# Patient Record
Sex: Female | Born: 1995 | Race: White | Hispanic: No | Marital: Married | Smoking: Never smoker
Health system: Southern US, Community
[De-identification: ages and names within clinical notes are randomized; demographics above are authoritative.]

## PROBLEM LIST (undated history)

## (undated) DIAGNOSIS — G909 Disorder of the autonomic nervous system, unspecified: Secondary | ICD-10-CM

## (undated) DIAGNOSIS — R161 Splenomegaly, not elsewhere classified: Secondary | ICD-10-CM

## (undated) DIAGNOSIS — F419 Anxiety disorder, unspecified: Secondary | ICD-10-CM

## (undated) DIAGNOSIS — O24419 Gestational diabetes mellitus in pregnancy, unspecified control: Secondary | ICD-10-CM

## (undated) DIAGNOSIS — I498 Other specified cardiac arrhythmias: Secondary | ICD-10-CM

## (undated) DIAGNOSIS — O139 Gestational [pregnancy-induced] hypertension without significant proteinuria, unspecified trimester: Secondary | ICD-10-CM

## (undated) DIAGNOSIS — R55 Syncope and collapse: Secondary | ICD-10-CM

## (undated) DIAGNOSIS — F5000 Anorexia nervosa, unspecified: Secondary | ICD-10-CM

## (undated) DIAGNOSIS — F32A Depression, unspecified: Secondary | ICD-10-CM

## (undated) DIAGNOSIS — R000 Tachycardia, unspecified: Secondary | ICD-10-CM

## (undated) DIAGNOSIS — E282 Polycystic ovarian syndrome: Secondary | ICD-10-CM

## (undated) DIAGNOSIS — N390 Urinary tract infection, site not specified: Secondary | ICD-10-CM

## (undated) DIAGNOSIS — G43909 Migraine, unspecified, not intractable, without status migrainosus: Secondary | ICD-10-CM

## (undated) DIAGNOSIS — I951 Orthostatic hypotension: Secondary | ICD-10-CM

## (undated) DIAGNOSIS — N200 Calculus of kidney: Secondary | ICD-10-CM

## (undated) DIAGNOSIS — F4323 Adjustment disorder with mixed anxiety and depressed mood: Secondary | ICD-10-CM

## (undated) DIAGNOSIS — G90A Postural orthostatic tachycardia syndrome (POTS): Secondary | ICD-10-CM

## (undated) HISTORY — DX: Migraine, unspecified, not intractable, without status migrainosus: G43.909

## (undated) HISTORY — DX: Other specified cardiac arrhythmias: I49.8

## (undated) HISTORY — DX: Orthostatic hypotension: I95.1

## (undated) HISTORY — DX: Syncope and collapse: R55

## (undated) HISTORY — PX: WISDOM TOOTH EXTRACTION: SHX21

## (undated) HISTORY — DX: Tachycardia, unspecified: R00.0

## (undated) HISTORY — DX: Postural orthostatic tachycardia syndrome (POTS): G90.A

---
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
## 2014-01-26 ENCOUNTER — Other Ambulatory Visit: Payer: Self-pay | Admitting: Family

## 2014-01-26 DIAGNOSIS — G43009 Migraine without aura, not intractable, without status migrainosus: Secondary | ICD-10-CM

## 2014-07-08 ENCOUNTER — Other Ambulatory Visit: Payer: Self-pay | Admitting: Family

## 2014-08-06 ENCOUNTER — Other Ambulatory Visit: Payer: Self-pay | Admitting: Family

## 2014-08-20 ENCOUNTER — Ambulatory Visit: Payer: BC Managed Care – PPO | Admitting: Pediatrics

## 2014-09-15 ENCOUNTER — Ambulatory Visit: Payer: Self-pay | Admitting: Pediatrics

## 2014-09-22 ENCOUNTER — Ambulatory Visit (INDEPENDENT_AMBULATORY_CARE_PROVIDER_SITE_OTHER): Payer: BLUE CROSS/BLUE SHIELD | Admitting: Pediatrics

## 2014-09-22 ENCOUNTER — Encounter: Payer: Self-pay | Admitting: Pediatrics

## 2014-09-22 ENCOUNTER — Other Ambulatory Visit: Payer: Self-pay

## 2014-09-22 VITALS — BP 110/72 | HR 64 | Ht 65.5 in | Wt 118.8 lb

## 2014-09-22 DIAGNOSIS — R55 Syncope and collapse: Secondary | ICD-10-CM

## 2014-09-22 DIAGNOSIS — R000 Tachycardia, unspecified: Secondary | ICD-10-CM

## 2014-09-22 DIAGNOSIS — G43009 Migraine without aura, not intractable, without status migrainosus: Secondary | ICD-10-CM | POA: Diagnosis not present

## 2014-09-22 DIAGNOSIS — G90A Postural orthostatic tachycardia syndrome (POTS): Secondary | ICD-10-CM

## 2014-09-22 DIAGNOSIS — I951 Orthostatic hypotension: Secondary | ICD-10-CM

## 2014-09-22 HISTORY — DX: Syncope and collapse: R55

## 2014-09-22 MED ORDER — TOPIRAMATE 25 MG PO TABS: ORAL_TABLET | ORAL | Status: DC

## 2014-09-22 NOTE — Progress Notes (Signed)
Patient: Jessica [name] MRN: [id number] Sex: female DOB: 04/20/1996  Provider: Deetta Perla, MD Location of Care: [facility]  Note type: Routine return visit  History of Present Illness: Referral Source: Dr. [name] History from: mother, patient and [facility] chart Chief Complaint: Migraines  Jessica [name] is a 19 y.o. female who returns on September 22, 2014 for the 1st time since December 31, 2013.  She is in her first year at [employer] and has done well.  She is followed for migraine without aura and vasovagal syncope with orthostatic intolerance that is similar to POTS.  She has been treated with a combination of hydration, exercise, elevation of the head of her bed, atenolol, midodrine, which has largely brought her symptoms under control.  In the interim since her last visit, she has experienced no migraines and no incapacitating headaches.  Her last episode of syncope occurred last week.  She was sitting in her room, got up from her chair and awakened on the floor.  The episode occurred without warning, which is distinctly unusual.  She hit her chin on the desk as she was falling.  She was by herself and so this was unwitnessed.  Previous episodes happened on New Year's Eve; before that January 24, 2014. Those were with warning.  She has abdominal discomfort that is crampy and sharp.  It involves the left and right periumbilical regions and persists for about an hour.  On occasion, she has nausea.  She changed her diet to gluten-free, which helped a bit, but has not eliminated her symptoms.  She also has problems with constipation.  She continues to take topiramate daily and has not required Imitrex as a rescue medication.  She has been off midodrine for a year.  The mainstays of her orthostatic treatment include fludrocortisone and atenolol.  She also takes a daily iron tablet.  She is in a pre-nursing program.  She may transfer from [employer] to [employer].  Overall despite her  medical problems, her health has been good.  No other concerns were raised today.  Review of Systems: 12 system review was unremarkable  Past Medical History Diagnosis Date  . Generalized headaches   . Orthostatic hypotension   . Other specified cardiac dysrhythmias(427.89)   . Episodic tension type headache    Hospitalizations: No., Head Injury: No., Nervous System Infections: No., Immunizations up to date: Yes.    She had onset of headaches, some of them migraines beginning in the 4th or 5th grade. These involve stabbing left-sided pain that lasts for an hour before easing. The episodes are recurrent and are associated with nausea and vomiting, left sided throbbing, no sensitivity to light, sound, or movement. She recently had at least one episode of 10-15 minutes of tingling in her face and fingers associated with headaches.   She also had episodes of what appeared to be positional vertigo, but became syncope. She was evaluated by Dr. Darlis Loan on April 08, 2012. He noted a previous evaluation, June 22, 2011 for syncope. EKG and echocardiogram were normal. 24-hour Holter monitor was normal. She had persistent episodes of tachycardia and dizziness following the evaluation. She had initial improvement with Florinef; however, on 0.3 mg per day, she did not have persistent improvement. She complained of three episodes of loss of consciousness per month. On March 04, 2012, she had an episode of stiffening which was unusual. A decision was made to place her on atenolol when her pulse went up from  68 to 105 from supine to standing.  Behavior History none  Surgical History Procedure Laterality Date  . Eye surgery  2000    clogged tear duct  . Anterior cruciate ligament repair  03/2010   Family History family history includes Mental retardation in her other; Migraines in her maternal aunt, maternal aunt, maternal uncle, maternal uncle, and mother; Other in her other; Seizures in her  father and mother; Stroke in her maternal grandmother. Family history is negative for intellectual disabilities, blindness, deafness, birth defects, chromosomal disorder, or autism.  Social History . Marital Status: Single    Spouse Name: N/A  . Number of Children: N/A  . Years of Education: N/A   Social History Main Topics  . Smoking status: Never Smoker   . Smokeless tobacco: Never Used  . Alcohol Use: No  . Drug Use: No  . Sexual Activity: No   Social History Narrative  Educational level university School Attending: [employer]  Occupation: Student  Living with [name] lives in a dorm with other students during the week. She goes home on the weekends.   Hobbies/Interest: She enjoys watching Netflix when she has spare time.  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 She is studying nursing, and is earning all A's.  Allergies Allergen Reactions  . Amoxicillin Rash   Physical Exam BP 110/72 mmHg  Pulse 64  Ht 5' 5.5" (1.664 m)  Wt 118 lb 12.8 oz (53.887 kg)  BMI 19.46 kg/m2  LMP 09/22/2014 (Exact Date)  General: alert, well developed, well nourished, in no acute distress, brown hair, brown eyes, right handed Head: normocephalic, no dysmorphic features Ears, Nose and Throat: Otoscopic: tympanic membranes normal; pharynx: oropharynx is pink without exudates or tonsillar hypertrophy Neck: supple, full range of motion, no cranial or cervical bruits Respiratory: auscultation clear Cardiovascular: no murmurs, pulses are normal Musculoskeletal: no skeletal deformities or apparent scoliosis Skin: no rashes or neurocutaneous lesions  Neurologic Exam  Mental Status: alert; oriented to person, place and year; knowledge is normal for age; language is normal Cranial Nerves: visual fields are full to double simultaneous stimuli; extraocular movements are full and conjugate; pupils are round reactive to light; funduscopic examination shows sharp disc margins with normal vessels; symmetric  facial strength; midline tongue and uvula; air conduction is greater than bone conduction bilaterally Motor: Normal strength, tone and mass; good fine motor movements; no pronator drift Sensory: intact responses to cold, vibration, proprioception and stereognosis Coordination: good finger-to-nose, rapid repetitive alternating movements and finger apposition Gait and Station: normal gait and station: patient is able to walk on heels, toes and tandem without difficulty; balance is adequate; Romberg exam is negative; Gower response is negative Reflexes: symmetric and diminished bilaterally; no clonus; bilateral flexor plantar responses  Assessment 1. Migraine without aura, not intractable, without status migrainosus, G43.009. 2. Postural orthostatic tachycardia syndrome, R00.0, I95.1 . 3. Syncope, R55.  Discussion The patient has been stable with occasional episodes.  The one that concerns me to see episode that occurred without warning.  This is distinctly different from her other episodes and if it recurs, further evaluation may be necessary including an EEG.  Plan She will continue topiramate for the next six months.  I will see her in followup at that time, we may decide to taper and discontinue topiramate over a period of several weeks.  If her headaches recur; however, she will remain on the medication.  Though she has experienced some episodes of syncope, I would not make change in  her treatment now.  She will return in six months for followup.  I spent 30 minutes of face-to-face time with Tiffny and her mother, more than half of it in consultation.   Medication List   This list is accurate as of: 09/22/14 11:59 PM.       atenolol 25 MG tablet  Commonly known as:  TENORMIN  1 tablet in the morning and one half tablet at nighttime     fludrocortisone 0.1 MG tablet  Commonly known as:  FLORINEF  Take 3 tablets by mouth daily     GILDESS FE 1/20 1-20 MG-MCG tablet  Generic drug:   norethindrone-ethinyl estradiol     ibuprofen 200 MG tablet  Commonly known as:  ADVIL,MOTRIN  Take 400 mg by mouth every 8 (eight) hours as needed. For headache     phenylephrine 10 MG Tabs tablet  Commonly known as:  SUDAFED PE  Take 10 mg by mouth 2 (two) times daily as needed. For sinus pressure.     SUMAtriptan 25 MG tablet  Commonly known as:  IMITREX  Take 25 mg by mouth. Taken onset of migraine with 400 mg of ibuprofen a repeat in 2 hours as needed.     topiramate 25 MG tablet  Commonly known as:  TOPAMAX  Take 3 tablets by mouth daily at bedtime.     ZYRTEC PO  Take 1 tablet by mouth daily.      The medication list was reviewed and reconciled. All changes or newly prescribed medications were explained.  A complete medication list was provided to the patient/caregiver.  Deetta Perla MD

## 2014-09-22 NOTE — Patient Instructions (Signed)
Please call our office if you have further episodes of syncope.

## 2014-09-26 DIAGNOSIS — G90A Postural orthostatic tachycardia syndrome (POTS): Secondary | ICD-10-CM | POA: Insufficient documentation

## 2014-09-26 DIAGNOSIS — R000 Tachycardia, unspecified: Secondary | ICD-10-CM

## 2014-09-26 DIAGNOSIS — I951 Orthostatic hypotension: Secondary | ICD-10-CM

## 2014-10-02 ENCOUNTER — Other Ambulatory Visit: Payer: Self-pay | Admitting: Family

## 2014-10-16 ENCOUNTER — Telehealth: Payer: Self-pay

## 2014-10-16 ENCOUNTER — Ambulatory Visit (INDEPENDENT_AMBULATORY_CARE_PROVIDER_SITE_OTHER): Payer: BLUE CROSS/BLUE SHIELD | Admitting: Urgent Care

## 2014-10-16 VITALS — BP 98/72 | HR 65 | Temp 98.0°F | Resp 16 | Ht 65.5 in | Wt 121.0 lb

## 2014-10-16 DIAGNOSIS — J029 Acute pharyngitis, unspecified: Secondary | ICD-10-CM

## 2014-10-16 DIAGNOSIS — R5383 Other fatigue: Secondary | ICD-10-CM

## 2014-10-16 DIAGNOSIS — R52 Pain, unspecified: Secondary | ICD-10-CM | POA: Diagnosis not present

## 2014-10-16 DIAGNOSIS — R509 Fever, unspecified: Secondary | ICD-10-CM

## 2014-10-16 LAB — POCT CBC
GRANULOCYTE PERCENT: 66.2 % (ref 37–80)
HCT, POC: 38.6 % (ref 37.7–47.9)
Hemoglobin: 11.8 g/dL — AB (ref 12.2–16.2)
Lymph, poc: 2.6 (ref 0.6–3.4)
MCH: 28.8 pg (ref 27–31.2)
MCHC: 30.7 g/dL — AB (ref 31.8–35.4)
MCV: 94 fL (ref 80–97)
MID (CBC): 0.4 (ref 0–0.9)
MPV: 8.6 fL (ref 0–99.8)
POC Granulocyte: 5.8 (ref 2–6.9)
POC LYMPH PERCENT: 29.4 %L (ref 10–50)
POC MID %: 4.4 % (ref 0–12)
Platelet Count, POC: 276 10*3/uL (ref 142–424)
RBC: 4.1 M/uL (ref 4.04–5.48)
RDW, POC: 12.6 %
WBC: 8.8 10*3/uL (ref 4.6–10.2)

## 2014-10-16 LAB — POCT RAPID STREP A (OFFICE): RAPID STREP A SCREEN: NEGATIVE

## 2014-10-16 NOTE — Patient Instructions (Addendum)
- I will call your mother with your results and plan for follow up. - For now continue using ibuprofen for fever, warmed honey for sore throat, Sudafed for nasal congestion, Zyrtec in case seasonal allergies flare up. Drink plenty of water, between 32-64 ounces daily.  Fever, Adult A fever is a higher than normal body temperature. In an adult, an oral temperature around 98.6 F (37 C) is considered normal. A temperature of 100.4 F (38 C) or higher is generally considered a fever. Mild or moderate fevers generally have no long-term effects and often do not require treatment. Extreme fever (greater than or equal to 106 F or 41.1 C) can cause seizures. The sweating that may occur with repeated or prolonged fever may cause dehydration. Elderly people can develop confusion during a fever. A measured temperature can vary with:  Age.  Time of day.  Method of measurement (mouth, underarm, rectal, or ear). The fever is confirmed by taking a temperature with a thermometer. Temperatures can be taken different ways. Some methods are accurate and some are not.  An oral temperature is used most commonly. Electronic thermometers are fast and accurate.  An ear temperature will only be accurate if the thermometer is positioned as recommended by the manufacturer.  A rectal temperature is accurate and done for those adults who have a condition where an oral temperature cannot be taken.  An underarm (axillary) temperature is not accurate and not recommended. Fever is a symptom, not a disease.  CAUSES   Infections commonly cause fever.  Some noninfectious causes for fever include:  Some arthritis conditions.  Some thyroid or adrenal gland conditions.  Some immune system conditions.  Some types of cancer.  A medicine reaction.  High doses of certain street drugs such as methamphetamine.  Dehydration.  Exposure to high outside or room temperatures.  Occasionally, the source of a fever  cannot be determined. This is sometimes called a "fever of unknown origin" (FUO).  Some situations may lead to a temporary rise in body temperature that may go away on its own. Examples are:  Childbirth.  Surgery.  Intense exercise. HOME CARE INSTRUCTIONS   Take appropriate medicines for fever. Follow dosing instructions carefully. If you use acetaminophen to reduce the fever, be careful to avoid taking other medicines that also contain acetaminophen. Do not take aspirin for a fever if you are younger than age 46. There is an association with Reye's syndrome. Reye's syndrome is a rare but potentially deadly disease.  If an infection is present and antibiotics have been prescribed, take them as directed. Finish them even if you start to feel better.  Rest as needed.  Maintain an adequate fluid intake. To prevent dehydration during an illness with prolonged or recurrent fever, you may need to drink extra fluid.Drink enough fluids to keep your urine clear or pale yellow.  Sponging or bathing with room temperature water may help reduce body temperature. Do not use ice water or alcohol sponge baths.  Dress comfortably, but do not over-bundle. SEEK MEDICAL CARE IF:   You are unable to keep fluids down.  You develop vomiting or diarrhea.  You are not feeling at least partly better after 3 days.  You develop new symptoms or problems. SEEK IMMEDIATE MEDICAL CARE IF:   You have shortness of breath or trouble breathing.  You develop excessive weakness.  You are dizzy or you faint.  You are extremely thirsty or you are making little or no urine.  You develop new  pain that was not there before (such as in the head, neck, chest, back, or abdomen).  You have persistent vomiting and diarrhea for more than 1 to 2 days.  You develop a stiff neck or your eyes become sensitive to light.  You develop a skin rash.  You have a fever or persistent symptoms for more than 2 to 3 days.  You  have a fever and your symptoms suddenly get worse. MAKE SURE YOU:   Understand these instructions.  Will watch your condition.  Will get help right away if you are not doing well or get worse. Document Released: 01/03/2001 Document Revised: 11/24/2013 Document Reviewed: 05/11/2011 [facility] Patient Information 2015 [employer] [place]. This information is not intended to replace advice given to you by your health care provider. Make sure you discuss any questions you have with your health care provider.

## 2014-10-16 NOTE — Telephone Encounter (Signed)
Jessica [name]   Mom called to say patient had been bitten by something the day before she had the fever.  It swelled up large as a quarter, then went back down.   Relayed the following verbal from HaringMani to mom.  If patient develops a rash, she needs to return to clinic.  Otherwise, wait for the culture.  She understands.

## 2014-10-16 NOTE — Progress Notes (Signed)
MRN: [id number] DOB: 1996/03/30  Subjective:   [name] is a 19 y.o. female presenting for chief complaint of Fever; Sore Throat; and Fatigue  Reports 1 week history of fever (101F), fatigue, malaise, intermittent headache, body aches; sore throat started yesterday. Has tried ibuprofen regularly with some relief, takes Sudafed as needed for congestion, has tried this intermittently with some relief. 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 Denies itchy or watery red eyes, sinus pain, sinus congestion, ear pain, ear drainage, tooth pain, cough, chest pain, chest tightness, shob, wheezing, n/v, abdominal pain. Has history of seasonal allergies, not currently taking Zyrtec. Denies history of asthma. Denies smoking or alcohol use. Denies any other aggravating or relieving factors, no other questions or concerns.  [name] has a current medication list which includes the following prescription(s): atenolol, fludrocortisone, gildess fe 1/20, ibuprofen, phenylephrine, sumatriptan, topiramate, cetirizine hcl, and topiramate. She is allergic to amoxicillin.  [name]  has a past medical history of Generalized headaches; Orthostatic hypotension; Other specified cardiac dysrhythmias(427.89); and Episodic tension type headache. Also  has past surgical history that includes Eye surgery (2000); Anterior cruciate ligament repair (03/2010); and Wisdom tooth extraction.  ROS As in subjective.  Objective:   Vitals: BP 98/72 mmHg  Pulse 65  Temp(Src) 98 F (36.7 C) (Oral)  Resp 16  Ht 5' 5.5" (1.664 m)  Wt 121 lb (54.885 kg)  BMI 19.82 kg/m2  SpO2 100%  LMP 09/22/2014  Physical Exam  Constitutional: She is oriented to person, place, and time and well-developed, well-nourished, and in no distress.  HENT:  TM's flat bilaterally, no effusions or erythema. Nasal turbinates pink and moist with slight clear rhinorrhea. No sinus tenderness. Slight postnasal drip present,  without oropharyngeal exudates, erythema, tonsillar enlargement or abscesses.  Eyes: Conjunctivae are normal. Right eye exhibits no discharge. Left eye exhibits no discharge. No scleral icterus.  Neck: Normal range of motion.  Cardiovascular: Normal rate, regular rhythm and intact distal pulses.  Exam reveals no gallop and no friction rub.   No murmur heard. Pulmonary/Chest: No stridor. No respiratory distress. She has no wheezes. She has no rales. She exhibits no tenderness.  Abdominal: Soft. Bowel sounds are normal. She exhibits no distension and no mass. There is no tenderness.  No hepatosplenomegaly.  Lymphadenopathy:    She has no cervical adenopathy.  Neurological: She is alert and oriented to person, place, and time.   Results for orders placed or performed in visit on 10/16/14 (from the past 24 hour(s))  POCT rapid strep A     Status: None   Collection Time: 10/16/14  4:26 PM  Result Value Ref Range   Rapid Strep A Screen Negative Negative  POCT CBC     Status: Abnormal   Collection Time: 10/16/14  4:37 PM  Result Value Ref Range   WBC 8.8 4.6 - 10.2 K/uL   Lymph, poc 2.6 0.6 - 3.4   POC LYMPH PERCENT 29.4 10 - 50 %L   MID (cbc) 0.4 0 - 0.9   POC MID % 4.4 0 - 12 %M   POC Granulocyte 5.8 2 - 6.9   Granulocyte percent 66.2 37 - 80 %G   RBC 4.10 4.04 - 5.48 M/uL   Hemoglobin 11.8 (A) 12.2 - 16.2 g/dL   HCT, POC 40.938.6 81.137.7 - 47.9 %   MCV 94.0 80 - 97 fL   MCH, POC 28.8 27 - 31.2 pg   MCHC 30.7 (A) 31.8 -  35.4 g/dL   RDW, POC 16.1 %   Platelet Count, POC 276 142 - 424 K/uL   MPV 8.6 0 - 99.8 fL   Assessment and Plan :   1. Fever, unspecified fever cause 2. Sore throat 3. Body aches 4. Other fatigue - Unclear etiology, labs pending, advised supportive care, patient would like to have her mother called with results and plan for follow up.  Wallis Bamberg, PA-C [facility] [phone] 10/16/2014 3:57 PM

## 2014-10-18 LAB — CULTURE, GROUP A STREP: Organism ID, Bacteria: NORMAL

## 2014-10-19 LAB — EPSTEIN-BARR VIRUS VCA ANTIBODY PANEL
EBV EA IGG: 6.4 U/mL (ref <9.0)
EBV NA IgG: <3 U/mL (ref <18.0)
EBV VCA IgG: 43.2 U/mL — ABNORMAL HIGH (ref <18.0)

## 2014-10-20 ENCOUNTER — Telehealth: Payer: Self-pay | Admitting: Urgent Care

## 2014-10-20 NOTE — Telephone Encounter (Signed)
Discussed case with Dr. Nilda SimmerKristi Smith. Will continue with NSAIDs, hydration as patient is likely undergoing viral syndrome. Advised to repeat labs in 2-3 days if symptoms persist.  Wallis BambergMario Deliana Avalos, PA-C [facility] [phone] 10/20/2014  2:59 PM

## 2014-10-20 NOTE — Telephone Encounter (Signed)
Reported negative Strep and mono results to patient's mother. Patient's mother states that patient is still having low grade fevers despite taking 400mg  of ibuprofen 6-8 hours apart. Temperature is hovering ~100F. Patient is also still having scratchy throat. Denies chest pain, shob, wheezing, n/v, abdominal pain, throat pain. Has a history of seasonal allergies, taking Zyrtec daily for this. 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 Has been stable as of late. Also, reports that patient had an insect bite about 2 weeks ago. This has since resolved without any rashes.  Jessica BambergMario Alsie Younes, PA-C [facility] [phone] 10/20/2014  1:59 PM

## 2014-10-22 ENCOUNTER — Other Ambulatory Visit: Payer: Self-pay | Admitting: Pediatrics

## 2014-10-22 ENCOUNTER — Telehealth: Payer: Self-pay | Admitting: Family

## 2014-10-22 NOTE — Telephone Encounter (Signed)
Mom Bonnita LevanJoy Thomas left message about Kiwanna. Mom said that she is having problems with lingering headache for 2 weeks, but headache didn't feel like a migraine until today. She has been sick and been taking Motrin every day for viral illness with fever and upper respiratory symptoms for past 2 weeks. Mom said that [name] is in college and has been pushing herself to go to class and do work. Mom wondered if she is now "immune" to the Motrin since she has been taking it for a week and wonders if trying Tylenol would give her relief of headache. She has Sumatriptan but can't take it if she has to study because it makes her tired and sleepy.  Mom asked for call back at [phone]. I called Mom and talked with her about Cataleya's headache. I explained that since she has had headache and has been taking Motrin for 2 weeks, that more oral analgesics are not likely to help. I told her that Shonita should go to ER to receive migraine cocktail to try to break the headache if the headache continues at migraine level today with no relief. Mom agreed with this and will talk to Botswanaiara about coming home to parents where she can get more rest (Enyah lives in dorm at [employer]) while headache is severe and talk to her about considering going to ER. TG

## 2014-10-23 NOTE — Telephone Encounter (Signed)
Thank you, I agree with this advice and plan.

## 2014-10-26 ENCOUNTER — Encounter (HOSPITAL_BASED_OUTPATIENT_CLINIC_OR_DEPARTMENT_OTHER): Payer: Self-pay

## 2014-10-26 ENCOUNTER — Emergency Department (HOSPITAL_BASED_OUTPATIENT_CLINIC_OR_DEPARTMENT_OTHER)
Admission: EM | Admit: 2014-10-26 | Discharge: 2014-10-26 | Disposition: A | Discharge status: Home / Self Care | Payer: BLUE CROSS/BLUE SHIELD | Attending: Emergency Medicine | Admitting: Emergency Medicine

## 2014-10-26 DIAGNOSIS — Z7951 Long term (current) use of inhaled steroids: Secondary | ICD-10-CM | POA: Diagnosis not present

## 2014-10-26 DIAGNOSIS — Z880 Allergy status to penicillin: Secondary | ICD-10-CM | POA: Insufficient documentation

## 2014-10-26 DIAGNOSIS — G43901 Migraine, unspecified, not intractable, with status migrainosus: Secondary | ICD-10-CM | POA: Diagnosis not present

## 2014-10-26 DIAGNOSIS — Z8679 Personal history of other diseases of the circulatory system: Secondary | ICD-10-CM | POA: Insufficient documentation

## 2014-10-26 DIAGNOSIS — Z79899 Other long term (current) drug therapy: Secondary | ICD-10-CM | POA: Insufficient documentation

## 2014-10-26 MED ORDER — SODIUM CHLORIDE 0.9 % IV BOLUS (SEPSIS)
2000.0000 mL | Freq: Once | INTRAVENOUS | Status: AC
  Administered 2014-10-26: 1000 mL via INTRAVENOUS

## 2014-10-26 MED ORDER — KETOROLAC TROMETHAMINE 30 MG/ML IJ SOLN
30.0000 mg | Freq: Once | INTRAMUSCULAR | Status: AC
  Administered 2014-10-26: 30 mg via INTRAVENOUS
  Filled 2014-10-26: qty 1

## 2014-10-26 MED ORDER — DIPHENHYDRAMINE HCL 50 MG/ML IJ SOLN
25.0000 mg | Freq: Once | INTRAMUSCULAR | Status: AC
  Administered 2014-10-26: 25 mg via INTRAVENOUS
  Filled 2014-10-26: qty 1

## 2014-10-26 MED ORDER — DEXAMETHASONE SODIUM PHOSPHATE 10 MG/ML IJ SOLN
10.0000 mg | Freq: Once | INTRAMUSCULAR | Status: AC
  Administered 2014-10-26: 10 mg via INTRAVENOUS
  Filled 2014-10-26: qty 1

## 2014-10-26 MED ORDER — PROCHLORPERAZINE EDISYLATE 5 MG/ML IJ SOLN
10.0000 mg | Freq: Once | INTRAMUSCULAR | Status: AC
  Administered 2014-10-26: 10 mg via INTRAVENOUS
  Filled 2014-10-26: qty 2

## 2014-10-26 NOTE — ED Provider Notes (Signed)
CSN: [id number]     Arrival date & time 10/26/14  1520 History   First MD Initiated Contact with Patient 10/26/14 1612     Chief Complaint  Patient presents with  . Migraine     (Consider location/radiation/quality/duration/timing/severity/associated sxs/prior Treatment) HPI Patient presents to the emergency department with migraine headache for the last 2 weeks.  Patient states this feels similar to previous migraines, but has lasted longer than normal.  Patient states that he did get some better this past Friday began worse over the weekend.  She spoke with her neurologist, who advised her to come to the emergency department.  Patient denies blurred vision, weakness, numbness, dizziness, back pain, neck pain, cough, runny nose, sore throat, chest pain, shortness of breath, fever, rash, near syncope or syncope.  The patient states that she did have a viral illness just prior to the onset of a migraine headache Past Medical History  Diagnosis Date  . Generalized headaches   . Orthostatic hypotension   . Other specified cardiac dysrhythmias(427.89)   . Episodic tension type headache    Past Surgical History  Procedure Laterality Date  . Eye surgery  2000    clogged tear duct  . Anterior cruciate ligament repair  03/2010  . Wisdom tooth extraction     Family History  Problem Relation Age of Onset  . Migraines Mother     Started 4th or 5th grade  . Seizures Mother     Febrile Seizures as a child  . Seizures Father     Febrile Seizures as a child  . Migraines Maternal Aunt   . Migraines Maternal Uncle   . Migraines Maternal Aunt   . Migraines Maternal Uncle   . Mental retardation Other     Maternal Second Cousin  . Other Other     Maternal Great Uncle had some sort of Retinal Vessel Occlusion  . Stroke Maternal Grandmother     mini-stroke   History  Substance Use Topics  . Smoking status: Never Smoker   . Smokeless tobacco: Never Used  . Alcohol Use: No   OB History     No data available     Review of Systems  All other systems negative except as documented in the HPI. All pertinent positives and negatives as reviewed in the HPI.  Allergies  Amoxicillin  Home Medications   Prior to Admission medications   Medication Sig Start Date End Date Taking? Authorizing Provider  atenolol (TENORMIN) 25 MG tablet 1 tablet in the morning and one half tablet at nighttime 10/04/12   Historical Provider, MD  Cetirizine HCl (ZYRTEC PO) Take 1 tablet by mouth daily.      Historical Provider, MD  fludrocortisone (FLORINEF) 0.1 MG tablet Take 3 tablets by mouth daily 07/22/13   Princella Ionina P Goodpasture, NP  GILDESS FE 1/20 1-20 MG-MCG tablet  09/21/14   Historical Provider, MD  ibuprofen (ADVIL,MOTRIN) 200 MG tablet Take 400 mg by mouth every 8 (eight) hours as needed. For headache     Historical Provider, MD  phenylephrine (SUDAFED PE) 10 MG TABS Take 10 mg by mouth 2 (two) times daily as needed. For sinus pressure.     Historical Provider, MD  SUMAtriptan (IMITREX) 25 MG tablet TAKE ONE AT ONSET OF MIGRAINE WITH 400 MG OF IBUPROFEN 10/22/14   Princella Ionina P Goodpasture, NP  topiramate (TOPAMAX) 25 MG tablet Take 3 tablets by mouth daily at bedtime. 09/22/14   Deetta PerlaWilliam H Hickling, MD  topiramate (TOPAMAX) 25  MG tablet TAKE 3 TABLETS BY MOUTH DAILY AT BEDTIME. 10/02/14   Shirlean Mylar Goodpasture, NP   BP 113/73 mmHg  Pulse 69  Temp(Src) 98.3 F (36.8 C) (Oral)  Resp 18  Ht  (1.651 m)  Wt 120 lb (54.432 kg)  BMI 19.97 kg/m2  SpO2 100%  LMP 10/19/2014 Physical Exam  Constitutional: She is oriented to person, place, and time. She appears well-developed and well-nourished. No distress.  HENT:  Head: Normocephalic and atraumatic.  Mouth/Throat: Oropharynx is clear and moist.  Eyes: Pupils are equal, round, and reactive to light.  Neck: Normal range of motion. Neck supple.  Cardiovascular: Normal rate, regular rhythm and normal heart sounds.  Exam reveals no gallop and no friction rub.    No murmur heard. Pulmonary/Chest: Effort normal and breath sounds normal. No respiratory distress.  Neurological: She is alert and oriented to person, place, and time. She exhibits normal muscle tone. Coordination normal.  Skin: Skin is warm and dry. No rash noted. No erythema.  Nursing note and vitals reviewed.   ED Course  Procedures (including critical care time)  Patient is feeling better following IV fluids, Toradol, Decadron, Compazine and Benadryl.  The patient states her headache is completely gone but is most of the way gone and feels better than it has in 2 weeks.  I advised her to call her neurologist for recheck as soon as possible.  Return here as needed  Charlestine Night, PA-C 10/26/14 1954  Rolan Bucco, MD 10/27/14 [phone]

## 2014-10-26 NOTE — ED Notes (Signed)
PA at bedside.

## 2014-10-26 NOTE — ED Notes (Signed)
Second bag of IV  NS going to make 2000 of NS total

## 2014-10-26 NOTE — ED Notes (Signed)
C/o migraine x 2 weeks 

## 2014-10-26 NOTE — Discharge Instructions (Signed)
Return here as needed.  Follow-up with your neurologist increase her fluid intake

## 2015-02-22 ENCOUNTER — Telehealth: Payer: Self-pay | Admitting: Pediatrics

## 2015-02-22 NOTE — Telephone Encounter (Signed)
Jessica [name] is having increasing frequency of migraines although she has not sent any headache calendars.  There is some concern about eating disorder.  Please offer her an appointment on Thursday.  Give her one of the 45 minute new patient slots in the morning.

## 2015-02-23 NOTE — Telephone Encounter (Signed)
I spoke with Ander Slade the patients mom and she accepted appointment on 02/25/15 at 9:00 am with an 8:45 am arrival time. MB

## 2015-02-25 ENCOUNTER — Encounter: Payer: Self-pay | Admitting: *Deleted

## 2015-02-25 ENCOUNTER — Encounter: Payer: Self-pay | Admitting: Pediatrics

## 2015-02-25 ENCOUNTER — Ambulatory Visit (INDEPENDENT_AMBULATORY_CARE_PROVIDER_SITE_OTHER): Payer: BLUE CROSS/BLUE SHIELD | Admitting: Pediatrics

## 2015-02-25 ENCOUNTER — Encounter: Payer: BLUE CROSS/BLUE SHIELD | Attending: Pediatrics | Admitting: *Deleted

## 2015-02-25 VITALS — Ht 65.25 in | Wt 118.8 lb

## 2015-02-25 VITALS — BP 90/70 | HR 72 | Ht 65.25 in | Wt 118.8 lb

## 2015-02-25 DIAGNOSIS — G43009 Migraine without aura, not intractable, without status migrainosus: Secondary | ICD-10-CM

## 2015-02-25 DIAGNOSIS — F509 Eating disorder, unspecified: Secondary | ICD-10-CM | POA: Diagnosis present

## 2015-02-25 DIAGNOSIS — R000 Tachycardia, unspecified: Secondary | ICD-10-CM | POA: Diagnosis not present

## 2015-02-25 DIAGNOSIS — Z713 Dietary counseling and surveillance: Secondary | ICD-10-CM | POA: Diagnosis not present

## 2015-02-25 DIAGNOSIS — I951 Orthostatic hypotension: Secondary | ICD-10-CM

## 2015-02-25 DIAGNOSIS — G44219 Episodic tension-type headache, not intractable: Secondary | ICD-10-CM | POA: Diagnosis not present

## 2015-02-25 DIAGNOSIS — G90A Postural orthostatic tachycardia syndrome (POTS): Secondary | ICD-10-CM

## 2015-02-25 MED ORDER — TOPIRAMATE ER 100 MG PO SPRINKLE CAP24: EXTENDED_RELEASE_CAPSULE | ORAL | Status: DC

## 2015-02-25 NOTE — Progress Notes (Signed)
Patient: Jessica [name] MRN: [id number] Sex: female DOB: 1996-04-16  Provider: Deetta Perla, MD Location of Care: [facility]  Note type: Urgent return visit  History of Present Illness: Referral Source: Dr. [name] History from: mother, patient and [facility] chart Chief Complaint: Migraines   Jessica [name] is a 19 y.o. female who returns on February 25, 2015, for the first time since September 22, 2014.  She has just completed her freshman year at [employer].  She has migraine without aura, episodic tension-type headaches, vasovagal syncope with orthostatic intolerance similar to POTS.  On her last visit, she had experienced no migraines over nine months' period.  She had an episode of syncope the week before her visit.  There had only been three in a year.  Following that visit, there was a radical change in the frequency and severity of her headaches that I was totally unaware of because she did not send headache calendars.    Beginning on October 05, 2014, 17 days were reported: there were three tension headaches that required treatment and 15 migraines, 8 that were severe.  In April 2016, there was one day that was headache free, 18 days of tension headaches, 10 required treatment, and 11 migraines, 3 severe.  In May 2016, there was one day headache-free, 20 tension headaches, 11 required treatment, and 10 migraines, 5 severe.  In June 2016, there were five days that were headache-free, 17 days of tension headaches, 9 required treatment, and 8 migraines, 4 of them severe.  In July 2016, there were two days that were headache-free, 19 days of tension headaches, 12 required treatment, and 10 days of migraines, 3 severe.  In August 2016, so far there been three tension headaches, none of which required treatment.  In addition to this, Jessica [name] has developed problems with her body image, which has led her to purging.  She confided this to her mother who brought her to see Dr. [name],  her primary physician.  It is interesting to note that since March 2016, her weight has increased 0.8 pounds.  This is not the weight loss that one would expect in a situation where a young person thought she needed to lose weight.  Jessica [name]'s BMI is 19.62.    She had difficulty in her first year in college, not academically (straight A's), but with a roommate who often would come in quite late.  In the second semester, it was not uncommon for her to come home in the early morning hours just so that she could go to sleep.  She was stressed, anxious, and this undoubtedly added to her headaches.  However, headaches appear to have continued even though she has been out of school for nearly three months.    She is consistently taking topiramate.  She uses sumatriptan for at least some of her headaches with benefit.  Many of her headaches begin on awakening and can last much of the day.  She was able to struggle through class in the activities that were necessary.  Her typical bedtime is 10:30 p.m. to 11:30 p.m. and she gets up around 7 a.m.  This has been her schedule during the summer when she has taken some classes.  She is planning to major in nursing and will not be able to apply that to the school nursing until after the first trimester of her sophomore year.  She had some problems with lightheadedness, but as best I know there have been no  episodes of syncope since March 2016.  In general, her health has been good.  She has not complained of any side effects of topiramate.  Review of Systems: 12 system review was remarkable for headaches   Past Medical History Diagnosis Date  . Generalized headaches   . Orthostatic hypotension   . Other specified cardiac dysrhythmias(427.89)   . Episodic tension type headache    Hospitalizations: No., Head Injury: No., Nervous System Infections: No., Immunizations up to date: Yes.    Evaluated by Dr. Darlis Loan on April 08, 2012. He noted a previous  evaluation, June 22, 2011 for syncope. EKG and echocardiogram were normal. 24-hour Holter monitor was normal. She had persistent episodes of tachycardia and dizziness following the evaluation. She had initial improvement with Florinef; however, on 0.3 mg per day, she did not have persistent improvement. She complained of three episodes of loss of consciousness per month. On March 04, 2012, she had an episode of stiffening which was unusual. A decision was made to place her on atenolol when her pulse went up from 68 to 105 from supine to standing.  ER visit on 10/26/14 due to migraine.   Behavior History anxiety, issues with body image  Surgical History Procedure Laterality Date  . Eye surgery  2000    clogged tear duct  . Anterior cruciate ligament repair  03/2010  . Wisdom tooth extraction     Family History family history includes Cancer in her maternal grandfather; Mental retardation in her other; Migraines in her maternal aunt, maternal aunt, maternal uncle, maternal uncle, and mother; Other in her other; Seizures in her father and mother; Stroke in her maternal grandmother. Family history is negative for blindness, deafness, birth defects, chromosomal disorder, or autism.  Social History  . Marital Status: Single    Spouse Name: N/A  . Number of Children: N/A  . Years of Education: N/A   Social History Main Topics  . Smoking status: Never Smoker   . Smokeless tobacco: Never Used  . Alcohol Use: No  . Drug Use: No  . Sexual Activity: No   Social History Narrative   Educational level university School Attending: [employer]  Occupation: Student  Living with parents and siblings   Hobbies/Interest: Enjoys going to the pool.   School comments Jessica [name] is pursuing a degree in Nursing, she's doing very well she made all A's.  Allergies Allergen Reactions  . Amoxicillin Rash   Physical Exam BP 90/70 mmHg  Pulse 72  Ht 5' 5.25" (1.657 m)  Wt 118 lb 12.8 oz (53.887 kg)  BMI  19.63 kg/m2  LMP 01/12/2015 (Exact Date)  General: alert, well developed,thin, well nourished, in no acute distress, brown hair, brown eyes, right handed Head: normocephalic, no dysmorphic features Ears, Nose and Throat: Otoscopic: tympanic membranes normal; pharynx: oropharynx is pink without exudates or tonsillar hypertrophy Neck: supple, full range of motion, no cranial or cervical bruits Respiratory: auscultation clear Cardiovascular: no murmurs, pulses are normal Musculoskeletal: no skeletal deformities or apparent scoliosis Skin: no rashes or neurocutaneous lesions  Neurologic Exam  Mental Status: alert; oriented to person, place and year; knowledge is normal for age; language is normal Cranial Nerves: visual fields are full to double simultaneous stimuli; extraocular movements are full and conjugate; pupils are round reactive to light; funduscopic examination shows sharp disc margins with normal vessels; symmetric facial strength; midline tongue and uvula; air conduction is greater than bone conduction bilaterally Motor: Normal strength, tone and mass; good fine motor movements; no  pronator drift Sensory: intact responses to cold, vibration, proprioception and stereognosis Coordination: good finger-to-nose, rapid repetitive alternating movements and finger apposition Gait and Station: normal gait and station: patient is able to walk on heels, toes and tandem without difficulty; balance is adequate; Romberg exam is negative; Gower response is negative Reflexes: symmetric and diminished bilaterally; no clonus; bilateral flexor plantar responses  Assessment 1. Migraine without aura and without status migrainosus, not intractable, G43.009. 2. Episodic tension-type headache, not intractable, G44.219. 3. Postural orthostatic tachycardia syndrome, R0.0, I95.1.  Discussion I raised concerns to Jessica [name] about her failure to send calendars to me when she was doing so poorly.  I told her that we  need to find a way to communicate this fall so that if she continues to do poorly, we can adjust her medication to see if we can improve the frequency and severity of her migraines. (My Chart may prove to be useful.) I suggested increasing topiramate to 100 mg.  I do this knowing full well that this is a medicine that can decrease appetite and may play into the problem that she has with body image.  On the other hand, I do not think that she will allow me to start her on Depakote when there is a possibility that she could gain weight.  She is already taking atenolol for her orthostatic tachycardia so that the use of higher doses of atenolol, or propranolol is not appropriate.  Similarly, it would be difficult to consider use of the medicine verapamil even though it is a weak antihypertensive.  This leaves [place] with considering treatments like amitriptyline and levetiracetam.  The former is a very reasonable choice.  The latter is used on occasion with limited success.  Another medication similar to topiramate is zonisamide, which could also be considered.  It is my understanding that Jessica [name] is going to receive counseling to help deal with the body image issue.  She became somewhat tearful when we discussed this.  I told her that I understood her belief that she needed to lose weight, but that I did not see the same girl that she sees when she looks in the mirror.  Again after discussing this case with Dr. Rana Snare, it is clear that there has not been a steady pattern of weight loss as determined by weights both in her office and mine.  Plan I gave her a prescription for a sample of Quedexy XR 100 mg.  It is my hope that this medicine will be more evenly distributed throughout her day and may contribute to improved control of her migraines.  I also hope that she will not have significant problems with anorexia or interfere with her cognitive abilities.  I asked her to keep her headache calendar, to send it, and to  return to see me in two months' time.  I spent 45 minutes of face-to-face time with Jessica [name] and her mother, more than half of it in consultation.   Medication List   This list is accurate as of: 02/25/15 10:24 AM.       atenolol 25 MG tablet  Commonly known as:  TENORMIN  1 tablet in the morning     fludrocortisone 0.1 MG tablet  Commonly known as:  FLORINEF  Take 3 tablets by mouth daily     GILDESS FE 1/20 1-20 MG-MCG tablet  Generic drug:  norethindrone-ethinyl estradiol     ibuprofen 200 MG tablet  Commonly known as:  ADVIL,MOTRIN  Take 400 mg by mouth  every 8 (eight) hours as needed. For headache     phenylephrine 10 MG Tabs tablet  Commonly known as:  SUDAFED PE  Take 10 mg by mouth 2 (two) times daily as needed. For sinus pressure.     SUMAtriptan 25 MG tablet  Commonly known as:  IMITREX  TAKE ONE AT ONSET OF MIGRAINE WITH 400 MG OF IBUPROFEN     topiramate 25 MG tablet  Commonly known as:  TOPAMAX  Take 3 tablets by mouth daily at bedtime.     Topiramate ER 100 MG Cs24  Commonly known as:  QUDEXY XR  One tablet at nighttime      The medication list was reviewed and reconciled. All changes or newly prescribed medications were explained.  A complete medication list was provided to the patient/caregiver.  Deetta Perla MD

## 2015-02-25 NOTE — Progress Notes (Signed)
Appointment start time: 1600  Appointment end time: 1700  Patient was seen on 02/25/15 for nutrition counseling pertaining to disordered eating.  She is accompanied by her mother  Primary care provider: Dr. Rana Snare Therapist: looking for therapist Any other medical team members: October 14 appointment with Dr. Marina Goodell Parents: Jessica [name]  Assessment:  This is Jessica [name]'s initial nutrition visit.  She was referred by her PCP, Dr. Rana Snare.  When asked why she was referred, she states, "becuase I don't eat enough." States she is scared to eat, feels sick when she does eat (this has been going on awhile.  Went gluten-free last October, but that didn't help, still is gluten-free).  Dr. Rana Snare is managing that; has not been to see GI specialist.   Started restricting last August.  Got sick with POTS and lost a lot of weight and started gaining it back, felt like she needed to maintain the weight loss.  During school year living on [place] started skipping meals and when she came home for the summer, she ate more and started purging (this summer).   Wants to be pediatric nurse.  Studying at [employer], going to live at home this coming semester and commute.    Growth Metrics: Ideal BMI for age: 38.6 BMI today: 19.7 % Ideal today:  91% Previous growth data: weight/age  52-90th%; height/age at 75%; BMI/age 58-85% Goal BMI range based on growth chart data: 22-25 Goal weight range based on growth chart data: 130-145 lb Goal rate of weight gain:  0.5 lb/week  Eating history: Length of time: ~1 year Previous treatments: none Goals for RD meetings: not sure  Weight history:  Highest weight: ~128 lb  Lowest (adult) weight: ~112 lb How has weight changed in the past year: maintained essentially; had lost 16 pounds year before (POTS diagnosis)  Medical Information:  Changes in hair, skin, nails since ED started: thinks hair is thinning Chewing/swallowing difficulties: denies  Relux or heartburn: denies Trouble with  teeth: denies- needs dental visit LMP without the use of hormones: July 2015  Weight at that point: unsure, growth charts from that time indicate ~118 lb??  *Was prescribed OCP to "regulate her cycles"  Cycles are still irregular.  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   Dietary assessment: mom says she's always been picky A typical day consists of 2 eating occasions, not enough to be qualified as meals  Safe foods include: salad with minimal dressing, fruits, oatmeal, rice chex Avoided foods include:desserts, meat, pasta, fast food, chips, pizza, bread, all other potatoes besides baked  24 hour recall:  B: Oatmeal with strawberries and banana L: skipped 100% Juice with Miralax D: black bean burger patty with ketchup, fried okra  Today Granola bar (1/2) Coffee with cream and sugar Gluten-free monkey bread   What Methods Do You Use To Control Your Weight (Compensatory behaviors)?           Restricting (calories, fat, carbs): doesn't stick to specific number, but eats ~ 500 kcal/day  SIV: 2-6 times/week  Diet pills:denies  Laxatives: for constipation relief, denies use for weight loss  Diuretics: denies  Alcohol or drugs: denies  Exercise (what type): none  Food rules or rituals (explain)  Binge: denies   Administered EAT-26 Score significant >20 Patient score: 24  Terrified of gaining weight, feels extremely guilty after eating  Estimated energy intake: 500 kcal  Estimated energy needs: 2200 kcal for weight restoration    275 g CHO 110 g pro 73 g fat  Nutrition Diagnosis: NI-1.4 Inadequate energy  intake As related to restricting and purging.  As evidenced by weight loss of 16 pounds .  Intervention/Goals: Discussed what happens when i don't eat and how her symptoms will be improved with adequate nutrition.  Explained role of nutrition therapist.  Recommended various ED therapists as those PCP suggested don't accept insurance.  Need to increase Jessica [name]'s intake.  Proposed meal plan to provide: ~[phone] kcal  B: 1/2 oatmeal S: other half of oatmeal L: 1 cup fruit salad S: whole granola bar or whole trail mix sleeve or 1 cup trail mix D: 4 piece nuggets with fruit; full salad from [employer]; veggie patty with steamed broccoli S: chex with regular milk or soy  Add multivitamin and calcium (try Viactiv)  No physical activity    Monitoring and Evaluation: Patient will follow up in 2 weeks.

## 2015-02-25 NOTE — Patient Instructions (Addendum)
B: 1/2 oatmeal S: other half of oatmeal L: 1 cup fruit salad S: whole granola bar or whole trail mix sleeve or 1 cup trail mix D: 4 piece nuggets with fruit; full salad from [employer]; veggie patty with steamed broccoli S: chex with regular milk or soy  Add multivitamin and calcium (try Viactiv)  No physical activity

## 2015-02-25 NOTE — Patient Instructions (Signed)
You have done a good job of keeping your headache calendar, now send it the end of each month.  After vacation increased to 100 mg a day with Qudexy.  Let me know how this works.  I'm pleased that you're going to start speaking with someone about your body image and weight. Please sign up with My Chart and communicate with me through that.

## 2015-03-08 ENCOUNTER — Encounter: Payer: BLUE CROSS/BLUE SHIELD | Admitting: *Deleted

## 2015-03-08 VITALS — Ht 65.25 in | Wt 121.0 lb

## 2015-03-08 DIAGNOSIS — F509 Eating disorder, unspecified: Secondary | ICD-10-CM

## 2015-03-08 NOTE — Progress Notes (Signed)
Appointment start time: 1015  Appointment end time: 1100  Patient was seen on 03/08/15 for nutrition counseling pertaining to disordered eating.  She is accompanied by her mother  Primary care provider: Dr. Rana Snare Therapist: looking for therapist.   Any other medical team members: October 14 appointment with Dr. Marina Goodell Parents: [name]  Assessment:    The family is still looking for a therapist.  They just got back from being at the beach and haven't really hard time to start that search.  Contacted Lubertha Basque, but have decided not to pursue her at this time due to the fact she does not Hydrologist.  This provider gave some recommendations last visit.  Thinks she didn't eat well while on vaccation.  Her family wanted to eat more and that was uncomfortable for her. Thinks she still ate "enough" , but wasn't able to follow meal plan.  Since she's been back the past day or so, she has not started following the meal plan.  She still follows a gluten-free diet without specific diagnostic testing indicating a reason to do so.  Goes to see dr Rana Snare tomorrow.  Became emotional in session today and was not able to explain why.  Is having high anxiety with regards to food.  Seems to have increased her calories by ~100/day   Growth Metrics: Ideal BMI for age: 55.6 BMI today: 20.0 % Ideal today:  92% Previous growth data: weight/age  46-90th%; height/age at 75%; BMI/age 41-85% Goal BMI range based on growth chart data: 22-25 Goal weight range based on growth chart data: 130-145 lb Goal rate of weight gain:  0.5 lb/week   Weight history:  Highest weight: ~128 lb  Lowest (adult) weight: ~112 lb How has weight changed in the past year: maintained essentially; had lost 16 pounds year before (POTS diagnosis)  Medical Information:  Changes in hair, skin, nails since ED started: thinks hair is thinning Chewing/swallowing difficulties: denies  Relux or heartburn: denies Trouble with teeth: denies-  needs dental visit LMP without the use of hormones: July 2015  Weight at that point: unsure, growth charts from that time indicate ~118 lb??  *Was prescribed OCP to "regulate her cycles"  Cycles are still irregular.  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 Few tortilla chips S: baked oatmeal with nuts   Thinks this was more than enough food.  Didn't "need the chips or oatmeal"  What Methods Do You Use To Control Your Weight (Compensatory behaviors)?           Restricting (calories, fat, carbs): doesn't stick to specific number, but eats ~ 800-900 kcal/day  SIV: 2-6 times/week    Estimated energy intake:  825 kcal  Estimated energy needs: 2200 kcal for weight restoration    275 g CHO 110 g pro 73 g fat  Nutrition Diagnosis: NI-1.4 Inadequate energy intake As related to restricting and purging.  As evidenced by weight loss of 16 pounds .  Intervention/Goals:  Suggested talking with Dr. Rana Snare about antianxiety medication.  Also recommended discussing referral to GI to see if a gluten-free diet is necessary  (or instead is fodder for her eating disorder). Discussed what happens when i don't eat and how her symptoms will be improved with adequate nutrition.  Focused on how improved nutrition will improve mental health and cognitive functioning.  Keona agrees she is "mentally full"  Not "physically full".  Explained how a starbed brain doesn't work well and can't be trusted to make rational decisions, especially related to food.  Discussed GI adaptation to starvation and how that can be tricky when refeeding.  Still need to eat appropriately  even when she doesn't feel hungry.  Discussed strategies for eating when in school.  Discussed how living with eating disorder affects her life and how eating will restore her quality of life.  "Food is medicine."   Meal plan (to provide ~1000 kcal) B: 1/2 oatmeal S: other half of oatmeal or Boost/Ensure/CIB or greek yogurt or full smoothie L: 1 cup fruit salad or 1 cup chicken salad S: whole granola bar or whole trail mix sleeve or 1 cup trail mix; fruit with nuts or cheese D: 4 piece nuggets with fruit; full salad from [employer]; veggie patty with steamed broccoli with 7-8 tortilla chips or corn or tortilla S: chex with regular milk or soy or baked oatmeal      Monitoring and Evaluation: Patient will follow up in 2 weeks.

## 2015-03-08 NOTE — Patient Instructions (Signed)
Meal plan sample: B: 1/2 oatmeal S: other half of oatmeal L: 1 cup fruit salad S: whole granola bar or whole trail mix sleeve or 1 cup trail mix D: 4 piece nuggets with fruit; full salad from [employer]; veggie patty with steamed broccoli and fruit or 8 tortilla chips or corn torillas S: chex with regular milk or soy or baked oatmeal

## 2015-03-16 ENCOUNTER — Encounter: Payer: BLUE CROSS/BLUE SHIELD | Admitting: *Deleted

## 2015-03-16 VITALS — Ht 65.25 in | Wt 120.0 lb

## 2015-03-16 DIAGNOSIS — F509 Eating disorder, unspecified: Secondary | ICD-10-CM | POA: Diagnosis not present

## 2015-03-16 DIAGNOSIS — F5000 Anorexia nervosa, unspecified: Secondary | ICD-10-CM

## 2015-03-16 NOTE — Progress Notes (Addendum)
Appointment start time: 1500  Appointment end time: 1545  Patient was seen on 03/16/15 for nutrition counseling pertaining to disordered eating.  She is accompanied by her mother  Primary care provider: Dr. Rana Snare Therapist: looking for therapist.   Any other medical team members: October 14 appointment with Dr. Marina Goodell Parents: [name]  Assessment:    Jessica [name] states she has been eating 5 times most days. She also switched from almond milk to soy milk.  Family continues regular visits with Dr. Rana Snare as Jessica [name]'s appointment with Dr. Marina Goodell is not until 05/07/15.  Jessica [name] is aware of her weight as her weight checks at Dr. Vance Gather office are not blinded.  Family reports Jessica [name] has been prescribed an increased dose of hormones in her OCPs.  This provider had previously discussed with family that OCP use in anorexia does not appear to be supported in the literature, and advised to discuss this matter with medical provider  Growth Metrics: Ideal BMI for age: 2.6 BMI today: 20.0 % Ideal today:  92% Previous growth data: weight/age  60-90th%; height/age at 75%; BMI/age 44-85% Goal BMI range based on growth chart data: 22-25 Goal weight range based on growth chart data: 130-145 lb Goal rate of weight gain:  0.5 lb/week   Weight history:  Highest weight: ~128 lb  Lowest (adult) weight: ~112 lb How has weight changed in the past year: maintained essentially; had lost 16 pounds year before (POTS diagnosis)  Medical Information:  Changes in hair, skin, nails since ED started: thinks hair is thinning Chewing/swallowing difficulties: denies  Relux or heartburn: denies Trouble with teeth: denies- needs dental visit LMP without the use of hormones: July 2015  Weight at that point: unsure, growth charts from that time indicate ~118 lb??  *Was prescribed OCP to "regulate her cycles"  Cycles are still irregular.  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   Dietary assessment: mom says she's always been picky; currently following gluten-free diet  Safe foods include: salad with minimal dressing, fruits, oatmeal, rice chex Avoided foods include:desserts, meat, pasta, fast food, chips, pizza, bread, all other potatoes besides baked   24 hour recall B: kind bar S: trail mix: rocky road S: greek yogurt L: black bean burger with tomoato, guc, cheese, tortilla chips B: banana  Today so far B: kind bar S: smoothie  Sunday B: kind bar L: strawberry poppyseed salad (1/2 size) S: apple with peanut butter D: rice chex with banana and soy milk S: lemon buddy   What Methods Do You Use To Control Your Weight (Compensatory behaviors)?           Restricting (calories, fat, carbs): doesn't stick to specific number, but eats ~ 800-900 kcal/day  SIV: previously 2-6 times/week.  None currently, Can't remember last time.    Estimated energy intake: [phone] kcal  Estimated energy needs: 2200 kcal for weight restoration    275 g CHO 110 g pro 73 g fat  Nutrition Diagnosis: NI-1.4 Inadequate energy intake As related to restricting and purging.  As evidenced by weight loss of 16 pounds .  Intervention/Goals:  Nutrition counseling provided.  Reiterated "food is medicine." if she misses/forgets a dose  of any of her prescribed pills, she will take it later, mom will bring it to her school, etc.  She needs to take her "food medicine" as seriously.  If she forgets a dose, she needs to take it later, or double up.  She can't take "half doses" or skip doses.  When she doesn't get her full dose of foods medicine, there are real consequences,  physical and mental.  Explained again how her internal hunger/fullness cues may be off and can't be relied upon until she is weight restored.  She needs to eat, even if she's not necessarily hungry.    Meal plan is 5 eating occasions with protein and starch with each option.  She is to eat her vegetables AFTER she eats her starch and protein options to prevent her from filling up on vegetables.    Corrected cognitive distortions about weight gain associated with breads and pastas.  [name] became emotional when talking about pasta.  Family would love to start anti-anxiety medications as soon as possible.  This provider conferred with Dr. Marina Goodell who is agreeable to speak with [name]'s mom directly and is also willing to work [name] in sooner than her current scheduled appointment in October.       Meal plan (to provide ~[phone] kcal) B: 1/2 oatmeal S: other half of oatmeal or Boost/Ensure/CIB or greek yogurt or full smoothie L: 1 cup fruit salad or 1 cup chicken salad S: whole granola bar or whole trail mix sleeve or 1 cup trail mix; fruit with nuts or cheese D: 4 piece nuggets with fruit; full salad from [employer]; veggie patty with steamed broccoli with 7-8 tortilla chips or corn or tortilla S: chex with regular milk or soy or baked oatmeal      Monitoring and Evaluation: Patient will follow up in 1 weeks.

## 2015-03-17 ENCOUNTER — Encounter: Payer: Self-pay | Admitting: *Deleted

## 2015-03-18 ENCOUNTER — Telehealth: Payer: Self-pay | Admitting: Pediatrics

## 2015-03-18 NOTE — Telephone Encounter (Signed)
Spoke with patient's PCP who is concerned about patient's depressive symptoms.  Reviewed medication history and advised to start on Prozac 20 mg and will work to add to our clinic schedule in 3 weeks.

## 2015-03-18 NOTE — Telephone Encounter (Signed)
LVM for mother asking her to call back to schedule. Left direct contact number for mom to call back

## 2015-03-19 ENCOUNTER — Other Ambulatory Visit: Payer: Self-pay | Admitting: Family

## 2015-03-23 ENCOUNTER — Encounter: Payer: Self-pay | Admitting: *Deleted

## 2015-03-23 ENCOUNTER — Encounter: Payer: BLUE CROSS/BLUE SHIELD | Admitting: *Deleted

## 2015-03-23 DIAGNOSIS — F509 Eating disorder, unspecified: Secondary | ICD-10-CM

## 2015-03-23 NOTE — Patient Instructions (Signed)
Get in something before school: food or a shake Aim to eat macronutrients before micronutrients: carbs/pro before veggies experiment with different proteins: egg salad, fish, nuts, shakes

## 2015-03-23 NOTE — Progress Notes (Signed)
Appointment start time: 1400  Appointment end time: 1445  Patient was seen on 03/23/15 for nutrition counseling pertaining to disordered eating.  She is accompanied by her mother  Primary care provider: Dr. Rana Snare Therapist: looking for therapist.   Any other medical team members: September 20 appointment with Dr. Marina Goodell Parents: [name]  Assessment:    Varina has been prescribed 20 mg Fluoxetine.  She stared that 03/19/15.  She has not yet started the increase hormone dose OCP, but will soon.  She doesn't think she's made any changes in her eating this week.  She family continues to be supportive and try to remind her to eat and increase her intake.  She continues to try and follow her hunger cues, which are misleading due to her malnutrition.     Growth Metrics: Ideal BMI for age: 5.6 BMI today: 20.0 % Ideal today:  92% Previous growth data: weight/age  63-90th%; height/age at 75%; BMI/age 68-85% Goal BMI range based on growth chart data: 22-25 Goal weight range based on growth chart data: 130-145 lb Goal rate of weight gain:  0.5 lb/week   Weight history:  Highest weight: ~128 lb  Lowest (adult) weight: ~112 lb How has weight changed in the past year: maintained essentially; had lost 16 pounds year before (POTS diagnosis)  Medical Information:  Changes in hair, skin, nails since ED started: thinks hair is thinning Chewing/swallowing difficulties: denies  Relux or heartburn: denies Trouble with teeth: denies- needs dental visit LMP without the use of hormones: July 2015  Weight at that point: unsure, growth charts from that time indicate ~118 lb??  *Was prescribed OCP to "regulate her cycles"  Cycles are still irregular.  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   Dietary assessment: mom says she's always been picky; currently following gluten-free diet  Safe foods include: salad with minimal dressing, fruits, oatmeal, rice chex Avoided foods include:desserts, meat, pasta, fast food, chips, pizza, bread, all other potatoes besides baked   24 hour recall 9 am: Granola bar (Kind) L: sweet potato casserole S: smoothie (yogurt, banana, apple juice, frozen fruit) D: chicken salad (1/2 cup) S: yogurt Beverages: water, juice for Miralax  Sunday B: Kind bar L: broccoli salad, potato salad, fresh [place], deviled egg S: banana with peanut butter, maybe 1 tbsp D: taco salad with a few tortilla chips crumbled, 1/2 avocado, cheese S: greek yogurt  Saturday B: Quest bar L: chicken salad (1/2 cup) S: banana D: corn on cob  Friday banana  Salad with avocado, boiled egg, cheese, pecans, olives, honey mustard dressing Trail mix, part of praline D: sweet potato casserole, broccoli with cheese sauce, boiled egg S: greek yogurt   What Methods Do You Use To Control Your Weight (Compensatory behaviors)?           Restricting (calories, fat, carbs): doesn't stick to specific number, but eats ~[phone] kcal/day  SIV: previously 2-6 times/week.  None currently, Can't remember last time.    Estimated energy intake: [phone] kcal  Estimated energy needs: 2200 kcal for weight restoration    275 g CHO 110 g pro 73 g fat  Nutrition Diagnosis: NI-1.4 Inadequate energy intake As  related to restricting and purging.  As evidenced by weight loss of 16 pounds .  Intervention/Goals:  Nutrition counseling provided.  Reiterated "food is medicine." if she misses/forgets a dose of any of her prescribed pills, she will take it later, mom will bring it to her school, etc.  She needs to take her "food  medicine" as seriously.  If she forgets a dose, she needs to take it later, or double up.  She can't take "half doses" or skip doses.  When she doesn't get her full dose of foods medicine, there are real consequences, physical and mental.  Explained again how her internal hunger/fullness cues may be off and can't be relied upon until she is weight restored.  She needs to eat, even if she's not necessarily hungry.    Meal plan is 5 eating occasions with protein and starch with each option.  She is to eat her vegetables AFTER she eats her starch and protein options to prevent her from filling up on vegetables.    Reiterated need for protein for muscles (especially heart) and carbs for glucose for brain fuel  Goals: Get in something before school: food or a shake Aim to eat macronutrients before micronutrients: carbs/pro before veggies experiment with different proteins: egg salad, fish, nuts, shakes   Meal plan (to provide ~1000-1200 kcal) B: 1/2 oatmeal S: other half of oatmeal or Boost/Ensure/CIB or greek yogurt or full smoothie L: 1 cup fruit salad or 1 cup chicken salad S: whole granola bar or whole trail mix sleeve or 1 cup trail mix; fruit with nuts or cheese D: 4 piece nuggets with fruit; full salad from [employer]; veggie patty with steamed broccoli with 7-8 tortilla chips or corn or tortilla S: chex with regular milk or soy or baked oatmeal    Monitoring and Evaluation: Patient will follow up in 2 weeks.

## 2015-03-25 ENCOUNTER — Ambulatory Visit: Payer: BLUE CROSS/BLUE SHIELD | Admitting: Pediatrics

## 2015-03-31 ENCOUNTER — Encounter: Payer: Self-pay | Admitting: Licensed Clinical Social Worker

## 2015-04-06 ENCOUNTER — Telehealth: Payer: Self-pay

## 2015-04-06 DIAGNOSIS — G43009 Migraine without aura, not intractable, without status migrainosus: Secondary | ICD-10-CM

## 2015-04-06 MED ORDER — QUDEXY XR 100 MG PO CS24: EXTENDED_RELEASE_CAPSULE | ORAL | Status: DC

## 2015-04-06 NOTE — Telephone Encounter (Signed)
Joy, mom, lvm stating that Dr. Rexene Edison gave child a prescription for a sample of Quedexy XR 100 mg. Mother is requesting a Rx for the medication to be sent to the pharmacy. Mother can be reached at: [phone].

## 2015-04-06 NOTE — Telephone Encounter (Signed)
Please let Mom know that Qudexy prescription was faxed to [facility]. Thanks, Inetta Fermo

## 2015-04-06 NOTE — Telephone Encounter (Signed)
LVMFM letting her know that Rx was faxed to pharmacy.

## 2015-04-08 ENCOUNTER — Encounter: Payer: BLUE CROSS/BLUE SHIELD | Attending: Pediatrics | Admitting: *Deleted

## 2015-04-08 DIAGNOSIS — F509 Eating disorder, unspecified: Secondary | ICD-10-CM | POA: Insufficient documentation

## 2015-04-08 DIAGNOSIS — Z713 Dietary counseling and surveillance: Secondary | ICD-10-CM | POA: Diagnosis not present

## 2015-04-08 NOTE — Progress Notes (Signed)
Appointment start time: 1400  Appointment end time: 1445  Patient was seen on 04/08/15 for nutrition counseling pertaining to disordered eating.  She is accompanied by her mother  Primary care provider: Dr. Rana Snare Therapist: Noni Saupe, weekly    Any other medical team members: September 20 appointment with Dr. Marina Goodell Parents: [name]  Assessment:    School is slightly stressful.  Has been advised by therapist to drop her philisophy class, but does not want to do that as that will make her schedule busy next semester. She eats now breakfast before class.  Yesterday also ate without being told.  Most of the time she eats 4 "meals", not the 5 as recommended.  She's also not getting enough protein.  She continues to follow gluten-free diet without having medical testing to prove this is necessary. Has not yet started increased dose OCPs  Starts 04/11/15.  Has appointment with Dr. Marina Goodell 04/13/15.  She looks at calories and tries not to eat high calorie foods, but she doesn't really know how much is too much or how much she needs, she arbitrarily doesn't eat things she thinks are too much  BM: every 3-4 days, strains with Miralax No additional physical activity, is trying to drink a lot of water to help her constipation.  Not helping   Growth Metrics: Ideal BMI for age: 59.6 BMI 03/16/15: 20.0 % Ideal:  92% Previous growth data: weight/age  33-90th%; height/age at 75%; BMI/age 58-85% Goal BMI range based on growth chart data: 22-25 Goal weight range based on growth chart data: 130-145 lb Goal rate of weight gain:  0.5 lb/week   Weight history:  Highest weight: ~128 lb  Lowest (adult) weight: ~112 lb How has weight changed in the past year: maintained essentially; had lost 16 pounds year before (POTS diagnosis)  Medical Information:  Changes in hair, skin, nails since ED started: thinks hair is thinning Chewing/swallowing difficulties: denies  Relux or heartburn: denies Trouble with  teeth: denies- needs dental visit LMP without the use of hormones: July 2015  Weight at that point: unsure, growth charts from that time indicate ~118 lb??  *Was prescribed OCP to "regulate her cycles"  Cycles are still irregular.  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   Dietary assessment: mom says she's always been picky; currently following gluten-free diet  Safe foods include: salad with minimal dressing, fruits, oatmeal, rice chex Avoided foods include:desserts, meat, pasta, fast food, chips, pizza, bread, all other potatoes besides baked   24 hour recall B: special k protein shake L: quinoa with spinach and tomatoes (1/2 cup) S: Luna bar S: salad with cheese, honey mustard dressing, fruit cocktail S: oatmeal cookie with pecans and apples Beverages: water  Tuesday B: special K protein S: greek yogurt  S: 8 grapes 1-2 oz greek chicken with spinach and 2/3 cup quinoa S: coffee with cream  Monday B: kind bar S: apple peanut butter 6 grapes, candy caramel L: chicken fil a kids meal and fruit cup Banana with peanut butter  Sunday B: 1/2 granola bar, 6 bites fruit S: greek yogurt L: ice pumpkin coffee, string cheese and 4 pecans D: black bean burger with  avocado and provolone S: pumpkin fluff (1 cup)  Saturday B: pumpkin cheerios with soy milk Egg salad and gluten free bread Spoonful icing Full size fuji apple pecan salad   What Methods Do You Use To Control Your Weight (Compensatory behaviors)?           Restricting (calories, fat, carbs): doesn't stick to specific number, but eats ~[phone]  kcal/day  SIV: previously 2-6 times/week.  None currently, Can't remember last time.    Estimated energy intake: 800-900 kcal  Estimated energy needs: 2200 kcal for weight restoration    275 g CHO 110 g pro 73 g fat  Nutrition Diagnosis: NI-1.4 Inadequate energy intake As related to restricting and purging.  As evidenced by weight loss of 16 pounds .  Intervention/Goals:  Nutrition counseling provided.  Educated client that inadequate calorie intake contributes to constipation. Eating more will help.  Eating will also help her mood/energy level/and body temperature regulation.  Reiterated "food is medicine." if she misses/forgets a dose of any of her prescribed pills, she will take it later, mom will bring it to her school, etc.  She needs to take her "food medicine" as seriously.  If she forgets a dose, she needs to take it later, or double up.  She can't take "half doses" or skip doses.  Eating will make her feel better.  Discussed calorie counting and how it's not helpful.  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  Encouraged her to turn that responsibility over to this provider; let her dietitian worry about her calories so she can focus on other things.  She nodded in agreement.  Suggested not looking at labels or if she does, to redirect those thoughts" food is fuel; my body needs this."  Goals: 5 eating occasions each day: starch and protein with each "meal" Eat carbs/pro before vegetables Try not to look at calories and if you do see them, remind yourself food is fuel that your body needs   Meal plan (to provide ~1000-1200 kcal) B: 1/2 oatmeal S: other half of oatmeal or Boost/Ensure/CIB or greek yogurt or full smoothie L: 1 cup fruit salad or 1 cup chicken salad S: whole granola bar or whole trail mix sleeve or 1 cup trail mix; fruit with nuts or cheese D: 4 piece nuggets  with fruit; full salad from [employer]; veggie patty with steamed broccoli with 7-8 tortilla chips or corn or tortilla S: chex with regular milk or soy or baked oatmeal    Monitoring and Evaluation: Patient will follow up in 1 week, combined visit with Dr. Marina Goodell.

## 2015-04-13 ENCOUNTER — Encounter: Payer: Self-pay | Admitting: *Deleted

## 2015-04-13 ENCOUNTER — Ambulatory Visit (INDEPENDENT_AMBULATORY_CARE_PROVIDER_SITE_OTHER): Payer: BLUE CROSS/BLUE SHIELD | Admitting: Pediatrics

## 2015-04-13 ENCOUNTER — Encounter: Payer: Self-pay | Admitting: Pediatrics

## 2015-04-13 ENCOUNTER — Encounter: Payer: BLUE CROSS/BLUE SHIELD | Admitting: *Deleted

## 2015-04-13 VITALS — BP 99/73 | HR 76 | Ht 64.75 in | Wt 117.3 lb

## 2015-04-13 DIAGNOSIS — I951 Orthostatic hypotension: Secondary | ICD-10-CM | POA: Diagnosis not present

## 2015-04-13 DIAGNOSIS — F5000 Anorexia nervosa, unspecified: Secondary | ICD-10-CM | POA: Diagnosis not present

## 2015-04-13 DIAGNOSIS — E441 Mild protein-calorie malnutrition: Secondary | ICD-10-CM | POA: Diagnosis not present

## 2015-04-13 DIAGNOSIS — Z1389 Encounter for screening for other disorder: Secondary | ICD-10-CM | POA: Diagnosis not present

## 2015-04-13 DIAGNOSIS — Z113 Encounter for screening for infections with a predominantly sexual mode of transmission: Secondary | ICD-10-CM

## 2015-04-13 DIAGNOSIS — F4323 Adjustment disorder with mixed anxiety and depressed mood: Secondary | ICD-10-CM | POA: Diagnosis not present

## 2015-04-13 DIAGNOSIS — F509 Eating disorder, unspecified: Secondary | ICD-10-CM | POA: Diagnosis not present

## 2015-04-13 LAB — POCT URINALYSIS DIPSTICK
Bilirubin, UA: NORMAL
Glucose, UA: NORMAL
KETONES UA: NORMAL
Nitrite, UA: NEGATIVE
RBC UA: NORMAL
SPEC GRAV UA: 1.015
Urobilinogen, UA: NEGATIVE
pH, UA: 7

## 2015-04-13 MED ORDER — FLUOXETINE HCL 20 MG PO TABS: 30.0000 mg | ORAL_TABLET | Freq: Every day | ORAL | Status: DC

## 2015-04-13 NOTE — Progress Notes (Signed)
Appointment start time: 1400  Appointment end time: 1445  Patient was seen on 04/13/15 for nutrition counseling pertaining to disordered eating.  She is accompanied by her mother  Primary care provider: Dr. Rana Snare Therapist: Noni Saupe, weekly    Any other medical team members: Dr. Marina Goodell Parents: [name]  Assessment:   [name] saw her PCP last week.  At that visit she learned she has lost weight.  She agreed to increase her intake, but admits today that she has not done that.     Growth Metrics: Ideal BMI for age: 44.6 BMI 04/13/15: 19.6 % Ideal:  90% Previous growth data: weight/age  27-90th%; height/age at 75%; BMI/age 77-85% Goal BMI range based on growth chart data: 22-25 Goal weight range based on growth chart data: 130-145 lb Goal rate of weight gain:  0.5 lb/week   Mental health diagnosis: AN, B/P subtype   Dietary assessment: mom says she's always been picky; currently following gluten-free diet  Safe foods include: salad with minimal dressing, fruits, oatmeal, rice chex Avoided foods include:desserts, meat, pasta, fast food, chips, pizza, bread, all other potatoes besides baked   24 hour recall B: kind bar S: greek yogurt smoothie, banana, frozen berries L: apple with peanut butter Small slaad with grilled chickken, cheese, olives S: greek yogurt  Sunday B: special k smoothie L quinoa with some chicken and spinach Banana  With peanut butter Coffee 1/2 cup oatmeal with blueberries and pecans    What Methods Do You Use To Control Your Weight (Compensatory behaviors)?           Restricting (calories, fat, carbs): doesn't stick to specific number, but eats ~[phone] kcal/day  SIV: previously 2-6 times/week.  None currently, Can't remember last time.    Estimated energy intake: [phone] kcal  Estimated energy needs: 2200 kcal for weight restoration    275 g CHO 110 g pro 73 g fat  Nutrition Diagnosis: NI-1.4 Inadequate energy intake As related to  restricting and purging.  As evidenced by weight loss of 16 pounds .  Intervention/Goals:  Nutrition counseling provided. Discussed "food is medicine".  Discussed physical consequences of malnutrition.  Mentioned briefly that a higher level of care could be needed, if she is not able to make progress at current treatment level.  Sympathized at how difficult it is to recover from an ED, but emphasized need for adequate nutrition for physical and mental health.  She did not argue.  Suggested using dietary exchanges as a way to ensure adequate intake.  She agreed.  Provided information on exchange system, including her favorite foods like Quest bars, CIB, yogurt, etc.    Meal plan to provide 1200 kcal:  Dairy: 2 Fruit: 2 Vegetable: 3 Starch: 5 Protein: 3 Fat: 5      Monitoring and Evaluation: Patient will follow up in 1 week

## 2015-04-13 NOTE — Progress Notes (Signed)
THIS RECORD MAY CONTAIN CONFIDENTIAL INFORMATION THAT SHOULD NOT BE RELEASED WITHOUT REVIEW OF THE SERVICE PROVIDER.  Adolescent Medicine Consultation Initial Visit Jessica [name]  is a 19 y.o. female referred by Lennie Hummer, MD here today for evaluation of anorexia nervosa.      Growth Chart Viewed? Did not receive prior to her visit   History was provided by the patient and mother.  PCP Confirmed?  yes  My Chart Activated?   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 wanted to do something in the medical field,  Classwork is going "okay," getting the work done Has 2 siblings at home (1 sister - 59 yrs and 1 brother - 62 yrs at home) and 1 brother at college (61 yrs - [place])  Noting a difference with the prozac, noted about 2 weeks after starting it More active and alert, now going out for coffee No trouble falling asleep or staying asleep, does take nap, sometimes sleeps through eating  Therapy: Alvis Lemmings, seen her twice Nutrition: Ozzie Hoyle  From Dietitian note: Growth Metrics: Ideal BMI for age: 61.6 Previous growth data: weight/age 48-90th%; height/age at 75%; BMI/age 12-85% Goal BMI range based on growth chart data: 22-25 Goal weight range based on growth chart data: 130-145 lb Goal rate of weight gain: 0.5 lb/week  Weight history:  Highest weight: ~128 lbLowest (adult) weight: ~112 lb  Labs done by Dr. Corinna Capra:  Normal CMP except low alk phos,  TSH/FT4 wnl, Vitamin D 34  Patient's last menstrual period was 03/30/2015.  ROS:   Notes hair thinning No skin changes, no easy brusing. No vision changes HAs had gotten worse, changed her medication No dysphagia or odynophagia Stomach:  Constipation Menses:  None for 7 months, now getting her period sometimes of OCP NO dysuria No joint pain or swelling Was having some aching in her bones  Allergies  Allergen Reactions  . Amoxicillin Rash     Medication List       This list is accurate as of: 04/13/15 11:59 PM.  Always use your most recent med list.               atenolol 25 MG tablet  Commonly known as:  TENORMIN  1 tablet in the morning     fludrocortisone 0.1 MG tablet  Commonly known as:  FLORINEF  Take 1 tablet by mouth daily     FLUoxetine 20 MG tablet  Commonly known as:  PROZAC  Take 1.5 tablets (30 mg total) by mouth daily.     ibuprofen 200 MG tablet  Commonly known as:  ADVIL,MOTRIN  Take 400 mg by mouth every 8 (eight) hours as needed. For headache     JUNEL FE 1.5/30 1.5-30 MG-MCG tablet  Generic drug:  norethindrone-ethinyl estradiol-iron  Take 1 tablet by mouth daily.     MULTIVITAMIN ADULT PO  Take by mouth.     phenylephrine 10 MG Tabs tablet  Commonly known as:  SUDAFED PE  Take 10 mg by mouth 2 (two) times daily as needed. For sinus pressure.     polyethylene glycol packet  Commonly known as:  MIRALAX / GLYCOLAX  Take 17 g by mouth daily.     QUDEXY XR 100 MG Cs24  Generic drug:  Topiramate ER  One tablet at nighttime     SUMAtriptan 25 MG tablet  Commonly known as:  IMITREX  TAKE ONE AT ONSET OF MIGRAINE WITH 400 MG OF IBUPROFEN     VIACTIV PO  Take by mouth.         Past Medical History:  Reviewed and updated?  yes Past Medical History  Diagnosis Date  . POTS (postural orthostatic tachycardia syndrome)   . Migraine headache   . Syncope 09/22/2014    Family History: Reviewed and updated? yes Family History  Problem  Relation Age of Onset  . Migraines Mother     Started 4th or 5th grade  . Seizures Mother     Febrile Seizures as a child  . Seizures Father     Febrile Seizures as a child  . Migraines Maternal Aunt   . Migraines Maternal Uncle   . Mental retardation Other     Maternal Second Cousin  . Other Other     Maternal Great Uncle had some sort of Retinal Vessel Occlusion  . Stroke Maternal Grandfather     mini-stroke  . Cancer Maternal Grandfather     Bladder cancer, Died at 39  . Diabetes Maternal Grandmother   . Diabetes Maternal Grandfather   . Hypertension Father   . Kidney Stones Father   . Breast cancer Maternal Aunt   . Cancer Maternal Aunt     Peritoneal CA  . Eating disorder Maternal Aunt   . Depression      Father's side of the family   Social History: Confidentiality was discussed with the patient and if applicable, with caregiver as well.  Tobacco?  no Drugs/ETOH?  no Partner preference?  female Sexually Active?  no   Pregnancy Prevention:  birth control pills, reviewed condoms & plan B Safe at home, in school & in relationships?  Yes Safe to self?  Yes   The following portions of the patient's history were reviewed and updated as appropriate: allergies, current medications, past family history, past medical history, past social history, past surgical history and problem list.  Physical Exam:  Filed Vitals:   04/01/15 1718 04/13/15 1336 04/13/15 1355 04/13/15 1358  BP:  _0  Pulse:  73 66 76  Height:  5' 4.75" (1.645 m)    Weight: 117 lb (53.07 kg) 117 lb 4.6 oz (53.2 kg)     BP 99/73 mmHg  Pulse 76  Ht 5' 4.75" (1.645 m)  Wt 117 lb 4.6 oz (53.2 kg)  BMI 19.66 kg/m2  LMP 03/30/2015 Body mass index: body mass index is 19.66 kg/(m^2). Blood pressure percentiles are 66% systolic and 44% diastolic based on 0347 NHANES data. Blood pressure percentile targets: 90: 122/77, 95: 126/81, 99 + 5 mmHg: 138/94.  Physical Exam  Constitutional: No distress.   HENT:  Mouth/Throat: No oropharyngeal exudate.  Eyes: EOM are normal. Pupils are equal, round, and reactive to light.  Neck: No thyromegaly present.  Cardiovascular: Normal rate and regular rhythm.   No murmur heard. Pulmonary/Chest: Breath sounds normal.  Abdominal: Soft. There is no tenderness. There is no guarding.  Musculoskeletal: She exhibits no edema.  Lymphadenopathy:  She has no cervical adenopathy.  Neurological: She is alert. She has normal reflexes.  Skin: Skin is warm.   PHQ-SADS 04/13/2015  PHQ-15 14  GAD-7 9  PHQ-9 8  Comment Somewhat difficult   EAT-26 04/13/2015  Total Score 37  Patient Report of Weight-Highest 130 lb  Patient Report of Weight-Lowest 112 lb  Patient Report of Weight-Ideal 117 lb  Binge No  Purge (Vomit) Yes  How Many Times? 2-6 per  How Often? Week(s)  Over-Exercise Yes </=1 per month    Assessment/Plan: 19 yo female with anorexia nervosa presents for initial evaluation.  She does not currently require medical admission.  She has experienced some improvement with prozac after a recent increase from 20 to 30 mg dose.  However, her weight has been decreasing and although she has insight regarding the eating disorder she is unable to commit to gaining weight.  Discussed the need to get back to her pre-POTs weight.  Would consider d/c of OCPs so that we can monitor for return of menses.  Pt would like to continue OCP for now.  Discussed would consider further increase in prozac dose or other medication depending on her progress next visit.  Discussed that further weight loss or difficulty committing to gaining weight may necessitate higher level of care. 1. Malnutrition of mild degree Altamease Oiler: 75% to less than 90% of standard weight) ([place]) Continue weekly nutrition visits Follow-up with me in 2 weeks, but consider increased frequency of visits if weight is not increasing Consider IOP if no improvement in next few months or sooner if worsening  2.  Adjustment disorder with mixed anxiety and depressed mood 3. Anorexia nervosa - FLUoxetine (PROZAC) 20 MG tablet; Take 1.5 tablets (30 mg total) by mouth daily.  Dispense: 45 tablet; Refill: 1 - Cont weekly therapy - Consider increasing prozac in near future or adding zyprexa  4. Screening for genitourinary condition - POCT urinalysis dipstick  5. Orthostatic hypotension Per cardiology - fludrocortisone (FLORINEF) 0.1 MG tablet; Take 1 tablet by mouth daily  Dispense: 90 tablet; Refill: 5  6. Routine screening for STI (sexually transmitted infection) - GC/chlamydia probe amp, urine   Follow-up:   Return in about 2 weeks (around 04/27/2015) for DE f/u with extended vitals, with Dr. Henrene Pastor.   Medical decision-making:  > 60 minutes spent, more than 50% of appointment was spent discussing diagnosis and management of symptoms

## 2015-04-13 NOTE — Progress Notes (Signed)
Note opened in error.

## 2015-04-14 LAB — GC/CHLAMYDIA PROBE AMP, URINE
CHLAMYDIA, SWAB/URINE, PCR: NEGATIVE
GC PROBE AMP, URINE: NEGATIVE

## 2015-04-22 ENCOUNTER — Encounter: Payer: BLUE CROSS/BLUE SHIELD | Admitting: *Deleted

## 2015-04-22 ENCOUNTER — Encounter: Payer: Self-pay | Admitting: *Deleted

## 2015-04-22 DIAGNOSIS — E441 Mild protein-calorie malnutrition: Secondary | ICD-10-CM

## 2015-04-22 DIAGNOSIS — F509 Eating disorder, unspecified: Secondary | ICD-10-CM | POA: Diagnosis not present

## 2015-04-22 DIAGNOSIS — F5000 Anorexia nervosa, unspecified: Secondary | ICD-10-CM

## 2015-04-22 NOTE — Progress Notes (Signed)
Appointment start time: 1400  Appointment end time: 1500  Patient was seen on 04/22/15 for nutrition counseling pertaining to disordered eating.  She is accompanied by her mother  Primary care Jessica [name]: Dr. Rana Snare Therapist: Noni [name], weekly    Any other medical team members: Dr. Marina Goodell Parents: Jessica [name]  Assessment:   Jessica [name] says things are going fine.  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  She has not been able to make any lasting dietary changes.  She tried the exchanges we agreed upon last week.  She tried the exchange system for a few days, but was not able to sustain that.  She felt it was too complicated.   Growth Metrics: Ideal BMI for age: 63.6 BMI 04/13/15: 19.6 % Ideal:  90% Previous growth data: weight/age  59-90th%; height/age at 75%; BMI/age 57-85% Goal BMI range based on growth chart data: 22-25 Goal weight range based on growth chart data: 130-145 lb Goal rate of weight gain:  0.5 lb/week   Mental health diagnosis: AN, B/P subtype   Dietary assessment: mom says she's always been picky; currently following gluten-free diet  Safe foods include: salad with minimal dressing, fruits, oatmeal, rice chex Avoided foods include:desserts, meat, pasta, fast food, chips, pizza, bread, all other potatoes besides baked   What Methods Do You Use To Control Your Weight (Compensatory behaviors)?           Restricting (calories, fat, carbs): doesn't stick to specific number, but eats ~[phone] kcal/day  SIV: previously 2-6 times/week.  None currently, Can't remember last time.    Estimated energy intake: [phone] kcal  Estimated energy needs: 2200 kcal for weight restoration    275 g CHO 110 g pro 73 g fat  Nutrition Diagnosis: NI-1.4 Inadequate energy intake As related to restricting and purging.  As evidenced by weight loss of 16 pounds .  Intervention/Goals:  Offered prescribed meal plan with actual foods, not just exchanges.  Jessica [name] states that is too much food.  Explained she  needs to increase her food intake as she lost weight.  She became emotional and cried for most of the visit.  She is afraid eating more will upset her stomach.  Offered supplements like Boost/Ensure to increase her nutrition, but not affect satiety.  She is also afraid of gaining weight.  Explained 1 lb of fat is 3500 calories and she's eating no where near that and her meal plan is no where near that.  Explained the goal right now is just to stop the weight loss, but necessarily to facilitate weight gain.  She chose not to respond to further questioning.  This Jessica [name] explained how serious anorexia is and how nutrition rehabilitation will improve cognitive functioning so that she can fight back against disordered eating thoughts.   Asked how she would treat her sister or a future patient who had AN.  She stated she would "tell them they need to eat."  Asked why she is different and her needs are different? Does she not need to eat also?  She agreed to add 1 starbucks bottled frappacino or glass of juice to her current eating pattern.   Monitoring and Evaluation: Patient will follow up in 1 week

## 2015-04-29 ENCOUNTER — Ambulatory Visit (INDEPENDENT_AMBULATORY_CARE_PROVIDER_SITE_OTHER): Payer: BLUE CROSS/BLUE SHIELD | Admitting: Pediatrics

## 2015-04-29 ENCOUNTER — Encounter: Payer: Self-pay | Admitting: *Deleted

## 2015-04-29 ENCOUNTER — Encounter: Payer: Self-pay | Admitting: Pediatrics

## 2015-04-29 ENCOUNTER — Encounter: Payer: BLUE CROSS/BLUE SHIELD | Attending: Pediatrics | Admitting: *Deleted

## 2015-04-29 VITALS — BP 113/74 | HR 90 | Ht 64.76 in | Wt 116.0 lb

## 2015-04-29 DIAGNOSIS — E441 Mild protein-calorie malnutrition: Secondary | ICD-10-CM

## 2015-04-29 DIAGNOSIS — F5000 Anorexia nervosa, unspecified: Secondary | ICD-10-CM

## 2015-04-29 DIAGNOSIS — Z5181 Encounter for therapeutic drug level monitoring: Secondary | ICD-10-CM | POA: Diagnosis not present

## 2015-04-29 DIAGNOSIS — I951 Orthostatic hypotension: Secondary | ICD-10-CM | POA: Diagnosis not present

## 2015-04-29 DIAGNOSIS — F509 Eating disorder, unspecified: Secondary | ICD-10-CM | POA: Diagnosis present

## 2015-04-29 DIAGNOSIS — Z713 Dietary counseling and surveillance: Secondary | ICD-10-CM | POA: Insufficient documentation

## 2015-04-29 DIAGNOSIS — Z1389 Encounter for screening for other disorder: Secondary | ICD-10-CM

## 2015-04-29 LAB — CBC WITH DIFFERENTIAL/PLATELET
BASOS ABS: 0.1 10*3/uL (ref 0.0–0.1)
BASOS PCT: 1 % (ref 0–1)
Eosinophils Absolute: 0.1 10*3/uL (ref 0.0–0.7)
Eosinophils Relative: 1 % (ref 0–5)
HCT: 41.2 % (ref 36.0–46.0)
HEMOGLOBIN: 13.9 g/dL (ref 12.0–15.0)
LYMPHS PCT: 32 % (ref 12–46)
Lymphs Abs: 2.4 10*3/uL (ref 0.7–4.0)
MCH: 30.8 pg (ref 26.0–34.0)
MCHC: 33.7 g/dL (ref 30.0–36.0)
MCV: 91.4 fL (ref 78.0–100.0)
MPV: 10.8 fL (ref 8.6–12.4)
Monocytes Absolute: 0.5 10*3/uL (ref 0.1–1.0)
Monocytes Relative: 7 % (ref 3–12)
NEUTROS ABS: 4.4 10*3/uL (ref 1.7–7.7)
NEUTROS PCT: 59 % (ref 43–77)
Platelets: 350 10*3/uL (ref 150–400)
RBC: 4.51 MIL/uL (ref 3.87–5.11)
RDW: 13.5 % (ref 11.5–15.5)
WBC: 7.4 10*3/uL (ref 4.0–10.5)

## 2015-04-29 LAB — COMPREHENSIVE METABOLIC PANEL
ALK PHOS: 31 U/L — AB (ref 47–176)
ALT: 19 U/L (ref 5–32)
AST: 16 U/L (ref 12–32)
Albumin: 4.8 g/dL (ref 3.6–5.1)
BUN: 15 mg/dL (ref 7–20)
CALCIUM: 9.9 mg/dL (ref 8.9–10.4)
CHLORIDE: 106 mmol/L (ref 98–110)
CO2: 24 mmol/L (ref 20–31)
Creat: 0.74 mg/dL (ref 0.50–1.00)
Glucose, Bld: 69 mg/dL (ref 65–99)
POTASSIUM: 4.2 mmol/L (ref 3.8–5.1)
Sodium: 140 mmol/L (ref 135–146)
TOTAL PROTEIN: 7.6 g/dL (ref 6.3–8.2)
Total Bilirubin: 0.6 mg/dL (ref 0.2–1.1)

## 2015-04-29 LAB — HEMOGLOBIN A1C
HEMOGLOBIN A1C: 5.4 % (ref <5.7)
Mean Plasma Glucose: 108 mg/dL (ref <117)

## 2015-04-29 LAB — LIPID PANEL
CHOLESTEROL: 187 mg/dL — AB (ref 125–170)
HDL: 38 mg/dL (ref 36–76)
LDL Cholesterol: 126 mg/dL — ABNORMAL HIGH (ref <110)
TRIGLYCERIDES: 116 mg/dL (ref 40–136)
Total CHOL/HDL Ratio: 4.9 Ratio (ref <=5.0)
VLDL: 23 mg/dL (ref <30)

## 2015-04-29 LAB — AMYLASE: AMYLASE: 45 U/L (ref 0–105)

## 2015-04-29 MED ORDER — OLANZAPINE 2.5 MG PO TABS: 2.5000 mg | ORAL_TABLET | Freq: Every day | ORAL | Status: DC

## 2015-04-29 NOTE — Progress Notes (Signed)
THIS RECORD MAY CONTAIN CONFIDENTIAL INFORMATION THAT SHOULD NOT BE RELEASED WITHOUT REVIEW OF THE SERVICE PROVIDER.  Adolescent Medicine Consultation Follow-Up Visit TANJI STORRS  is a 19 y.o. female referred by [name], MD here today for follow-up of disordered eating.    Growth Chart Viewed? yes   History was provided by the patient and mother.  PCP Confirmed?  yes  My Chart Activated?   yes   Previsit planning completed:  yes Pre-Visit Planning  GEORGI TUEL  is a 19 y.o. female referred by Norman Clay, MD.   Last seen in [facility] on 04/13/2015 for anorexia nervosa, orthostatic hypotension, adjustment disorder.   Previous Psych Screenings?  yes, 04/13/2015  Treatment plan at last visit included continue current fluoxetine at current dose but consider increasing in near future.     Clinical Staff Visit Tasks:   - Urine GC/CT due? no - Psych Screenings Due? yes, PHQSADs - DE intake with extended vitals  Provider Visit Tasks: - Assess disordered eating behaviors - Assess current level of care  - Pertinent Labs? No  From Dietitian note: Growth Metrics: Ideal BMI for age: 46.6 Previous growth data: weight/age 47-90th%; height/age at 75%; BMI/age 21-85% Goal BMI range based on growth chart data: 22-25 Goal weight range based on growth chart data: 130-145 lb Goal rate of weight gain: 0.5 lb/week  Weight history:  Highest weight: ~128 lbLowest (adult) weight: ~112 lb  HPI:   Reviewed patient's most recent nutrition appt and the weight loss noted at today's visit Pt reports that she was overwhelmed by meal plan  Agreed to add something in to her current plan instead although admits that she has not really done that Energy level is low, anxious mostly around meal times Some thoughts about purging but has not acted on it in about 1 month, triggered by fullness or certain foods States she has not acted on it because her  family is always around [employer] that she does not want to gain weight Heard Dr. Rana Snare say she was fine staying at the weight she was although acknowledges that she has lost 5 more lbs since then Continues to voice understanding that she should gain weight but is still reluctant to do that Reports she is able to concentrate on her studies and does not feel her ED has affected her concentration  Patient's last menstrual period was 03/30/2015. Allergies  Allergen Reactions  . Amoxicillin Rash   Current Outpatient Prescriptions on File Prior to Visit  Medication Sig Dispense Refill  . atenolol (TENORMIN) 25 MG tablet 1 tablet in the morning    . Calcium-Vitamin D-Vitamin K (VIACTIV PO) Take by mouth.    . fludrocortisone (FLORINEF) 0.1 MG tablet Take 1 tablet by mouth daily 90 tablet 5  . FLUoxetine (PROZAC) 20 MG tablet Take 1.5 tablets (30 mg total) by mouth daily. 45 tablet 1  . ibuprofen (ADVIL,MOTRIN) 200 MG tablet Take 400 mg by mouth every 8 (eight) hours as needed. For headache     . Multiple Vitamins-Minerals (MULTIVITAMIN ADULT PO) Take by mouth.    . norethindrone-ethinyl estradiol-iron (JUNEL FE 1.5/30) 1.5-30 MG-MCG tablet Take 1 tablet by mouth daily. 1 Package 11  . phenylephrine (SUDAFED PE) 10 MG TABS Take 10 mg by mouth 2 (two) times daily as needed. For sinus pressure.     . polyethylene glycol (MIRALAX / GLYCOLAX) packet Take 17 g by mouth daily.    . QUDEXY XR 100 MG CS24 One tablet  at nighttime 30 each 5  . SUMAtriptan (IMITREX) 25 MG tablet TAKE ONE AT ONSET OF MIGRAINE WITH 400 MG OF IBUPROFEN 9 tablet 5   No current facility-administered medications on file prior to visit.    Social History: Confidentiality was discussed with the patient and if applicable, with caregiver as well.  Tobacco?  no Drugs/ETOH?  no Partner preference?  female Sexually Active?  no   Pregnancy Prevention:  birth control pills, reviewed condoms & plan B Safe at home, in school & in  relationships?  Yes Safe to self?  Yes   The following portions of the patient's history were reviewed and updated as appropriate: allergies, current medications, past social history and problem list.  Physical Exam:  Filed Vitals:   04/29/15 1313 04/29/15 1331 04/29/15 1335  BP:  102/62 113/74  Pulse:  73 90  Height: 5' 4.76" (1.645 m)    Weight: 116 lb (52.617 kg)     BP 113/74 mmHg  Pulse 90  Ht 5' 4.76" (1.645 m)  Wt 116 lb (52.617 kg)  BMI 19.44 kg/m2  LMP 03/30/2015 Body mass index: body mass index is 19.44 kg/(m^2). Blood pressure percentiles are 65% systolic and 85% diastolic based on 2000 NHANES data. Blood pressure percentile targets: 90: 122/77, 95: 126/81, 99 + 5 mmHg: 138/93.  Physical Exam  Constitutional: No distress.  Neck: No thyromegaly present.  Cardiovascular: Normal rate and regular rhythm.   No murmur heard. Pulmonary/Chest: Breath sounds normal.  Abdominal: Soft. There is no tenderness. There is no guarding.  Musculoskeletal: She exhibits no edema.  Lymphadenopathy:    She has no cervical adenopathy.  Neurological: She is alert.  Nursing note and vitals reviewed.   PHQ-SADS 04/29/2015 04/13/2015  PHQ-15 12 14   GAD-7 7 9   PHQ-9 9 8   Comment Somewhat difficult Somewhat difficult    Assessment/Plan: 19 yo female with anorexia nervosa.  Weight has continued to decrease.  She continues to express reluctance to gain weight.  Discussed need for more caloric intake than usual to replace all that was lost during restriction.  Discussed intensification in treatment is indicated by adding zyprexa or considering IOP.  Pt would like to try adding zyprexa.  Will also consider adding ativan prn at mealtimes to reduce anxiety associated with meals.  Discussed increasing family involvement in food/meal prep.  Discussed that school may need to be notified if patient is continuing to lose weight and is not safe to attend school.  1. Malnutrition of mild degree [name]:  75% to less than 90% of standard weight) ([facility]) 2. Anorexia nervosa - Cont prozac - Start OLANZapine (ZYPREXA) 2.5 MG tablet; Take 1 tablet (2.5 mg total) by mouth at bedtime.  Dispense: 30 tablet; Refill: 0  3. Orthostatic hypotension Reviewed importance of adequate intake  4. Screening for genitourinary condition - POCT urinalysis dipstick  5. Medication monitoring encounter As below, labs are required for medication monitoring - Hemoglobin A1c - Lipid panel - Comprehensive metabolic panel - CBC with Differential - Amylase    Monitoring Guidelines for Zyprexa - Hgba1c at baseline, 3 months after initiation, then annually if normal, every 3 months if abnormal:  Due NOW - Lipids at baseline, 3 months after initiation, then every 2 years if normal, annually if abnormal:  Due NOW - CMP annually if normal, as needed if abnormal:  Due NOW  - CBC annually if normal, as needed if abnormal:  Due NOW  - Prolactin if change in menstruation, libido, development of  galactorrhea, erectile and ejaculatory function  - Ophthalmologic exam every 2 years:  Due to be reviewed at future visit if on zyprexa long-term   Follow-up:  Return in about 1 week (around 05/06/2015) for DE f/u with extended vitals.   Medical decision-making:  > 40 minutes spent, more than 50% of appointment was spent discussing diagnosis and management of symptoms

## 2015-04-29 NOTE — Progress Notes (Signed)
Pre-Visit Planning  KAMERIN AXFORD  is a 19 y.o. female referred by Norman Clay, MD.   Last seen in [facility] Clinic on 04/13/2015 for anorexia nervosa, orthostatic hypotension, adjustment disorder.   Previous Psych Screenings?  yes, 04/13/2015  Treatment plan at last visit included continue current fluoxetine at current dose but consider increasing in near future.     Clinical Staff Visit Tasks:   - Urine GC/CT due? no - Psych Screenings Due? yes, PHQSADs - DE intake with extended vitals  Provider Visit Tasks: - Assess disordered eating behaviors - Assess current level of care  - Pertinent Labs? No  From Dietitian note: Growth Metrics: Ideal BMI for age: 40.6 BMI 03/16/15: 20.0% Ideal: 92% Previous growth data: weight/age 36-90th%; height/age at 75%; BMI/age 3-85% Goal BMI range based on growth chart data: 22-25 Goal weight range based on growth chart data: 130-145 lb Goal rate of weight gain: 0.5 lb/week  Weight history:  Highest weight: ~128 lbLowest (adult) weight: ~112 lb

## 2015-04-29 NOTE — Progress Notes (Signed)
Appointment start time: 1430  Appointment end time: 1500  Patient was seen on 04/29/15 for nutrition counseling pertaining to disordered eating.  She is accompanied by her mother  Primary care provider: Dr. Rana Snare Therapist: Noni Saupe, weekly    Any other medical team members: Dr. Marina Goodell Parents: [name]  Assessment:  Had a good weekend Thinks she did good with adding juice a few days; she was not able to add the frappacino at all.  Added pumpkin spice latte one day.  However, weight has decreased.   Growth Metrics: Ideal BMI for age: 34.6 BMI 04/29/15: 19.44 % Ideal:  90% Previous growth data: weight/age  79-90th%; height/age at 75%; BMI/age 26-85% Goal BMI range based on growth chart data: 22-25 Goal weight range based on growth chart data: 130-145 lb Goal rate of weight gain:  0.5 lb/week   Mental health diagnosis: AN, B/P subtype   Dietary assessment: mom says she's always been picky; currently following gluten-free diet  Safe foods include: salad with minimal dressing, fruits, oatmeal, rice chex Avoided foods include:desserts, meat, pasta, fast food, chips, pizza, bread, all other potatoes besides baked  24 hour recall B: carnation L: 1 cup Tomato soup made with water with cottage cheese (big spoon) S: special k D: salad with cheese, 1/2 deveiled egg, some blakc bean salsa, 4 tortilla chips S: pumpkin fluff with peacans  Tuesday B: quest bar L: leftover squash soup, 6 grapes S: banana nut bread D: salad, boiled eggs, pecans, cheese, tomato, honey mustard dressing S: coffee with extra cream B: banana and juice   What Methods Do You Use To Control Your Weight (Compensatory behaviors)?           Restricting (calories, fat, carbs): doesn't stick to specific number, but eats ~[phone] kcal/day  SIV: previously 2-6 times/week.  None currently, Can't remember last time.    Estimated energy intake: [phone] kcal  Estimated energy needs: 2200 kcal for weight  restoration    275 g CHO 110 g pro 73 g fat  Nutrition Diagnosis: NI-1.4 Inadequate energy intake As related to restricting and purging.  As evidenced by weight loss of 16 pounds .  Intervention/Goals:  Reiterated messages from adolescent medicine: a higher level of care may be needed if she is not able to make progress at current level of care. Right now her eating disorder voice is really strong and it may be to her benefit to get additional support.  Discussed that her energy needs are higher now than before because she has to regain muscle mass, not just fat.  She nodded in agreement throughout the visit and did not disagree at any point.  When asked what could be done to increase her energy so that she can stay at home, she stated she didn't know.  Since she was able to add juice sometime, asked if she could add juice twice daily?  She agreed.  Asked how family could support that and it was agreed that family would remind her to drink juice as it was "brain juice" to keep her in school  Recommended various organizations to follow on social media: [employer], [employer], [employer]  Recommended resources for parents: [employer] parent toolkit, Help Your Teenager Beat and Eating disorder by Dr. Adaline Sill   Monitoring and Evaluation: Patient will follow up in 1 week

## 2015-04-30 ENCOUNTER — Telehealth: Payer: Self-pay | Admitting: *Deleted

## 2015-04-30 NOTE — Telephone Encounter (Signed)
Noted, thank you

## 2015-04-30 NOTE — Telephone Encounter (Signed)
Mom called and left a voicemail stating that she would like to let Dr. Sharene Skeans know that Jessica [name] has been diagnosed with anorexia since their last visit to our office. Mom reports on voicemail that patient will be seen on Wednesday of next week and she will need to be facing away from the scale in order to allow progression in treatment from Dr. Delorse Lek. Mom also stated in the message that she just wanted to let us know of this information but did not want Tamyia to be aware that she had called and advised [place] of such.   There was no information disclosed from [place] to patient's mother as voicemail was left and mother did not request a call back.

## 2015-04-30 NOTE — Telephone Encounter (Signed)
Spoke with mother.  Pt is excessively sleepy on 2.5 mg of zyprexa.  Advised to skip dose tonight and restart tomorrow night at 1/2 of that dose.  Advised to send my chart message after this change.

## 2015-04-30 NOTE — Telephone Encounter (Signed)
VM from mom. States that [place] took her first dose of Zyprexa last night. Mom states that pt was barely able to get up this morning for her 8:00 class. Mom would like to discuss med adjustments. [phone].

## 2015-05-03 ENCOUNTER — Telehealth: Payer: Self-pay | Admitting: Pediatrics

## 2015-05-03 NOTE — Telephone Encounter (Signed)
Spoke with patient's mother.  Patient was in the background working on homework.  Pt decreased zyprexa dose to 1.25 mg and has not experienced any fatigue with this change.  Pt has not noted any significant reduction in anxiety yet.  She will follow-up with [place] in 4 days.  Mother reports that patient has been experiencing itchy skin over the past month and asked if this was related to medication or to the eating disorder.  We discussed the delay in skin turnover with malnutrition.  We will evaluate further at her upcoming visit.

## 2015-05-04 ENCOUNTER — Encounter: Payer: Self-pay | Admitting: Family

## 2015-05-04 ENCOUNTER — Encounter: Payer: BLUE CROSS/BLUE SHIELD | Attending: Pediatrics | Admitting: *Deleted

## 2015-05-04 DIAGNOSIS — E441 Mild protein-calorie malnutrition: Secondary | ICD-10-CM

## 2015-05-04 DIAGNOSIS — F509 Eating disorder, unspecified: Secondary | ICD-10-CM | POA: Diagnosis present

## 2015-05-04 DIAGNOSIS — Z713 Dietary counseling and surveillance: Secondary | ICD-10-CM | POA: Insufficient documentation

## 2015-05-04 DIAGNOSIS — F5000 Anorexia nervosa, unspecified: Secondary | ICD-10-CM

## 2015-05-04 NOTE — Progress Notes (Signed)
Patient ID: Jessica [name], female   DOB: 1996/01/23, 19 y.o.   MRN: [id number] Pre-Visit Planning  Jessica [name]  is a 19 y.o. female referred by Norman Clay, MD.   Last seen [facility] on 04/29/2015 for AN, orthostatic hypotension, adjustment disorder.    Previous Psych Screenings?  PHQ-SADS 04/29/2015 04/13/2015  PHQ-15 12 14   GAD-7 7 9   PHQ-9 9 8   Comment Somewhat difficult Somewhat difficult         Treatment plan at last visit included continue Prozac, initiated Zyprexa 2.5 mg at HS. Reviewed intake to maintain normotensive state.   Clinical Staff Visit Tasks:   - Urine GC/CT due? No, negative 04/13/15. - Psych Screenings Due? no - EVS  Provider Visit Tasks: - assess Zyprexa use, itchiness as below, review VS, intake  - Pertinent Labs? Yes Lab Results  Component Value Date   CHOL 187* 04/29/2015   HDL 38 04/29/2015   LDLCALC 126* 04/29/2015   TRIG 116 04/29/2015   CHOLHDL 4.9 04/29/2015     Chemistry      Component Value Date/Time   NA 140 04/29/2015 1442   K 4.2 04/29/2015 1442   CL 106 04/29/2015 1442   CO2 24 04/29/2015 1442   BUN 15 04/29/2015 1442   CREATININE 0.74 04/29/2015 1442   CREATININE 0.66 06/19/2011 1242      Component Value Date/Time   CALCIUM 9.9 04/29/2015 1442   ALKPHOS 31* 04/29/2015 1442   AST 16 04/29/2015 1442   ALT 19 04/29/2015 1442   BILITOT 0.6 04/29/2015 1442      Owens Shark, MD at 05/03/2015 2:28 PM     Status: Signed       Expand All Collapse All   Spoke with patient's mother. Patient was in the background working on homework. Pt decreased zyprexa dose to 1.25 mg and has not experienced any fatigue with this change. Pt has not noted any significant reduction in anxiety yet. She will follow-up with [place] in 4 days. Mother reports that patient has been experiencing itchy skin over the past month and asked if this was related to medication or to the eating disorder. We discussed the  delay in skin turnover with malnutrition. We will evaluate further at her upcoming visit.

## 2015-05-04 NOTE — Progress Notes (Signed)
Appointment start time: 1400  Appointment end time: 1430  Patient was seen on 05/04/15 for nutrition counseling pertaining to disordered eating.  She is accompanied by her mother  Primary care provider: Dr. Rana Snare Therapist: Noni Saupe, weekly    Any other medical team members: Dr. Marina Goodell Parents: Jessica [name]  Assessment:  Jessica [name] is in good spirits.  She feels better about her school work; it's not so stressful.  Therapist recommended dropping 2 classes, but that stresses her out more than the classes do.  Mom confirms [name] seems to be managing her school stress better.  She has been doing a good job with drinking caloric beverages and it appears she has increased her portions.  She denies any questions or concerns    Weight per [facility] scale: 118 lb  Growth Metrics: Ideal BMI for age: 13.6 BMI 05/04/15: 19.6 % Ideal:  90.8% Previous growth data: weight/age  102-90th%; height/age at 75%; BMI/age 48-85% Goal BMI range based on growth chart data: 22-25 Goal weight range based on growth chart data: 130-145 lb Goal rate of weight gain:  0.5 lb/week   Mental health diagnosis: AN, B/P subtype   Dietary assessment: mom says she's always been picky; currently following gluten-free diet  Safe foods include: salad with minimal dressing, fruits, oatmeal, rice chex Avoided foods include:desserts, meat, pasta, fast food, chips, pizza, bread, all other potatoes besides baked  24 hour recall B: Lara bar L: ~2 cups boiled egg, avocado, olives, peacan, balsamic vingarette Apple with 2 tbsp peanut butter 4 grilled nuggets, fruit cup Coffee with extra pumpkin and cream Grape juice Banana with 1 tbsp peanut butter  Sunday Kind bar 2 cups Veggie soup with chicken, beans, potatoes Coffee with pumpkin and cream Apple with peanut butter Salad with chicken from subway: olives, dressing, cheese Greek yogurt Sprite Lemonade  Saturday Pumpkin yogurt with granola Leftover salad Apple Vegetable  soup with chicken Pumpkin spice coffee (homemade) Salted caramel mocha  Friday luna bar (slept forever) Rice chex with banana with soy milk ghassan (1/2 salad)  Thursday CIB luna bar Handful trail mix Coffee Veggie burger with cheese, applesauce, root beer  Today CIB 2 cups tomoato soup with cottage cheese   What Methods Do You Use To Control Your Weight (Compensatory behaviors)?           Restricting (calories, fat, carbs): doesn't stick to specific number, but eats ~3371526410 kcal/day  SIV: previously 2-6 times/week.  None currently, Can't remember last time.    Estimated energy intake: 1400 kcal  Estimated energy needs: 2200 kcal for weight restoration    275 g CHO 110 g pro 73 g fat  Nutrition Diagnosis: NI-1.4 Inadequate energy intake As related to restricting and purging.  As evidenced by weight loss of 16 pounds .  Intervention/Goals:  Encouraged her to challenge her ED voice  Discussed ways she can increase without feeling stuffed: Drink soy milk with her bar at breakfast Add more peanut butter or other condiments Add granola to her yogurt  She agreed with a smile   Monitoring and Evaluation: Patient will follow up in 1 week

## 2015-05-05 ENCOUNTER — Encounter: Payer: Self-pay | Admitting: Pediatrics

## 2015-05-05 ENCOUNTER — Ambulatory Visit (INDEPENDENT_AMBULATORY_CARE_PROVIDER_SITE_OTHER): Payer: BLUE CROSS/BLUE SHIELD | Admitting: Pediatrics

## 2015-05-05 VITALS — BP 92/64 | HR 76 | Ht 64.5 in | Wt 118.4 lb

## 2015-05-05 DIAGNOSIS — G43009 Migraine without aura, not intractable, without status migrainosus: Secondary | ICD-10-CM

## 2015-05-05 DIAGNOSIS — R000 Tachycardia, unspecified: Secondary | ICD-10-CM

## 2015-05-05 DIAGNOSIS — G90A Postural orthostatic tachycardia syndrome (POTS): Secondary | ICD-10-CM

## 2015-05-05 DIAGNOSIS — F5000 Anorexia nervosa, unspecified: Secondary | ICD-10-CM

## 2015-05-05 DIAGNOSIS — G44219 Episodic tension-type headache, not intractable: Secondary | ICD-10-CM | POA: Diagnosis not present

## 2015-05-05 DIAGNOSIS — I951 Orthostatic hypotension: Secondary | ICD-10-CM

## 2015-05-05 NOTE — Progress Notes (Signed)
Patient: Jessica [name] MRN: [id number] Sex: female DOB: 10-13-1995  Provider: Deetta Perla, MD Location of Care: [facility] Child Neurology  Note type: Routine return visit  History of Present Illness: Referral Source: [name], MD History from: mother, patient and [facility] chart Chief Complaint: Migraines  Jessica [name] is a 19 y.o. female who returns today for headache follow-up after changing to Qudexy on 04/06/15. She has migraine without aura, episodic tension-type headaches, vasovagal syncope with orthostatic intolerance similar to POTS. She also was recently diagnosed with anorexia and is currently being treated by Dr. Marina Goodell.  She states she is doing very well from a headache standpoint with her last migraine headache on 04/19/15. She continues to have 3-4 tension headaches a week. She takes 2 ibuprofen daily for these headaches. She used to take more, but has cut back for fear of rebound headaches. Sometimes the ibuprofen helps, other times it doesn't. She drinks 48oz of water a day. She sleeps 6-8 hours a night. She does eat breakfast. She doesn't get a lot of exercise. She has worse headaches on days when she uses her computer a lot for class. However, overall she is well please with where her headaches are.  Review of Systems: 12 system review was unremarkable  Past Medical History Diagnosis Date  . POTS (postural orthostatic tachycardia syndrome)   . Migraine headache   . Syncope 09/22/2014   Hospitalizations: No., Head Injury: No., Nervous System Infections: No., Immunizations up to date: Yes.    Evaluated by Dr. Darlis Loan on April 08, 2012. He noted a previous evaluation, June 22, 2011 for syncope. EKG and echocardiogram were normal. 24-hour Holter monitor was normal. She had persistent episodes of tachycardia and dizziness following the evaluation. She had initial improvement with Florinef; however, on 0.3 mg per day, she did not have persistent  improvement. She complained of three episodes of loss of consciousness per month.   On March 04, 2012, she had an episode of stiffening which was unusual. A decision was made to place her on atenolol when her pulse went up from 68 to 105 from supine to standing.  ER visit on 10/26/14 due to migraine.   Behavior History anorexia  Surgical History Procedure Laterality Date  . Eye surgery  2000    clogged tear duct  . Anterior cruciate ligament repair  03/2010  . Wisdom tooth extraction     Family History family history includes Breast cancer in her maternal aunt; Cancer in her maternal aunt and maternal grandfather; Depression in an other family member; Diabetes in her maternal grandfather and maternal grandmother; Eating disorder in her maternal aunt; Hypertension in her father; Kidney Stones in her father; Mental retardation in her other; Migraines in her maternal aunt, maternal uncle, and mother; Other in her other; Seizures in her father and mother; Stroke in her maternal grandfather. Family history is negative for blindness, deafness, birth defects, chromosomal disorder, or autism. Multiple family members with near angle glaucoma.  Social History . Marital Status: Single    Spouse Name: N/A  . Number of Children: N/A  . Years of Education: N/A   Social History Main Topics  . Smoking status: Never Smoker   . Smokeless tobacco: Never Used  . Alcohol Use: No  . Drug Use: No  . Sexual Activity: No   Social History Narrative    Alyissa is a sophomore at [employer] and does well in school. She lives with her parents and siblings. She enjoys school,  shopping,watching TV, and helping with children at church.    Allergies Allergen Reactions  . Amoxicillin Rash   Physical Exam BP 92/64 mmHg  Pulse 76  Ht 5' 4.5" (1.638 m)  Wt 118 lb 6.4 oz (53.706 kg)  BMI 20.02 kg/m2  LMP 03/30/2015  General: alert, well developed, well nourished, in no acute distress, black hair, brown eyes Head:  normocephalic, no dysmorphic features Ears, Nose and Throat: Otoscopic: tympanic membranes normal; pharynx: oropharynx is pink without exudates or tonsillar hypertrophy Neck: supple, full range of motion, no cranial or cervical bruits Respiratory: auscultation clear Cardiovascular: no murmurs, pulses are normal Musculoskeletal: no skeletal deformities or apparent scoliosis Skin: no rashes or neurocutaneous lesions  Neurologic Exam  Mental Status: alert; oriented to person, place and year; knowledge is normal for age; language is normal Cranial Nerves: visual fields are full to double simultaneous stimuli; extraocular movements are full and conjugate; pupils are round reactive to light; funduscopic examination shows sharp disc margins with normal vessels; symmetric facial strength; midline tongue and uvula; air conduction is greater than bone conduction bilaterally Motor: Normal strength, tone and mass; good fine motor movements; no pronator drift Sensory: intact responses to cold, vibration, proprioception and stereognosis Coordination: good finger-to-nose, rapid repetitive alternating movements and finger apposition Gait and Station: normal gait and station: patient is able to walk on heels, toes and tandem without difficulty; balance is adequate; Romberg exam is negative; Gower response is negative Reflexes: symmetric and diminished bilaterally; no clonus; bilateral flexor plantar responses  Assessment 1.  Migraine without aura and without status migrainosus, not intractable, G43.009. 2.  Episodic tension type headache, not intractable, G4 4.219. 3.  Postural orthostatic tachycardia syndrome, R00,0, I95.1. 4.  Anorexia nervosa, F50.00  Discussion Petronella has responded well to Qudexy.  I have no plans to change her current preventative or abortive treatment.  She will continue to keep daily prospective headache calendars and will send them the numbers of migraines increases.  Her most  important medical problem at this time is anorexia which is being ably treated by Dr. Delorse Lek.  Plan She will return in 3 months for routine visit.   Medication List     This list is accurate as of: 05/05/15 11:59 PM.       atenolol 25 MG tablet  Commonly known as:  TENORMIN  1 tablet in the morning     fludrocortisone 0.1 MG tablet  Commonly known as:  FLORINEF  Take 1 tablet by mouth daily     FLUoxetine 20 MG tablet  Commonly known as:  PROZAC  Take 1.5 tablets (30 mg total) by mouth daily.     JUNEL FE 1.5/30 1.5-30 MG-MCG tablet  Generic drug:  norethindrone-ethinyl estradiol-iron  Take 1 tablet by mouth daily.     MULTIVITAMIN ADULT PO  Take by mouth.     OLANZapine 2.5 MG tablet  Commonly known as:  ZYPREXA  Take 1 tablet (2.5 mg total) by mouth at bedtime.     polyethylene glycol packet  Commonly known as:  MIRALAX / GLYCOLAX  Take 17 g by mouth daily.     QUDEXY XR 100 MG Cs24  Generic drug:  Topiramate ER  One tablet at nighttime     VIACTIV PO  Take by mouth.      The medication list was reviewed and reconciled. All changes or newly prescribed medications were explained.  A complete medication list was provided to the patient/caregiver.  Karmen Stabs, MD [facility]  Primary Care Pediatrics, PGY-2  30 minutes of face-to-face time was spent with Shatyra and her mother, more than half of it in consultation.  I performed physical examination, participated in history taking, and guided decision making.  05/09/2015  8:49 AM

## 2015-05-06 ENCOUNTER — Telehealth: Payer: Self-pay | Admitting: *Deleted

## 2015-05-06 NOTE — Telephone Encounter (Signed)
Mom lvm that [name] saw her weight at neuro appointment and was angry.  [name] feels she gained weight and thus treatment team is lying to her.  This provider returned the call and lvm that while Jessica [name] has gained 2 pounds recently, she had lost 5 pounds previously, thus she is not back to her original weight.  Her treatment team was concerned about that 5 pound weight loss

## 2015-05-07 ENCOUNTER — Encounter: Payer: Self-pay | Admitting: Family

## 2015-05-07 ENCOUNTER — Ambulatory Visit (INDEPENDENT_AMBULATORY_CARE_PROVIDER_SITE_OTHER): Payer: BLUE CROSS/BLUE SHIELD | Admitting: Family

## 2015-05-07 ENCOUNTER — Institutional Professional Consult (permissible substitution): Payer: BLUE CROSS/BLUE SHIELD | Admitting: Pediatrics

## 2015-05-07 VITALS — BP 109/78 | HR 80 | Ht 64.76 in | Wt 119.2 lb

## 2015-05-07 DIAGNOSIS — Z1389 Encounter for screening for other disorder: Secondary | ICD-10-CM | POA: Diagnosis not present

## 2015-05-07 DIAGNOSIS — E441 Mild protein-calorie malnutrition: Secondary | ICD-10-CM | POA: Diagnosis not present

## 2015-05-07 DIAGNOSIS — F5000 Anorexia nervosa, unspecified: Secondary | ICD-10-CM | POA: Diagnosis not present

## 2015-05-07 LAB — POCT URINALYSIS DIPSTICK
Bilirubin, UA: NEGATIVE
Glucose, UA: NEGATIVE
KETONES UA: NEGATIVE
LEUKOCYTES UA: NEGATIVE
Nitrite, UA: NEGATIVE
PH UA: 6
SPEC GRAV UA: 1.015
Urobilinogen, UA: NEGATIVE

## 2015-05-07 NOTE — Progress Notes (Signed)
THIS RECORD MAY CONTAIN CONFIDENTIAL INFORMATION THAT SHOULD NOT BE RELEASED WITHOUT REVIEW OF THE SERVICE PROVIDER.  Adolescent Medicine Consultation Follow-Up Visit Jessica [name]  is a 19 y.o. female referred by Jessica Mast, MD here today for follow-up of anorexia.     Growth Chart Viewed? yes   History was provided by the patient.  PCP Confirmed?  Yes, Jessica Mast, MD   My Chart Activated?   no   Previsit planning completed:  Yes  Patient ID: Jessica [name], female   DOB: [birth year], 19 y.o.   MRN: [id number] Pre-Visit Planning  Jessica [name]  is a 19 y.o. female referred by Jessica Clay, MD.   Last seen in [facility] on 04/29/2015 for AN, orthostatic hypotension, adjustment disorder.    Previous Psych Screenings?  PHQ-SADS 04/29/2015 04/13/2015  PHQ-15 12 14   GAD-7 7 9   PHQ-9 9 8   Comment Somewhat difficult Somewhat difficult         Treatment plan at last visit included continue Prozac, initiated Zyprexa 2.5 mg at HS. Reviewed intake to maintain normotensive state.   Clinical Staff Visit Tasks:   - Urine GC/CT due? No, negative 04/13/15. - Psych Screenings Due? no - EVS  Provider Visit Tasks: - assess Zyprexa use, itchiness as below, review VS, intake  - Pertinent Labs? Yes Lab Results  Component Value Date   CHOL 187* 04/29/2015   HDL 38 04/29/2015   LDLCALC 126* 04/29/2015   TRIG 116 04/29/2015   CHOLHDL 4.9 04/29/2015     Chemistry      Component Value Date/Time   NA 140 04/29/2015 1442   K 4.2 04/29/2015 1442   CL 106 04/29/2015 1442   CO2 24 04/29/2015 1442   BUN 15 04/29/2015 1442   CREATININE 0.74 04/29/2015 1442   CREATININE 0.66 06/19/2011 1242      Component Value Date/Time   CALCIUM 9.9 04/29/2015 1442   ALKPHOS 31* 04/29/2015 1442   AST 16 04/29/2015 1442   ALT 19 04/29/2015 1442   BILITOT 0.6 04/29/2015 1442      Jessica Shark, MD at 05/03/2015 2:28 PM     Status: Signed       Expand All  Collapse All   Spoke with patient's mother. Patient was in the background working on homework. Pt decreased zyprexa dose to 1.25 mg and has not experienced any fatigue with this change. Pt has not noted any significant reduction in anxiety yet. She will follow-up with [place] in 4 days. Mother reports that patient has been experiencing itchy skin over the past month and asked if this was related to medication or to the eating disorder. We discussed the delay in skin turnover with malnutrition. We will evaluate further at her upcoming visit.         HPI:    -Mom states things are about the same.  -Feels like things are better since last OV last week.   With mom out of room, Jessica [name] reports:   -Following Jessica [name]'s diet "for the most part" - adding granola to yogurt -added coffee and juice beverages -no exercise or additional movement. -Next appt with Jessica [name] is next Thursday; here again next Fri -anxiety is same or less than it was before.  -the itching has resolved spontaneously; she has itching until this week; feels it was related to a rash on her stomach she had several weeks ago. Rash resolved spontaneously. No new skin or laundry products.  -tolerating the decreased dose of Zyprexa.  Feels less groggy with the 1.25 mg dose.  -patient saw her weight at Neuro visit and at that time questioned if we were lying to her about her weight.   Patient's last menstrual period was 05/04/2015. Allergies  Allergen Reactions  . Amoxicillin Rash   Current Outpatient Prescriptions on File Prior to Visit  Medication Sig Dispense Refill  . atenolol (TENORMIN) 25 MG tablet 1 tablet in the morning    . Calcium-Vitamin D-Vitamin K (VIACTIV PO) Take by mouth.    . fludrocortisone (FLORINEF) 0.1 MG tablet Take 1 tablet by mouth daily 90 tablet 5  . FLUoxetine (PROZAC) 20 MG tablet Take 1.5 tablets (30 mg total) by mouth daily. 45 tablet 1  . Multiple Vitamins-Minerals (MULTIVITAMIN ADULT PO) Take by mouth.     . norethindrone-ethinyl estradiol-iron (JUNEL FE 1.5/30) 1.5-30 MG-MCG tablet Take 1 tablet by mouth daily. 1 Package 11  . OLANZapine (ZYPREXA) 2.5 MG tablet Take 1 tablet (2.5 mg total) by mouth at bedtime. (Patient taking differently: Take 1.25 mg by mouth at bedtime. ) 30 tablet 0  . polyethylene glycol (MIRALAX / GLYCOLAX) packet Take 17 g by mouth daily.    . QUDEXY XR 100 MG CS24 One tablet at nighttime 30 each 5   No current facility-administered medications on file prior to visit.    Confidentiality was discussed with the patient and if applicable, with caregiver as well.  Patient's personal or confidential phone number: on file   The following portions of the patient's history were reviewed and updated as appropriate: allergies, current medications, past family history, past medical history, past social history, past surgical history and problem list.  Tobacco? no Drugs/ETOH? no Partner preference? female Sexually Active? no  Pregnancy Prevention: birth control pills, reviewed condoms & plan B Safe at home, in school & in relationships? Yes Safe to self? Yes   Physical Exam:  Filed Vitals:   05/07/15 1429 05/07/15 1444 05/07/15 1445  BP:  102/62 109/78  Pulse:  66 80  Height: 5' 4.76" (1.645 m)    Weight: 119 lb 3.2 oz (54.069 kg)     BP 109/78 mmHg  Pulse 80  Ht 5' 4.76" (1.645 m)  Wt 119 lb 3.2 oz (54.069 kg)  BMI 19.98 kg/m2  LMP 05/04/2015 Body mass index: body mass index is 19.98 kg/(m^2). Blood pressure percentiles are 51% systolic and 92% diastolic based on 2000 NHANES data. Blood pressure percentile targets: 90: 122/77, 95: 126/81, 99 + 5 mmHg: 138/93.  Physical Exam Constitutional: No distress. Pleasantly interactive.  Neck: No thyromegaly present.  Cardiovascular: Normal rate and regular rhythm.  No murmur heard. Pulmonary/Chest: Breath sounds normal.  Abdominal: Soft. There is no tenderness. There is no guarding.  Musculoskeletal: She  exhibits no edema.  Lymphadenopathy: She has no cervical adenopathy.  Neurological: She is alert.  Nursing note and vitals reviewed.   Assessment/Plan: 1. Anorexia nervosa -Improvement, weight is up since addition of Zyprexa and following meal plan.  -Discussed issue of her seeing her weight at the Neuro visit; led into conversation about discussing weight as part of the recovery process, whenever she feels that she is ready for that. She verbalized an understanding of this process, weight fluctuations, and that she does not feel we are lying to her about her weight. She did not ask to know her weight at this OV.  -No med changes at this time.  2. Malnutrition of mild degree [name]: 75% to less than 90% of standard weight) (HCC)  -  as above; continue medications and keep scheduled appts.  3. Screening for genitourinary condition -As per protocol; negative ketones - POCT urinalysis dipstick   Follow-up:  Next Friday with Christianne Dolinhristy Millican, FNP-C   Medical decision-making:  > 25 minutes spent, more than 50% of appointment was spent discussing diagnosis and management of symptoms

## 2015-05-11 ENCOUNTER — Encounter: Payer: Self-pay | Admitting: Pediatrics

## 2015-05-11 DIAGNOSIS — G43001 Migraine without aura, not intractable, with status migrainosus: Secondary | ICD-10-CM

## 2015-05-13 ENCOUNTER — Encounter: Payer: BLUE CROSS/BLUE SHIELD | Admitting: *Deleted

## 2015-05-13 ENCOUNTER — Encounter: Payer: Self-pay | Admitting: Family

## 2015-05-13 DIAGNOSIS — F5000 Anorexia nervosa, unspecified: Secondary | ICD-10-CM

## 2015-05-13 DIAGNOSIS — F509 Eating disorder, unspecified: Secondary | ICD-10-CM | POA: Diagnosis not present

## 2015-05-13 NOTE — Progress Notes (Signed)
Appointment start time: 1400  Appointment end time: 1500  Patient was seen on 05/13/15 for nutrition counseling pertaining to disordered eating.  She is accompanied by her mother  Primary care provider: Dr. Rana SnareLowe Therapist: Noni SaupeHeather Kitchen, weekly    Any other medical team members: Dr. Marina GoodellPerry Parents: Bonnita LevanJoy Thomas  Assessment:  Jessica CousinsCiara is in good spirits today.  School is not as stressful.  She's been trying hard to increase her food intake this week.  Las Friday she did have an episode where she passed out at a football game.  She had been standing for along period and had started her menstrual cycle earlier in the week.  This was her first "normal" cycle in a very long time.  She had eaten according to her meal plan that day, and had a cup of hot chocolate.  She lost consciousness not long after the hot chocolate and the EMS arrived, she was found to have hypoglycemia  She also has gained acces to MyChart and she is able to see her weight.  She knows she has gained some weight.  One day this week, she was still hungry after eating her apple so she ate a slice of banana bread. Later that night, she didn't eat her snack to compensate, and was hungry.  She misses pizza    Growth Metrics: Ideal BMI for age: 4521.6 BMI 05/04/15: 19.6 % Ideal:  90.8% Previous growth data: weight/age  65-90th%; height/age at 75%; BMI/age 75-85% Goal BMI range based on growth chart data: 22-25 Goal weight range based on growth chart data: 130-145 lb Goal rate of weight gain:  0.5 lb/week   Mental health diagnosis: AN, B/P subtype   Dietary assessment: mom says she's always been picky; currently following gluten-free diet  Safe foods include: salad with minimal dressing, fruits, oatmeal, rice chex Avoided foods include:desserts, meat, pasta, fast food, chips, pizza, bread, all other potatoes besides baked  24 hour recall B: Luna bar S: salted caramel coffee with cream L: grilled chicken salad with zaxby's (ate  it all) S: apple with nutella D: salad and tomato soup S: peanut M&Ms and yogurt  Tuesday B: rice check with banana and soy milk S: 7-8 bagel chips with pimento cheese L: apple with peantu butter, banana bread S: slated carmael coffee with cream D: 5 grilled nuggets, fruit cups, sprite S: 5 M&Ms  Today B: Luna bar L: salad with boiled egg, pecans and olives with raspberry dressing and cheese    Estimated energy intake: 1600 kcal  Estimated energy needs: 2200 kcal for weight restoration    275 g CHO 110 g pro 73 g fat  Nutrition Diagnosis: NI-1.4 Inadequate energy intake As related to restricting and purging.  As evidenced by weight loss of 16 pounds .  Intervention/Goals: Nutrition counseling provided.  Provided feedback on increased hunger cues.  Provided education on her "weight gain" (1 net pound in 2 months).  Suggested not looking at her weight on MyChart.  Encouraged her to challenge her ED voice Add fruit or shake to breakfast Try homemade pizza   Monitoring and Evaluation: Patient will follow up in 1 week

## 2015-05-13 NOTE — Progress Notes (Signed)
Patient ID: Marland KitchenCiara J Thomas, female   DOB: 1996-06-22, 19 y.o.   MRN: [id number] Pre-Visit Planning  Marland KitchenCiara J Thomas  is a 19 y.o. female referred by Norman ClayLOWE,[name] V, MD.   Last seen [facility] on 05/07/15 for DE.   Previous Psych Screenings?  no  Treatment plan at last visit included continue medications/no change in POC. Nutritional status stable.  She was seen on 05/13/15 by Denny LevyLaura Reavis, RD. Note reviewed. Agree with POC.   Clinical Staff Visit Tasks:   - Urine GC/CT due? No, negative screen on 04/13/15.  - Psych Screenings Due? no - EVS    Provider Visit Tasks: - Evaluate if OK to share last labs with mother - Address confidentiality & consent again with mother and daughter.  - Evaluate nutritional status, DE symptoms  - Pertinent Labs? no

## 2015-05-14 ENCOUNTER — Ambulatory Visit (INDEPENDENT_AMBULATORY_CARE_PROVIDER_SITE_OTHER): Payer: BLUE CROSS/BLUE SHIELD | Admitting: Pediatrics

## 2015-05-14 ENCOUNTER — Encounter: Payer: Self-pay | Admitting: Family

## 2015-05-14 VITALS — BP 100/69 | HR 67 | Ht 65.5 in | Wt 120.4 lb

## 2015-05-14 DIAGNOSIS — Z1389 Encounter for screening for other disorder: Secondary | ICD-10-CM

## 2015-05-14 DIAGNOSIS — F5000 Anorexia nervosa, unspecified: Secondary | ICD-10-CM | POA: Diagnosis not present

## 2015-05-14 DIAGNOSIS — G44219 Episodic tension-type headache, not intractable: Secondary | ICD-10-CM

## 2015-05-14 DIAGNOSIS — E441 Mild protein-calorie malnutrition: Secondary | ICD-10-CM

## 2015-05-14 LAB — POCT URINALYSIS DIPSTICK
Bilirubin, UA: NEGATIVE
Glucose, UA: NORMAL
KETONES UA: NEGATIVE
Leukocytes, UA: NEGATIVE
Nitrite, UA: NEGATIVE
RBC UA: NEGATIVE
UROBILINOGEN UA: NEGATIVE

## 2015-05-14 MED ORDER — OLANZAPINE 2.5 MG PO TABS: 1.2500 mg | ORAL_TABLET | Freq: Every day | ORAL | Status: DC

## 2015-05-14 NOTE — Progress Notes (Signed)
THIS RECORD MAY CONTAIN CONFIDENTIAL INFORMATION THAT SHOULD NOT BE RELEASED WITHOUT REVIEW OF THE SERVICE PROVIDER.  Adolescent Medicine Consultation Follow-Up Visit Jessica [name]  is a 19 y.o. female referred by [name], MD here today for follow-up.    Growth Chart Viewed? yes   History was provided by the patient and mother.  PCP Confirmed?  yes  My Chart Activated?   yes   Previsit planning completed:  yes Patient ID: Jessica [name], female   DOB: 03-19-1996, 19 y.o.   MRN: [id number] Pre-Visit Planning  Jessica [name]  is a 19 y.o. female referred by Norman Clay, MD.   Last seen [facility] on 05/07/15 for DE.   Previous Psych Screenings?  no  Treatment plan at last visit included continue medications/no change in POC. Nutritional status stable.  She was seen on 05/13/15 by Denny Levy, RD. Note reviewed. Agree with POC.   Clinical Staff Visit Tasks:   - Urine GC/CT due? No, negative screen on 04/13/15.  - Psych Screenings Due? no - EVS    Provider Visit Tasks: - Evaluate if OK to share last labs with mother - Address confidentiality & consent again with mother and daughter.  - Evaluate nutritional status, DE symptoms  - Pertinent Labs? no  HPI:   Reports overall she is feeling better esp past few days, Has been feeling better the past few days.  Anxiety level has been better.  Sleeping well and not having difficulty waking up.   She had a bad headache for about 1 week, not like a migraine so did not take her triptan Across the front of her forehead and behind her eyes, she did have some nausea assoc although this occurred with her passing out and then faded over a few days The HA is there all the time, ibuprofen does not help, excedrin migraine helped some although completely Followed by Dr. Mayer Camel and Dr. Sharene Skeans for POTS.  Mother to contact both of them regarding most recent episode of fainting.    Reports she has increased her intake and  is having less anxiety about it.  Patient's last menstrual period was 05/04/2015. Allergies  Allergen Reactions  . Amoxicillin Rash   Current Outpatient Prescriptions on File Prior to Visit  Medication Sig Dispense Refill  . atenolol (TENORMIN) 25 MG tablet 1 tablet in the morning    . Calcium-Vitamin D-Vitamin K (VIACTIV PO) Take by mouth.    . fludrocortisone (FLORINEF) 0.1 MG tablet Take 1 tablet by mouth daily 90 tablet 5  . FLUoxetine (PROZAC) 20 MG tablet Take 1.5 tablets (30 mg total) by mouth daily. 45 tablet 1  . ibuprofen (ADVIL,MOTRIN) 200 MG tablet Take 200 mg by mouth every 6 (six) hours as needed.    . Multiple Vitamins-Minerals (MULTIVITAMIN ADULT PO) Take by mouth.    . norethindrone-ethinyl estradiol-iron (JUNEL FE 1.5/30) 1.5-30 MG-MCG tablet Take 1 tablet by mouth daily. 1 Package 11  . polyethylene glycol (MIRALAX / GLYCOLAX) packet Take 17 g by mouth daily.    . QUDEXY XR 100 MG CS24 One tablet at nighttime 30 each 5  . SUMAtriptan (IMITREX) 25 MG tablet Take 25 mg by mouth every 2 (two) hours as needed for migraine. May repeat in 2 hours if headache persists or recurs.     No current facility-administered medications on file prior to visit.   Social History: Confidentiality was discussed with the patient and if applicable, with caregiver as well.  Feels  she is having less anxiety and guilty Listening to her hunger cues more No SI  The following portions of the patient's history were reviewed and updated as appropriate: allergies, current medications, past social history and problem list.  Physical Exam:  Filed Vitals:   05/14/15 1359 05/14/15 1409  BP: 86/54 100/69  Pulse: 63 67  Height: 5' 5.5" (1.664 m)   Weight: 120 lb 5.9 oz (54.6 kg)    BP 100/69 mmHg  Pulse 67  Ht 5' 5.5" (1.664 m)  Wt 120 lb 5.9 oz (54.6 kg)  BMI 19.72 kg/m2  LMP 05/04/2015 Body mass index: body mass index is 19.72 kg/(m^2). Blood pressure percentiles are 18% systolic and 70%  diastolic based on 2000 NHANES data. Blood pressure percentile targets: 90: 123/77, 95: 127/81, 99 + 5 mmHg: 139/94.  Physical Exam  Constitutional: No distress.  Neck: No thyromegaly present.  Cardiovascular: Normal rate and regular rhythm.   No murmur heard. Pulmonary/Chest: Breath sounds normal.  Abdominal: Soft. There is no tenderness. There is no guarding.  Musculoskeletal: She exhibits no edema.  Lymphadenopathy:    She has no cervical adenopathy.  Neurological: She is alert.  Nursing note and vitals reviewed.   Assessment/Plan: 1. Malnutrition of mild degree [name](Gomez: 75% to less than 90% of standard weight) (HCC) Continue working with nutrition therapist.  Encouraged by weight gain.  Discussed fainting episode - likely due to POTS at the same time as menses.  Advised to discuss further with neuro and cards.  Mother to call to discuss. Wt Readings from Last 3 Encounters:  05/14/15 120 lb 5.9 oz (54.6 kg) (34 %*, Z = -0.40)  05/07/15 119 lb 3.2 oz (54.069 kg) (32 %*, Z = -0.46)  05/05/15 118 lb 6.4 oz (53.706 kg) (31 %*, Z = -0.51)   * Growth percentiles are based on CDC 2-20 Years data.   2. Anorexia nervosa Anxiety has decreased.  Continue current low dose of zyprexa.  Consider increasing in future if worsening anxiety or if no further improvement. - OLANZapine (ZYPREXA) 2.5 MG tablet; Take 0.5 tablets (1.25 mg total) by mouth at bedtime.  Dispense: 30 tablet; Refill: 0  3. Episodic tension-type headache, not intractable Discussed symptoms likely tension related.  Advised also to discuss low BP with Dr. Mayer Camelatum, ? If any change in beta blocker is indicated.  Advised to try relaxation exercises.  Discussed trial of triptan over the weekend if she is still experiencing the HA unless otherwise specified by Dr. Mayer Camelatum.  4. Screening for genitourinary condition - POCT urinalysis dipstick  Results for orders placed or performed in visit on 05/14/15  POCT urinalysis dipstick  Result  Value Ref Range   Color, UA yellow    Clarity, UA cloudy    Glucose, UA normal    Bilirubin, UA neg    Ketones, UA neg    Spec Grav, UA <=1.005    Blood, UA neg    pH, UA >=9.0    Protein, UA trace    Urobilinogen, UA negative    Nitrite, UA neg    Leukocytes, UA Negative Negative    Follow-up:  Return for next appt as scheduled.   Medical decision-making:  > 25 minutes spent, more than 50% of appointment was spent discussing diagnosis and management of symptoms

## 2015-05-14 NOTE — Patient Instructions (Signed)
Call Dr. Darl HouseholderHickling's office if you do not hear back from him and also contact Dr. Mayer Camelatum about your low blood pressure.  Dr. Mayer Camelatum can access your records in our system to look at your blood pressure.  Consider taking a triptan over the weekend if you are still having a bad headache.  Relaxation & Meditation Apps for Teens Mindshift StopBreatheThink Relax & Rest Smiling Mind Calm Headspace Take A Chill

## 2015-05-14 NOTE — Progress Notes (Deleted)
THIS RECORD MAY CONTAIN CONFIDENTIAL INFORMATION THAT SHOULD NOT BE RELEASED WITHOUT REVIEW OF THE SERVICE PROVIDER.  Adolescent Medicine Consultation Follow-Up Visit Jessica [name]  is a 19 y.o. female referred by [name], [name], MD here today for follow-up.    Growth Chart Viewed? yes   History was provided by the {CHL AMB PERSONS; PED RELATIVES/OTHER W/PATIENT:[phone]}.  PCP Confirmed?  {YES ZO:10960}O:22349}  My Chart Activated?   {YES [id number]:22349}   Previsit planning completed:  Yes Patient ID: Jessica [name], female   DOB: 04-03-1996, 19 y.o.   MRN: [id number] Pre-Visit Planning  Jessica [name]  is a 19 y.o. female referred by Norman ClayLOWE,[name] V, MD.   Last seen [facility] on 05/07/15 for DE.   Previous Psych Screenings?  no  Treatment plan at last visit included continue medications/no change in POC. Nutritional status stable.  She was seen on 05/13/15 by Denny LevyLaura Reavis, RD. Note reviewed. Agree with POC.   Clinical Staff Visit Tasks:   - Urine GC/CT due? No, negative screen on 04/13/15.  - Psych Screenings Due? no - EVS    Provider Visit Tasks: - Evaluate if OK to share last labs with mother - Address confidentiality & consent again with mother and daughter.  - Evaluate nutritional status, DE symptoms  - Pertinent Labs? no   HPI:    ***  Patient's last menstrual period was 05/04/2015. Allergies  Allergen Reactions  . Amoxicillin Rash   Current Outpatient Prescriptions on File Prior to Visit  Medication Sig Dispense Refill  . atenolol (TENORMIN) 25 MG tablet 1 tablet in the morning    . Calcium-Vitamin D-Vitamin K (VIACTIV PO) Take by mouth.    . fludrocortisone (FLORINEF) 0.1 MG tablet Take 1 tablet by mouth daily 90 tablet 5  . FLUoxetine (PROZAC) 20 MG tablet Take 1.5 tablets (30 mg total) by mouth daily. 45 tablet 1  . ibuprofen (ADVIL,MOTRIN) 200 MG tablet Take 200 mg by mouth every 6 (six) hours as needed.    . Multiple Vitamins-Minerals  (MULTIVITAMIN ADULT PO) Take by mouth.    . norethindrone-ethinyl estradiol-iron (JUNEL FE 1.5/30) 1.5-30 MG-MCG tablet Take 1 tablet by mouth daily. 1 Package 11  . OLANZapine (ZYPREXA) 2.5 MG tablet Take 1 tablet (2.5 mg total) by mouth at bedtime. (Patient taking differently: Take 1.25 mg by mouth at bedtime. ) 30 tablet 0  . polyethylene glycol (MIRALAX / GLYCOLAX) packet Take 17 g by mouth daily.    . QUDEXY XR 100 MG CS24 One tablet at nighttime 30 each 5  . SUMAtriptan (IMITREX) 25 MG tablet Take 25 mg by mouth every 2 (two) hours as needed for migraine. May repeat in 2 hours if headache persists or recurs.     No current facility-administered medications on file prior to visit.    Social History: School:  {Misc; school status:18689} Nutrition/Eating Behaviors:  *** Exercise:  *** Sleep:  {SX; SLEEP PATTERNS:18802}  Confidentiality was discussed with the patient and if applicable, with caregiver as well.  Patient's personal or confidential phone number: *** Tobacco?  {YES/NO/WILD JYNWG:95621}CARDS:18581} Drugs/ETOH?  {YES/NO/WILD HYQMV:78469}CARDS:18581} Partner preference?  {CHL AMB PARTNER PREFERENCE:[phone]} Sexually Active?  {YES [id number]:22349}   Pregnancy Prevention:  {Pregnancy Prevention:[phone]}, reviewed condoms & plan B Safe at home, in school & in relationships?  {Yes or If no, why not?:20788} Safe to self?  {Yes or If no, why not?:20788}  Guns in the home?  {YES/NO/WILD GEXBM:84132}CARDS:18581}  {Common ambulatory SmartLinks:19316}  Physical Exam:  Filed Vitals:  05/14/15 1359  Height: 5' 5.5" (1.664 m)  Weight: 120 lb 5.9 oz (54.6 kg)   Ht 5' 5.5" (1.664 m)  Wt 120 lb 5.9 oz (54.6 kg)  BMI 19.72 kg/m2  LMP 05/04/2015 Body mass index: body mass index is 19.72 kg/(m^2). No blood pressure reading on file for this encounter.  Physical Exam   Assessment/Plan: ***  Follow-up:  No Follow-up on file.   Medical decision-making:  > *** minutes spent, more than 50% of appointment was spent  discussing diagnosis and management of symptoms

## 2015-05-17 MED ORDER — TOPIRAMATE ER 150 MG PO SPRINKLE CAP24: EXTENDED_RELEASE_CAPSULE | ORAL | Status: DC

## 2015-05-17 NOTE — Telephone Encounter (Signed)
I spoke with Jessica [name].  Sumatriptan helped her headaches which suggests that this may be migrainous in nature.  I will increase daily seated 150 mg.  She has 20 of the 100 mg tablets left.  I suggested that she uses sumatriptan sparingly.  Her blood pressure was relatively low at her most recent visit with Dr. Marina GoodellPerry.  Requiring to drop atenolol 12.5 mg area and I'm reluctant to discontinue it altogether because am afraid that she will develop tachycardia and more episodes of syncope.  She apparently had an episode of syncope recently.  This happened in the setting where she was perhaps not drinking as much and she should and her menstrual period began.  One medicine that we may consider using if this continues his tizanidine.  I asked her to call me back next Monday when I'm back in the office.  I will also take a co-pay card out to the front office.

## 2015-05-20 ENCOUNTER — Encounter: Payer: BLUE CROSS/BLUE SHIELD | Admitting: *Deleted

## 2015-05-20 DIAGNOSIS — F509 Eating disorder, unspecified: Secondary | ICD-10-CM | POA: Diagnosis not present

## 2015-05-20 DIAGNOSIS — E441 Mild protein-calorie malnutrition: Secondary | ICD-10-CM

## 2015-05-20 DIAGNOSIS — F5000 Anorexia nervosa, unspecified: Secondary | ICD-10-CM

## 2015-05-20 NOTE — Progress Notes (Signed)
Appointment start time: 1400  Appointment end time: 1500  Patient was seen on 05/20/15 for nutrition counseling pertaining to disordered eating.  She is accompanied by her mother  Primary care provider: Dr. Rana SnareLowe Therapist: Noni SaupeHeather Kitchen, weekly    Any other medical team members: Dr. Marina GoodellPerry Parents: Jessica LevanJoy [name]  Assessment:   Has been hypotensive recently.  Family is getting conflicting reports from specialists and has decided not to make change Added to her breakfast: apple slices with Quest; CIB and fruit.  Toady had cereal with banana Thought about having pizza, but was just busy and is willing to try this week  Complains of daily stomach upset and pain.  This is chronic and was an issue before anorexia.  States it's worse now and does not take any medicine.  Has stomach pain and feels she's going to throw up Constipation is improving.  Needs miralax, but is able (8 oz juice with that) to move her bowels   Growth Metrics: Ideal BMI for age: 3321.6 BMI 05/11/15: 20 % Ideal:  92.7% Previous growth data: weight/age  33-90th%; height/age at 75%; BMI/age 44-85% Goal BMI range based on growth chart data: 22-25 Goal weight range based on growth chart data: 130-145 lb Goal rate of weight gain:  0.5 lb/week   Mental health diagnosis: AN, B/P subtype   Dietary assessment: mom says she's always been picky; currently following gluten-free diet  Safe foods include: salad with minimal dressing, fruits, oatmeal, rice chex Avoided foods include:desserts, meat, pasta, fast food, chips, pizza, bread, all other potatoes besides baked  24 hour recall B: quest bar L: pimento cheese and 5 bagel chips; 2 large crackers and peanut butter cookie dough S: Apple with pumpkin peanut butter D: salad with balsamic dressing and cheese, fruit cocktail, whole deviled egg, spoonful potato salad S: pumpkin fluff S: greek yogurt and coffee with flavored syrup  Tuesday B: CIB and banana S: Luna bar L: apple  and peanut butter S: coffee D: apple pecan chicken salad (1/2)  S: greek yogurt  Monday B: luna bar and apple/grapes L: spinach salad with soy chicken, fruit, and dressing S: apple with peanut butter D: apple fuji chicken salad and tomato soup S: coffee with flavor.  Gluten free monster cookie    Estimated energy intake: 1500-1700 kcal  Estimated energy needs: 2200 kcal for weight restoration    275 g CHO 110 g pro 73 g fat  Nutrition Diagnosis: NI-1.4 Inadequate energy intake As related to restricting and purging.  As evidenced by weight loss of 16 pounds .  Intervention/Goals: Nutrition counseling provided.  Praised Architectural technologistCiara for her hard work.  Instructed Jessica [name] to talk with dr Marina Goodellperry about various medical concerns  Add 1 Gatorade/day for blood pressure Try homemade pizza Decrease volume of salad and add another component: starch  preferrably   Monitoring and Evaluation: Patient will follow up in 1 week

## 2015-05-21 ENCOUNTER — Telehealth: Payer: Self-pay

## 2015-05-21 NOTE — Telephone Encounter (Signed)
RN called and spoke with mother, instructed mother to watch Haeley's intake and to report any fever or worsening symptoms. If symptoms progress or emesis returns, she can be seen at Urgent Care over the weekend if PCP is not available. For rehydration, she can supplement with water or Gatorade and see if she can tolerate this, about 3-4 oz every hour for maintenance fluids. She can break it into smaller amounts as tolerated and make sure to drink slowly. Mother stated understanding and that [name] is awake now and doing better. She states the emesis could be from having introduced new foods into Princessa's diet (yogurt) this morning as well. Mother stated understanding of instructions with no further questions or concerns at this time.

## 2015-05-21 NOTE — Telephone Encounter (Signed)
Joy (Mother) called concerned due to Ernesha reporting not feeling well this morning stating her stomach had been hurting. Mother had encouraged Audry to eat this morning and Sally-Ann had one episode of emesis after eating, followed by two episodes of diarrhea throughout the day. Kathrene has not vomited since this morning and is sleeping now. Mother feels [name] has a stomach virus but wants to make sure she does not need to keep April from sleeping or following a certain protocol. Mother can be reached at [phone].

## 2015-05-21 NOTE — Telephone Encounter (Signed)
Yes mom watch her intake tonight. Report fever or worsening symptoms. If symptoms progress or emesis returns, she can be seen at Urgent Care over weekend if PCP not available. For rehydration, she can supplement with water or Gatorade 2 and see if she can tolerate this, about 3-4 oz every hour for maintenance fluids.  Break it up into smaller amounts as tolerated. Drink slowly.

## 2015-05-23 ENCOUNTER — Encounter: Payer: Self-pay | Admitting: Pediatrics

## 2015-05-23 NOTE — Progress Notes (Signed)
Pre-Visit Planning  [name]  is a 19 y.o. female referred by Jessica ClayLOWE,[name] V, MD.   Last seen in [facility] on 05/14/2015 for anorexia, malnutrition and headaches.   Previous Psych Screenings?  yes, PHQSADs 04/29/2015, EAT26 04/23/2015  Treatment plan at last visit included continue meal plan towards weight gain, discussed fainting episode and advised f/u with Jessica [name], discussed HA and advised f/u with Jessica [name], cont psych meds as prescribed.   Clinical Staff Visit Tasks:   - Urine GC/CT due? no - Psych Screenings Due? yes, PHQSADs - DE intake with extended vitals  Provider Visit Tasks: - Assess eating patterns - Assess anxiety - Assess medication side effects and benefits - Pertinent Labs? No  Growth Metrics: Ideal BMI for age: 321.6 Previous growth data: weight/age 27-90th%; height/age at 75%; BMI/age 45-85% Goal BMI range based on growth chart data: 22-25 Goal weight range based on growth chart data: 130-145 lb Goal rate of weight gain: 0.5 lb/week  Monitoring Guidelines for Zyprexa - Hgba1c at baseline, 3 months after initiation, then annually if normal, every 3 months if abnormal:  Due 07/2015 - Lipids at baseline, 3 months after initiation, then every 2 years if normal, annually if abnormal:  Due 07/2015 - CMP annually if normal, as needed if abnormal:  Due 04/2016  - CBC annually if normal, as needed if abnormal:  Due 04/2016  - Prolactin if change in menstruation, libido, development of galactorrhea, erectile and ejaculatory function

## 2015-05-24 ENCOUNTER — Ambulatory Visit (INDEPENDENT_AMBULATORY_CARE_PROVIDER_SITE_OTHER): Payer: BLUE CROSS/BLUE SHIELD | Admitting: Pediatrics

## 2015-05-24 ENCOUNTER — Encounter: Payer: Self-pay | Admitting: Pediatrics

## 2015-05-24 VITALS — BP 118/77 | HR 81 | Ht 64.57 in | Wt 123.4 lb

## 2015-05-24 DIAGNOSIS — I951 Orthostatic hypotension: Secondary | ICD-10-CM

## 2015-05-24 DIAGNOSIS — R000 Tachycardia, unspecified: Secondary | ICD-10-CM | POA: Diagnosis not present

## 2015-05-24 DIAGNOSIS — G43009 Migraine without aura, not intractable, without status migrainosus: Secondary | ICD-10-CM | POA: Diagnosis not present

## 2015-05-24 DIAGNOSIS — G90A Postural orthostatic tachycardia syndrome (POTS): Secondary | ICD-10-CM

## 2015-05-24 DIAGNOSIS — G8929 Other chronic pain: Secondary | ICD-10-CM | POA: Diagnosis not present

## 2015-05-24 DIAGNOSIS — R1031 Right lower quadrant pain: Secondary | ICD-10-CM | POA: Diagnosis not present

## 2015-05-24 DIAGNOSIS — F5000 Anorexia nervosa, unspecified: Secondary | ICD-10-CM

## 2015-05-24 DIAGNOSIS — R198 Other specified symptoms and signs involving the digestive system and abdomen: Secondary | ICD-10-CM | POA: Insufficient documentation

## 2015-05-24 DIAGNOSIS — Z1389 Encounter for screening for other disorder: Secondary | ICD-10-CM

## 2015-05-24 DIAGNOSIS — E441 Mild protein-calorie malnutrition: Secondary | ICD-10-CM

## 2015-05-24 DIAGNOSIS — R1032 Left lower quadrant pain: Secondary | ICD-10-CM | POA: Diagnosis not present

## 2015-05-24 LAB — POCT URINALYSIS DIPSTICK
Bilirubin, UA: NEGATIVE
Glucose, UA: NEGATIVE
KETONES UA: NEGATIVE
NITRITE UA: NEGATIVE
PH UA: 8
PROTEIN UA: NEGATIVE
Spec Grav, UA: <=1.005
Urobilinogen, UA: NEGATIVE

## 2015-05-24 NOTE — Patient Instructions (Addendum)
Check your blood pressure twice daily and send the results to me in my chart after 1 week of collection.    Start taking a probiotic daily.  Try Culturelle.  Diet for Irritable Bowel Syndrome When you have irritable bowel syndrome (IBS), the foods you eat and your eating habits are very important. IBS may cause various symptoms, such as abdominal pain, constipation, or diarrhea. Choosing the right foods can help ease discomfort caused by these symptoms. Work with your health care provider and dietitian to find the best eating plan to help control your symptoms. WHAT GENERAL GUIDELINES DO I NEED TO FOLLOW?  Keep a food diary. This will help you identify foods that cause symptoms. Write down:  What you eat and when.  What symptoms you have.  When symptoms occur in relation to your meals.  Avoid foods that cause symptoms. Talk with your dietitian about other ways to get the same nutrients that are in these foods.  Eat more foods that contain fiber. Take a fiber supplement if directed by your dietitian.  Eat your meals slowly, in a relaxed setting.  Aim to eat 5-6 small meals per day. Do not skip meals.  Drink enough fluids to keep your urine clear or pale yellow.  Ask your health care provider if you should take an over-the-counter probiotic during flare-ups to help restore healthy gut bacteria.  If you have cramping or diarrhea, try making your meals low in fat and high in carbohydrates. Examples of carbohydrates are pasta, rice, whole grain breads and cereals, fruits, and vegetables.  If dairy products cause your symptoms to flare up, try eating less of them. You might be able to handle yogurt better than other dairy products because it contains bacteria that help with digestion. WHAT FOODS ARE NOT RECOMMENDED? The following are some foods and drinks that may worsen your symptoms:  Fatty foods, such as Jamaica[place] fries.  Milk products, such as cheese or ice  cream.  Chocolate.  Alcohol.  Products with caffeine, such as coffee.  Carbonated drinks, such as soda. The items listed above may not be a complete list of foods and beverages to avoid. Contact your dietitian for more information. WHAT FOODS ARE GOOD SOURCES OF FIBER? Your health care provider or dietitian may recommend that you eat more foods that contain fiber. Fiber can help reduce constipation and other IBS symptoms. Add foods with fiber to your diet a little at a time so that your body can get used to them. Too much fiber at once might cause gas and swelling of your abdomen. The following are some foods that are good sources of fiber:  Apples.  Peaches.  Pears.  Berries.  Figs.  Broccoli (raw).  Cabbage.  Carrots.  Raw peas.  Kidney beans.  Lima beans.  Whole grain bread.  Whole grain cereal. FOR MORE INFORMATION  International Foundation for Functional Gastrointestinal Disorders: www.iffgd.[employer] National Institute of Diabetes and Digestive and Kidney Diseases: http://norris-lawson.com/www.niddk.nih.gov/health-information/health-topics/digestive-diseases/ibs/Pages/facts.aspx   This information is not intended to replace advice given to you by your health care provider. Make sure you discuss any questions you have with your health care provider.   Document Released: 09/30/2003 Document Revised: 07/31/2014 Document Reviewed: 10/10/2013 Elsevier Interactive Patient Education [employer]2016 Elsevier Inc.

## 2015-05-24 NOTE — Progress Notes (Signed)
THIS RECORD MAY CONTAIN CONFIDENTIAL INFORMATION THAT SHOULD NOT BE RELEASED WITHOUT REVIEW OF THE SERVICE PROVIDER.  Adolescent Medicine Consultation Follow-Up Visit Jessica [name]  is a 19 y.o. female referred by [name], [name], MD here today for follow-up.    Growth Chart Viewed? yes   History was provided by the patient and mother.  PCP Confirmed?  yes  My Chart Activated?   yes   Previsit planning completed:  yes Pre-Visit Planning  Jessica [name]  is a 19 y.o. female referred by Norman ClayLOWE,[name] V, MD.   Last seen in [facility] on 05/14/2015 for anorexia, malnutrition and headaches.   Previous Psych Screenings?  yes, PHQSADs 04/29/2015, EAT26 04/23/2015  Treatment plan at last visit included continue meal plan towards weight gain, discussed fainting episode and advised f/u with Dr. Mayer Camelatum, discussed HA and advised f/u with Dr. Sharene SkeansHickling, cont psych meds as prescribed.   Clinical Staff Visit Tasks:   - Urine GC/CT due? no - Psych Screenings Due? yes, PHQSADs - DE intake with extended vitals  Provider Visit Tasks: - Assess eating patterns - Assess anxiety - Assess medication side effects and benefits - Pertinent Labs? No  Growth Metrics: Ideal BMI for age: 7221.6 Previous growth data: weight/age 86-90th%; height/age at 75%; BMI/age 48-85% Goal BMI range based on growth chart data: 22-25 Goal weight range based on growth chart data: 130-145 lb Goal rate of weight gain: 0.5 lb/week  Wt Readings from Last 3 Encounters:  05/24/15 123 lb 6.4 oz (55.974 kg) (40 %*, Z = -0.24)  05/14/15 120 lb 5.9 oz (54.6 kg) (34 %*, Z = -0.40)  05/07/15 119 lb 3.2 oz (54.069 kg) (32 %*, Z = -0.46)   * Growth percentiles are based on CDC 2-20 Years data.    Monitoring Guidelines for Zyprexa - Hgba1c at baseline, 3 months after initiation, then annually if normal, every 3 months if abnormal:  Due 07/2015 - Lipids at baseline, 3 months after initiation, then every 2 years if  normal, annually if abnormal:  Due 07/2015 - CMP annually if normal, as needed if abnormal:  Due 04/2016  - CBC annually if normal, as needed if abnormal:  Due 04/2016  - Prolactin if change in menstruation, libido, development of galactorrhea, erectile and ejaculatory function    HPI:    She reports she reviewed HA with Dr. Sharene SkeansHickling.  Increased Qdexy to 150 mg.  Now HA has decreased.   Spoke with Dr. Noel Christmasatum's nurse, reviewed note from care everywhere that is posted below.  Having some lightheadedness or dizziness.  Did not make the changes recommended by Dr. Mayer Camelatum as Mom was not sure and/or concerned about making abrupt changes. Note in Care everywhere: "Mother called and stated that Jessica [name] passed out last Friday and was found to be hypoglycemic. She is in active care for treatment of anorexia. She was seen by Dr. Marina GoodellPerry today (Adolescent Medicine) and was found to have a BP of 88/54. She reportedly told mother that perhaps the beta-blocker should be adjusted.  I spoke with Dr. Mayer Camelatum. He stated that the mother should check the BP daily for the next few days. He recommended that [name] drop her atenolol dose to 1/2 tab daily for 2 days then discontinue. However, he wants mother to stop the atenolol immediately if the BP is less than 90 systolic. I spoke with mother and gave her those instructions. She does have a home BP and repeated my instructions back to me. She will call with  further concerns."  BP monitored over the weekend, 90/54 and 92/56.  Patient and mother are going to continue to monitor for the next week.  Stomach hurts a lot.  This has been an issue for years.  Started before her eating disorder.  Not previously evaluated but discussed with PCP that ED treatment is indicated first.  Gets sharp pains in her stomach, sometimes would pass out from that pain.  Went gluten free for POTS and that helped some but still has a lot of stomach issues, gets nauseous really easily.  Did not get tested for  celiac.  Usually her lower abdomen, occurs several times daily.  Has taken probiotic previously but currently not taking a probiotic.  Patient's last menstrual period was 05/04/2015. Allergies  Allergen Reactions  . Amoxicillin Rash   Current Outpatient Prescriptions on File Prior to Visit  Medication Sig Dispense Refill  . atenolol (TENORMIN) 25 MG tablet 1 tablet in the morning    . Calcium-Vitamin D-Vitamin K (VIACTIV PO) Take by mouth.    . fludrocortisone (FLORINEF) 0.1 MG tablet Take 1 tablet by mouth daily 90 tablet 5  . FLUoxetine (PROZAC) 20 MG tablet Take 1.5 tablets (30 mg total) by mouth daily. 45 tablet 1  . ibuprofen (ADVIL,MOTRIN) 200 MG tablet Take 200 mg by mouth every 6 (six) hours as needed.    . Multiple Vitamins-Minerals (MULTIVITAMIN ADULT PO) Take by mouth.    . norethindrone-ethinyl estradiol-iron (JUNEL FE 1.5/30) 1.5-30 MG-MCG tablet Take 1 tablet by mouth daily. 1 Package 11  . OLANZapine (ZYPREXA) 2.5 MG tablet Take 0.5 tablets (1.25 mg total) by mouth at bedtime. 30 tablet 0  . polyethylene glycol (MIRALAX / GLYCOLAX) packet Take 17 g by mouth daily.    . SUMAtriptan (IMITREX) 25 MG tablet Take 25 mg by mouth every 2 (two) hours as needed for migraine. May repeat in 2 hours if headache persists or recurs.     No current facility-administered medications on file prior to visit.    Social History: Nutrition/Eating Behaviors:  Overall following Laura's recommendations, added Gatorade Exercise:  No physical activity other than walking to class Sleep:  Waking with night sweats, discussed this is due to prozac  Confidentiality was discussed with the patient and if applicable, with caregiver as well.  The following portions of the patient's history were reviewed and updated as appropriate: allergies, current medications, past social history and problem list.  Physical Exam:  Filed Vitals:   05/24/15 1318 05/24/15 1326 05/24/15 1328  BP:  104/63 118/77  Pulse:   74 81  Height: 5' 4.57" (1.64 m)    Weight: 123 lb 6.4 oz (55.974 kg)     BP 118/77 mmHg  Pulse 81  Ht 5' 4.57" (1.64 m)  Wt 123 lb 6.4 oz (55.974 kg)  BMI 20.81 kg/m2  LMP 05/04/2015 Body mass index: body mass index is 20.81 kg/(m^2). Blood pressure percentiles are 82% systolic and 91% diastolic based on 2000 NHANES data. Blood pressure percentile targets: 90: 122/77, 95: 126/81, 99 + 5 mmHg: 138/93.  Physical Exam  Constitutional: No distress.  Neck: No thyromegaly present.  Cardiovascular: Normal rate and regular rhythm.   No murmur heard. Pulmonary/Chest: Breath sounds normal.  Abdominal: Soft. She exhibits no distension and no mass. There is tenderness (mild lower abdominal tenderness). There is no guarding.  Musculoskeletal: She exhibits no edema.  Lymphadenopathy:    She has no cervical adenopathy.  Neurological: She is alert.  Nursing note and vitals reviewed.  PHQ-SADS 05/24/2015 04/29/2015 04/13/2015  PHQ-15 11 12 14   GAD-7 4 7 9   PHQ-9 5 9 8   Comment Somewhat difficult Somewhat difficult Somewhat difficult    Assessment/Plan: 1. Malnutrition of mild degree [name]: 75% to less than 90% of standard weight) (HCC) 2. Anorexia nervosa - Continue working with Vernona Rieger and [employer].  Pt's weight and vitals are improving. - Continue Zyprexa and prozac.  Reviewed nice improvement in PHQSADs scoring since starting the zyprexa. - Consider d/c of OCP in near future to monitor for spontaneous resumption of menses - F/u q 2 weeks for the next 3 visits and if continued improvement can increase time between visits.  3. Migraine without aura and without status migrainosus, not intractable Per Dr. Darl Householder instructions, continue increased topiramate ER dose.  Keep HA diary and send to him.  Appears to have benefit from dose increase. - Topiramate ER 150 MG CS24; ; Refill: 5  4. Postural orthostatic tachycardia syndrome Having some symptoms of dizziness and fatigue.  Unclear if  this is due to lower blood pressure associated with atenolol or associated with POTS.  Advised to monitor BP twice daily at home for 1 week and send numbers via my chart.  Would consider dose decrease in atenolol as recommended by Dr. Mayer Camel given her pressure is running generally very low.  5. Chronic bilateral lower abdominal pain Advised to add probiotic back.  Discussed dietary modifications associated with IBS.  Advised would consider further evaluation when ED is more stabilized and after making some changes to limit triggers of the pain.  Also discussed coping strategies for pain could be considered at some point in the future as well as mother and other family members all report chronic abdominal pain that is similar.  6. Screening for genitourinary condition - POCT urinalysis dipstick   Follow-up:  Return in about 2 weeks (around 06/07/2015) for DE f/u with extended vitals, with Dr. Marina Goodell, with Neysa Bonito.   Medical decision-making:  > 25 minutes spent, more than 50% of appointment was spent discussing diagnosis and management of symptoms

## 2015-05-26 ENCOUNTER — Ambulatory Visit: Payer: BLUE CROSS/BLUE SHIELD | Admitting: *Deleted

## 2015-05-26 ENCOUNTER — Encounter: Payer: BLUE CROSS/BLUE SHIELD | Attending: Pediatrics | Admitting: *Deleted

## 2015-05-26 ENCOUNTER — Encounter: Payer: Self-pay | Admitting: *Deleted

## 2015-05-26 DIAGNOSIS — Z713 Dietary counseling and surveillance: Secondary | ICD-10-CM | POA: Diagnosis not present

## 2015-05-26 DIAGNOSIS — F509 Eating disorder, unspecified: Secondary | ICD-10-CM | POA: Insufficient documentation

## 2015-05-26 NOTE — Progress Notes (Signed)
Appointment start time: 1400  Appointment end time: 1500  Patient was seen on 05/26/15 for nutrition counseling pertaining to disordered eating.  She is accompanied by her mother  Primary care provider: Dr. Rana Snare Therapist: Noni Saupe, weekly    Any other medical team members: Dr. Marina Goodell Parents: [name]  Assessment:  Jessica [name] states stomach still upset.  Started probiotic (philips colon health); wants to cut back on miralax?  Concerns about IBS dietary recommendations.  Doesn't get adequate fluids.  Hasn't had the recommended Gatorade each day.  Hasn't been adding fruit to breakfast consistently.  Weight restoration is improving however.  Complains of night sweats.  States it's intolerable and wants to stop medications  Has been eating halloween candy. Made her stomach hurt.  Had pizza today for lunch and it was really good.  She made it herself.  Felt fine about it (psychologically).  Stomach hurt afterwards    Growth Metrics: Ideal BMI for age: 68.6 BMI 05/24/15: 20.8 % Ideal:  92.7% Previous growth data: weight/age  31-90th%; height/age at 75%; BMI/age 9-85% Goal BMI range based on growth chart data: 22-25 Goal weight range based on growth chart data: 130-145 lb Goal rate of weight gain:  0.5 lb/week   Mental health diagnosis: AN, B/P subtype   Dietary assessment: mom says she's always been picky; currently following gluten-free diet  Safe foods include: salad with minimal dressing, fruits, oatmeal, rice chex Avoided foods include:desserts, meat, pasta, fast food, chips, pizza, bread, all other potatoes besides baked   24 hour recall B: luna bar L: tomato soup with cottage cheese, 2 gluten free peantu butter cookies (felt giuty so skipped snack) S: skipped D:  1/2 apple salad and tomato soup S: banana and pumpkin peanut butter Peppermint mocha  Monday B: CIB S: coffee with cream and pumpkin L: salad with soy protien, apples, cheese, craisins, 1 gluten free cookie,  m&ms S: apple with peantu butter  D: chicken with tomatoe and mozarella, salad S: yogurt and some gatorade  Sunday B: spaecial k shake with fruit packet L: salad with chicken A: apple with peantu butter S: heath bar D: chciekn salad with 2 larger crackers S: yogurt and gatorade   Friday B: kind bar and gatorade S: coffee with all kinds of stuff added L: taco chili... Threw up S: small baked potato and applesauce   Estimated energy intake: 1500 kcal  Estimated energy needs: 2200 kcal for weight restoration    275 g CHO 110 g pro 73 g fat  Nutrition Diagnosis: NI-1.4 Inadequate energy intake As related to restricting and purging.  As evidenced by weight loss of 16 pounds .  Intervention/Goals: Nutrition counseling provided.  Reiterated need for adequate fluids.  Discussed GI symptoms.  IBS recommendations (no caffeine, fatty foods) don't really apply as [place] already avoids those foods.  She already eats 5 small meals/day.  She doesn't consume much dairy.  She does get a fair amount of fiber from her salads, but she's been instructed to cut back on salads already.  Discussed need for GI referral most likely in the future (will confer with medical provider for this referral).  Do continue probiotic and track symptoms to see if there is a pattern.  Counseled not necessary to cut back miralax with starting probiotic Discussed hypermetabolic state and assured this is temporary, but it is important to honor her hunger cues.  Feeling physical hunger is a sign her body needs more food.  It takes a lot of energy  to rebuild her body and it's ok to eat.  Not eating will most likely lead to binge like behavior later on.  Discussed she's not eating enough at her meals, which is most likely why she's hungry frequently.  strategized how to stay cooler at night so her night sweats aren't so bad  Goals: Add 1 Gatorade/day for blood pressure Try gluten-free muffin with breakfast shake Ensure  adequate hydration: 6-8 cups caffeine free beverages/day Decrease volume of salad and add another component: starch  preferably Track GI symptoms   Monitoring and Evaluation: Patient will follow up in 1 week

## 2015-05-27 ENCOUNTER — Ambulatory Visit: Payer: BLUE CROSS/BLUE SHIELD | Admitting: *Deleted

## 2015-06-03 ENCOUNTER — Encounter: Payer: BLUE CROSS/BLUE SHIELD | Admitting: *Deleted

## 2015-06-03 DIAGNOSIS — F509 Eating disorder, unspecified: Secondary | ICD-10-CM | POA: Diagnosis not present

## 2015-06-03 DIAGNOSIS — F5000 Anorexia nervosa, unspecified: Secondary | ICD-10-CM

## 2015-06-03 NOTE — Progress Notes (Signed)
Appointment start time: 1400  Appointment end time: 1445  Patient was seen on 06/03/15 for nutrition counseling pertaining to disordered eating.  She is accompanied by her mother  Primary care provider: Dr. Rana Snare Therapist: Noni Saupe, weekly    Any other medical team members: Dr. Marina Goodell Parents: [name]  Assessment:  Stopped taking medication for a couple days, didn't sleep any better and then she started back.   Her sleep is no worse, but no better.  States she still has trouble sleeping with night sweats   GI symptoms no better. No pattern, no particular foods affect her more than others.  Foods do make things worse. Asking for GI referral BP still low consistently.  Has been drinking electrolyte beverage daily, but not adequate total fluids Went to trampoline park this past weekend.  That was fun, but tiring.  Would like to start yoga or walking for stress/anxiety management Has been eating a little more.  Added a second food to her breakfast ~ 50% of the time.  States some days are easier than others.  On the days that she struggled more with body image or ED thoughts, she does not eat all her snacks, or eats small meals.  Has not been able to work through those struggles.  Started following [employer] on social media, but doesn't look at it often.  Is concerned about her weight gain in her stomach area    Growth Metrics: Ideal BMI for age: 73.6 BMI 05/24/15: 20.8 % Ideal:  92.7% Previous growth data: weight/age  46-90th%; height/age at 75%; BMI/age 48-85% Goal BMI range based on growth chart data: 22-25 Goal weight range based on growth chart data: 130-145 lb Goal rate of weight gain:  0.5 lb/week   Mental health diagnosis: AN, B/P subtype   Dietary assessment: mom says she's always been picky; currently following gluten-free diet  Safe foods include: salad with minimal dressing, fruits, oatmeal, rice chex Avoided foods include:desserts, meat, pasta, fast  food, chips, pizza, bread, all other potatoes besides baked   Thursday B: special k breakfast drink adn bluevberry muffin Rice chex with banana and soy milk Gatorade 4 nuggets, 2 strawberries, 2 bites cantaloupe, 1 bit ice cream Banana and peanut butter Beverages: water  Friday B: lara bar L: tomato soup with cottage cheese Yogurt with granola Gatorade Chicken nachos Peppermint mocha danimals  Saturday B; oatmeal with banana Chicken salad with bagel chips Kind bar Chick fil a kids meal with sprite Propel water Peppermint mocha yogurt  Sunday luna bar Baby quesadilla with chips and salsa caramel machiato Apple with peanut butter panera apple salad and tomaoto soup Proper water Yogurt with granola  Tuesday Cib and banana Oatmeal with apple and craisins Cherry fluff and cranberry sprite Veggie soup with chicken Coffee drink Yogurt with a lot of granola    Estimated energy intake: 1500 kcal  Estimated energy needs: 2200 kcal for weight restoration    275 g CHO 110 g pro 73 g fat  Nutrition Diagnosis: NI-1.4 Inadequate energy intake As related to restricting and purging.  As evidenced by weight loss of 16 pounds .  Intervention/Goals: Nutrition counseling provided.  This provider will work with medical team to try and find appropriate GI specialist.  In interim, please track symptoms.  Gave resources for body positivity.  Encouraged increased positive messaging to help combat negative internal messages.  Discussed how initial weight restoration is typically confined to abdomen, but it disperses as continued weight restoration occurs.  Broached subject of getting new pants.  If she's physically uncomfortable, she will be psychologically uncomfortable and trying to fit into too small pants will reinforce negative body image and triggering thoughts.   This provider is concerned about her low blood pressure and desire for exercise.  Mom assures that she will go with  Kyiesha.  Will monitor weight...   Goals: 8 oz Gatorade with each meal Propel throughout the day 2 things with breakfast Ok for 15 minute walk daily- with an escort  Or ok for yoga 2/week at home Track GI symptoms   Monitoring and Evaluation: Patient will follow up in 1 week

## 2015-06-03 NOTE — Patient Instructions (Signed)
Ed Bites [employer] Heal The Body is Not An Apology Beauty Redefined Beating Eating Disorders [employer] [employer] The Body Positive   Goals: 8 oz Gatorade with each meal Propel throughout the day 2 things with breakfast Ok for 15 minute walk daily Ok for yoga 2/week

## 2015-06-04 ENCOUNTER — Encounter: Payer: Self-pay | Admitting: *Deleted

## 2015-06-07 ENCOUNTER — Encounter: Payer: Self-pay | Admitting: Family

## 2015-06-07 NOTE — Progress Notes (Signed)
Patient ID: Jessica [name], female   DOB: [birth year], 19 y.o.   MRN: [id number] Pre-Visit Planning  Jessica [name]  is a 19 y.o. female referred by Norman ClayLOWE,[name] V, MD.   Last seen in [facility] on 05/24/15 for AN B/P subtype  Previous Psych Screenings?  Yes   EAT-26 04/13/2015  Patient Report of Weight-Ideal 117 lb         PHQ-SADS 05/24/2015 04/29/2015 04/13/2015  PHQ-15 11 12 14   GAD-7 4 7 9   PHQ-9 5 9 8   Comment Somewhat difficult Somewhat difficult Somewhat difficult     Treatment plan at last visit included continue Zyprexa 1.25 mg at bedtime and continue Prozac; For HA's, she was seen by Dr. Sharene SkeansHickling and was to continue increased Topamax ER 150 mg dose and keep HA journal.  Monitor BP twice daily x 1 week - consider hold Atenolol as recommended by Dr. Mayer Camelatum if pressure running below systolic 90. Dr. Marina GoodellPerry also advised her to restart probiotic.   Clinical Staff Visit Tasks:   - Urine GC/CT due? No, negative 04/13/15 - Psych Screenings Due? No - DE intake EVS   Provider Visit Tasks: - Assess eating patterns - Assess anxiety - Assess medication side effects and benefits - Pertinent Labs? No  Growth Metrics: Ideal BMI for age: 6221.6 Previous growth data: weight/age 31-90th%; height/age at 75%; BMI/age 28-85% Goal BMI range based on growth chart data: 22-25 Goal weight range based on growth chart data: 130-145 lb Goal rate of weight gain: 0.5 lb/week  Monitoring Guidelines for Zyprexa - Hgba1c at baseline, 3 months after initiation, then annually if normal, every 3 months if abnormal: Due 07/2015 - Lipids at baseline, 3 months after initiation, then every 2 years if normal, annually if abnormal: Due 07/2015 - CMP annually if normal, as needed if abnormal: Due 04/2016  - CBC annually if normal, as needed if abnormal: Due 04/2016  - Prolactin if change in menstruation, libido, development of galactorrhea, erectile and ejaculatory function

## 2015-06-08 ENCOUNTER — Ambulatory Visit (INDEPENDENT_AMBULATORY_CARE_PROVIDER_SITE_OTHER): Payer: BLUE CROSS/BLUE SHIELD | Admitting: Family

## 2015-06-08 ENCOUNTER — Encounter: Payer: Self-pay | Admitting: Family

## 2015-06-08 VITALS — BP 103/72 | HR 83 | Ht 65.16 in | Wt 127.2 lb

## 2015-06-08 DIAGNOSIS — R1031 Right lower quadrant pain: Secondary | ICD-10-CM

## 2015-06-08 DIAGNOSIS — R1032 Left lower quadrant pain: Secondary | ICD-10-CM

## 2015-06-08 DIAGNOSIS — R000 Tachycardia, unspecified: Secondary | ICD-10-CM

## 2015-06-08 DIAGNOSIS — G90A Postural orthostatic tachycardia syndrome (POTS): Secondary | ICD-10-CM

## 2015-06-08 DIAGNOSIS — Z1389 Encounter for screening for other disorder: Secondary | ICD-10-CM | POA: Diagnosis not present

## 2015-06-08 DIAGNOSIS — I951 Orthostatic hypotension: Secondary | ICD-10-CM

## 2015-06-08 DIAGNOSIS — G8929 Other chronic pain: Secondary | ICD-10-CM | POA: Diagnosis not present

## 2015-06-08 DIAGNOSIS — E441 Mild protein-calorie malnutrition: Secondary | ICD-10-CM

## 2015-06-08 DIAGNOSIS — F5000 Anorexia nervosa, unspecified: Secondary | ICD-10-CM

## 2015-06-08 LAB — POCT URINALYSIS DIPSTICK
BILIRUBIN UA: NEGATIVE
Glucose, UA: NEGATIVE
KETONES UA: NEGATIVE
NITRITE UA: NEGATIVE
PH UA: 7
Protein, UA: NEGATIVE
RBC UA: NEGATIVE
Spec Grav, UA: 1.01
Urobilinogen, UA: NEGATIVE

## 2015-06-08 NOTE — Progress Notes (Signed)
THIS RECORD MAY CONTAIN CONFIDENTIAL INFORMATION THAT SHOULD NOT BE RELEASED WITHOUT REVIEW OF THE SERVICE PROVIDER.  Adolescent Medicine Consultation Follow-Up Visit Jessica [name]  is a 19 y.o. female referred by [name], MD here today for follow-up.    My Chart Activated?   yes   Previsit planning completed:  Yes Patient ID: Jessica [name], female   DOB: 06-26-1996, 19 y.o.   MRN: [id number] Pre-Visit Planning  Jessica [name]  is a 19 y.o. female referred by Norman Clay, MD.   Last seen in [facility] on 05/24/15 for AN B/P subtype  Previous Psych Screenings?  Yes   EAT-26 04/13/2015  Patient Report of Weight-Ideal 117 lb         PHQ-SADS 05/24/2015 04/29/2015 04/13/2015  PHQ-15 11 12 14   GAD-7 4 7 9   PHQ-9 5 9 8   Comment Somewhat difficult Somewhat difficult Somewhat difficult     Treatment plan at last visit included continue Zyprexa 1.25 mg at bedtime and continue Prozac; For HA's, she was seen by Dr. Sharene Skeans and was to continue increased Topamax ER 150 mg dose and keep HA journal.  Monitor BP twice daily x 1 week - consider hold Atenolol as recommended by Dr. Mayer Camel if pressure running below systolic 90. Dr. Marina Goodell also advised her to restart probiotic.   Clinical Staff Visit Tasks:   - Urine GC/CT due? No, negative 04/13/15 - Psych Screenings Due? No - DE intake EVS   Provider Visit Tasks: - Assess eating patterns - Assess anxiety - Assess medication side effects and benefits - Pertinent Labs? No  Growth Metrics: Ideal BMI for age: 34.6 Previous growth data: weight/age 42-90th%; height/age at 75%; BMI/age 40-85% Goal BMI range based on growth chart data: 22-25 Goal weight range based on growth chart data: 130-145 lb Goal rate of weight gain: 0.5 lb/week  Monitoring Guidelines for Zyprexa - Hgba1c at baseline, 3 months after initiation, then annually if normal, every 3 months if abnormal: Due 07/2015 - Lipids at baseline, 3 months after  initiation, then every 2 years if normal, annually if abnormal: Due 07/2015 - CMP annually if normal, as needed if abnormal: Due 04/2016  - CBC annually if normal, as needed if abnormal: Due 04/2016  - Prolactin if change in menstruation, libido, development of galactorrhea, erectile and ejaculatory function    Growth Chart Viewed? yes   History was provided by the patient and mother.  PCP Confirmed?  Yes, [name], MD   HPI:   -Zyprexa stopped for a few days, at least a week ago, d/t night sweats; restarted after her appt with Vernona Rieger.  -Unsure if she can tolerate the night sweats; only stopped for 3 nights, unable to ascertain if she noticed a difference from withdrawal of medication aside from no night sweats.  -Mom did not email log of BPs to Dr. Marina Goodell, however she has a written list with her today. No fainting spells, dizziness reported. There 2 readings below systolic 90, however mom notes she is hesitant to change the atenolol because POTS symptoms have been improved. Will scan those readings into chart today.  -Starting taking Probiotic and still having stomach troubles. Vernona Rieger recommended a tracking of food to see if there were any food triggers although she eats rare dairy and follows gluten-free diet.  -Symptoms include nausea with eating, sharp pains after food, gas and bloating; taking Miralax every day, still having some issues with constipation. Some nausea, no vomiting since mom called in and  they believe she had a virus (one episode of emesis).  -There was a conversation at one time about GI consult, however mom is unsure if that is necessary at this point although states she is willing to see one if she needs one.   -Next appt with [name] tomorrow.    Patient's last menstrual period was 06/01/2015 (approximate). Allergies  Allergen Reactions  . Amoxicillin Rash   Current Outpatient Prescriptions on File Prior to Visit  Medication Sig Dispense Refill  . atenolol  (TENORMIN) 25 MG tablet 1 tablet in the morning    . Calcium-Vitamin D-Vitamin K (VIACTIV PO) Take by mouth.    . fludrocortisone (FLORINEF) 0.1 MG tablet Take 1 tablet by mouth daily 90 tablet 5  . FLUoxetine (PROZAC) 20 MG tablet Take 1.5 tablets (30 mg total) by mouth daily. 45 tablet 1  . ibuprofen (ADVIL,MOTRIN) 200 MG tablet Take 200 mg by mouth every 6 (six) hours as needed.    . Multiple Vitamins-Minerals (MULTIVITAMIN ADULT PO) Take by mouth.    . norethindrone-ethinyl estradiol-iron (JUNEL FE 1.5/30) 1.5-30 MG-MCG tablet Take 1 tablet by mouth daily. 1 Package 11  . OLANZapine (ZYPREXA) 2.5 MG tablet Take 0.5 tablets (1.25 mg total) by mouth at bedtime. 30 tablet 0  . polyethylene glycol (MIRALAX / GLYCOLAX) packet Take 17 g by mouth daily.    . SUMAtriptan (IMITREX) 25 MG tablet Take 25 mg by mouth every 2 (two) hours as needed for migraine. May repeat in 2 hours if headache persists or recurs.    . Topiramate ER 150 MG CS24   5   No current facility-administered medications on file prior to visit.    ROS   Confidentiality was discussed with the patient and if applicable, with caregiver as well.  Patient's personal or confidential phone number:    The following portions of the patient's history were reviewed and updated as appropriate: allergies, current medications, past family history, past medical history, past social history, past surgical history and problem list.  Physical Exam:  Filed Vitals:   06/08/15 1405 06/08/15 1418 06/08/15 1420  BP:  93/58 103/72  Pulse:  68 83  Height: 5' 5.16" (1.655 m)    Weight: 127 lb 3.2 oz (57.698 kg)     BP 103/72 mmHg  Pulse 83  Ht 5' 5.16" (1.655 m)  Wt 127 lb 3.2 oz (57.698 kg)  BMI 21.07 kg/m2  LMP 06/01/2015 (Approximate) Body mass index: body mass index is 21.07 kg/(m^2). Blood pressure percentiles are 29% systolic and 80% diastolic based on 2000 NHANES data. Blood pressure percentile targets: 90: 122/77, 95: 126/81, 99  + 5 mmHg: 138/93.  Wt Readings from Last 3 Encounters:  06/08/15 127 lb 3.2 oz (57.698 kg) (48 %*, Z = -0.05)  05/24/15 123 lb 6.4 oz (55.974 kg) (40 %*, Z = -0.24)  05/14/15 120 lb 5.9 oz (54.6 kg) (34 %*, Z = -0.40)   * Growth percentiles are based on CDC 2-20 Years data.    Growth Metrics: Ideal BMI for age: 1221.6  Previous growth data: weight/age 59-90th%; height/age at 75%; BMI/age 46-85% Goal BMI range based on growth chart data: 22-25 Goal weight range based on growth chart data: 130-145 lb Goal rate of weight gain: 0.5 lb/week   Physical Exam  Constitutional: No distress.  Neck: No thyromegaly present.  Cardiovascular: Normal rate and regular rhythm.   No murmur heard. Pulmonary/Chest: Breath sounds normal.  Abdominal: Soft. She exhibits no distension and no mass. There  is no tenderness. There is no rebound and no guarding.  Musculoskeletal: She exhibits no edema.  Lymphadenopathy:    She has no cervical adenopathy.  Neurological: She is alert.  Skin: Skin is warm and dry.     Nursing note and vitals reviewed.    Assessment/Plan: 1. Anorexia nervosa 2. Malnutrition of mild degree [name]: 75% to less than 90% of standard weight) (HCC) -continue therapies. Improved weight and vitals.  -continue zyprexa and prozac; discussed that zyprexa may be the trigger for night sweats if it stopped when she d/c'd medication, however expressed that things are going well with zyprexa and would want to continue that medication  -repeat PHQSADS next OV -Dr. Marina Goodell requested for q 2 week visits for next three visits after her 10//31/16.  -Will schedule accordingly (will notify patient, originally scheduled for 4 weeks)   3. Postural orthostatic tachycardia syndrome -reviewed BP log; asymptomatic with those readings. -defer to Dr. Noel Christmas recommendation; no change today   4. Chronic bilateral lower abdominal pain -she was advised to add probiotic back on 05/24/15 OV. I would  recommend giving this a couple more weeks to see if advantageous. Of note, Dr. Lamar Sprinkles note also mentions family history of chronic abdominal pain. Consider GI consult at next OV if new/worsening symptoms; would want to confer with PCP and also consider any implications of GI workup in the context of DE recovery.   5. Screening for genitourinary condition  - POCT urinalysis dipstick   Follow-up:  Return in about 4 weeks (around 07/06/2015) for with Delorse Lek, MD, DE management.   Medical decision-making:  > 25 minutes spent, more than 50% of appointment was spent discussing diagnosis and management of symptoms

## 2015-06-09 ENCOUNTER — Encounter: Payer: Self-pay | Admitting: Family

## 2015-06-09 ENCOUNTER — Telehealth: Payer: Self-pay | Admitting: Family

## 2015-06-09 ENCOUNTER — Ambulatory Visit: Payer: BLUE CROSS/BLUE SHIELD | Admitting: *Deleted

## 2015-06-09 NOTE — Progress Notes (Signed)
Appointment start time: 1400  Appointment end time: 1445  Patient was seen on 06/09/15 for nutrition counseling pertaining to disordered eating.  She is accompanied by her mother  Primary care provider: Dr. Rana SnareLowe Therapist: Noni SaupeHeather [name], weekly    Any other medical team members: Dr. Marina GoodellPerry Parents: Jessica LevanJoy [name]  Assessment:  Jessica CousinsCiara states things are going well.  This past weekend she tried salmon and loved it.! Did walk a couple times by herself.  She was told to walk with an escort for 15 minutes.  She walked for 1 hour. She enjoyed walking for the stress relief.  Mom was not well this week and was not able to remind her to track her GI symptoms or to drink her electrolyte beverages.  [name] no longer has to be reminded to eat all her meals/snacks, but she does forget her beverages.     Growth Metrics: Ideal BMI for age: 4521.6 BMI 11/716: 21.07 % Ideal:  97.5% Previous growth data: weight/age  60-90th%; height/age at 75%; BMI/age 41-85% Goal BMI range based on growth chart data: 22-25 Goal weight range based on growth chart data: 130-145 lb Goal rate of weight gain:  0.5 lb/week   Mental health diagnosis: AN, B/P subtype   Dietary assessment: mom says she's always been picky; currently following gluten-free diet  Safe foods include: salad with minimal dressing, fruits, oatmeal, rice chex Avoided foods include:desserts, meat, pasta, fast food, chips, pizza, bread, all other potatoes besides baked  24 hour recall Carnation salad with chicken, cheese, craisins, pecans, vinegarettte trail mix (1 cup) Green goddess salad and tomato soup Apple with peanut butter gatorade Propel Water Cranberry sprite  Monday luna bar Rice chex with banana and soy milk Apple with peanut butter blakc bean burger with cheese and guac and sweet potato fries and dip Cherry fluff Propel gatorade Water Cranberry sprite  Sunday luna bar Chicken nachos (ate them all) Coffee luna bar Rice chex  with banana and soy milk Drinkable yogurt Water   Estimated energy intake: 1600 kcal  Estimated energy needs: 2200 kcal for weight restoration    275 g CHO 110 g pro 73 g fat  Nutrition Diagnosis: NI-1.4 Inadequate energy intake As related to restricting and purging.  As evidenced by weight loss of 16 pounds .  Intervention/Goals: Nutrition counseling provided.  Debunked diet myths.  Challenged cognitive distortions  Goals: 8 oz Gatorade with each meal-  Put sticky note in [name] as reminder Propel throughout the day 2 things with breakfast- ok to have second thing as "snack" in between morning classes Ok for 15 minute walk daily- with an escort.  If can't comply with escort, will discontinue exercise permission Or ok for yoga 2/week at home Try ginger for GI distress or heat (heating pad, hot water bottle, etc) Track GI symptoms   Monitoring and Evaluation: Patient will follow up in 1 week

## 2015-06-09 NOTE — Telephone Encounter (Signed)
[name],  Will you please schedule her for 2 week follow-up?  I scheduled her for 4 weeks; Marina Goodellerry wants to see her q 2 weeks for the next 3 appts.

## 2015-06-10 ENCOUNTER — Other Ambulatory Visit: Payer: Self-pay | Admitting: Pediatrics

## 2015-06-16 ENCOUNTER — Encounter: Payer: BLUE CROSS/BLUE SHIELD | Admitting: *Deleted

## 2015-06-16 DIAGNOSIS — F509 Eating disorder, unspecified: Secondary | ICD-10-CM | POA: Diagnosis not present

## 2015-06-16 DIAGNOSIS — F5000 Anorexia nervosa, unspecified: Secondary | ICD-10-CM

## 2015-06-16 NOTE — Progress Notes (Signed)
Appointment start time: 1515  Appointment end time: 1615  Patient was seen on 06/16/15 for nutrition counseling pertaining to disordered eating.  She is accompanied by her mother  Primary care provider: Dr. Rana SnareLowe Therapist: Noni SaupeHeather Kitchen, weekly    Any other medical team members: Dr. Marina GoodellPerry Parents: Bonnita LevanJoy Thomas  Assessment:  [name] states things are going well.   Had chocolate pie for birthday and loved it.  She has been eating the leftovers. It was really good.  Her anxiety was not "too bad".  She had ice cream on her actual birthday and counted it as her after dinner snack.  Also got gluten-free crepes and they were good.  Had half of savory and sweet crepe Just bought ginger tea, hasn't had it yet She is anxious about Thanksgiving.  Has been tracking her GI symptoms somewhat.  Symptoms appear almost immediately after eating    Growth Metrics: Ideal BMI for age: 6321.6 BMI 06/08/15: 21.07 % Ideal:  97.5% Previous growth data: weight/age  8-90th%; height/age at 75%; BMI/age 48-85% Goal BMI range based on growth chart data: 22-25 Goal weight range based on growth chart data: 130-145 lb Goal rate of weight gain:  0.5 lb/week   Mental health diagnosis: AN, B/P subtype   Dietary assessment: mom says she's always been picky; currently following gluten-free diet  Safe foods include: salad with minimal dressing, fruits, oatmeal, rice chex Avoided foods include:desserts, meat, pasta, fast food, chips, pizza, bread, all other potatoes besides baked  24 hour recall B: quest bar and coffee with creamer Rex chex, banana, soy milk Apple with peanut butter Chicken chili with tortilla chips (10-12) Hot chocolate Propel Gluten free poptart   Monday Kind bar and water Hot chocolate Salad with cheese, eggs, nuts, oilive, dressing Gatorade.  Chocolate pie *upset stomach 15 min later [place] yogurt with granola Chicken salad with potato soup.  *felt nauseated afterwards gatorade   Banana   Today  B: eggs, cinnamon rasin toast with pumpkin butter.  Water L: Chicken chili with cheese, 8 tortilla chips 10 oz gatorade   Estimated energy intake: 1800 kcal  Estimated energy needs: 2200 kcal for weight restoration    275 g CHO 110 g pro 73 g fat  Nutrition Diagnosis: NI-1.4 Inadequate energy intake As related to restricting and purging.  As evidenced by weight loss of 16 pounds .  Intervention/Goals: Nutrition counseling provided.  Discussed her anxieties about Thanksgiving and brainstormed ways to get through the day.  Gave "10 ways to take back the holidays from Ed" handout, altered meal plan for the day, and came up with response if her family makes comments about her eating  As she is reporting GI distress almost immediately after eating and not enough time for the food to be digested/absorbed, discussed possibility that some GI distress is due to increased anxiety about trying new food.  Suggested coping mechanism before eating: ie deep breathing.  suggested talking with therapist further....   Goals: Continue to track GI symptoms Try ginger teas Continue yoga and walking with escort   Monitoring and Evaluation: Patient will follow up in 1 week

## 2015-06-22 ENCOUNTER — Encounter: Payer: Self-pay | Admitting: Family

## 2015-06-22 ENCOUNTER — Ambulatory Visit (INDEPENDENT_AMBULATORY_CARE_PROVIDER_SITE_OTHER): Payer: BLUE CROSS/BLUE SHIELD | Admitting: Family

## 2015-06-22 VITALS — BP 118/75 | HR 88 | Ht 64.65 in | Wt 131.0 lb

## 2015-06-22 DIAGNOSIS — Z1389 Encounter for screening for other disorder: Secondary | ICD-10-CM | POA: Diagnosis not present

## 2015-06-22 DIAGNOSIS — G8929 Other chronic pain: Secondary | ICD-10-CM | POA: Diagnosis not present

## 2015-06-22 DIAGNOSIS — R1032 Left lower quadrant pain: Secondary | ICD-10-CM

## 2015-06-22 DIAGNOSIS — E441 Mild protein-calorie malnutrition: Secondary | ICD-10-CM

## 2015-06-22 DIAGNOSIS — F5000 Anorexia nervosa, unspecified: Secondary | ICD-10-CM | POA: Diagnosis not present

## 2015-06-22 DIAGNOSIS — R1031 Right lower quadrant pain: Secondary | ICD-10-CM

## 2015-06-22 LAB — POCT URINALYSIS DIPSTICK
BILIRUBIN UA: NEGATIVE
Blood, UA: NEGATIVE
Glucose, UA: NEGATIVE
KETONES UA: NEGATIVE
Nitrite, UA: NEGATIVE
PH UA: 5.5
PROTEIN UA: NEGATIVE
SPEC GRAV UA: 1.01
Urobilinogen, UA: NEGATIVE

## 2015-06-22 NOTE — Progress Notes (Signed)
THIS RECORD MAY CONTAIN CONFIDENTIAL INFORMATION THAT SHOULD NOT BE RELEASED WITHOUT REVIEW OF THE SERVICE PROVIDER.  Adolescent Medicine Consultation Follow-Up Visit Jessica [name]  is a 19 y.o. female referred by [name], [name], MD here today for follow-up.    Previsit planning completed:  No  Growth Chart Viewed? yes   History was provided by the patient and mother.  PCP Confirmed?  Yes, Loyola MastMelissa Lowe, MD   My Chart Activated?   yes   HPI:    Things going well; no concerns at this time. Still seeing [name] weekly.  Next appt is tomorrow. She enjoyed chocolate pie on her birthday.  [place]  [facility] so excited about the long break.  Feels the probiotic has been of benefit; she has been taking it roughly one month and is having fewer symptoms of nausea, gas and bloating.  Mom reports that she had some of those symptoms [facility] when she usually eats tomato soup. Mom says she feels it was anxiety around trying something new and Kierstynn agrees maybe she was anxious about that. Otherwise no other concerns are noted at this time.  Mom has not been checking BPs, however here has been no dizzy or fainting spells.   Patient's last menstrual period was 06/01/2015 (approximate). Allergies  Allergen Reactions  . Amoxicillin Rash   Current Outpatient Prescriptions on File Prior to Visit  Medication Sig Dispense Refill  . atenolol (TENORMIN) 25 MG tablet 1 tablet in the morning    . Calcium-Vitamin D-Vitamin K (VIACTIV PO) Take by mouth.    . fludrocortisone (FLORINEF) 0.1 MG tablet Take 1 tablet by mouth daily 90 tablet 5  . FLUoxetine (PROZAC) 20 MG tablet TAKE 1 AND 1/2 TABLETS (30 MG TOTAL) BY MOUTH MOUTH DAILY 45 tablet 1  . ibuprofen (ADVIL,MOTRIN) 200 MG tablet Take 200 mg by mouth every 6 (six) hours as needed.    . Multiple Vitamins-Minerals (MULTIVITAMIN ADULT PO) Take by mouth.     . norethindrone-ethinyl estradiol-iron (JUNEL FE 1.5/30) 1.5-30 MG-MCG tablet Take 1 tablet by mouth daily. 1 Package 11  . OLANZapine (ZYPREXA) 2.5 MG tablet Take 0.5 tablets (1.25 mg total) by mouth at bedtime. 30 tablet 0  . polyethylene glycol (MIRALAX / GLYCOLAX) packet Take 17 g by mouth daily.    . Probiotic Product (PHILLIPS COLON HEALTH PO) Take by mouth.    . SUMAtriptan (IMITREX) 25 MG tablet Take 25 mg by mouth every 2 (two) hours as needed for migraine. May repeat in 2 hours if headache persists or recurs.    . Topiramate ER 150 MG CS24   5   No current facility-administered medications on file prior to visit.    Growth Metrics: Ideal BMI for age: 521.6  Previous growth data: weight/age 4-90th%; height/age at 75%; BMI/age 33-85% Goal BMI range based on growth chart data: 22-25 Goal weight range based on growth chart data: 130-145 lb Goal rate of weight gain: 0.5 lb/week  Confidentiality was discussed with the patient and if applicable, with caregiver as well.   The following portions of the patient's history were reviewed and updated as appropriate: allergies, current medications, past family history, past medical history, past social history, past surgical history and problem list.  Physical Exam:  Filed Vitals:   06/22/15 1450 06/22/15 1459 06/22/15 1501  BP:  99/58 118/75  Pulse:  74 88  Height: 5'  4.65" (1.642 m)    Weight: 131 lb (59.421 kg)     BP 118/75 mmHg  Pulse 88  Ht 5' 4.65" (1.642 m)  Wt 131 lb (59.421 kg)  BMI 22.04 kg/m2  LMP 06/01/2015 (Approximate) Body mass index: body mass index is 22.04 kg/(m^2). Facility age limit for growth percentiles is 20 years.   Wt Readings from Last 3 Encounters:  06/22/15 131 lb (59.421 kg)  06/08/15 127 lb 3.2 oz (57.698 kg) (48 %*, Z = -0.05)  05/24/15 123 lb 6.4 oz (55.974 kg) (40 %*, Z = -0.24)   * Growth percentiles are based on CDC 2-20 Years data.   Temp Readings from Last 3 Encounters:  10/26/14  98.3 F (36.8 C) Oral  10/16/14 98 F (36.7 C) Oral  07/03/11 97.5 F (36.4 C) Oral   BP Readings from Last 3 Encounters:  06/22/15 118/75  06/08/15 103/72  05/24/15 118/77   Pulse Readings from Last 3 Encounters:  06/22/15 88  06/08/15 83  05/24/15 81    Physical Exam  Constitutional: She is oriented to person, place, and time. No distress.  Pleasant, engaging today   HENT:  Head: Normocephalic and atraumatic.  Mouth/Throat: No oropharyngeal exudate.  Mild erythematous oropharynx   Eyes: EOM are normal. Pupils are equal, round, and reactive to light. No scleral icterus.  Neck: Normal range of motion. No thyromegaly present.  Cardiovascular: Normal rate, regular rhythm and normal heart sounds.   Pulmonary/Chest: Effort normal and breath sounds normal.  Abdominal: Soft.  Musculoskeletal: Normal range of motion. She exhibits no edema or tenderness.  Lymphadenopathy:    She has no cervical adenopathy.  Neurological: She is alert and oriented to person, place, and time. No cranial nerve deficit.  Skin: Skin is warm and dry. No rash noted.  Psychiatric: She has a normal mood and affect. Her behavior is normal.      Assessment/Plan:  1. Anorexia nervosa 2. Malnutrition of mild degree [name]: 75% to less than 90% of standard weight) (HCC)  -stable on current POC. -continue with Vernona Rieger -Per Dr. Lamar Sprinkles last notes, should be able to extend OVs to 4 week checks if stable again next OV.   3. Chronic bilateral lower abdominal pain -improving on probiotic use -continue to monitor   4. Screening for genitourinary condition -UA stable reviewed.  CT urinalysis dipstick  Follow-up:  Return keep scheduled appt with Dr. Marina Goodell for 2 weeks.   Medical decision-making:  > 15 minutes spent, more than 50% of appointment was spent discussing diagnosis and management of symptoms

## 2015-06-23 ENCOUNTER — Ambulatory Visit: Payer: BLUE CROSS/BLUE SHIELD | Admitting: *Deleted

## 2015-06-23 ENCOUNTER — Encounter: Payer: BLUE CROSS/BLUE SHIELD | Attending: Pediatrics | Admitting: *Deleted

## 2015-06-23 DIAGNOSIS — Z713 Dietary counseling and surveillance: Secondary | ICD-10-CM | POA: Insufficient documentation

## 2015-06-23 DIAGNOSIS — F509 Eating disorder, unspecified: Secondary | ICD-10-CM | POA: Insufficient documentation

## 2015-06-23 NOTE — Progress Notes (Signed)
Appointment start time: 1400  Appointment end time: 1500  Patient was seen on 06/23/15 for nutrition counseling pertaining to disordered eating.  She is accompanied by her mother  Primary care provider: Dr. Rana Snare Therapist: Noni Saupe, weekly    Any other medical team members: Dr. Marina Goodell Parents: [name]  Assessment:  Jessica [name] states things are going well.  Thanksgiving was not too bad.  She doesn't feel like she ate too much (dietary recall reveals she ate too little) but she did eat more of a variety of things so she did push her boundaries some. Her teacher had an assignment where the students needed to track their foods and calories.  This provider asked the teacher that Jessica [name] be exempt from that assignment; that request was granted, but Jessica [name] did get weighed at school.  The scale read 145 lb and she was quite alarmed.  Per scale at [facility] yesterday, she weighs 131 lb.    She states her GI symptoms are improving slowly.  Had 3 upset stomachs this week, instead of daily.  She has a birthday dinner tonight and Jessica [name] and she is somewhat anxious about that   Growth Metrics: Ideal BMI for age: 31.6 BMI 06/08/15: 21.07 % Ideal:  97.5% Previous growth data: weight/age  14-90th%; height/age at 75%; BMI/age 19-85% Goal BMI range based on growth chart data: 22-25 Goal weight range based on growth chart data: 130-145 lb Goal rate of weight gain:  0.5 lb/week   Mental health diagnosis: AN, B/P subtype   Dietary assessment: mom says she's always been picky; currently following gluten-free diet  Safe foods include: salad with minimal dressing, fruits, oatmeal, rice chex Avoided foods include:desserts, meat, pasta, fast food, chips, pizza, bread, all other potatoes besides baked  Thanksgiving B: yogurt with granola L: cherry fluff, steamed broccoli, sweet potato casserole, deviled egg, potato salad, pecan pie mini muffin, strawberry dessert and propel (all small portions) S: some  snacks: fruit, 2-3 cracker with cream cheese and olive dip D: 1/2 deviled egg, gluten free dressing, sweet potato casserole, salad (some cheese) S: pecan pie mini muffin, sliver pumpkin pie, sliver chocolate pie S: apple and peanut butter  Friday B: oeamtel with pecans Dressing, kale salad, sweet potatoe Sliver chocolate pie Ginger tea chicke fil a kids' meal with sprite carmael brulee latte Propel   Sunday Quest bar 2 cups gatorade some chips with salsa, water 1 6in cheese quesadilla 3 bites pie, apple with peanut butter Caramel brulee latte 1/2 salad from [employer] cookie 2 cups gatorade  Today Quest bar.  20 oz water Black bean burger with cheese, guac.  Some chips and salsa   Physical activity: None this week   Estimated energy intake: 1800 kcal  Estimated energy needs: 2200 kcal for weight restoration    275 g CHO 110 g pro 73 g fat  Nutrition Diagnosis: NI-1.4 Inadequate energy intake As related to restricting and purging.  As evidenced by weight loss of 16 pounds .  Intervention/Goals: Nutrition counseling provided.  Assured her she does not weigh 145 lb.  Her clothes still fit (tight, but fit).  It is not possible that she gained that amount of weight.  Encouraged her to challenge those thoughts. Reiterated importance of challenging ED thoughts more often.  Discussed her dinner plans tonight and came up with a plan.   Goals: Ensure 2 items with breakfast Order off adult menu once Challenge Ed thoughts Continue to track GI symptoms Try ginger teas Continue yoga  and walking with escort- not exercise for calorie burning purposes  By christmas- eat gluten-free pasta   Monitoring and Evaluation: Patient will follow up in 2 weeks

## 2015-06-24 ENCOUNTER — Other Ambulatory Visit: Payer: Self-pay | Admitting: Family

## 2015-06-24 MED ORDER — RANITIDINE HCL 150 MG PO TABS: 150.0000 mg | ORAL_TABLET | Freq: Two times a day (BID) | ORAL | Status: DC

## 2015-07-05 ENCOUNTER — Ambulatory Visit: Payer: BLUE CROSS/BLUE SHIELD | Admitting: *Deleted

## 2015-07-07 ENCOUNTER — Ambulatory Visit: Payer: BLUE CROSS/BLUE SHIELD | Admitting: *Deleted

## 2015-07-07 ENCOUNTER — Encounter: Payer: Self-pay | Admitting: Pediatrics

## 2015-07-07 ENCOUNTER — Encounter: Payer: BLUE CROSS/BLUE SHIELD | Attending: Pediatrics | Admitting: *Deleted

## 2015-07-07 DIAGNOSIS — Z713 Dietary counseling and surveillance: Secondary | ICD-10-CM | POA: Insufficient documentation

## 2015-07-07 DIAGNOSIS — F509 Eating disorder, unspecified: Secondary | ICD-10-CM | POA: Insufficient documentation

## 2015-07-07 NOTE — Progress Notes (Signed)
Pre-Visit Planning  [name]  is a 19 y.o. female referred by Norman ClayLOWE,[name] V, MD.   [facility] on 06/22/2015 for anorexia, anxiety and abdominal pain.   Previous Psych Screenings? yes, PHQSADs 05/24/2015, EAT-26 04/13/2015  Treatment plan at last visit included continue current medi regimen, cont probiotic, cont with treatment team.  Clinical Staff Visit Tasks:   - Urine GC/CT due? no - Psych Screenings Due? yes, EAT26 - DE w/o EVS  Provider Visit Tasks: - Assess disordered eating patterns, mood and anxiety - Assess medication compliance, benefits and side effects - BHC Involvement? No - Pertinent Labs? No  Monitoring Guidelines for Zyprexa - Hgba1c at baseline, 3 months after initiation, then annually if normal, every 3 months if abnormal:  Due 07/2015 - Lipids at baseline, 3 months after initiation, then every 2 years if normal, annually if abn1ormal:  Due 04/2017 - CMP annually if normal, as needed if abnormal:  Due 04/2016  - CBC annually if normal, as needed if abnormal:  Due 04/2016  - Prolactin if change in menstruation, libido, development of galactorrhea, erectile and ejaculatory function

## 2015-07-07 NOTE — Progress Notes (Signed)
Appointment start time: 0930  Appointment end time: 1030  Patient was seen on 07/07/15 for nutrition counseling pertaining to disordered eating.  She is accompanied by her mother  Primary care provider: Dr. Rana Snare Therapist: Noni Saupe, weekly    Any other medical team members: Dr. Marina Goodell Parents: [name]  Assessment:  Jessica [name] states things are going well.  School is over for the semester and she is enjoying relaxing.  Thinks Culturelle is making her more gassy than the previous probiotic.  Isn't sure if GI symptoms are better or worse or the same as she is not tracking symptoms regularly.  Doesn't always get her snacks in, not hungry after dinner.  "Feels fat."  Mom notes she's made several comments about her stomach, looking "pregnant" and feeling fat.  She still has clothes that are too small for her.  Isn't drinking gatorade as prescribed as it "burns my throat."  Doesn't get in her Propel or water regularly either.  Has not been checking BP at home.  Will see Dr. Marina Goodell tomorrow   Growth Metrics: Ideal BMI for age: 80.6 BMI 06/08/15: 21.07 % Ideal:  97.5% Previous growth data: weight/age  81-90th%; height/age at 75%; BMI/age 71-85% Goal BMI range based on growth chart data: 22-25 Goal weight range based on growth chart data: 130-145 lb Goal rate of weight gain:  0.5 lb/week   Mental health diagnosis: AN, B/P subtype   Dietary assessment: mom says she's always been picky; currently following gluten-free diet  Safe foods include: salad with minimal dressing, fruits, oatmeal, rice chex Avoided foods include:desserts, meat, pasta, fast food, chips, pizza, bread, all other potatoes besides baked  24 hour recall 2 frozen gluten free waffles.  1/2 cup coffee Chicken fil a kids meal and sprite Tall peppermint mocha Chocolate eclair cake (felt sick ~1 hr afterwards) Vegetable soup Apple and peanut butter  Monday luna bar Hot chocolate Pimento cheese and bagel chips and crackers   Rice chex treat Apple with peanut butter Vegetable soup and grilled cheese  Sunday luna bar and small coffee Leftover ACP, peanut brittle Rice chex treat with coffee grilled chicken, sweet potatoes, propel  Friday luna bar and peppermint mocha 6 grilled nuggets Banana and trail mix 1/2 ghassan's  Thursday luna bar and hot chocolate Pimento cheese and bagel chips Apple with peanut butter Grilled chicken and sweet potato casserole, fruit cocktail, broccoli and cheese.  *Did not feel stuffed and was actually hungry later and requested that Monster cookie Monster cookie and coffee    Estimated energy intake: 1200-1500 kcal  Estimated energy needs: 2200 kcal for weight restoration    275 g CHO 110 g pro 73 g fat  Nutrition Diagnosis: NI-1.4 Inadequate energy intake As related to restricting and purging.  As evidenced by weight loss of 16 pounds .  Intervention/Goals: Nutrition counseling provided.  Challenged cognitive distortions.  "Fat" is not a feeling; it is a physical description and she does not meet those qualifications.  She has fat; she has a healthy amount of fat necessary for normal bodily functions.  Having fat does not mean a person is fat.  Discussed how too small clothes can exaccerbate body dissatisfaction.  She stated that she "got sick and lost all that weight" (meaning when she was diagnosed with POTS).  She was able to verbalize that she was not healthy when she was thinner and agreed to get rid of the too small clothes.  Discussed "Set point" and how body adjusts metabolic rate with  food intake. Cited the night she ate a larger dinner and then was hungry later for her cookie as evidence.   If BP is stable, don't need to drink Gatorade.  If BP not stable at visit with Dr. Marina GoodellPerry tomorrow, will need to add that back in   Goals: Ensure 2 items with breakfast Challenge Ed thoughts Continue to track GI symptoms Ensure adequate hydration Get rid of clothes  that are too small Ensure 5 eating occasions daily (if not able to meet this goal, will continue weekly visits in January; if able to meet goal, will do every other week)  By christmas- eat gluten-free pasta   Monitoring and Evaluation: Patient will follow up in 1 weeks

## 2015-07-08 ENCOUNTER — Ambulatory Visit (INDEPENDENT_AMBULATORY_CARE_PROVIDER_SITE_OTHER): Payer: BLUE CROSS/BLUE SHIELD | Admitting: Pediatrics

## 2015-07-08 ENCOUNTER — Other Ambulatory Visit: Payer: Self-pay | Admitting: Family

## 2015-07-08 ENCOUNTER — Encounter: Payer: Self-pay | Admitting: Pediatrics

## 2015-07-08 VITALS — BP 111/67 | HR 90 | Ht 65.47 in | Wt 131.2 lb

## 2015-07-08 DIAGNOSIS — Z1389 Encounter for screening for other disorder: Secondary | ICD-10-CM | POA: Diagnosis not present

## 2015-07-08 DIAGNOSIS — F5000 Anorexia nervosa, unspecified: Secondary | ICD-10-CM | POA: Diagnosis not present

## 2015-07-08 DIAGNOSIS — F4323 Adjustment disorder with mixed anxiety and depressed mood: Secondary | ICD-10-CM | POA: Diagnosis not present

## 2015-07-08 LAB — POCT URINALYSIS DIPSTICK
Bilirubin, UA: NEGATIVE
Blood, UA: NEGATIVE
Glucose, UA: NEGATIVE
Ketones, UA: NEGATIVE
NITRITE UA: NEGATIVE
PH UA: 7
PROTEIN UA: NEGATIVE
Spec Grav, UA: 1.01
UROBILINOGEN UA: NEGATIVE

## 2015-07-08 MED ORDER — FLUOXETINE HCL 40 MG PO CAPS: 40.0000 mg | ORAL_CAPSULE | Freq: Every day | ORAL | Status: DC

## 2015-07-08 NOTE — Patient Instructions (Signed)
Great to see you today and here is what we covered today:  - make sure to take your zantac twice daily consistently - you could try setting a phone alarm or using a pill organizer to help - we will increase your prozac dose to help with increasing anxiety - talk with [name] and Herbert SetaHeather about possibly using the recovery app - work on increasing your water intake and following your true meal plan to make sure you poop more regularly, that will reduce some of the bloating that you are experiencing

## 2015-07-08 NOTE — Progress Notes (Signed)
THIS RECORD MAY CONTAIN CONFIDENTIAL INFORMATION THAT SHOULD NOT BE RELEASED WITHOUT REVIEW OF THE SERVICE PROVIDER.  Adolescent Medicine Consultation Follow-Up Visit Jessica [name]  is a 19 y.o. female referred by [name], MD here today for follow-up.    Previsit planning completed:  yes Pre-Visit Planning  Jessica [name]  is a 19 y.o. female referred by Norman Clay, MD.   [facility] on 06/22/2015 for anorexia, anxiety and abdominal pain.   Previous Psych Screenings? yes, PHQSADs 05/24/2015, EAT-26 04/13/2015  Treatment plan at last visit included continue current medi regimen, cont probiotic, cont with treatment team.  Clinical Staff Visit Tasks:   - Urine GC/CT due? no - Psych Screenings Due? yes, EAT26 - DE w/o EVS  Provider Visit Tasks: - Assess disordered eating patterns, mood and anxiety - Assess medication compliance, benefits and side effects - BHC Involvement? No - Pertinent Labs? No  Monitoring Guidelines for Zyprexa - Hgba1c at baseline, 3 months after initiation, then annually if normal, every 3 months if abnormal:  Due 07/2015 - Lipids at baseline, 3 months after initiation, then every 2 years if normal, annually if abn1ormal:  Due 04/2017 - CMP annually if normal, as needed if abnormal:  Due 04/2016  - CBC annually if normal, as needed if abnormal:  Due 04/2016  - Prolactin if change in menstruation, libido, development of galactorrhea, erectile and ejaculatory function    Growth Chart Viewed? not applicable   History was provided by the patient and mother.  PCP Confirmed?  yes  My Chart Activated?   yes   HPI:    Pt reports recent increased anxiety.  Able to follow her meal plan but struggling with body acceptance.  Working with RD and therapist to address this issue.  Continues to have some acid reflux symptoms.  Has not been consistent about taking zantac.  Pt also has issues with constipation.  Patient's last  menstrual period was 06/29/2015. Allergies  Allergen Reactions  . Amoxicillin Rash   Current Outpatient Prescriptions on File Prior to Visit  Medication Sig Dispense Refill  . atenolol (TENORMIN) 25 MG tablet 1 tablet in the morning    . Calcium-Vitamin D-Vitamin K (VIACTIV PO) Take by mouth.    . fludrocortisone (FLORINEF) 0.1 MG tablet Take 1 tablet by mouth daily 90 tablet 5  . ibuprofen (ADVIL,MOTRIN) 200 MG tablet Take 200 mg by mouth every 6 (six) hours as needed.    . Multiple Vitamins-Minerals (MULTIVITAMIN ADULT PO) Take by mouth.    . norethindrone-ethinyl estradiol-iron (JUNEL FE 1.5/30) 1.5-30 MG-MCG tablet Take 1 tablet by mouth daily. 1 Package 11  . OLANZapine (ZYPREXA) 2.5 MG tablet Take 0.5 tablets (1.25 mg total) by mouth at bedtime. 30 tablet 0  . polyethylene glycol (MIRALAX / GLYCOLAX) packet Take 17 g by mouth daily.    . Probiotic Product (PHILLIPS COLON HEALTH PO) Take by mouth.    . SUMAtriptan (IMITREX) 25 MG tablet Take 25 mg by mouth every 2 (two) hours as needed for migraine. May repeat in 2 hours if headache persists or recurs.    . Topiramate ER 150 MG CS24   5   No current facility-administered medications on file prior to visit.    The following portions of the patient's history were reviewed and updated as appropriate: allergies, current medications and problem list.  Physical Exam:  Filed Vitals:   07/08/15 1012  BP: 111/67  Pulse: 90  Height: 5' 5.47" (1.663 m)  Weight: 131 lb 2.8 oz (59.5 kg)   BP 111/67 mmHg  Pulse 90  Ht 5' 5.47" (1.663 m)  Wt 131 lb 2.8 oz (59.5 kg)  BMI 21.51 kg/m2  LMP 06/29/2015 Body mass index: body mass index is 21.51 kg/(m^2). Facility age limit for growth percentiles is 20 years.  Physical Exam  Constitutional: No distress.  Neck: No thyromegaly present.  Cardiovascular: Normal rate and regular rhythm.   No murmur heard. Pulmonary/Chest: Breath sounds normal.  Abdominal: Soft. There is no tenderness. There  is no guarding.  Musculoskeletal: She exhibits no edema.  Lymphadenopathy:    She has no cervical adenopathy.  Neurological: She is alert.  Nursing note and vitals reviewed.    Wt Readings from Last 3 Encounters:  07/08/15 131 lb 2.8 oz (59.5 kg)  06/22/15 131 lb (59.421 kg)  06/08/15 127 lb 3.2 oz (57.698 kg) (48 %*, Z = -0.05)   * Growth percentiles are based on CDC 2-20 Years data.   EAT-26 07/13/2015  Total Score 28  Patient Report of Weight-Highest   Patient Report of Weight-Lowest   Patient Report of Weight-Ideal   Gone on eating binges where you feel that you may not be able to stop? Never  Ever made yourself sick (vomited) to control your weight or shape? 2-6 times a week  Ever used laxatives, diet pills or diuretics (water pills) to control your weight or shape? Never  Exercised more than 60 minutes a day to lose or to control your weight? Never  Lost 20 pounds or more in the past 6 months? No   EAT-26 04/13/2015  Total Score 37  Patient Report of Weight-Highest 130 lb  Patient Report of Weight-Lowest 112 lb  Patient Report of Weight-Ideal 117 lb  Gone on eating binges where you feel that you may not be able to stop?   Ever made yourself sick (vomited) to control your weight or shape?   Ever used laxatives, diet pills or diuretics (water pills) to control your weight or shape?   Exercised more than 60 minutes a day to lose or to control your weight?   Lost 20 pounds or more in the past 6 months?     Assessment/Plan: 19 yo female with anorexia nervosa and anxiety.  Nutritional status has improved substantially but her anxiety has worsened recently.  Discussed strategies over the holiday.  Will increase prozac but discussed that will take several weeks to have much effect.  Discussed that EAT 26 is still elevated but significantly improved in comparison to initial presentation. - discussed ways to remember to take zantac consistently - increase prozac dose - consider  using recovery app - increase water intake  Follow-up:  Return in about 2 weeks (around 07/22/2015) for DE f/u with extended vitals, with Dr. Marina GoodellPerry, with Neysa Bonitohristy.   Medical decision-making:  > 25 minutes spent, more than 50% of appointment was spent discussing diagnosis and management of symptoms

## 2015-07-08 NOTE — Telephone Encounter (Signed)
Prescription routed to wrong provider.  Please re-route to appropriate provider. 

## 2015-07-09 ENCOUNTER — Telehealth: Payer: Self-pay

## 2015-07-09 NOTE — Telephone Encounter (Signed)
LM for mother that dose was changed to 40 mg po daily.  Advised to call back if any further questions.

## 2015-07-09 NOTE — Telephone Encounter (Signed)
Mother called to speak with Dr. Marina GoodellPerry in regards to Jessica [name]'s recent change in her anxiety medication. Mother stated Dr. Marina GoodellPerry talked about increasing the dose but was not sure how much to increase. Mother can be reached at [phone].

## 2015-07-13 DIAGNOSIS — F4323 Adjustment disorder with mixed anxiety and depressed mood: Secondary | ICD-10-CM | POA: Insufficient documentation

## 2015-07-14 ENCOUNTER — Encounter: Payer: BLUE CROSS/BLUE SHIELD | Admitting: *Deleted

## 2015-07-14 ENCOUNTER — Encounter: Payer: Self-pay | Admitting: *Deleted

## 2015-07-14 ENCOUNTER — Ambulatory Visit: Payer: BLUE CROSS/BLUE SHIELD | Admitting: *Deleted

## 2015-07-14 DIAGNOSIS — F509 Eating disorder, unspecified: Secondary | ICD-10-CM | POA: Diagnosis not present

## 2015-07-14 NOTE — Progress Notes (Addendum)
Appointment start time: 1400  Appointment end time: 1445  Patient was seen on 07/14/15 for nutrition counseling pertaining to disordered eating.  She is accompanied by her mother  Primary care provider: Dr. Rana SnareLowe Therapist: Noni SaupeHeather Kitchen, weekly    Any other medical team members: Dr. Marina GoodellPerry Parents: Bonnita LevanJoy Thomas  Assessment:   Increased Prozac to 40 mg.  Also has sinus infection and taking Z-pac for 2 more days Cleaned out her closet of things that are too small  Was constipated for over a week.  Is now taking Miralax, but not regularly.   Has not been following meal plan guidelines.  Does not always get 5 eating occasions/day and almost never gets 2 foods with breakfast.  Is not able to verbalize why    Growth Metrics: Ideal BMI for age: 5721.6 BMI 07/08/15: 21.5 % Ideal:  99.5% Previous growth data: weight/age  61-90th%; height/age at 75%; BMI/age 81-85% Goal BMI range based on growth chart data: 22-25 Goal weight range based on growth chart data: 130-145 lb Goal rate of weight gain:  0.5 lb/week   Mental health diagnosis: AN, B/P subtype   Dietary assessment: mom says she's always been picky; currently following gluten-free diet  Safe foods include: salad with minimal dressing, fruits, oatmeal, rice chex Avoided foods include:desserts, meat, pasta, fast food, chips, pizza, bread, all other potatoes besides baked  Thursday B: Luna bar Coffee L:pimemto cheese and crackers S: Luna bar D: panera 1/2 salad and soup  Saturday B: 2 gluten free muffins Leftover lentil soup Cupcake 1/2 salad and soup Luna bar  Sunday Luna bar 1/2 chicken nachos Granola bar 2/3 chicken salad GF cupcake  Monday B: Luna bar Oatmeal with banana and pecans 1/2 salad with soup Coffee GF cupcake  Estimated energy intake: 1200 kcal  Estimated energy needs: 2200 kcal for weight restoration    275 g CHO 110 g pro 73 g fat  Nutrition Diagnosis: NI-1.4 Inadequate energy intake As  related to restricting and purging.  As evidenced by weight loss of 16 pounds .  Intervention/Goals: Nutrition counseling provided.  Shanikia is not following meal plan.  Per medical record, she endorses increased anxiety; does not have much insight into this and is not able to verbalize what is contributing to anxiety.  Advised her weight has not increased of late. She is incredibly constipation, which is most likely exacerbating her sense of fullness and "feeling fat".  Advised regular Miralax and adequate hydration (6 cups).  If still constipated at next visit with Dr. Marina GoodellPerry, can discuss high level of medication management Gave handout with antianxiety apps  Goals: Ensure 2 items with breakfast Challenge Ed thoughts Ensure adequate hydration Ensure 5 eating occasions daily (if not able to meet this goal, will continue weekly visits in January; if able to meet goal, will do every other week) Use anxiety apps Eat gluten-free pasta   Monitoring and Evaluation: Patient will follow up in 2 weeks

## 2015-07-15 ENCOUNTER — Emergency Department (HOSPITAL_COMMUNITY): Payer: BLUE CROSS/BLUE SHIELD

## 2015-07-15 ENCOUNTER — Encounter (HOSPITAL_COMMUNITY): Payer: Self-pay | Admitting: Emergency Medicine

## 2015-07-15 ENCOUNTER — Emergency Department (HOSPITAL_COMMUNITY)
Admission: EM | Admit: 2015-07-15 | Discharge: 2015-07-15 | Disposition: A | Discharge status: Home / Self Care | Payer: BLUE CROSS/BLUE SHIELD | Attending: Emergency Medicine | Admitting: Emergency Medicine

## 2015-07-15 DIAGNOSIS — S63501A Unspecified sprain of right wrist, initial encounter: Secondary | ICD-10-CM | POA: Insufficient documentation

## 2015-07-15 DIAGNOSIS — Y9317 Activity, water skiing and wake boarding: Secondary | ICD-10-CM | POA: Insufficient documentation

## 2015-07-15 DIAGNOSIS — W010XXA Fall on same level from slipping, tripping and stumbling without subsequent striking against object, initial encounter: Secondary | ICD-10-CM | POA: Diagnosis not present

## 2015-07-15 DIAGNOSIS — Y998 Other external cause status: Secondary | ICD-10-CM | POA: Diagnosis not present

## 2015-07-15 DIAGNOSIS — G43909 Migraine, unspecified, not intractable, without status migrainosus: Secondary | ICD-10-CM | POA: Diagnosis not present

## 2015-07-15 DIAGNOSIS — Y9289 Other specified places as the place of occurrence of the external cause: Secondary | ICD-10-CM | POA: Insufficient documentation

## 2015-07-15 DIAGNOSIS — Z7952 Long term (current) use of systemic steroids: Secondary | ICD-10-CM | POA: Insufficient documentation

## 2015-07-15 DIAGNOSIS — Z79899 Other long term (current) drug therapy: Secondary | ICD-10-CM | POA: Diagnosis not present

## 2015-07-15 DIAGNOSIS — Z880 Allergy status to penicillin: Secondary | ICD-10-CM | POA: Diagnosis not present

## 2015-07-15 DIAGNOSIS — S060X0A Concussion without loss of consciousness, initial encounter: Secondary | ICD-10-CM | POA: Insufficient documentation

## 2015-07-15 DIAGNOSIS — S0990XA Unspecified injury of head, initial encounter: Secondary | ICD-10-CM | POA: Diagnosis present

## 2015-07-15 LAB — I-STAT BETA HCG BLOOD, ED (MC, WL, AP ONLY): I-stat hCG, quantitative: <5 m[IU]/mL (ref <5)

## 2015-07-15 MED ORDER — BACITRACIN ZINC 500 UNIT/GM EX OINT
TOPICAL_OINTMENT | Freq: Once | CUTANEOUS | Status: AC
  Administered 2015-07-15: 12:00:00 via TOPICAL

## 2015-07-15 MED ORDER — SODIUM CHLORIDE 0.9 % IV BOLUS (SEPSIS)
1000.0000 mL | Freq: Once | INTRAVENOUS | Status: AC
  Administered 2015-07-15: 1000 mL via INTRAVENOUS

## 2015-07-15 NOTE — Discharge Instructions (Signed)
Do not participate in any sports or any activities that could result in head trauma until you are cleared by your pediatrician,  primary care physician or neurologist.   Rest, Ice intermittently (in the first 24-48 hours), Gentle compression with an Ace wrap, and elevate (Limb above the level of the heart)   Take up to  of ibuprofen (that is usually 4 over the counter pills)  3 times a day for 5 days. Take with food.  You were diagnosed with a right wrist sprain, a fracture of the scaphoid bone cannot be ruled out at this time. For this reason, please wear your splint at all times,  cover with a plastic bag when you bathe. You will need a repeat x-ray in 7-10 days to be sure this bone is not broken. Please follow with your primary doctor, or the orthopedist you have been referred to, or, you may return to the emergency room for the repeat x-ray.    Concussion, Adult A concussion, or closed-head injury, is a brain injury caused by a direct blow to the head or by a quick and sudden movement (jolt) of the head or neck. Concussions are usually not life-threatening. Even so, the effects of a concussion can be serious. If you have had a concussion before, you are more likely to experience concussion-like symptoms after a direct blow to the head.  CAUSES  Direct blow to the head, such as from running into another player during a soccer game, being hit in a fight, or hitting your head on a hard surface.  A jolt of the head or neck that causes the brain to move back and forth inside the skull, such as in a car crash. SIGNS AND SYMPTOMS The signs of a concussion can be hard to notice. Early on, they may be missed by you, family members, and health care providers. You may look fine but act or feel differently. Symptoms are usually temporary, but they may last for days, weeks, or even longer. Some symptoms may appear right away while others may not show up for hours or days. Every head injury is  different. Symptoms include:  Mild to moderate headaches that will not go away.  A feeling of pressure inside your head.  Having more trouble than usual:  Learning or remembering things you have heard.  Answering questions.  Paying attention or concentrating.  Organizing daily tasks.  Making decisions and solving problems.  Slowness in thinking, acting or reacting, speaking, or reading.  Getting lost or being easily confused.  Feeling tired all the time or lacking energy (fatigued).  Feeling drowsy.  Sleep disturbances.  Sleeping more than usual.  Sleeping less than usual.  Trouble falling asleep.  Trouble sleeping (insomnia).  Loss of balance or feeling lightheaded or dizzy.  Nausea or vomiting.  Numbness or tingling.  Increased sensitivity to:  Sounds.  Lights.  Distractions.  Vision problems or eyes that tire easily.  Diminished sense of taste or smell.  Ringing in the ears.  Mood changes such as feeling sad or anxious.  Becoming easily irritated or angry for little or no reason.  Lack of motivation.  Seeing or hearing things other people do not see or hear (hallucinations). DIAGNOSIS Your health care provider can usually diagnose a concussion based on a description of your injury and symptoms. He or she will ask whether you passed out (lost consciousness) and whether you are having trouble remembering events that happened right before and during your injury. Your  evaluation might include:  A brain scan to look for signs of injury to the brain. Even if the test shows no injury, you may still have a concussion.  Blood tests to be sure other problems are not present. TREATMENT  Concussions are usually treated in an emergency department, in urgent care, or at a clinic. You may need to stay in the hospital overnight for further treatment.  Tell your health care provider if you are taking any medicines, including prescription medicines,  over-the-counter medicines, and natural remedies. Some medicines, such as blood thinners (anticoagulants) and aspirin, may increase the chance of complications. Also tell your health care provider whether you have had alcohol or are taking illegal drugs. This information may affect treatment.  Your health care provider will send you home with important instructions to follow.  How fast you will recover from a concussion depends on many factors. These factors include how severe your concussion is, what part of your brain was injured, your age, and how healthy you were before the concussion.  Most people with mild injuries recover fully. Recovery can take time. In general, recovery is slower in older persons. Also, persons who have had a concussion in the past or have other medical problems may find that it takes longer to recover from their current injury. HOME CARE INSTRUCTIONS General Instructions  Carefully follow the directions your health care provider gave you.  Only take over-the-counter or prescription medicines for pain, discomfort, or fever as directed by your health care provider.  Take only those medicines that your health care provider has approved.  Do not drink alcohol until your health care provider says you are well enough to do so. Alcohol and certain other drugs may slow your recovery and can put you at risk of further injury.  If it is harder than usual to remember things, write them down.  If you are easily distracted, try to do one thing at a time. For example, do not try to watch TV while fixing dinner.  Talk with family members or close friends when making important decisions.  Keep all follow-up appointments. Repeated evaluation of your symptoms is recommended for your recovery.  Watch your symptoms and tell others to do the same. Complications sometimes occur after a concussion. Older adults with a brain injury may have a higher risk of serious complications, such  as a blood clot on the brain.  Tell your teachers, school nurse, school counselor, coach, athletic trainer, or work Production designer, theatre/television/filmmanager about your injury, symptoms, and restrictions. Tell them about what you can or cannot do. They should watch for:  Increased problems with attention or concentration.  Increased difficulty remembering or learning new information.  Increased time needed to complete tasks or assignments.  Increased irritability or decreased ability to cope with stress.  Increased symptoms.  Rest. Rest helps the brain to heal. Make sure you:  Get plenty of sleep at night. Avoid staying up late at night.  Keep the same bedtime hours on weekends and weekdays.  Rest during the day. Take daytime naps or rest breaks when you feel tired.  Limit activities that require a lot of thought or concentration. These include:  Doing homework or job-related work.  Watching TV.  Working on the computer.  Avoid any situation where there is potential for another head injury (football, hockey, soccer, basketball, martial arts, downhill snow sports and horseback riding). Your condition will get worse every time you experience a concussion. You should avoid these activities until  you are evaluated by the appropriate follow-up health care providers. Returning To Your Regular Activities You will need to return to your normal activities slowly, not all at once. You must give your body and brain enough time for recovery.  Do not return to sports or other athletic activities until your health care provider tells you it is safe to do so.  Ask your health care provider when you can drive, ride a bicycle, or operate heavy machinery. Your ability to react may be slower after a brain injury. Never do these activities if you are dizzy.  Ask your health care provider about when you can return to work or school. Preventing Another Concussion It is very important to avoid another brain injury, especially before  you have recovered. In rare cases, another injury can lead to permanent brain damage, brain swelling, or death. The risk of this is greatest during the first 7-10 days after a head injury. Avoid injuries by:  Wearing a seat belt when riding in a car.  Drinking alcohol only in moderation.  Wearing a helmet when biking, skiing, skateboarding, skating, or doing similar activities.  Avoiding activities that could lead to a second concussion, such as contact or recreational sports, until your health care provider says it is okay.  Taking safety measures in your home.  Remove clutter and tripping hazards from floors and stairways.  Use grab bars in bathrooms and handrails by stairs.  Place non-slip mats on floors and in bathtubs.  Improve lighting in dim areas. SEEK MEDICAL CARE IF:  You have increased problems paying attention or concentrating.  You have increased difficulty remembering or learning new information.  You need more time to complete tasks or assignments than before.  You have increased irritability or decreased ability to cope with stress.  You have more symptoms than before. Seek medical care if you have any of the following symptoms for more than 2 weeks after your injury:  Lasting (chronic) headaches.  Dizziness or balance problems.  Nausea.  Vision problems.  Increased sensitivity to noise or light.  Depression or mood swings.  Anxiety or irritability.  Memory problems.  Difficulty concentrating or paying attention.  Sleep problems.  Feeling tired all the time. SEEK IMMEDIATE MEDICAL CARE IF:  You have severe or worsening headaches. These may be a sign of a blood clot in the brain.  You have weakness (even if only in one hand, leg, or part of the face).  You have numbness.  You have decreased coordination.  You vomit repeatedly.  You have increased sleepiness.  One pupil is larger than the other.  You have convulsions.  You have  slurred speech.  You have increased confusion. This may be a sign of a blood clot in the brain.  You have increased restlessness, agitation, or irritability.  You are unable to recognize people or places.  You have neck pain.  It is difficult to wake you up.  You have unusual behavior changes.  You lose consciousness. MAKE SURE YOU:  Understand these instructions.  Will watch your condition.  Will get help right away if you are not doing well or get worse.   This information is not intended to replace advice given to you by your health care provider. Make sure you discuss any questions you have with your health care provider.   Document Released: 09/30/2003 Document Revised: 07/31/2014 Document Reviewed: 01/30/2013 Elsevier Interactive Patient Education [employer]

## 2015-07-15 NOTE — ED Notes (Signed)
Per pt, hx of POTS, remembers walking out the mailbox and then waking up on the concrete outside. Pt unsure if she passed out or tripped. Pt endorses hitting her head and R elbow. Abrasion to R elbow. Pt is AAOX4 in NAD. Pt c/o headache. Pain 5/10.

## 2015-07-15 NOTE — ED Provider Notes (Signed)
CSN: [id number]     Arrival date & time 07/15/15  1107 History   First MD Initiated Contact with Patient 07/15/15 1121     Chief Complaint  Patient presents with  . Fall     (Consider location/radiation/quality/duration/timing/severity/associated sxs/prior Treatment) HPI  Blood pressure 102/70, pulse 83, temperature 98.8 F (37.1 C), resp. rate 20, last menstrual period 06/29/2015, SpO2 100 %.  Jessica [name] is a 19 y.o. female with past medical history significant for POTS complaining of fall with questionable syncope just prior to arrival. Patient remembers walking out to the mailbox, she thinks she may have tripped, she fell backwards hitting the right occipital part of her head on the concrete sidewalk. She is unclear if there was loss of consciousness. As per mother, patient states that her last episode of syncope was in October, states that when she syncopized as she normally face plants. Patient was given Motrin prior to arrival, she rates her pain at 6 out of 10, 10 the elbow. She endorses increased pain with movement of right hand and elbow. She is right-hand dominant. She also has a diffuse headache. Denies cervicalgia, change in vision, N/V, numbness, weakness, dysarthria, ataxia.   Past Medical History  Diagnosis Date  . POTS (postural orthostatic tachycardia syndrome)   . Migraine headache   . Syncope 09/22/2014   Past Surgical History  Procedure Laterality Date  . Eye surgery  2000    clogged tear duct  . Anterior cruciate ligament repair  03/2010  . Wisdom tooth extraction     Family History  Problem Relation Age of Onset  . Migraines Mother     Started 4th or 5th grade  . Seizures Mother     Febrile Seizures as a child  . Seizures Father     Febrile Seizures as a child  . Migraines Maternal Aunt   . Migraines Maternal Uncle   . Mental retardation Other     Maternal Second Cousin  . Other Other     Maternal Great Uncle had some sort of Retinal Vessel Occlusion   . Stroke Maternal Grandfather     mini-stroke  . Cancer Maternal Grandfather     Bladder cancer, Died at 18  . Diabetes Maternal Grandmother   . Diabetes Maternal Grandfather   . Hypertension Father   . Kidney Stones Father   . Breast cancer Maternal Aunt   . Cancer Maternal Aunt     Peritoneal CA  . Eating disorder Maternal Aunt   . Depression      Father's side of the family   Social History  Substance Use Topics  . Smoking status: Never Smoker   . Smokeless tobacco: Never Used  . Alcohol Use: No   OB History    No data available     Review of Systems  10 systems reviewed and found to be negative, except as noted in the HPI.   Allergies  Amoxicillin  Home Medications   Prior to Admission medications   Medication Sig Start Date End Date Taking? Authorizing Provider  atenolol (TENORMIN) 25 MG tablet 1 tablet in the morning 10/04/12   Historical Provider, MD  azithromycin (ZITHROMAX) 1 G powder Take 1 g by mouth once.    Historical Provider, MD  Calcium-Vitamin D-Vitamin K (VIACTIV PO) Take by mouth.    Historical Provider, MD  fludrocortisone (FLORINEF) 0.1 MG tablet Take 1 tablet by mouth daily 04/13/15   Owens Shark, MD  FLUoxetine (PROZAC) 40 MG capsule  Take 1 capsule (40 mg total) by mouth daily. 07/08/15   Owens Shark, MD  ibuprofen (ADVIL,MOTRIN) 200 MG tablet Take 200 mg by mouth every 6 (six) hours as needed.    Historical Provider, MD  Multiple Vitamins-Minerals (MULTIVITAMIN ADULT PO) Take by mouth.    Historical Provider, MD  norethindrone-ethinyl estradiol-iron (JUNEL FE 1.5/30) 1.5-30 MG-MCG tablet Take 1 tablet by mouth daily. 04/13/15   Owens Shark, MD  OLANZapine (ZYPREXA) 2.5 MG tablet Take 0.5 tablets (1.25 mg total) by mouth at bedtime. 05/14/15   Owens Shark, MD  polyethylene glycol [facility] / Ethelene Hal) packet Take 17 g by mouth daily.    Historical Provider, MD  Probiotic Product (PHILLIPS COLON HEALTH PO) Take by mouth.    Historical  Provider, MD  ranitidine (ZANTAC) 150 MG tablet TAKE 1 TABLET BY MOUTH 2 TIMES DAILY. 07/08/15   Christianne Dolin, NP  SUMAtriptan (IMITREX) 25 MG tablet Take 25 mg by mouth every 2 (two) hours as needed for migraine. May repeat in 2 hours if headache persists or recurs.    Historical Provider, MD  Topiramate ER 150 MG CS24  05/17/15   Historical Provider, MD   BP 96/63 mmHg  Pulse 74  Temp(Src) 98.8 F (37.1 C)  Resp 16  SpO2 100%  LMP 06/29/2015 Physical Exam  Constitutional: She is oriented to person, place, and time. She appears well-developed and well-nourished.  HENT:  Head: Normocephalic and atraumatic.    Mouth/Throat: Oropharynx is clear and moist.  No abrasions or contusions.   No hemotympanum, battle signs or raccoon's eyes  No crepitance or tenderness to palpation along the orbital rim.  EOMI intact with no pain or diplopia  No abnormal otorrhea or rhinorrhea. Nasal septum midline.  No intraoral trauma.  Eyes: Conjunctivae and EOM are normal. Pupils are equal, round, and reactive to light.  Neck: Normal range of motion. Neck supple.  No midline C-spine  tenderness to palpation or step-offs appreciated. Patient has full range of motion without pain.  Grip/Biceps/Tricep strength 5/5 bilaterally, sensation to UE intact bilaterally.    Cardiovascular: Normal rate, regular rhythm and intact distal pulses.   Pulmonary/Chest: Effort normal and breath sounds normal. No respiratory distress. She has no wheezes. She has no rales. She exhibits no tenderness.  No TTP or crepitance  Abdominal: Soft. Bowel sounds are normal. She exhibits no distension and no mass. There is no tenderness. There is no rebound and no guarding.  Musculoskeletal: Normal range of motion. She exhibits tenderness. She exhibits no edema.  Good range of motion to right shoulder, reduced range of motion in right elbow extension, she tended to palpation along the right snuffbox, she is distally  neurovascularly intact, there is a partial thickness abrasion to elbow measuring 1 x 7 cm.  Neurological: She is alert and oriented to person, place, and time.  Follows commands, Clear, goal oriented speech, Strength is 5 out of 5x4 extremities, patient ambulates with a coordinated in nonantalgic gait. Sensation is grossly intact.   Skin: Skin is warm.  Psychiatric: She has a normal mood and affect.  Nursing note and vitals reviewed.   ED Course  Procedures (including critical care time) Labs Review Labs Reviewed  I-STAT BETA HCG BLOOD, ED ([facility], [facility], [facility] ONLY)    Imaging Review Dg Elbow Complete Right  07/15/2015  CLINICAL DATA:  Status post fall.  Right elbow pain. EXAM: RIGHT ELBOW - COMPLETE 3+ VIEW COMPARISON:  None. FINDINGS: There is no evidence of  fracture, dislocation, or joint effusion. There is no evidence of arthropathy or other focal bone abnormality. Soft tissues are unremarkable. IMPRESSION: Negative. Electronically Signed   By: Elige KoHetal  Patel   On: 07/15/2015 12:45   Dg Wrist Complete Right  07/15/2015  CLINICAL DATA:  Fall. EXAM: RIGHT WRIST - COMPLETE 3+ VIEW COMPARISON:  None. FINDINGS: There is no evidence of fracture or dislocation. There is no evidence of arthropathy or other focal bone abnormality. Soft tissues are unremarkable. IMPRESSION: Negative. Electronically Signed   By: Signa Kellaylor  Stroud M.D.   On: 07/15/2015 12:45   I have personally reviewed and evaluated these images and lab results as part of my medical decision-making.   EKG Interpretation   Date/Time:  Thursday July 15 2015 11:31:55 EST Ventricular Rate:  86 PR Interval:  140 QRS Duration: 84 QT Interval:  384 QTC Calculation: 459 R Axis:   79 Text Interpretation:  Sinus rhythm No previous ECGs available Confirmed by  ZACKOWSKI  MD, SCOTT [phone]([id number]) on 07/15/2015 12:46:18 PM      MDM   Final diagnoses:  Concussion, without loss of consciousness, initial encounter  Right wrist sprain, initial  encounter    Filed Vitals:   07/15/15 1145 07/15/15 1200 07/15/15 1215 07/15/15 1328  BP: 105/70 106/71 96/71 96/63   Pulse: 83 87 73 74  Temp:      Resp: 15 20 14 16   SpO2: 100% 100% 100% 100%    Medications  sodium chloride 0.9 % bolus 1,000 mL (0 mLs Intravenous Stopped 07/15/15 1245)  bacitracin ointment ( Topical Given 07/15/15 1221)    Marland KitchenCiara J Thomas is 19 y.o. female presenting with fall versus syncope, with occipital head trauma. Neuro exam is nonfocal, no indication for imaging based on [place] head CT rules and neck cysts C-spine criteria. EKG with no arrhythmia, normal intervals. Patient does have pain to the right elbow and wrist. Imaging is negative however she has snuffbox tenderness, we'll treat for presumed scaphoid fracture, recommend repeat x-ray in 1 week, patient also given concussion precautions. She follows with Dr. Sharene SkeansHickling for pots. Will try to check in for clearance [place].  Evaluation does not show pathology that would require ongoing emergent intervention or inpatient treatment. Pt is hemodynamically stable and mentating appropriately. Discussed findings and plan with patient/guardian, who agrees with care plan. All questions answered. Return precautions discussed and outpatient follow up given.       Wynetta Emeryicole Saadiya Wilfong, PA-C 07/15/15 1334  Vanetta MuldersScott Zackowski, MD 07/17/15 [phone]

## 2015-07-15 NOTE — ED Notes (Signed)
Pt verbalized understanding of d/c instructions and follow-up care. No further questions/concerns, VSS, ambulatory w/ steady gait (refused wheelchair) 

## 2015-07-23 ENCOUNTER — Ambulatory Visit (INDEPENDENT_AMBULATORY_CARE_PROVIDER_SITE_OTHER): Payer: BLUE CROSS/BLUE SHIELD | Admitting: Family

## 2015-07-23 ENCOUNTER — Encounter: Payer: Self-pay | Admitting: Family

## 2015-07-23 VITALS — BP 84/60 | HR 82 | Ht 65.5 in | Wt 133.0 lb

## 2015-07-23 DIAGNOSIS — G90A Postural orthostatic tachycardia syndrome (POTS): Secondary | ICD-10-CM

## 2015-07-23 DIAGNOSIS — G44219 Episodic tension-type headache, not intractable: Secondary | ICD-10-CM | POA: Diagnosis not present

## 2015-07-23 DIAGNOSIS — S060X1A Concussion with loss of consciousness of 30 minutes or less, initial encounter: Secondary | ICD-10-CM

## 2015-07-23 DIAGNOSIS — G43009 Migraine without aura, not intractable, without status migrainosus: Secondary | ICD-10-CM

## 2015-07-23 DIAGNOSIS — I951 Orthostatic hypotension: Secondary | ICD-10-CM

## 2015-07-23 DIAGNOSIS — R000 Tachycardia, unspecified: Secondary | ICD-10-CM

## 2015-07-23 DIAGNOSIS — S060X9A Concussion with loss of consciousness of unspecified duration, initial encounter: Secondary | ICD-10-CM | POA: Insufficient documentation

## 2015-07-23 MED ORDER — TROKENDI XR 200 MG PO CP24: ORAL_CAPSULE | ORAL | Status: DC

## 2015-07-23 MED ORDER — TROKENDI XR 50 MG PO CP24: ORAL_CAPSULE | ORAL | Status: DC

## 2015-07-23 NOTE — Progress Notes (Signed)
Patient: Jessica [name] MRN: [id number] Sex: female DOB: 04-03-1996  Provider: Elveria Risingina Raeden Schippers, NP Location of Care: [facility]  Note type: Routine return visit  History of Present Illness: Referral Source: [facility] MastMelissa Lowe, MD History from: mother, patient and [facility] chart Chief Complaint: Hospital F/U- Fall with head injury  Jessica [name] is a 19 y.o. young woman with history of migraines, episodes of orthostatic hypotension, palpitations, syncope, anorexia nervosa. She was last seen by Dr Sharene SkeansHickling on May 05, 2015. [name] has migraine without aura and neurally mediated syncope. She has vasovagal syncope with orthostatic intolerance that is similar to POTS. This has been treated with a combination of hydration, exercise, elevation of the head of her bed, atenolol given twice a day, midodrine every four hours during the daytime as needed for fatigue and Florinef. She has been taking Topiramate ER 150mg  for migraine prophylaxis, and was doing well until she fell and hit her head on July 15, 2015.   On that day, Hanadi said that she was exiting her garage, when she tripped and fell, striking the back of her head and injuring her right arm. She said that she lost consciousness when she hit her head but is unsure how for what length of time. Mom took her to be evaluated in the ER, where she was told that she had a concussion, a large abrasion to her right elbow and a possible right wrist fracture. [name] says that since the head injury she has had a severe headache with nausea. She has been taking Ibuprofen without relief. She has found that lights and noise worsens the headache. [name] and her family went to [place] on December 26th for a few days, and says that she had to curtail her activities there because of headache.   Mom noted that [name] has some word finding and slower processing since the head injury. [name] went to an orthopedic follow up today and was told that her  wrist may have a stress fracture, and to remove the splint and sling that she had been wearing and allow her arm to do usual range of motion. [name] complains of pain with movements of her right wrist and elbow.   [name] and her mother said that prior to this event, she had been doing well in terms of POTS symptoms. She is a Consulting civil engineerstudent at [employer] and said that she made straight A's in fall semester. [name] says that she hasn't been drinking as much water as she should be recently. She sleeps 6-8 hours at night and sometimes naps during the day. She denies skipping meals.   Neither [name] nor her mother have other health concerns for her today other than previously mentioned.  Review of Systems: Please see the HPI for neurologic and other pertinent review of systems. Otherwise, the following systems are noncontributory including constitutional, eyes, ears, nose and throat, cardiovascular, respiratory, gastrointestinal, genitourinary, musculoskeletal, skin, endocrine, hematologic/lymph, allergic/immunologic and psychiatric.   Past Medical History  Diagnosis Date  . POTS (postural orthostatic tachycardia syndrome)   . Migraine headache   . Syncope 09/22/2014   Hospitalizations: Yes.  , Head Injury: Yes.  , Nervous System Infections: No., Immunizations up to date: Yes.   Past Medical History Comments: She had onset of headaches, some of them migraines beginning in the 4th or 5th grade. These involve stabbing left-sided pain that lasts for an hour before easing. The episodes are recurrent and are associated with nausea and vomiting, left sided throbbing,  no sensitivity to light, sound, or movement. The patient recently had at least one episode of 10-15 minutes of tingling in her face and fingers associated with headaches. She also had episodes of what appeared to be positional vertigo, but became syncope. She was evaluated by Dr. Darlis Loan on April 08, 2012. He noted a previous evaluation, June 22, 2011 for  syncope. EKG and echocardiogram were normal. 24-hour Holter monitor was normal. She had persistent episodes of tachycardia and dizziness following the evaluation. She has had initial improvement with Florinef; however, on 0.3 mg per day, she did not have persistent improvement. She complained of three episodes of loss of consciousness per month. On March 04, 2012, she had an episode of stiffening which was unusual. A decision was made to place her on atenolol when her pulse went up from 68 to 105 from supine to standing.  Surgical History Past Surgical History  Procedure Laterality Date  . Eye surgery  2000    clogged tear duct  . Anterior cruciate ligament repair  03/2010  . Wisdom tooth extraction      Family History family history includes Breast cancer in her maternal aunt; Cancer in her maternal aunt and maternal grandfather; Diabetes in her maternal grandfather and maternal grandmother; Eating disorder in her maternal aunt; Hypertension in her father; Kidney Stones in her father; Mental retardation in her other; Migraines in her maternal aunt, maternal uncle, and mother; Other in her other; Seizures in her father and mother; Stroke in her maternal grandfather. Family History is otherwise negative for migraines, seizures, cognitive impairment, blindness, deafness, birth defects, chromosomal disorder, autism.  Social History Social History   Social History  . Marital Status: Single    Spouse Name: N/A  . Number of Children: N/A  . Years of Education: N/A   Social History Main Topics  . Smoking status: Never Smoker   . Smokeless tobacco: Never Used  . Alcohol Use: No  . Drug Use: No  . Sexual Activity: No   Other Topics Concern  . 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 She lives with her parents and siblings. She enjoys school, shopping,watching TV, drinking decaf coffee and helping with children at church.      Allergies Allergies  Allergen Reactions  . Amoxicillin Rash    Physical Exam BP 84/60 mmHg  Pulse 82  Ht 5' 5.5" (1.664 m)  Wt 133 lb (60.328 kg)  BMI 21.79 kg/m2  LMP 06/29/2015 General: alert, well developed, well nourished girl, in no acute distress, right-handed, brown hair, brown eyes  Head: normocephalic, no dysmorphic features; no localized tenderness  Ears, Nose and Throat: Otoscopic: tympanic membranes normal . Pharynx: oropharynx is pink without exudates or tonsillar hypertrophy.  Neck: supple, full range of motion, no cranial or cervical bruits  Respiratory: auscultation clear  Cardiovascular: no murmurs, pulses are normal  Musculoskeletal: no skeletal deformities or apparent scoliosis. She complains of pain with any movement of her right elbow and wrist.  Skin: no rashes or neurocutaneous lesions. She has a bandage on a large abrasion on her right elbow.  Neurologic Exam  Mental Status: alert; oriented to person, place, and year; knowledge is normal for age; language is normal  Cranial Nerves: visual fields are full to double simultaneous stimuli; extraocular movements are full and conjugate; pupils are round reactive to light; funduscopic examination shows sharp disc margins with normal vessels; symmetric facial strength; midline tongue  and uvula; hearing is normal and symmetric  Motor: Normal strength, tone, and mass; good fine motor movements; no pronator drift.  Sensory: intact responses to touch and temperature  Coordination: good finger-to-nose, rapid repetitive alternating movements and finger apposition  Gait and Station: normal gait and station; patient is able to walk on heels, toes and tandem without difficulty; balance is adequate; Romberg exam is negative; Gower response is negative  Reflexes: symmetric and diminished bilaterally; no clonus; bilateral flexor plantar responses   Impression 1. Concussion with loss of consciousness 2.  Post-concussion headache 3. Migraine with aura, without status migranosus, not intractable 4. Postural orthostatic tachycardia syndrome 5. History of episodic tension type headaches 6. History of syncope 7. Anorexia nervosa   Recommendations for plan of care The patient's previous [facility] records were reviewed. Anandi has neither had nor required imaging or lab studies since the last visit, other than a pregnancy test, and EKG and x-rays that were performed when she was seen at the ER on July 15, 2015. She is a 19 year old young woman history of migraines, episodes of orthostatic hypotension, palpitations, syncope, anorexia nervosa and recent closed head injury. Maigen tripped and fell at her home on December 22nd, striking the occiptal region of her head and injuring her right arm. She had loss of consciousness when she hit her head, estimated to be 1-2 minutes. Since then, she has had severe headache with nausea, as well as some trouble with speech and processing. I talked with Maygen and her mother about closed head injuries and explained that while she will improve, the speed of recovery varies from person to person. I told her that she can temporarily increase the Topiramate ER, recommended rest, daily gentle exercise, limited screen time and increased hydration. Tavonna is on break from school until January 17th, and it is my hope that she will be fully recovered by that time. If not, I will be happy to write a letter to her school to request accommodations. I gave her samples of Trokendi XR to increase the Topiramate ER dose from  to , and told her that we will lower the dose back to  when her headache resolves.   Ranika's blood pressure was low today at 84/60. She admitted that she had not been drinking as much water as usual. I talked with her about this, and recommended that she increase her fluid intake as well as drink at least 20 oz of a sugar free sports drink daily. This will help  not only with stabilizing her blood pressure but with her headache as well.   I asked Lavilla to keep a headache diary and to return for follow up of the concussion in 4 weeks or sooner if needed. Britny and her mother agreed with the plans made today.   The medication list was reviewed and reconciled.  I reviewed changes that were made in the prescribed medications today.  A complete medication list was provided to the patient.  Dr. Sharene Skeans was consulted regarding the patient.   Total time spent with the patient was 40 minutes, of which 50% or more was spent in counseling and coordination of care.

## 2015-07-23 NOTE — Patient Instructions (Signed)
With your recent fall, you have suffered what is known as a closed head injury, or concussion. After a concussion, the headache may be severe and may last for days or weeks. We can try to increase the Topiramate ER that you are taking to see if that will help with the severity of the headache. I have given you some samples of Trokendi XR 50mg  and Trokendi XR 200mg  - which is a brand name version of Topiramate ER. Increase your current dose to 200mg  per day. Keep track of your headaches, so we can tell if you are getting benefit from the increased dose.   Your blood pressure is low today at 84/60. You need to increase your fluid intake to help get your blood pressure to a more normal level but also because increasing your fluids can help to treat the headache. You should be getting in at least 60 oz of water per day. I would also recommend that at least 20 ounces of your fluid intake per day is a sugar free sports drink such as Gatorade. After your headache resolves, you may be able to stop drinking the sports drink and just drink water, but will need to monitor your blood pressure closely.  Other things that you can do for your headache will be to get extra rest - perhaps even napping once during the middle of the day. Gentle exercise is also helpful - doing things such as walking, stretching, yoga, or other non-strenuous activities for 30 minutes per day.   Some people with a concussion have trouble thinking, or sometimes feel unusually irritable. These things will improve as the concussion resolves. For now, try to limit things with brightly lit back grounds such as reading on a tablet; playing games on a phone, tablet or computer; watching movies or TV. You can read books if it does not worsen your headache. In general, limit all these activities to less than 30 minutes at a time, and stop if the headache worsens.   I need to see you in 4 weeks, to see how you are doing. Please call sooner if your  headache worsens, or if you have any other concerns.

## 2015-07-27 ENCOUNTER — Emergency Department (HOSPITAL_COMMUNITY)
Admission: EM | Admit: 2015-07-27 | Discharge: 2015-07-28 | Disposition: A | Discharge status: Home / Self Care | Payer: BLUE CROSS/BLUE SHIELD | Attending: Emergency Medicine | Admitting: Emergency Medicine

## 2015-07-27 ENCOUNTER — Encounter: Payer: Self-pay | Admitting: Family

## 2015-07-27 ENCOUNTER — Encounter (HOSPITAL_COMMUNITY): Payer: Self-pay | Admitting: Emergency Medicine

## 2015-07-27 ENCOUNTER — Ambulatory Visit (INDEPENDENT_AMBULATORY_CARE_PROVIDER_SITE_OTHER): Payer: BLUE CROSS/BLUE SHIELD | Admitting: Family

## 2015-07-27 VITALS — BP 97/64 | HR 78 | Ht 65.35 in | Wt 133.2 lb

## 2015-07-27 DIAGNOSIS — W228XXA Striking against or struck by other objects, initial encounter: Secondary | ICD-10-CM | POA: Insufficient documentation

## 2015-07-27 DIAGNOSIS — Y998 Other external cause status: Secondary | ICD-10-CM | POA: Diagnosis not present

## 2015-07-27 DIAGNOSIS — Z79899 Other long term (current) drug therapy: Secondary | ICD-10-CM | POA: Diagnosis not present

## 2015-07-27 DIAGNOSIS — Y9389 Activity, other specified: Secondary | ICD-10-CM | POA: Diagnosis not present

## 2015-07-27 DIAGNOSIS — R109 Unspecified abdominal pain: Secondary | ICD-10-CM | POA: Diagnosis not present

## 2015-07-27 DIAGNOSIS — G43909 Migraine, unspecified, not intractable, without status migrainosus: Secondary | ICD-10-CM | POA: Insufficient documentation

## 2015-07-27 DIAGNOSIS — Y9289 Other specified places as the place of occurrence of the external cause: Secondary | ICD-10-CM | POA: Insufficient documentation

## 2015-07-27 DIAGNOSIS — R55 Syncope and collapse: Secondary | ICD-10-CM | POA: Diagnosis not present

## 2015-07-27 DIAGNOSIS — R197 Diarrhea, unspecified: Secondary | ICD-10-CM | POA: Diagnosis not present

## 2015-07-27 DIAGNOSIS — S0990XA Unspecified injury of head, initial encounter: Secondary | ICD-10-CM | POA: Insufficient documentation

## 2015-07-27 DIAGNOSIS — Z1389 Encounter for screening for other disorder: Secondary | ICD-10-CM | POA: Diagnosis not present

## 2015-07-27 DIAGNOSIS — I951 Orthostatic hypotension: Secondary | ICD-10-CM

## 2015-07-27 DIAGNOSIS — F5000 Anorexia nervosa, unspecified: Secondary | ICD-10-CM

## 2015-07-27 DIAGNOSIS — R000 Tachycardia, unspecified: Secondary | ICD-10-CM

## 2015-07-27 DIAGNOSIS — G90A Postural orthostatic tachycardia syndrome (POTS): Secondary | ICD-10-CM

## 2015-07-27 DIAGNOSIS — Z880 Allergy status to penicillin: Secondary | ICD-10-CM | POA: Insufficient documentation

## 2015-07-27 LAB — POCT URINALYSIS DIPSTICK
Bilirubin, UA: NEGATIVE
Blood, UA: NEGATIVE
Glucose, UA: 50
KETONES UA: NEGATIVE
NITRITE UA: NEGATIVE
PH UA: 8
Spec Grav, UA: <=1.005
UROBILINOGEN UA: NEGATIVE

## 2015-07-27 MED ORDER — ATENOLOL 25 MG PO TABS: 12.5000 mg | ORAL_TABLET | Freq: Every day | ORAL | Status: DC

## 2015-07-27 NOTE — Progress Notes (Signed)
Patient ID: Marland KitchenCiara J Thomas, female   DOB: [birth year], 20 y.o.   MRN: [id number] Pre-Visit Planning  Marland KitchenCiara J Thomas  is a 20 y.o. female referred by Norman ClayLOWE,[name] V, MD.   [facility] on 07/08/15 for AN, anxiety, abdominal pain.   Previous Psych Screenings? yes  Treatment plan at last visit included increased Prozac dosage, increase water intake, consider Recovery app, Zantac for reflux, EATS-26 was reviewed and it was improved from initial presentation.  Of note, she was seen on 07/23/15 by Goodpasture, NP (Neuro) and was diagnosed with a concussion following a fall on 07/15/15.   Clinical Staff Visit Tasks:   - Urine GC/CT due? no - Psych Screenings Due? no - DE w/o EVS   Provider Visit Tasks: - assess DE, mood, anxiety - headaches/water consumption?  - [facility] Involvement? No - Pertinent Labs? no

## 2015-07-27 NOTE — Progress Notes (Signed)
THIS RECORD MAY CONTAIN CONFIDENTIAL INFORMATION THAT SHOULD NOT BE RELEASED WITHOUT REVIEW OF THE SERVICE PROVIDER.  Adolescent Medicine Consultation Follow-Up Visit Jessica KitchenCiara J [name]  is a 20 y.o. female referred by [name], [name], MD here today for follow-up of DE.    Growth Chart Viewed? not applicable   History was provided by the patient and mother.  PCP Confirmed?  yes  My Chart Activated?   yes   HPI:   Jessica [name] recently caught her foot on stairs and fell hitting her head, diagnosed with concussion and seen in follow up by neurology. Topamax was increased at that visit and she reports continued intermittent headache responsive to advil and general fatigue which was also worsened by vacation to [place] last week. She has found it difficult to eat a regular diet and drink the recommended quantity of fluids daily. She experiences general abdominal discomfort and nausea without emesis. Acid reflux symptoms are improved with zantac, has ongoing constipation.   Patient's last menstrual period was 06/29/2015. Allergies  Allergen Reactions  . Amoxicillin Rash   Current Outpatient Prescriptions on File Prior to Visit  Medication Sig Dispense Refill  . atenolol (TENORMIN) 25 MG tablet 1 tablet in the morning    . azithromycin (ZITHROMAX) 1 G powder Take 1 g by mouth once. Reported on 07/23/2015    . Calcium-Vitamin D-Vitamin K (VIACTIV PO) Take by mouth.    . fludrocortisone (FLORINEF) 0.1 MG tablet Take 1 tablet by mouth daily 90 tablet 5  . FLUoxetine (PROZAC) 40 MG capsule Take 1 capsule (40 mg total) by mouth daily. 30 capsule 2  . ibuprofen (ADVIL,MOTRIN) 200 MG tablet Take 200 mg by mouth every 6 (six) hours as needed.    . Multiple Vitamins-Minerals (MULTIVITAMIN ADULT PO) Take by mouth.    . norethindrone-ethinyl estradiol-iron (JUNEL FE 1.5/30) 1.5-30 MG-MCG tablet Take 1 tablet by mouth daily. 1 Package 11  . OLANZapine (ZYPREXA) 2.5 MG tablet Take 0.5 tablets (1.25 mg total) by  mouth at bedtime. 30 tablet 0  . polyethylene glycol (MIRALAX / GLYCOLAX) packet Take 17 g by mouth daily.    . Probiotic Product (PHILLIPS COLON HEALTH PO) Take by mouth.    . ranitidine (ZANTAC) 150 MG tablet TAKE 1 TABLET BY MOUTH 2 TIMES DAILY. 30 tablet 0  . SUMAtriptan (IMITREX) 25 MG tablet Take 25 mg by mouth every 2 (two) hours as needed for migraine. May repeat in 2 hours if headache persists or recurs.    . Topiramate ER 150 MG CS24 200 mg.   5  . TROKENDI XR 200 MG CP24 Take 1 capsule daily 14 capsule 0  . TROKENDI XR 50 MG CP24 Take 1 capsule daily along with your Topiramate ER 150mg  to equal 200mg  per day 14 capsule 0   No current facility-administered medications on file prior to visit.     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 She lives with her parents and siblings. She enjoys school, shopping,watching TV, drinking decaf coffee and helping with children at church.      The following portions of the patient's history were reviewed and updated as appropriate: allergies, current medications, past family history, past medical history, past social history, past surgical history and problem list.  Physical Exam:  Filed Vitals:   07/27/15 1016  BP: 97/64  Pulse: 78  Height: 5' 5.35" (1.66 m)  Weight: 133 lb 2.5 oz (60.4 kg)  BP 97/64 mmHg  Pulse 78  Ht 5' 5.35" (1.66 m)  Wt 133 lb 2.5 oz (60.4 kg)  BMI 21.92 kg/m2  LMP 06/29/2015 Body mass index: body mass index is 21.92 kg/(m^2). Facility age limit for growth percentiles is 20 years.  Wt Readings from Last 3 Encounters:  07/27/15 133 lb 2.5 oz (60.4 kg)  07/23/15 133 lb (60.328 kg)  07/08/15 131 lb 2.8 oz (59.5 kg)   Physical Exam  Constitutional: She is oriented to person, place, and time. She appears well-developed. No distress.  Neck: Neck supple. No thyromegaly present.  Cardiovascular: Normal rate, regular rhythm and intact distal pulses.   No  murmur heard. Pulmonary/Chest: Effort normal and breath sounds normal.  Abdominal: Soft. Bowel sounds are normal. She exhibits no distension. There is no tenderness.  Neurological: She is alert and oriented to person, place, and time.  Skin: Skin is warm and dry.  Psychiatric: Her behavior is normal.  Nursing note and vitals reviewed.    Assessment/Plan: [name] is a 20 y.o. female with disordered eating and POTS. Nutritional status appears stable. No improvement seen with prozac to date, will continue to monitor. Has follow up with Denny Levy 07/28/15. UTD on screenings for zyprexa.   POTS: Given control of symptoms but ongoing hypotension, Dr. Mayer Camel was consulted for recommendations regarding dose reduction of atenolol. We agreed that atenolol should be cut to 12.5mg  for now. She will follow up with Dr. Mayer Camel.   Follow-up:  Return in about 4 weeks (around 08/24/2015).   Medical decision-making:  > 20 minutes spent, more than 50% of appointment was spent discussing diagnosis and management of symptoms

## 2015-07-27 NOTE — ED Notes (Signed)
Pt sts hx of POTTS and had syncope today and hit head; pt sts recent concussion from previous fall

## 2015-07-28 ENCOUNTER — Ambulatory Visit: Payer: BLUE CROSS/BLUE SHIELD | Admitting: *Deleted

## 2015-07-28 LAB — CBC WITH DIFFERENTIAL/PLATELET
BASOS ABS: 0.1 10*3/uL (ref 0.0–0.1)
BASOS PCT: 2 %
EOS ABS: 0.2 10*3/uL (ref 0.0–0.7)
Eosinophils Relative: 2 %
HCT: 37.5 % (ref 36.0–46.0)
HEMOGLOBIN: 12.7 g/dL (ref 12.0–15.0)
Lymphocytes Relative: 35 %
Lymphs Abs: 2.9 10*3/uL (ref 0.7–4.0)
MCH: 30.8 pg (ref 26.0–34.0)
MCHC: 33.9 g/dL (ref 30.0–36.0)
MCV: 91 fL (ref 78.0–100.0)
Monocytes Absolute: 0.6 10*3/uL (ref 0.1–1.0)
Monocytes Relative: 8 %
NEUTROS ABS: 4.4 10*3/uL (ref 1.7–7.7)
NEUTROS PCT: 53 %
Platelets: 297 10*3/uL (ref 150–400)
RBC: 4.12 MIL/uL (ref 3.87–5.11)
RDW: 11.8 % (ref 11.5–15.5)
WBC: 8.2 10*3/uL (ref 4.0–10.5)

## 2015-07-28 LAB — BASIC METABOLIC PANEL
ANION GAP: 9 (ref 5–15)
BUN: 10 mg/dL (ref 6–20)
CHLORIDE: 110 mmol/L (ref 101–111)
CO2: 22 mmol/L (ref 22–32)
CREATININE: 0.91 mg/dL (ref 0.44–1.00)
Calcium: 9.5 mg/dL (ref 8.9–10.3)
GFR calc non Af Amer: >60 mL/min (ref >60)
Glucose, Bld: 97 mg/dL (ref 65–99)
POTASSIUM: 3.7 mmol/L (ref 3.5–5.1)
SODIUM: 141 mmol/L (ref 135–145)

## 2015-07-28 LAB — MAGNESIUM: MAGNESIUM: 1.9 mg/dL (ref 1.7–2.4)

## 2015-07-28 LAB — I-STAT BETA HCG BLOOD, ED (MC, WL, AP ONLY)

## 2015-07-28 MED ORDER — SODIUM CHLORIDE 0.9 % IV BOLUS (SEPSIS)
1000.0000 mL | Freq: Once | INTRAVENOUS | Status: AC
  Administered 2015-07-28: 1000 mL via INTRAVENOUS

## 2015-07-28 NOTE — Discharge Instructions (Signed)
Syncope Ms. Jessica [name], your blood work and EKG today were normal.  See your cardiologist within 3 days for close follow up and adjustment of your medications.  If symptoms worsen, come back to the ED immediately.  Thank you. Syncope means a person passes out (faints). The person usually wakes up in less than 5 minutes. It is important to seek medical care for syncope. HOME CARE  Have someone stay with you until you feel normal.  Do not drive, use machines, or play sports until your doctor says it is okay.  Keep all doctor visits as told.  Lie down when you feel like you might pass out. Take deep breaths. Wait until you feel normal before standing up.  Drink enough fluids to keep your pee (urine) clear or pale yellow.  If you take blood pressure or heart medicine, get up slowly. Take several minutes to sit and then stand. GET HELP RIGHT AWAY IF:   You have a severe headache.  You have pain in the chest, belly (abdomen), or back.  You are bleeding from the mouth or butt (rectum).  You have black or tarry poop (stool).  You have an irregular or very fast heartbeat.  You have pain with breathing.  You keep passing out, or you have shaking (seizures) when you pass out.  You pass out when sitting or lying down.  You feel confused.  You have trouble walking.  You have severe weakness.  You have vision problems. If you fainted, call for help (911 in [place] Do not drive yourself to the hospital.   This information is not intended to replace advice given to you by your health care provider. Make sure you discuss any questions you have with your health care provider.   Document Released: 12/27/2007 Document Revised: 11/24/2014 Document Reviewed: 09/08/2011 Elsevier Interactive Patient Education [employer]2016 [employer]

## 2015-07-28 NOTE — ED Provider Notes (Signed)
CSN: [id number]     Arrival date & time 07/27/15  1845 History  By signing my name below, I, Jessica [name], attest that this documentation has been prepared under the direction and in the presence of [name], MD . Electronically Signed: Freida [name], Scribe. 07/28/2015. 1:07 AM.   Chief Complaint  Patient presents with  . Head Injury  . Loss of Consciousness    The history is provided by the patient. No language interpreter was used.    HPI Comments:  Jessica [name] is a 20 y.o. female with a history of POTS and anorexia, who presents to the Emergency Department s/p unwitnessed syncopal episode a few hours PTA complaining of HA with pain diffusely throughout. She notes she was using the bathroom and woke up on the floor. She believes she struck her head on either the cabinet or floor. Pt is unsure how long she lost consciousness but notes she called boyfriend before she went to the bathroom and again when she regained consciousness ~ 7 minutes later. She reports chronic abdominal pain and an episode of diarrhea today. No alleviating factors noted. Pt denies hematuria, and recent abnormal vaginal bleeding but notes she normally has abnormal menstrual cycles.  Pt notes she fell and hit head ~2 weeks ago. She was evaluated in the ED on 07/15/15 after the fall and was diagnosed with a concussion. She also denies fever and cough.    Past Medical History  Diagnosis Date  . POTS (postural orthostatic tachycardia syndrome)   . Migraine headache   . Syncope 09/22/2014   Past Surgical History  Procedure Laterality Date  . Eye surgery  2000    clogged tear duct  . Anterior cruciate ligament repair  03/2010  . Wisdom tooth extraction     Family History  Problem Relation Age of Onset  . Migraines Mother     Started 4th or 5th grade  . Seizures Mother     Febrile Seizures as a child  . Seizures Father     Febrile Seizures as a child  . Migraines Maternal Aunt   . Migraines Maternal Uncle   .  Mental retardation Other     Maternal Second Cousin  . Other Other     Maternal Great Uncle had some sort of Retinal Vessel Occlusion  . Stroke Maternal Grandfather     mini-stroke  . Cancer Maternal Grandfather     Bladder cancer, Died at 46  . Diabetes Maternal Grandmother   . Diabetes Maternal Grandfather   . Hypertension Father   . Kidney Stones Father   . Breast cancer Maternal Aunt   . Cancer Maternal Aunt     Peritoneal CA  . Eating disorder Maternal Aunt   . Depression      Father's side of the family   Social History  Substance Use Topics  . Smoking status: Never Smoker   . Smokeless tobacco: Never Used  . Alcohol Use: No   OB History    No data available     Review of Systems  10 systems reviewed and all are negative for acute change except as noted in the HPI.    Allergies  Amoxicillin  Home Medications   Prior to Admission medications   Medication Sig Start Date End Date Taking? Authorizing Provider  atenolol (TENORMIN) 25 MG tablet Take 0.5 tablets (12.5 mg total) by mouth daily. 07/27/15  Yes Tyrone Nine, MD  Calcium-Vitamin D-Vitamin K (VIACTIV PO) Take 1 tablet by  mouth daily.    Yes Historical Provider, MD  fludrocortisone (FLORINEF) 0.1 MG tablet Take 1 tablet by mouth daily 04/13/15  Yes Owens Shark, MD  FLUoxetine (PROZAC) 40 MG capsule Take 1 capsule (40 mg total) by mouth daily. 07/08/15  Yes Owens Shark, MD  Multiple Vitamin (MULTIVITAMIN WITH MINERALS) TABS tablet Take 1 tablet by mouth daily.   Yes Historical Provider, MD  norethindrone-ethinyl estradiol-iron (JUNEL FE 1.5/30) 1.5-30 MG-MCG tablet Take 1 tablet by mouth daily. 04/13/15  Yes Owens Shark, MD  OLANZapine (ZYPREXA) 2.5 MG tablet Take 0.5 tablets (1.25 mg total) by mouth at bedtime. 05/14/15  Yes Owens Shark, MD  polyethylene glycol [facility] / Ethelene Hal) packet Take 17 g by mouth daily.   Yes Historical Provider, MD  ranitidine (ZANTAC) 150 MG tablet TAKE 1 TABLET BY MOUTH  2 TIMES DAILY. 07/08/15  Yes Christianne Dolin, NP  SUMAtriptan (IMITREX) 25 MG tablet Take 25 mg by mouth every 2 (two) hours as needed for migraine. May repeat in 2 hours if headache persists or recurs.   Yes Historical Provider, MD  TROKENDI XR 200 MG CP24 Take 1 capsule daily 07/23/15  Yes Elveria Rising, NP  TROKENDI XR 50 MG CP24 Take 1 capsule daily along with your Topiramate ER 150mg  to equal 200mg  per day Patient not taking: Reported on 07/28/2015 07/23/15   Elveria Rising, NP   BP 104/70 mmHg  Pulse 70  Temp(Src) 98.8 F (37.1 C) (Oral)  Resp 24  SpO2 100%  LMP 06/29/2015 Physical Exam  Constitutional: She is oriented to person, place, and time. She appears well-developed and well-nourished. No distress.  HENT:  Head: Normocephalic and atraumatic.  Nose: Nose normal.  Mouth/Throat: Oropharynx is clear and moist. No oropharyngeal exudate.  Eyes: Conjunctivae and EOM are normal. Pupils are equal, round, and reactive to light. No scleral icterus.  Neck: Normal range of motion. Neck supple. No JVD present. No tracheal deviation present. No thyromegaly present.  Cardiovascular: Normal rate, regular rhythm and normal heart sounds.  Exam reveals no gallop and no friction rub.   No murmur heard. Pulmonary/Chest: Effort normal and breath sounds normal. No respiratory distress. She has no wheezes. She exhibits no tenderness.  Abdominal: Soft. Bowel sounds are normal. She exhibits no distension and no mass. There is no tenderness. There is no rebound and no guarding.  Musculoskeletal: Normal range of motion. She exhibits no edema or tenderness.  Lymphadenopathy:    She has no cervical adenopathy.  Neurological: She is alert and oriented to person, place, and time. No cranial nerve deficit. She exhibits normal muscle tone.  Normal strength and sensation in all extremities. Normal cerebellar testing. Normal gait.  Skin: Skin is warm and dry. No rash noted. No erythema. No pallor.   Nursing note and vitals reviewed.   ED Course  Procedures   DIAGNOSTIC STUDIES:  Oxygen Saturation is 100% on RA, normal by my interpretation.    COORDINATION OF CARE:  12:49 AM Discussed treatment plan with pt at bedside and pt agreed to plan.  Labs Review Labs Reviewed  CBC WITH DIFFERENTIAL/PLATELET  BASIC METABOLIC PANEL  MAGNESIUM  I-STAT BETA HCG BLOOD, ED ([facility], [facility], [facility] ONLY)    Imaging Review No results found. I have personally reviewed and evaluated these images and lab results as part of my medical decision-making.   EKG Interpretation   Date/Time:  Wednesday July 28 2015 00:24:47 EST Ventricular Rate:  69 PR Interval:  137 QRS Duration: 84  QT Interval:  434 QTC Calculation: 465 R Axis:   62 Text Interpretation:  Sinus rhythm No significant change since last  tracing Confirmed by Erroll Lunani, Mariateresa Batra Ayokunle [phone]([id number]) on 07/28/2015 1:03:52 AM      MDM   Final diagnoses:  None   Patient presents to the emergency department for syncopal episode while having diarrhea. She states she normally has syncope with her menstrual cycles. This is likely a vasovagal episode. She is also strongly with anorexia, will obtain laboratory studies to evaluate for anemia or dehydration. We'll liter IV fluids given. She has a completely normal neurological exam, do not believe CT scan of the head is warranted. In addition she fell from the toilet seat to the floor, this is not major,. We'll continue to monitor.    neurological exam remains normal, laboratory studies are unremarkable. Patient was counseled on vasovagal symptoms with menstraul cycle and having POTS likely contributing.  Follow up advised within 3 days with her cardiologist.  She appears well and in NAD.  VS remain within her normal limits and she is safe for Dc.   I personally performed the services described in this documentation, which was scribed in my presence. The recorded information has been reviewed and is  accurate.      Tomasita CrumbleAdeleke Caileen Veracruz, MD 07/28/15 [phone]

## 2015-07-29 ENCOUNTER — Telehealth: Payer: Self-pay | Admitting: Family

## 2015-07-29 NOTE — Telephone Encounter (Signed)
I reviewed your note and agree with your recommendations, Thank you

## 2015-07-29 NOTE — Telephone Encounter (Signed)
Jessica [name] left message asking for call back about Jessica [name]. I left her a message and asked her to call me back. Jessica called me back and left a message saying that she was available. I called Jessica and she said that [name] has been sleeping a lot since the concussion, saying she is tired. She was seen in the ER earlier this week for a syncopal episode. She was also seen by her PCP for her anorexia and the recommendation was made to decrease the dose of Atenolol. Jessica is unsure about that and wonders if the Florinef dose should increase. Jessica CousinsCiara has an appointment with her cardiologist on Monday Jan 9th and Jessica plans to discuss that question at that appointment. Jessica's question for me today was whether or not Jessica [name] should be awakened for meals if she is sleeping during the day. I talked with Jessica and told her that it was ok for Jessica [name] to nap during the day as she needed to, but that she should not sleep all day and all night without taking in food or liquids. I recommended a schedule of wakening her every few hours during the day for snacks and liquids. I explained to Jessica that she needs the hydration whether or not she is up and active. I also recommended that Jessica [name] have some daily exercise and try to stay on a routine, even if she needs to nap in between. Jessica said that Jessica [name] said that the headache was not severe now but continued to be present. I explained to Jessica that it will take time for her to recover from the concussion, but that staying on a simple routine and eating and drinking sufficient amounts each day were also important for her recovery. I will be happy to see Jessica [name] sooner than her scheduled appointment this month if needed. Jessica agreed with these recommendations. TG

## 2015-08-04 ENCOUNTER — Ambulatory Visit: Payer: BLUE CROSS/BLUE SHIELD | Admitting: *Deleted

## 2015-08-04 ENCOUNTER — Encounter: Payer: BLUE CROSS/BLUE SHIELD | Attending: Pediatrics | Admitting: *Deleted

## 2015-08-04 ENCOUNTER — Ambulatory Visit (INDEPENDENT_AMBULATORY_CARE_PROVIDER_SITE_OTHER): Payer: BLUE CROSS/BLUE SHIELD | Admitting: *Deleted

## 2015-08-04 ENCOUNTER — Encounter: Payer: Self-pay | Admitting: *Deleted

## 2015-08-04 VITALS — BP 109/74 | HR 81 | Ht 65.24 in | Wt 133.8 lb

## 2015-08-04 DIAGNOSIS — Z713 Dietary counseling and surveillance: Secondary | ICD-10-CM | POA: Diagnosis not present

## 2015-08-04 DIAGNOSIS — Z1389 Encounter for screening for other disorder: Secondary | ICD-10-CM | POA: Diagnosis not present

## 2015-08-04 DIAGNOSIS — F5000 Anorexia nervosa, unspecified: Secondary | ICD-10-CM

## 2015-08-04 DIAGNOSIS — F509 Eating disorder, unspecified: Secondary | ICD-10-CM

## 2015-08-04 LAB — POCT URINALYSIS DIPSTICK
Bilirubin, UA: NEGATIVE
Glucose, UA: NEGATIVE
Ketones, UA: NEGATIVE
NITRITE UA: NEGATIVE
PH UA: 7
PROTEIN UA: NEGATIVE
RBC UA: NEGATIVE
Spec Grav, UA: 1.01
Urobilinogen, UA: NEGATIVE

## 2015-08-04 NOTE — Progress Notes (Signed)
  Appointment start time: 1600  Appointment end time: 1700   Patient was seen on 08/04/15 for nutrition counseling pertaining to disordered eating.  She is accompanied by her mother  Primary care provider: Dr. Rana SnareLowe Therapist: Noni SaupeHeather Kitchen, weekly    Any other medical team members: Dr. Marina GoodellPerry Parents: Bonnita LevanJoy Thomas  Assessment:   2 concussions in the past couple weeks Was not able to see cardiologist due to inclimate weather. Is rescheduled for 1/30.  Will get vitals checked in clinic today atenalol was cut in half.  Mom questions this.   Weight is stable   Growth Metrics: Ideal BMI for age: 7121.6 BMI 07/27/15: 21.5 % Ideal:  99.5% Previous growth data: weight/age  34-90th%; height/age at 75%; BMI/age 39-85% Goal BMI range based on growth chart data: 22-25 Goal weight range based on growth chart data: 130-145 lb Goal rate of weight gain:  0.5 lb/week   Mental health diagnosis: AN, B/P subtype   Dietary assessment: mom says she's always been picky; currently following gluten-free diet  Safe foods include: salad with minimal dressing, fruits, oatmeal, rice chex Avoided foods include:desserts, meat, pasta, fast food, chips, pizza, bread, all other potatoes besides baked  24 hour recall B: rice chex with banana and soy milk L: chicken salad with crackers (10) clementine and 16 oz water S: Luna bar x 2 D: chitpole salad with chicken, cheese, salad, brown rice, pico, 10 oz water S: dunkin with cream anad flavoring and clementine  Today so far B: Luna bar S: some of coffee L: 1/2 bowl chicken chili, propel S: Luna bar  Monday Woke up nauseus B: oatmeal with pecans L: chicken salad, 8 bagel chips 4 oz water D: panera soup and salad 16 oz water S: yogurt with granola  Sunday B: rice chex with soy milk and banana L: chicken salad with 8 bagel chips, propel S: apple and nutella.  Upset stomach D: chicken chili and some propel S: hot chocolate Ice cream- woke up nauseas the  next morning. Really tasty, but pretty tasty   Estimated energy intake: 1400 kcal  Estimated energy needs: 2200 kcal for weight restoration    275 g CHO 110 g pro 73 g fat  Nutrition Diagnosis: NI-1.4 Inadequate energy intake As related to restricting and purging.  As evidenced by weight loss of 16 pounds .  Intervention/Goals: Nutrition counseling provided. Challenged cognitive distortions.  Discussed what Ed has taken from her and how she can get it back.  Discussed how she is not at goal weight yet because she is still consciously restricting.  Goal weight is when she eats intuitively.  While her current weight is WNL, she is still restricting so she is not at goal.   Goals: Ensure 2 items with breakfast Challenge Ed thoughts Ensure adequate hydration Ensure 5 eating occasions daily  Use anxiety apps Eat gluten-free pasta Try ginger products   Monitoring and Evaluation: Patient will follow up in 1 weeks

## 2015-08-04 NOTE — Progress Notes (Signed)
VS reviewed by NP. Approved for d/c and scheduled f/u.

## 2015-08-05 ENCOUNTER — Encounter: Payer: Self-pay | Admitting: *Deleted

## 2015-08-05 NOTE — Progress Notes (Signed)
BP has improved since syncopal episode. Patient's cardiology appointment rescheduled due to weather to 1/30. Advised patient to continue to move slowly from lying to standing and return if worsening dizziness before cardiology appointment. Weight stable from d/e standpoint.

## 2015-08-13 ENCOUNTER — Encounter: Payer: BLUE CROSS/BLUE SHIELD | Admitting: *Deleted

## 2015-08-13 ENCOUNTER — Encounter: Payer: Self-pay | Admitting: *Deleted

## 2015-08-13 DIAGNOSIS — F509 Eating disorder, unspecified: Secondary | ICD-10-CM

## 2015-08-13 NOTE — Patient Instructions (Signed)
2 fruit, 2-3 vegetables/day (1/2 cup cooked, 1 cup raw is a serving) Only 1 salad each day Continue fluids  Increase whole grains like quinoa, oatmeal, brown rice Try adding ground flax quinoa pasta Bring back trail mix Add prunes Tortillas Refried beans Sweet potatoes Black rice popcorn  Deep Roots, Fresh Market, Earth Fare Fight back against Ed thoughts!! Still need 5 eating occasions each day Still need 2 options for breakfast

## 2015-08-13 NOTE — Progress Notes (Signed)
  Appointment start time: 1600  Appointment end time: 1700   Patient was seen on 08/04/15 for nutrition counseling pertaining to disordered eating.  She is accompanied by her mother  Primary care provider: Dr. Rana Snare Therapist: Noni Saupe, weekly    Any other medical team members: Dr. Marina Goodell Parents: [name]  Assessment:   Started back at school.   microbiologiy teacher is  making statements of food safety that are triggering Thinks her eating has been good   Growth Metrics: Ideal BMI for age: 30.6 BMI 07/27/15: 21.5 % Ideal:  99.5% Previous growth data: weight/age  78-90th%; height/age at 75%; BMI/age 34-85% Goal BMI range based on growth chart data: 22-25 Goal weight range based on growth chart data: 130-145 lb Goal rate of weight gain:  0.5 lb/week   Mental health diagnosis: AN, B/P subtype   Dietary assessment: mom says she's always been picky; currently following gluten-free diet  Safe foods include: salad with minimal dressing, fruits, oatmeal, rice chex Avoided foods include:desserts, meat, pasta, fast food, chips, pizza, bread, all other potatoes besides baked  24 hour recall B: Luna bar 20 oz water L: salad with cheese, veggies, crutons, 1 slice deli [place], 4 ginger snaps 20 oz water S: yogurt with granola and 10 oz water D: 6 count grilled chicken nugget and fruit cup, some spirte S: banana nut bread and sprite  wendesday B: yogurt and banana L: chicken fil a side salad, Luna bar 16 oz water S: greek yogurt yogurt with granola D: taco salad with beans, salsa, cheese, full deviled egg, 3 bite cinnamon apples, cherry fluff S: banana bread (2 slices)  Tuesday B: rice chex with banana L: black bean burger with guac, cheese, some tortilla chips S: Luna bar D: pesto chicken salad  S: Luna bar 2 propels and some water  Sunday B: [place] protein bar 5 oz water L: subway salad with [place] and cheese, water S: apple with peanut butter, ginger snaps and  water D: frozen Amy's meal  8 oz water S: dunkin coffee.  No full    Estimated energy intake: 1500 kcal  Estimated energy needs: 2200 kcal for weight restoration    275 g CHO 110 g pro 73 g fat  Nutrition Diagnosis: NI-1.4 Inadequate energy intake As related to restricting and purging.  As evidenced by weight loss of 16 pounds .  Intervention/Goals: Nutrition counseling provided. Challenged cognitive distortions.  Encouraged her to challenge Ed thoughts at home.  Discussed "going on offensive" against Ed to get her life back.  Discussed ways to increase fiber for constipation: increase vegetables and whole grains.  Still need adequate fluids, and still need to eat adequate starches, fats, proteins.  Discussed appropriate portion sizes for vegetables.  Elaina was surprised.  Advised following meal plan despite fullness as internal cues are not yet regulated  Goals 2 fruit, 2-3 vegetables/day (1/2 cup cooked, 1 cup raw is a serving) Only 1 salad each day Continue fluids  Increase whole grains like quinoa, oatmeal, brown rice Try adding ground flax to gluten-free baked goods quinoa pasta; trail mix; prunes; Tortillas; Refried beans; Sweet potatoes; Black rice; popcorn  Deep Roots, Fresh Market, Earth Fare Fight back against Ed thoughts!! Still need 5 eating occasions each day Still need 2 options for breakfast  Monitoring and Evaluation: Patient will follow up in 1 weeks

## 2015-08-18 ENCOUNTER — Ambulatory Visit (INDEPENDENT_AMBULATORY_CARE_PROVIDER_SITE_OTHER): Payer: BLUE CROSS/BLUE SHIELD | Admitting: Family

## 2015-08-18 ENCOUNTER — Encounter: Payer: Self-pay | Admitting: Family

## 2015-08-18 VITALS — BP 90/60 | HR 84 | Ht 65.0 in | Wt 133.2 lb

## 2015-08-18 DIAGNOSIS — G43009 Migraine without aura, not intractable, without status migrainosus: Secondary | ICD-10-CM

## 2015-08-18 DIAGNOSIS — I951 Orthostatic hypotension: Secondary | ICD-10-CM | POA: Diagnosis not present

## 2015-08-18 DIAGNOSIS — G44219 Episodic tension-type headache, not intractable: Secondary | ICD-10-CM | POA: Diagnosis not present

## 2015-08-18 DIAGNOSIS — G90A Postural orthostatic tachycardia syndrome (POTS): Secondary | ICD-10-CM

## 2015-08-18 DIAGNOSIS — S060X1D Concussion with loss of consciousness of 30 minutes or less, subsequent encounter: Secondary | ICD-10-CM

## 2015-08-18 DIAGNOSIS — R000 Tachycardia, unspecified: Secondary | ICD-10-CM | POA: Diagnosis not present

## 2015-08-18 NOTE — Progress Notes (Signed)
Patient: ETHER GOEBEL MRN: [id number] Sex: female DOB: 1996-06-30  Provider: Elveria Rising, NP Location of Care: [facility]  Note type: Routine return visit  History of Present Illness: Referral Source: [name], MD History from: mother, patient and [facility] chart Chief Complaint: Head Injury F/U  GLYN ZENDEJAS is a 20 y.o. girl with history of migraines, episodes of orthostatic hypotension, palpitations, syncope, anorexia nervosa. She was last seen July 23, 2015 and returns for follow up today for a head injury that occurred on July 15, 2015. Kerra has migraine without aura and neurally mediated syncope. She has vasovagal syncope with orthostatic intolerance that is similar to POTS. This has been treated with a combination of hydration, exercise, elevation of the head of her bed, atenolol given twice a day, midodrine every four hours during the daytime as needed for fatigue and Florinef. She has been taking Topiramate ER  for migraine prophylaxis, and was doing well until she fell and hit her head on July 15, 2015.   On that day, Daniyah said that she was exiting her garage, when she tripped and fell, striking the back of her head and injuring her right arm. She said that she lost consciousness when she hit her head but is unsure how for what length of time. Mom took her to be evaluated in the ER, where she was told that she had a concussion, a large abrasion to her right elbow and a possible right wrist fracture. After that head injury, she experienced ongoing severe headache with nausea. Mom noted that Tuana has some word finding and slower processing since the head injury. Today Juanna reports that the headaches are improving, but still occur on about half the days of the week. She has been keeping record of her headaches but neglected to bring that today. She says that the headaches are less severe, and that she no longer has nausea for the most part. Her  mother continues to note that Eryka has some word finding and slower processing skills. She gave an example of turning the wrong way on a one way street that was familiar to her, and getting lost once while driving to a familiar location. Vanshika has returned to her college classes, and has noted some problems with reading comprehension and fatigue when reading. She is enrolled in a history class that is both reading and writing intensive. In addition, she says the professor lectures for the entire class, and does not use outlines or powerpoint presentations. She is struggling in that course at this time. [name] made straight A's in the fall semester and tends to put pressure on herself to perform well in school. Sophee says that since the head injury that she tires more quickly and has had to take a nap each day.   Imanni was seen in the ER on July 27, 2015 for a syncopal episode. Mom says that she has an upcoming appointment with cardiology next week. Melitza says that she has been trying to drink more water. She sleeps 6-8 hours at night and usually naps during the day since the head injury occurred. She denies skipping meals.   Neither Jacklyne nor her mother have other health concerns for her today other than previously mentioned  Review of Systems: Please see the HPI for neurologic and other pertinent review of systems. Otherwise, the following systems are noncontributory including constitutional, eyes, ears, nose and throat, cardiovascular, respiratory, gastrointestinal, genitourinary, musculoskeletal, skin, endocrine, hematologic/lymph, allergic/immunologic and psychiatric.  Past Medical History  Diagnosis Date  . POTS (postural orthostatic tachycardia syndrome)   . Migraine headache   . Syncope 09/22/2014   Hospitalizations: Yes.  , Head Injury: Yes.  , Nervous System Infections: No., Immunizations up to date: Yes.   Past Medical History Comments: She had onset of headaches, some of them migraines  beginning in the 4th or 5th grade. These involve stabbing left-sided pain that lasts for an hour before easing. The episodes are recurrent and are associated with nausea and vomiting, left sided throbbing, no sensitivity to light, sound, or movement. The patient recently had at least one episode of 10-15 minutes of tingling in her face and fingers associated with headaches. She also had episodes of what appeared to be positional vertigo, but became syncope. She was evaluated by Dr. Darlis Loan on April 08, 2012. He noted a previous evaluation, June 22, 2011 for syncope. EKG and echocardiogram were normal. 24-hour Holter monitor was normal. She had persistent episodes of tachycardia and dizziness following the evaluation. She has had initial improvement with Florinef; however, on 0.3 mg per day, she did not have persistent improvement. She complained of three episodes of loss of consciousness per month. On March 04, 2012, she had an episode of stiffening which was unusual. A decision was made to place her on atenolol when her pulse went up from 68 to 105 from supine to standing.   Surgical History Past Surgical History  Procedure Laterality Date  . Eye surgery  2000    clogged tear duct  . Anterior cruciate ligament repair  03/2010  . Wisdom tooth extraction      Family History family history includes Breast cancer in her maternal aunt; Cancer in her maternal aunt and maternal grandfather; Diabetes in her maternal grandfather and maternal grandmother; Eating disorder in her maternal aunt; Hypertension in her father; Kidney Stones in her father; Mental retardation in her other; Migraines in her maternal aunt, maternal uncle, and mother; Other in her other; Seizures in her father and mother; Stroke in her maternal grandfather. Family History is otherwise negative for migraines, seizures, cognitive impairment, blindness, deafness, birth defects, chromosomal disorder, autism.  Social  History Social History   Social History  . Marital Status: Single    Spouse Name: N/A  . Number of Children: N/A  . Years of Education: N/A   Social History Main Topics  . Smoking status: Never Smoker   . Smokeless tobacco: Never Used  . Alcohol Use: No  . Drug Use: No  . Sexual Activity: No   Other Topics Concern  . 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 She lives with her parents and siblings. She enjoys school, shopping,watching TV, drinking decaf coffee and helping with children at church.     Allergies Allergies  Allergen Reactions  . Amoxicillin Rash    Physical Exam BP 90/60 mmHg  Pulse 84  Ht  (1.651 m)  Wt 133 lb 3.2 oz (60.419 kg)  BMI 22.17 kg/m2 General: alert, well developed, well nourished girl, in no acute distress, right-handed, brown hair, brown eyes  Head: normocephalic, no dysmorphic features; no localized tenderness  Ears, Nose and Throat: Otoscopic: tympanic membranes normal . Pharynx: oropharynx is pink without exudates or tonsillar hypertrophy.  Neck: supple, full range of motion, no cranial or cervical bruits  Respiratory: auscultation clear  Cardiovascular: no murmurs, pulses are normal  Musculoskeletal: no skeletal deformities or apparent  scoliosis. She complains of pain with any movement of her right elbow and wrist.  Skin: no rashes or neurocutaneous lesions. She has a bandage on a large abrasion on her right elbow.  Neurologic Exam  Mental Status: alert; oriented to person, place, and year; knowledge is normal for age; language is normal  Cranial Nerves: visual fields are full to double simultaneous stimuli; extraocular movements are full and conjugate; pupils are round reactive to light; funduscopic examination shows sharp disc margins with normal vessels; symmetric facial strength; midline tongue and uvula; hearing is normal and symmetric  Motor: Normal strength, tone, and  mass; good fine motor movements; no pronator drift.  Sensory: intact responses to touch and temperature  Coordination: good finger-to-nose, rapid repetitive alternating movements and finger apposition  Gait and Station: normal gait and station; patient is able to walk on heels, toes and tandem without difficulty; balance is adequate; Romberg exam is negative; Gower response is negative  Reflexes: symmetric and diminished bilaterally; no clonus; bilateral flexor plantar responses   Impression 1. Concussion with loss of consciousness 2. Post-concussion headache 3. Migraine with aura, without status migranosus, not intractable 4. Postural orthostatic tachycardia syndrome 5. History of episodic tension type headaches 6. History of syncope 7. Anorexia nervosa  Recommendations for plan of care The patient's previous [facility] records were reviewed. Avri has neither had nor required imaging or lab studies for the head injury since the last visit, but did have lab studies and an EKG in the ER after a syncopal event on January 3rd. Kashonda and her mother are aware of those results. Sueellen is a 20 year old young woman history of migraines, episodes of orthostatic hypotension, palpitations, syncope, anorexia nervosa and recent closed head injury. Maya tripped and fell at her home on December 22nd, striking the occiptal region of her head and injuring her right arm. She had loss of consciousness when she hit her head, estimated to be 1-2 minutes. Initially she had severe headache with nausea, as well as some trouble with speech and processing. Her headaches are improving and now occuring about half the days of the week instead of daily. Her cognitive difficulties are slowly improving but her problems are causing some problems in her college course work. I talked with Arlana and her mother about closed head injuries and reminded her that while she will improve, the speed of recovery varies from person to person. I  wrote a letter for her to give to her professors to request some temporary accommodations as she continues to recover. I reminded her of the need for increased hydration and rest as she recovers. I will see her back in follow up in 1 month or sooner if needed.   The medication list was reviewed and reconciled.  No changes were made in the prescribed medications today.  A complete medication list was provided to the patient.  Dr. Sharene Skeans was consulted regarding the patient.   Total time spent with the patient was 30 minutes, of which 50% or more was spent in counseling and coordination of care.

## 2015-08-19 ENCOUNTER — Other Ambulatory Visit: Payer: Self-pay | Admitting: Pediatrics

## 2015-08-19 ENCOUNTER — Ambulatory Visit: Payer: BLUE CROSS/BLUE SHIELD | Admitting: Family

## 2015-08-19 NOTE — Patient Instructions (Signed)
Continue with your medications as you have been taking them.   Remember that it is very important for you to be well hydrated, to sleep 8 hours at night and to avoid skipping meals.  It is ok for you to have a rest period each day as you continue to recover from your head injury.   You should engage in gentle exercise each day - such as walking, stretching etc.   I have written a letter for your professor to request some temporary accommodations for your class work. Let me know if you need anything else for school.   Please plan to return for follow up in 1 month or sooner if needed.

## 2015-08-20 ENCOUNTER — Encounter: Payer: BLUE CROSS/BLUE SHIELD | Admitting: *Deleted

## 2015-08-20 DIAGNOSIS — F509 Eating disorder, unspecified: Secondary | ICD-10-CM

## 2015-08-20 NOTE — Progress Notes (Signed)
Appointment start time: 0800  Appointment end time: 0845  Patient was seen on 08/20/15 for nutrition counseling pertaining to disordered eating.  She is accompanied by her boyfriend  Primary care provider: Dr. Rana Snare Therapist: Noni Saupe, weekly    Any other medical team members: Dr. Marina Goodell Parents: [name]  Assessment:  Jessica [name] states things are going well.  She tried (gluten free) cheerios some this week.  Increased fruits and veggies, but not as many of the whole grain gluten-free starches we discussed last week.  Starches still are a fear food even though she ate gluten free pasta as a food challenge a few weeks ago and loved it.  She's not getting all her meal plan consistently.  She claims this is not on purpose, she forgets or isn't hungry for the plan.  She isn't getting adequate fluids either.   Eats ginger snaps and ginger mango candies.  No upset stomach this week!  Had a few normal BM also.  Perhaps GI symptoms are slowly improving with improved nutrition?   Growth Metrics: Ideal BMI for age: 64.6 BMI 08/19/15: 22.17 % Ideal:  100% Previous growth data: weight/age  70-90th%; height/age at 75%; BMI/age 41-85% Goal BMI range based on growth chart data: 22-25 Goal weight range based on growth chart data: 130-145 lb No further weight restoration needed.  However, she is still restricting.  When she eats intuitively, she will most likely gain a little more weight   Mental health diagnosis: AN, B/P subtype   Dietary assessment: mom says she's always been picky; currently following gluten-free diet  Safe foods include: salad with minimal dressing, fruits, oatmeal, rice chex Avoided foods include:desserts, meat, pasta, fast food, chips, pizza, bread, all other potatoes besides baked  24 hour recall B: Luna bar L: grilled chicken and sweet potatoes S: bannaa nad  nutella D: fugi apple chicken salad and tomato soup S: coffee Some water  Wednesday B: luna bar and coffee L:  chick fil a salad with water S: trail mix (1/2 cup) D: sweet potato, 1/4 cup salad, apple, deviled egg, water S: apple and peanut butter  tueday B: luna bar L: black bean and corn salsa , 8 chips and full cheese quesadilla.  Water S: none D: grilled chicken, veggies, potatoes.  Water S: apple and peanut butter  Monday B: frosted cheerios L: 1/2 cup black bean salasa and chip.  2 prunes and propel water S1/2  [place] trail mix D: salmon patty, brown rice, corn and carrots, side salad S: banana and peanut butter  Sunday B: luna bar L: grilled chicken, baked sweet potato and broccoli.  Water S: granola bar, special K D: salad, enchilada casserole.  Some water S: carnation breakfast essentials  Saturday B: cheerios L: enchilada casserole S: 1/2 cup trail mix D: kids chicken, rice, refried beans S: apple and peanut butter  Friday B: luna and banana S: luna L: black bean burger and tortilla chip  D:  enchilaada casserole S: banana   Estimated energy intake: 1200-1400 kcal  Estimated energy needs: 2200 kcal for weight restoration    275 g CHO 110 g pro 73 g fat  Nutrition Diagnosis: NI-1.4 Inadequate energy intake As related to restricting and purging.  As evidenced by weight loss of 16 pounds .  Intervention/Goals: Nutrition counseling provided. Challenged cognitive distortions.  Encouraged her to challenge Ed thoughts at home.  Discussed "going on offensive" against Ed to get her life back.    Still need adequate fluids, and  still need to eat adequate starches, fats, proteins.    Advised following meal plan despite fullness as internal cues are not yet regulated.  Installed water app on her phone to remind her to drink.  Discussed alternative snack options for variety  Goals Snack ideas: Yogurt and granola [employer] [place] protein bars Research officer, trade union and gluten-free crackers 6 Tortilla chips and 1/2 cup guacamole  Gluten free ginger snaps   Use water app to remind  you to drink more! 5 eating occasions!!!!!!- breakfast needs 2 items, snacks need 2 macronutrients  (carb and protein or fat) Plain coffee with syrup doesn't count as a snack; coffee drink does count as a snack Keep up good with veggies and fruit Keep working on gluten-free grains.... Could have gluten-free pasta!!!  Challenge Ed at home- do your studying, don't just go to class.!!   Monitoring and Evaluation: Patient will follow up in 1 weeks

## 2015-08-20 NOTE — Patient Instructions (Signed)
Snack ideas: Yogurt and granola [employer] [place] protein bars Research officer, trade union and gluten-free crackers 6 Tortilla chips and 1/2 cup guacamole  Gluten free ginger snaps   Use water app to remind you to drink more! 5 eating occasions!!!!!!- breakfast needs 2 items, snacks need 2 macronutrients  (carb and protein or fat) Plain coffee with syrup doesn't count as a snack; coffee drink does count as a snack Keep up good with veggies and fruit Keep working on gluten-free grains.... Could have gluten-free pasta!!!  Challenge Ed at home- do your studying, don't just go to class.!!

## 2015-08-21 ENCOUNTER — Encounter: Payer: Self-pay | Admitting: *Deleted

## 2015-08-23 ENCOUNTER — Encounter: Payer: Self-pay | Admitting: Family

## 2015-08-23 ENCOUNTER — Ambulatory Visit (INDEPENDENT_AMBULATORY_CARE_PROVIDER_SITE_OTHER): Payer: BLUE CROSS/BLUE SHIELD | Admitting: Family

## 2015-08-23 ENCOUNTER — Telehealth: Payer: Self-pay | Admitting: *Deleted

## 2015-08-23 VITALS — BP 100/70 | HR 91 | Ht 65.35 in | Wt 133.4 lb

## 2015-08-23 DIAGNOSIS — F5000 Anorexia nervosa, unspecified: Secondary | ICD-10-CM | POA: Diagnosis not present

## 2015-08-23 DIAGNOSIS — F4323 Adjustment disorder with mixed anxiety and depressed mood: Secondary | ICD-10-CM

## 2015-08-23 DIAGNOSIS — K219 Gastro-esophageal reflux disease without esophagitis: Secondary | ICD-10-CM | POA: Diagnosis not present

## 2015-08-23 DIAGNOSIS — Z1389 Encounter for screening for other disorder: Secondary | ICD-10-CM

## 2015-08-23 LAB — POCT URINALYSIS DIPSTICK
BILIRUBIN UA: NEGATIVE
GLUCOSE UA: NEGATIVE
Ketones, UA: NEGATIVE
NITRITE UA: NEGATIVE
RBC UA: NEGATIVE
UROBILINOGEN UA: NEGATIVE
pH, UA: 7

## 2015-08-23 NOTE — Patient Instructions (Signed)
Please keep scheduled appointments with cardiology and Vernona Rieger and see [place] back in 2 weeks.

## 2015-08-23 NOTE — Progress Notes (Signed)
THIS RECORD MAY CONTAIN CONFIDENTIAL INFORMATION THAT SHOULD NOT BE RELEASED WITHOUT REVIEW OF THE SERVICE PROVIDER.  Adolescent Medicine Consultation Follow-Up Visit Jessica [name]  is a 20 y.o. female referred by Jessica Mast, MD here today for follow-up.    Previsit planning completed:  no  Growth Chart Viewed? yes   History was provided by the patient and mother.  PCP Confirmed?  Yes -  Jessica Mast, MD   My Chart Activated?   yes   HPI:   Per mom:  -Syncopal episodes r/t to menstrual cycle? Mom concerned about upcoming cycle.  -Seems to have a connection to stomach pain on onset. Jessica [name] agrees. Still having issues with acid regurgitation despite taking Zantac twice daily.  -Seeing Vernona [name] again tomorrow. Has issues with incorporating Carnation breakfast or other breakfast exchange; when she does, the rest of the day is thrown off in some way. Yesterday was difficult because of church luncheon - no salad was left when she was at the end of the serving line.  -she continues to have dizziness with standing and postural changes; not worsening, no falls, no blackouts.  -she was seen by Neuro last month to f/u on concussion and reports that symptoms are improving slowly.  -she denies a headache at present.  -she has decreased the atenolol from 25 mg to 12.5 mg since last OV as per instructed.  -cardiology f/u scheduled for today.    Patient's last menstrual period was 07/27/2015. Allergies  Allergen Reactions  . Amoxicillin Rash   Outpatient Encounter Prescriptions as of 08/23/2015  Medication Sig  . atenolol (TENORMIN) 25 MG tablet Take 0.5 tablets (12.5 mg total) by mouth daily.  . Calcium-Vitamin D-Vitamin K (VIACTIV PO) Take 1 tablet by mouth daily.   . fludrocortisone (FLORINEF) 0.1 MG tablet Take 1 tablet by mouth daily  . FLUoxetine (PROZAC) 40 MG capsule Take 1 capsule (40 mg total) by mouth daily.  . Multiple Vitamin (MULTIVITAMIN WITH MINERALS) TABS tablet Take 1 tablet by  mouth daily.  . norethindrone-ethinyl estradiol-iron (JUNEL FE 1.5/30) 1.5-30 MG-MCG tablet Take 1 tablet by mouth daily.  Marland Kitchen OLANZapine (ZYPREXA) 2.5 MG tablet TAKE 1/2 TABLET (1.25 MG TOTAL) BY MOUTH AT BEDTIME.  . polyethylene glycol (MIRALAX / GLYCOLAX) packet Take 17 g by mouth daily.  . ranitidine (ZANTAC) 150 MG tablet TAKE 1 TABLET BY MOUTH 2 TIMES DAILY.  . SUMAtriptan (IMITREX) 25 MG tablet Take 25 mg by mouth every 2 (two) hours as needed for migraine. May repeat in 2 hours if headache persists or recurs.  Marland Kitchen TROKENDI XR 200 MG CP24 Take 1 capsule daily  . [DISCONTINUED] TROKENDI XR 50 MG CP24 Take 1 capsule daily along with your Topiramate ER  to equal  per day   No facility-administered encounter medications on file as of 08/23/2015.     Patient Active Problem List   Diagnosis Date Noted  . Concussion with loss of consciousness 07/23/2015  . Adjustment disorder with mixed anxiety and depressed mood 07/13/2015  . Chronic bilateral lower abdominal pain 05/24/2015  . Anorexia nervosa 04/29/2015  . Malnutrition of mild degree [name]: 75% to less than 90% of standard weight) (HCC) 04/29/2015  . Postural orthostatic tachycardia syndrome 09/26/2014  . Migraine without aura 10/30/2012  . Episodic tension type headache 10/30/2012  . Other specified cardiac dysrhythmias(427.89) 10/30/2012  . 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 She lives with her parents and siblings. She enjoys school, shopping,watching TV, drinking decaf coffee and helping with children at church.     Review of Systems  Constitutional: Negative for fever and chills.  Eyes: Negative.   Respiratory: Negative.   Cardiovascular: Negative.   Gastrointestinal: Positive for abdominal pain. Negative for nausea, vomiting and blood in stool.       Acid regurgitation   Skin: Negative.   Neurological: Positive  for dizziness. Negative for tingling, tremors, sensory change, focal weakness and seizures.       Concussions symptoms improving slowly - no longer having daily headaches  Endo/Heme/Allergies: Negative.   Psychiatric/Behavioral: Negative for suicidal ideas and substance abuse.   The following portions of the patient's history were reviewed and updated as appropriate: allergies, current medications, past family history, past medical history, past social history, past surgical history and problem list.  Physical Exam:  Filed Vitals:   08/23/15 0844 08/23/15 0852 08/23/15 0853  BP:  99/62 100/70  Pulse:  71 91  Height: 5' 5.35" (1.66 m)    Weight: 133 lb 6.4 oz (60.51 kg)     Ht 5' 5.35" (1.66 m)  Wt 133 lb 6.4 oz (60.51 kg)  BMI 21.96 kg/m2  LMP 07/27/2015 Body mass index: body mass index is 21.96 kg/(m^2). Facility age limit for growth percentiles is 20 years.  Wt Readings from Last 3 Encounters:  08/23/15 133 lb 6.4 oz (60.51 kg)  08/18/15 133 lb 3.2 oz (60.419 kg)  08/04/15 133 lb 12.8 oz (60.691 kg)   Wt Readings from Last 3 Encounters:  08/23/15 133 lb 6.4 oz (60.51 kg)  08/18/15 133 lb 3.2 oz (60.419 kg)  08/04/15 133 lb 12.8 oz (60.691 kg)   Temp Readings from Last 3 Encounters:  07/28/15 98.8 F (37.1 C)   07/15/15 98.8 F (37.1 C)   10/26/14 98.3 F (36.8 C) Oral   BP Readings from Last 3 Encounters:  08/18/15 90/60  08/04/15 109/74  07/28/15 95/54   Pulse Readings from Last 3 Encounters:  08/18/15 84  08/04/15 81  07/28/15 86     Physical Exam  Constitutional: She is oriented to person, place, and time. No distress.  HENT:  Head: Normocephalic and atraumatic.  Mouth/Throat: No oropharyngeal exudate.  erythematous oropharynx   Eyes: EOM are normal. Pupils are equal, round, and reactive to light. No scleral icterus.  Neck: Normal range of motion. Neck supple. No thyromegaly present.  Cardiovascular: Normal rate, regular rhythm and intact distal  pulses.   No murmur heard. Pulmonary/Chest: Effort normal and breath sounds normal.  Abdominal: Soft.  Musculoskeletal: Normal range of motion. She exhibits no edema.  Lymphadenopathy:    She has no cervical adenopathy.  Neurological: She is alert and oriented to person, place, and time. No cranial nerve deficit. Coordination normal.  Skin: Skin is warm and dry. No rash noted. She is not diaphoretic.  Psychiatric:  Flat affect   Nursing note and vitals reviewed.    Assessment/Plan: 1. Anorexia nervosa -discussed concerns over breakfast exchange difficulties -of note, she is taking topamax 200 mg, which has an appetite suppressant concern and could be concerning for increased dizziness.  -Would not push the concern of exchanges too much in the context of this medication dose.  Once this dose is decreased by Neuro, likely may see increase in ease of exchanges.  -stable nutritional status today; will see again in 2 weeks to monitor if symptomatic with menses and also to follow  up on cardiology from today.   2. 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   3. Gastroesophageal reflux disease, esophagitis presence not specified -continues to have issues with acid regurgitation despite Zantac use -consider GI referral if no improvement   4. Screening for genitourinary condition - reviewed, WNL  -POCT urinalysis dipstick   Follow-up:  Return in about 2 weeks (around 09/06/2015) for DE management.   Medical decision-making:  > 25 minutes spent, more than 50% of appointment was spent discussing diagnosis and management of symptoms

## 2015-08-23 NOTE — Telephone Encounter (Signed)
VM from mom. States that she was calling to update [place] after cardiology appt today. Mom would like to be called back to discuss OV. Mom can be reached at [phone].

## 2015-08-24 ENCOUNTER — Encounter: Payer: BLUE CROSS/BLUE SHIELD | Admitting: *Deleted

## 2015-08-24 ENCOUNTER — Encounter: Payer: Self-pay | Admitting: *Deleted

## 2015-08-24 ENCOUNTER — Other Ambulatory Visit: Payer: Self-pay | Admitting: Family

## 2015-08-24 DIAGNOSIS — F509 Eating disorder, unspecified: Secondary | ICD-10-CM

## 2015-08-24 NOTE — Progress Notes (Signed)
Appointment start time: 0815  Appointment end time: 0845  Patient was seen on 08/23/15 for nutrition counseling pertaining to disordered eating.  She is accompanied by her boyfriend  Primary care provider: Dr. Rana Snare Therapist: Noni Saupe, weekly    Any other medical team members: Dr. Marina Goodell Parents: [name]  Assessment:   Just came from getting extensice lab work.  Cards ordered 21 tests.  Due to her symptoms, cards wants to ensure nothing else is going on   States she is trying to eat more, but life has gotten in the way.  Like yesterday they gave her the wrong nuggets at [employer] and at church lunch there was little food.  She states she just decided to follow the meal plan fully and try her hardest.  And she did.   Home BP are not great.  Dizziness yesterday.  Some GI upset, but admits her symptoms are improving slowly.  Still has headaches, but admits symptoms are improving.  States when she started Topamax is when she started loosing weight and her ED started....   Growth Metrics: Ideal BMI for age: 34.6 BMI 08/23/15: 22.17 % Ideal:  100% Previous growth data: weight/age  54-90th%; height/age at 75%; BMI/age 17-85% Goal BMI range based on growth chart data: 22-25 Goal weight range based on growth chart data: 130-145 lb No further weight restoration needed.  However, she is still restricting/appetite suppressed with Topomax.  When she eats intuitively, she will most likely gain a little more weight   Mental health diagnosis: AN, B/P subtype   Dietary assessment: mom says she's always been picky; currently following gluten-free diet  Safe foods include: salad with minimal dressing, fruits, oatmeal, rice chex Avoided foods include:desserts, meat, pasta, fast food, chips, pizza, bread, all other potatoes besides baked   Friday B: luna and CIB L: sweet pot caserole, black bean and corn salsa chips D:tomoato soup and salad from [employer] S: gluten free cup cake  Saturday B:  bowl and 1/2 cheerios L: Smoothie (juice shop) S: Black bean salad and chips D: Chicken special S: CIB  Sunday B:Granola bar and CIB L: 4 green beans, roasted potatoes, roasted [place] (food was gone) S:Yogurt and 4 ginger snaps D:tomato soup and [employer] S:dunkin and luna bar  Monday B:luna bar, CIB Coffee L: Fruit and kind bar (chick fil a messed up) and sprite S: Propel apple and peanut butter D: Spinach slaad with chicken, olives, cheese, honey mustard 10 oz water S: yogurt and granola  Estimated energy intake: 1400 kcal  Estimated energy needs: 2200 kcal for weight restoration    275 g CHO 110 g pro 73 g fat  Nutrition Diagnosis: NI-1.4 Inadequate energy intake As related to restricting and purging.  As evidenced by weight loss of 16 pounds .  Intervention/Goals: Nutrition counseling provided. Keep up great work!  Challenged cognitive distortions.  Encouraged her to challenge Ed thoughts at home.  Discussed "going on offensive" against Ed to get her life back.    Still need adequate fluids, and still need to eat adequate starches, fats, proteins.    Advised following meal plan despite fullness as internal cues are not yet regulated (and Topomax).   Goals Snack ideas: Yogurt and granola [employer] protein bars Research officer, [employer] and gluten-free crackers 6 Tortilla chips and 1/2 cup guacamole  Gluten free ginger snaps   Use water app to remind you to drink more! 5 eating occasions!!!!!!- breakfast needs 2 items, snacks need 2 macronutrients  (  carb and protein or fat) Plain coffee with syrup doesn't count as a snack; coffee drink does count as a snack Keep up good with veggies and fruit Keep working on gluten-free grains.... Could have gluten-free pasta!!!  Challenge Ed at home- do your studying, don't just go to class.!!   Monitoring and Evaluation: Patient will follow up in 2 weeks

## 2015-08-25 ENCOUNTER — Telehealth: Payer: Self-pay | Admitting: Family

## 2015-08-25 NOTE — Telephone Encounter (Signed)
I called and talked to Mom to see how Jessica [name] was doing and about outcome of cardiology visit yesterday. Mom said that cardiology recommended taking Atenolol  1/2 tablet twice per day since she had responded well to dose of  daily in the past. She said that Jessica [name] had labs done at cardiology office and that 12 vials of blood were obtained. She said that Jessica [name] complained of severe headache after that and had to lie down for awhile. Over all, Mom said that Jessica [name] complains of a headache most days but that it is not severe. She is taking the Topiramate ER  daily. She took Topiramate ER for a couple of weeks right after the head injury in December but then reduced the dose back to . She said that Jessica [name] is dizzy each time she gets up from standing or lying position but that she has not had further syncopal episodes. Mom said that Jessica [name] averaged 40 oz of fluid intake daily. She tries to drink more but has difficulty because she doesn't like to drink so much plain water. I told Mom that it is ok for Jessica [name] to flavor the water with lemon or things such as Crystal Light to make it more palatable. I asked Mom to continue remind Jessica [name] of the importance of hydration with her condition, and asked her to let me know if she has any questions or concerns before her next visit later this month. Jessica [name]

## 2015-08-25 NOTE — Telephone Encounter (Signed)
Thank you for you note I reviewed it and agree with this plan.

## 2015-08-25 NOTE — Telephone Encounter (Signed)
TC to mom. Discussed that cardiology notes had been reviewed and agreed with exercise and med change recommendations. Also told mom that I had been in correspondence with Elveria Rising re: Topamax dose changes. Mom inquired about lab tests ordered through cardiology and I explained that he is testing for conditions which often mimic cardiology etiologies. Mom verbalized understanding, no further questions.

## 2015-09-06 ENCOUNTER — Ambulatory Visit (INDEPENDENT_AMBULATORY_CARE_PROVIDER_SITE_OTHER): Payer: BLUE CROSS/BLUE SHIELD | Admitting: Family

## 2015-09-06 ENCOUNTER — Encounter: Payer: Self-pay | Admitting: *Deleted

## 2015-09-06 ENCOUNTER — Encounter: Payer: BLUE CROSS/BLUE SHIELD | Attending: Pediatrics | Admitting: *Deleted

## 2015-09-06 ENCOUNTER — Encounter: Payer: Self-pay | Admitting: Family

## 2015-09-06 VITALS — BP 98/65 | HR 89 | Ht 65.35 in | Wt 133.2 lb

## 2015-09-06 DIAGNOSIS — Z1389 Encounter for screening for other disorder: Secondary | ICD-10-CM | POA: Diagnosis not present

## 2015-09-06 DIAGNOSIS — Z713 Dietary counseling and surveillance: Secondary | ICD-10-CM | POA: Insufficient documentation

## 2015-09-06 DIAGNOSIS — F5000 Anorexia nervosa, unspecified: Secondary | ICD-10-CM

## 2015-09-06 DIAGNOSIS — R000 Tachycardia, unspecified: Secondary | ICD-10-CM

## 2015-09-06 DIAGNOSIS — I951 Orthostatic hypotension: Secondary | ICD-10-CM | POA: Diagnosis not present

## 2015-09-06 DIAGNOSIS — F4323 Adjustment disorder with mixed anxiety and depressed mood: Secondary | ICD-10-CM

## 2015-09-06 DIAGNOSIS — F509 Eating disorder, unspecified: Secondary | ICD-10-CM | POA: Insufficient documentation

## 2015-09-06 DIAGNOSIS — G90A Postural orthostatic tachycardia syndrome (POTS): Secondary | ICD-10-CM

## 2015-09-06 LAB — POCT URINALYSIS DIPSTICK
Bilirubin, UA: NEGATIVE
Blood, UA: NEGATIVE
Glucose, UA: NEGATIVE
Ketones, UA: NEGATIVE
NITRITE UA: NEGATIVE
PH UA: 8
PROTEIN UA: NEGATIVE
Spec Grav, UA: <=1.005
Urobilinogen, UA: NEGATIVE

## 2015-09-06 NOTE — Progress Notes (Signed)
THIS RECORD MAY CONTAIN CONFIDENTIAL INFORMATION THAT SHOULD NOT BE RELEASED WITHOUT REVIEW OF THE SERVICE PROVIDER.  Adolescent Medicine Consultation Follow-Up Visit Jessica [name]  is a 20 y.o. female referred by Jessica Mast, MD here today for follow-up.    Previsit planning completed:  no  Growth Chart Viewed? yes   History was provided by the patient.  PCP Confirmed?  Yes, Jessica [name]. MD   My Chart Activated?   yes   HPI:   Vaginal spotting around 08/25/15 for about 3 days.  Tries hard to get the 5 meals in; sometimes late lunch, then dinner at 5, she will not have an afternoon snack.  Headaches and dizziness about the same. Sees Neuro on 09/20/15. Sees cardiology again in 6 months.  Topamax 150 mg daily now.  Taking 1/2 of 2.5 mg Zyprexa.   Cortisol level and T3 abnormal - [place] recommended Endocrinology.  15 minutes of exercise daily recommended by cardiology. Joined [facility]. Interested in yoga and eliptical.  Was a swimmer, finds that reduces anxiety also.   Patient's last menstrual period was 07/27/2015. Allergies  Allergen Reactions  . Amoxicillin Rash   Outpatient Encounter Prescriptions as of 09/06/2015  Medication Sig Note  . atenolol (TENORMIN) 25 MG tablet Take 0.5 tablets (12.5 mg total) by mouth daily.   . Calcium-Vitamin D-Vitamin K (VIACTIV PO) Take 1 tablet by mouth daily.    . fludrocortisone (FLORINEF) 0.1 MG tablet Take 1 tablet by mouth daily   . FLUoxetine (PROZAC) 40 MG capsule Take 1 capsule (40 mg total) by mouth daily.   . Multiple Vitamin (MULTIVITAMIN WITH MINERALS) TABS tablet Take 1 tablet by mouth daily.   . norethindrone-ethinyl estradiol-iron (JUNEL FE 1.5/30) 1.5-30 MG-MCG tablet Take 1 tablet by mouth daily.   Marland Kitchen OLANZapine (ZYPREXA) 2.5 MG tablet TAKE 1/2 TABLET (1.25 MG TOTAL) BY MOUTH AT BEDTIME.   . polyethylene glycol (MIRALAX / GLYCOLAX) packet Take 17 g by mouth daily.   . ranitidine (ZANTAC) 150 MG tablet TAKE 1 TABLET BY MOUTH 2 TIMES  DAILY.   . SUMAtriptan (IMITREX) 25 MG tablet Take 25 mg by mouth every 2 (two) hours as needed for migraine. May repeat in 2 hours if headache persists or recurs.   . Topiramate ER 150 MG CS24  08/25/2015: Received from: [facility]   No facility-administered encounter medications on file as of 09/06/2015.     Patient Active Problem List   Diagnosis Date Noted  . Concussion with loss of consciousness 07/23/2015  . Adjustment disorder with mixed anxiety and depressed mood 07/13/2015  . Chronic bilateral lower abdominal pain 05/24/2015  . Anorexia nervosa 04/29/2015  . Malnutrition of mild degree [name]: 75% to less than 90% of standard weight) (HCC) 04/29/2015  . Postural orthostatic tachycardia syndrome 09/26/2014  . Migraine without aura 10/30/2012  . Episodic tension type headache 10/30/2012  . Other specified cardiac dysrhythmias(427.89) 10/30/2012  . 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 She lives with her parents and siblings. She enjoys school, shopping,watching TV, drinking decaf coffee and helping with children at church.      PHQ-SADS Completed on: 09/06/15 PHQ-15:  16 GAD-7:  5 PHQ-9:  7 Reported problems make it somewhat difficult to complete activities of daily functioning.  The following portions of the patient's history were reviewed and updated as appropriate: allergies, current medications, past family history, past  medical history, past social history, past surgical history and problem list.  Physical Exam:  Filed Vitals:   09/06/15 0903 09/06/15 0917  BP: 88/56 98/65  Pulse: 72 89  Height: 5' 5.35" (1.66 m)   Weight: 133 lb 2.5 oz (60.4 kg)    Ht 5' 5.35" (1.66 m)  Wt 133 lb 2.5 oz (60.4 kg)  BMI 21.92 kg/m2  LMP 07/27/2015 Body mass index: body mass index is 21.92 kg/(m^2). Facility age limit for growth percentiles is 20  years.  Physical Exam  Constitutional: She is oriented to person, place, and time. No distress.  HENT:  Head: Normocephalic and atraumatic.  Mouth/Throat: Oropharynx is clear and moist.  Eyes: EOM are normal. Pupils are equal, round, and reactive to light. No scleral icterus.  Neck: Normal range of motion. Neck supple. No thyromegaly present.  Cardiovascular: Normal rate, regular rhythm, normal heart sounds and intact distal pulses.   No murmur heard. Pulmonary/Chest: Effort normal and breath sounds normal.  Abdominal: Soft.  Musculoskeletal: Normal range of motion. She exhibits no edema.  Lymphadenopathy:    She has no cervical adenopathy.  Neurological: She is alert and oriented to person, place, and time. No cranial nerve deficit.  Skin: Skin is warm and dry. No rash noted.  Psychiatric: She has a normal mood and affect. Her behavior is normal. Judgment and thought content normal.    Assessment/Plan: 1. Anorexia nervosa -Discussed difficulty with meeting 5 meals/day; completed PHQSADs in order to assess if anxiety component.  Does not appear to be related to anxiety around meal times; Appears to be more related to timing issues (waking later) or circumstances where food choices are limited (church incident).   2. Adjustment disorder with mixed anxiety and depressed mood -Reviewed PHQSADs. Continue with current regimen of Prozac and Zyprexa.   3. Screening for genitourinary condition -WNL - POCT urinalysis dipstick  4. Postural orthostatic tachycardia syndrome -Discussed exercise and to continue with POC advised by cardiology of 15 minutes/day.  -Reviewed that swimming 15 minutes is not the equivalent of 15 min of other cardio; cautioned re: swimming 15 minutes; suggested she maintain a log of exercise, food intake. Will reassess PHQSADS and food exchanges as exercise is added.  -Will need to review with Vernona Rieger if additional intake or change in exchanges is necessary with  additional caloric expenditure.   Referral to endocrinology for follow-up on labs? PCP or referral from cardiology? - confirm    Follow-up:  Return in about 2 weeks (around 09/20/2015) for DE management.   Medical decision-making:  > 25 minutes spent, more than 50% of appointment was spent discussing diagnosis and management of symptoms

## 2015-09-06 NOTE — Progress Notes (Signed)
Appointment start time: 0800  Appointment end time: 0845  Patient was seen on 09/06/15 for nutrition counseling pertaining to disordered eating.  She is accompanied by her boyfriend  Primary care provider: Dr. Rana Snare Therapist: Noni Saupe, weekly    Any other medical team members: Dr. Marina Goodell Parents: [name]  Assessment:   Headaches and dizziness about the same. Was found to have high cortisol and has been advised to see endocrinology.  T3 also elevated Is trying to get adequate water in; not so much yesterday. Abdominal pain about the same; would like referral to GI Just joined [facility].  20 minutes on elliptical.  Is interested in swimming and yoga classes   Growth Metrics: Ideal BMI for age: 92.6 BMI 09/06/15: 21.92 % Ideal:  100% Previous growth data: weight/age  2-90th%; height/age at 75%; BMI/age 64-85% Goal BMI range based on growth chart data: 22-25 Goal weight range based on growth chart data: 130-145 lb No further weight restoration needed.  However, she is still restricting/appetite suppressed with Topomax.  When she eats intuitively, she will most likely gain a little more weight   Mental health diagnosis: AN, B/P subtype   Dietary assessment: mom says she's always been picky; currently following gluten-free diet  Safe foods include: salad with minimal dressing, fruits, oatmeal, rice chex Avoided foods include:desserts, meat, pasta, fast food, chips, pizza, bread, all other potatoes besides baked  Feels like her eating is good.  Had ice cream yesterday 24 hour recall B: luna bar, coffee, water L: brat and salad S: kids scoops chocolate ice cream, banana D: 2 bowls frosted cheerios S: luna bar  Saturday B: frosted cheerios L: greek chicken with quinoa S: luna bar L: 3 slices french toast S: 3 marshmellows, chocoalte, Luna bar Water  Friday B: luna bar, bananan, water L: oatmeal with pecans, cookie S: none D: greek chicken with quinoa and corn S: gluten  free cupcake  wednesday B: luna bar and banana L: dry cheerios S: yogurt with granola D: fruit, mac-n-cheese, 1/2 deviled eggs, corn, dump cake S: banana with nutella Water   Estimated energy intake: 1400 kcal  Estimated energy needs: 2200 kcal for weight restoration    275 g CHO 110 g pro 73 g fat  Nutrition Diagnosis: NI-1.4 Inadequate energy intake As related to restricting and purging.  As evidenced by weight loss of 16 pounds .  Intervention/Goals: Nutrition counseling provided. Keep up great work!  If she wants to live independently, she needs to take more responsibility for her care.  Suggested she start keeping her food logs instead of her mom. Reiterated need to follow meal plan in its entirely and drink adequate fluids.  Follow up with medical provider about exercise  Monitoring and Evaluation: Patient will follow up in 2 weeks

## 2015-09-17 ENCOUNTER — Encounter: Payer: Self-pay | Admitting: Pediatrics

## 2015-09-17 NOTE — Progress Notes (Signed)
Pre-Visit Planning  Jessica [name]  is a 20 y.o. female referred by Norman Clay, MD.   Last seen in [facility] on 09/06/15 for DE, POTS.   Previous Psych Screenings? Yes  PHQ-SADS Completed on: 09/06/15 PHQ-15: 16 GAD-7: 5 PHQ-9: 7 Reported problems make it somewhat difficult to complete activities of daily functioning.  Treatment plan at last visit included continuing zyprexa 2.5 mg, discussing referral to cardiology.   Clinical Staff Visit Tasks:   - Urine GC/CT due? no - Psych Screenings Due? No - DE with EVS   Provider Visit Tasks: - discuss endo referral- place referral if not done  - monitor exercise and intake  - [facility] Involvement? No - Pertinent Labs? No

## 2015-09-20 ENCOUNTER — Ambulatory Visit (INDEPENDENT_AMBULATORY_CARE_PROVIDER_SITE_OTHER): Payer: BLUE CROSS/BLUE SHIELD | Admitting: Family

## 2015-09-20 ENCOUNTER — Ambulatory Visit: Payer: Self-pay | Admitting: Family

## 2015-09-20 ENCOUNTER — Encounter: Payer: Self-pay | Admitting: Family

## 2015-09-20 ENCOUNTER — Ambulatory Visit: Payer: BLUE CROSS/BLUE SHIELD | Admitting: Family

## 2015-09-20 ENCOUNTER — Ambulatory Visit (INDEPENDENT_AMBULATORY_CARE_PROVIDER_SITE_OTHER): Payer: BLUE CROSS/BLUE SHIELD | Admitting: Pediatrics

## 2015-09-20 ENCOUNTER — Encounter: Payer: Self-pay | Admitting: Pediatrics

## 2015-09-20 VITALS — BP 119/78 | HR 83 | Ht 65.0 in | Wt 133.2 lb

## 2015-09-20 VITALS — BP 90/64 | HR 88 | Ht 65.0 in | Wt 132.0 lb

## 2015-09-20 DIAGNOSIS — R000 Tachycardia, unspecified: Secondary | ICD-10-CM | POA: Diagnosis not present

## 2015-09-20 DIAGNOSIS — I951 Orthostatic hypotension: Secondary | ICD-10-CM

## 2015-09-20 DIAGNOSIS — F5000 Anorexia nervosa, unspecified: Secondary | ICD-10-CM

## 2015-09-20 DIAGNOSIS — G90A Postural orthostatic tachycardia syndrome (POTS): Secondary | ICD-10-CM

## 2015-09-20 DIAGNOSIS — K5901 Slow transit constipation: Secondary | ICD-10-CM

## 2015-09-20 DIAGNOSIS — G43009 Migraine without aura, not intractable, without status migrainosus: Secondary | ICD-10-CM

## 2015-09-20 DIAGNOSIS — F4323 Adjustment disorder with mixed anxiety and depressed mood: Secondary | ICD-10-CM

## 2015-09-20 DIAGNOSIS — R947 Abnormal results of other endocrine function studies: Secondary | ICD-10-CM

## 2015-09-20 DIAGNOSIS — Z1389 Encounter for screening for other disorder: Secondary | ICD-10-CM

## 2015-09-20 DIAGNOSIS — G44219 Episodic tension-type headache, not intractable: Secondary | ICD-10-CM | POA: Diagnosis not present

## 2015-09-20 DIAGNOSIS — S060X1S Concussion with loss of consciousness of 30 minutes or less, sequela: Secondary | ICD-10-CM | POA: Diagnosis not present

## 2015-09-20 LAB — POCT URINALYSIS DIPSTICK
Bilirubin, UA: NEGATIVE
Blood, UA: NEGATIVE
GLUCOSE UA: NEGATIVE
KETONES UA: NEGATIVE
Leukocytes, UA: NEGATIVE
Nitrite, UA: NEGATIVE
Protein, UA: NEGATIVE
Urobilinogen, UA: NEGATIVE
pH, UA: 6.5

## 2015-09-20 MED ORDER — DOCUSATE SODIUM 100 MG PO CAPS: 100.0000 mg | ORAL_CAPSULE | Freq: Every day | ORAL | Status: DC

## 2015-09-20 NOTE — Progress Notes (Signed)
THIS RECORD MAY CONTAIN CONFIDENTIAL INFORMATION THAT SHOULD NOT BE RELEASED WITHOUT REVIEW OF THE SERVICE PROVIDER.  Adolescent Medicine Consultation Follow-Up Visit Jessica [name]  is a 20 y.o. female referred by Jessica Mast, MD here today for follow-up.    Previsit planning completed:  Yes  Pre-Visit Planning  Jessica [name] is a 20 y.o. female referred by Jessica Clay, MD.  Last seen in [facility] on 09/06/15 for DE, POTS.   Previous Psych Screenings? Yes  PHQ-SADS Completed on: 09/06/15 PHQ-15: 16 GAD-7: 5 PHQ-9: 7 Reported problems make it somewhat difficult to complete activities of daily functioning.  Treatment plan at last visit included continuing zyprexa 2.5 mg, discussing referral to cardiology.   Clinical Staff Visit Tasks:  - Urine GC/CT due? no - Psych Screenings Due? No - DE with EVS   Provider Visit Tasks: - discuss endo referral- place referral if not done  - monitor exercise and intake  - [facility] Involvement? No - Pertinent Labs? No  Growth Chart Viewed? yes   History was provided by the patient.  PCP Confirmed?  yes  My Chart Activated?   yes   HPI:    Saw neurology today. F/u in 2 months. Continue pushing fluids.  Dizziness is the usual; headaches seem some better but not great. About 3x/week. Memory is much better.  Swam at the [facility]- did 1500 m. 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 Dad went with her to monitor.   Did some yoga too. Is thinking about doing Zumba with family.  She is keeping up with her own food. She has it on a note in her phone. Mom's last recording was 2/14. Not been monitoring stomach pains.   Review of Systems  Constitutional: Negative for weight loss and malaise/fatigue.  Eyes: Negative for blurred vision.  Respiratory: Negative for shortness of breath.   Cardiovascular: Negative for chest pain and palpitations.  Gastrointestinal: Negative for nausea, vomiting, abdominal pain  and constipation.  Genitourinary: Negative for dysuria.  Musculoskeletal: Negative for myalgias.  Neurological: Positive for dizziness and headaches.  Psychiatric/Behavioral: Negative for depression.     Patient's last menstrual period was 08/23/2015 (exact date). Allergies  Allergen Reactions  . Amoxicillin Rash   Outpatient Encounter Prescriptions as of 09/20/2015  Medication Sig Note  . atenolol (TENORMIN) 25 MG tablet Take 0.5 tablets (12.5 mg total) by mouth daily.   . Calcium-Vitamin D-Vitamin K (VIACTIV PO) Take 1 tablet by mouth daily.    . fludrocortisone (FLORINEF) 0.1 MG tablet Take 1 tablet by mouth daily   . FLUoxetine (PROZAC) 40 MG capsule Take 1 capsule (40 mg total) by mouth daily.   . Multiple Vitamin (MULTIVITAMIN WITH MINERALS) TABS tablet Take 1 tablet by mouth daily.   . norethindrone-ethinyl estradiol-iron (JUNEL FE 1.5/30) 1.5-30 MG-MCG tablet Take 1 tablet by mouth daily.   Marland Kitchen OLANZapine (ZYPREXA) 2.5 MG tablet TAKE 1/2 TABLET (1.25 MG TOTAL) BY MOUTH AT BEDTIME.   . polyethylene glycol (MIRALAX / GLYCOLAX) packet Take 17 g by mouth daily.   . ranitidine (ZANTAC) 150 MG tablet TAKE 1 TABLET BY MOUTH 2 TIMES DAILY.   . SUMAtriptan (IMITREX) 25 MG tablet Take 25 mg by mouth every 2 (two) hours as needed for migraine. May repeat in 2 hours if headache persists or recurs.   . Topiramate ER 150 MG CS24  08/25/2015: Received from: [facility]   No facility-administered encounter medications on file as of 09/20/2015.  Patient Active Problem List   Diagnosis Date Noted  . Concussion with loss of consciousness 07/23/2015  . Adjustment disorder with mixed anxiety and depressed mood 07/13/2015  . Chronic bilateral lower abdominal pain 05/24/2015  . Anorexia nervosa 04/29/2015  . Postural orthostatic tachycardia syndrome 09/26/2014  . Migraine without aura 10/30/2012  . Episodic tension type headache 10/30/2012  . Other specified cardiac  dysrhythmias(427.89) 10/30/2012  . 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 She lives with her parents and siblings. She enjoys school, shopping,watching TV, drinking decaf coffee and helping with children at church.      The following portions of the patient's history were reviewed and updated as appropriate: allergies, current medications, past family history, past medical history, past social history and problem list.  Physical Exam:  Filed Vitals:   09/20/15 1443 09/20/15 1456 09/20/15 1457  BP:  101/60 119/78  Pulse:  64 83  Height:  (1.651 m)    Weight: 133 lb 3.2 oz (60.419 kg)     BP 119/78 mmHg  Pulse 83  Ht  (1.651 m)  Wt 133 lb 3.2 oz (60.419 kg)  BMI 22.17 kg/m2  LMP 08/23/2015 (Exact Date) Body mass index: body mass index is 22.17 kg/(m^2). Facility age limit for growth percentiles is 20 years.  Physical Exam  Constitutional: She is oriented to person, place, and time. She appears well-developed and well-nourished.  HENT:  Head: Normocephalic.  Neck: No thyromegaly present.  Cardiovascular: Normal rate, regular rhythm, normal heart sounds and intact distal pulses.   Pulmonary/Chest: Effort normal and breath sounds normal.  Abdominal: Soft. Bowel sounds are normal. There is no tenderness.  Musculoskeletal: Normal range of motion.  Neurological: She is alert and oriented to person, place, and time.  Skin: Skin is warm and dry.  Psychiatric: She has a normal mood and affect.     Assessment/Plan: 1. Adjustment disorder with mixed anxiety and depressed mood Continue zyprexa and prozac. She is doing really well.   2. Anorexia nervosa Continue with treatment team. We can continue to push out her visits as she proves she can keep up with exercise and intake.  We will do a nurse visit for weight before her dietitian visit in 2 weeks and provider visit here in 4  weeks.   3. Postural orthostatic tachycardia syndrome BP looks the best it has in a while today; HRs are stable. Discussed swimming and continuing to have a buddy with her or speaking to lifeguard before she swims to alert them of her condition in the even she needs help. Discussed endocrine labs that were obtained at [facility]. Total T3 is not concerning and elevated cortisol is likely because she was quite nervous that morning about the large amount of blood needed for the lab draw. Will repeat in 3 months to ensure no change.   4. Screening for genitourinary condition Results for orders placed or performed in visit on 09/20/15  POCT urinalysis dipstick  Result Value Ref Range   Color, UA light yellow    Clarity, UA clear    Glucose, UA neg    Bilirubin, UA neg    Ketones, UA neg    Spec Grav, UA <=1.005    Blood, UA neg    pH, UA 6.5    Protein, UA neg    Urobilinogen, UA negative    Nitrite, UA neg  Leukocytes, UA Negative Negative    - POCT urinalysis dipstick  5. Slow transit constipation Discussed constipation. Taking miralax daily. Will add colace daily to see if this improves.  - docusate sodium (COLACE) 100 MG capsule; Take 1 capsule (100 mg total) by mouth daily.  Dispense: 30 capsule; Refill: 6   Follow-up:  2 weeks for nurse visit; 4 weeks for provider visit.   Medical decision-making:  > 25 minutes spent, more than 50% of appointment was spent discussing diagnosis and management of symptoms

## 2015-09-20 NOTE — Progress Notes (Signed)
Patient: Jessica [name] MRN: [id number] Sex: female DOB: 1995/09/02  Provider: Elveria Rising, NP Location of Care: [facility]  Note type: Routine return visit  History of Present Illness: Referral Source: [name], MD History from: mother, patient and [facility] chart Chief Complaint: Head Injury F/U  Jessica [name] is a 20 y.o. girl with history of migraines, episodes of orthostatic hypotension, palpitations, syncope, and anorexia nervosa. She was last seen August 18, 2015 and returns for follow up today for a head injury that occurred on July 15, 2015. Jessica [name] has migraine without aura and neurally mediated syncope. She has vasovagal syncope with orthostatic intolerance that is similar to POTS. This has been treated with a combination of hydration, exercise, elevation of the head of her bed, atenolol given twice a day, midodrine every four hours during the daytime as needed for fatigue and Florinef. She has been taking Topiramate ER  for migraine prophylaxis, and was doing well until she fell and hit her head on July 15, 2015.   On that day, Gaynelle said that she was exiting her garage, when she tripped and fell, striking the back of her head and injuring her right arm. She said that she lost consciousness when she hit her head but is unsure how for what length of time. Mom took her to be evaluated in the ER, where she was told that she had a concussion, a large abrasion to her right elbow and a possible right wrist fracture. After that head injury, she experienced ongoing severe headache with nausea. Mom noted that Jessica [name] has some word finding and slower processing since the head injury. When she was last seen Jessica [name] reported that the headaches were improving, but still occurred on about half the days of the week. The headaches were less severe, and she was no longer experiencing nausea for most of the headaches.  Her mother reported that Jessica [name] continued to have some  word finding and slower processing skills. Jessica [name] was having some problems in her college coursework with reading comprehension and fatigue when reading. Jessica [name] was also experiencing more fatigue than usual and needed to nap every day to be able to function.   Today Jessica [name] says that while the headaches are ongoing that they are not as severe as they were in January. She has not missed any school or social activities related to headaches. She says that while one class is still difficult for her that she is managing better with the reading assignments. Mom feels that the word finding and slower processing of information has improved. Jessica [name] continues to complain of being more tired than usual but says that she does not have to nap every day.   Jessica [name] says that she has been trying to drink more water and averages about 50 oz of water per day. She sleeps 6-8 hours at night and has been trying to follow the prescribed exercise program given to her by her cardiologist as part of the POTS treatment. She would like to swim for exercise but says that she has been told not to do so other than swimming some laps if someone is monitoring her. She denies skipping meals.   Neither Jessica [name] nor her mother have other health concerns for her today other than previously mentioned.   Review of Systems: Please see the HPI for neurologic and other pertinent review of systems. Otherwise, the following systems are noncontributory including constitutional, eyes, ears, nose and throat, cardiovascular, respiratory, gastrointestinal, genitourinary, musculoskeletal, skin, endocrine,  hematologic/lymph, allergic/immunologic and psychiatric.   Past Medical History  Diagnosis Date  . POTS (postural orthostatic tachycardia syndrome)   . Migraine headache   . Syncope 09/22/2014   Hospitalizations: No., Head Injury: No., Nervous System Infections: No., Immunizations up to date: Yes.   Past Medical History Comments: She had onset of  headaches, some of them migraines beginning in the 4th or 5th grade. These involve stabbing left-sided pain that lasts for an hour before easing. The episodes are recurrent and are associated with nausea and vomiting, left sided throbbing, no sensitivity to light, sound, or movement. The patient recently had at least one episode of 10-15 minutes of tingling in her face and fingers associated with headaches. She also had episodes of what appeared to be positional vertigo, but became syncope. She was evaluated by Dr. Darlis Loan on April 08, 2012. He noted a previous evaluation, June 22, 2011 for syncope. EKG and echocardiogram were normal. 24-hour Holter monitor was normal. She had persistent episodes of tachycardia and dizziness following the evaluation. She has had initial improvement with Florinef; however, on 0.3 mg per day, she did not have persistent improvement. She complained of three episodes of loss of consciousness per month. On March 04, 2012, she had an episode of stiffening which was unusual. A decision was made to place her on atenolol when her pulse went up from 68 to 105 from supine to standing.  Surgical History Past Surgical History  Procedure Laterality Date  . Eye surgery  2000    clogged tear duct  . Anterior cruciate ligament repair  03/2010  . Wisdom tooth extraction      Family History family history includes Breast cancer in her maternal aunt; Cancer in her maternal aunt and maternal grandfather; Diabetes in her maternal grandfather and maternal grandmother; Eating disorder in her maternal aunt; Hypertension in her father; Kidney Stones in her father; Mental retardation in her other; Migraines in her maternal aunt, maternal uncle, and mother; Other in her other; Seizures in her father and mother; Stroke in her maternal grandfather. Family History is otherwise negative for migraines, seizures, cognitive impairment, blindness, deafness, birth defects, chromosomal disorder,  autism.  Social History Social History   Social History  . Marital Status: Single    Spouse Name: N/A  . Number of Children: N/A  . Years of Education: N/A   Social History Main Topics  . Smoking status: Never Smoker   . Smokeless tobacco: Never Used  . Alcohol Use: No  . Drug Use: No  . Sexual Activity: No   Other Topics Concern  . 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 She lives with her parents and siblings. She enjoys school, shopping,watching TV, drinking decaf coffee and helping with children at church.     Allergies Allergies  Allergen Reactions  . Amoxicillin Rash    Physical Exam BP 90/64 mmHg  Pulse 88  Ht  (1.651 m)  Wt 132 lb (59.875 kg)  BMI 21.97 kg/m2  LMP 08/23/2015 (Exact Date)  General: alert, well developed, well nourished girl, in no acute distress, right-handed, brown hair, brown eyes  Head: normocephalic, no dysmorphic features; no localized tenderness  Ears, Nose and Throat: Otoscopic: tympanic membranes normal . Pharynx: oropharynx is pink without exudates or tonsillar hypertrophy.  Neck: supple, full range of motion, no cranial or cervical bruits  Respiratory: auscultation clear  Cardiovascular: no murmurs, pulses are normal  Musculoskeletal:  no skeletal deformities or apparent scoliosis. She complains of pain with any movement of her right elbow and wrist.  Skin: no rashes or neurocutaneous lesions. She has a bandage on a large abrasion on her right elbow.  Neurologic Exam  Mental Status: alert; oriented to person, place, and year; knowledge is normal for age; language is normal  Cranial Nerves: visual fields are full to double simultaneous stimuli; extraocular movements are full and conjugate; pupils are round reactive to light; funduscopic examination shows sharp disc margins with normal vessels; symmetric facial strength; midline tongue and uvula; hearing is normal and symmetric   Motor: Normal strength, tone, and mass; good fine motor movements; no pronator drift.  Sensory: intact responses to touch and temperature  Coordination: good finger-to-nose, rapid repetitive alternating movements and finger apposition  Gait and Station: normal gait and station; patient is able to walk on heels, toes and tandem without difficulty; balance is adequate; Romberg exam is negative; Gower response is negative  Reflexes: symmetric and diminished bilaterally; no clonus; bilateral flexor plantar responses   Impression 1. History of concussion with loss of consciousness 2. Post-concussion headache 3. Migraine with aura, without status migranosus, not intractable 4. Postural orthostatic tachycardia syndrome 5. History of episodic tension type headaches 6. History of syncope 7. Anorexia nervosa  Recommendations for plan of care The [facility] were reviewed. Kamarri has neither had nor required imaging or lab studies since the last visit. She is a 20 year old young woman history of migraines, episodes of orthostatic hypotension, palpitations, syncope, anorexia nervosa and recent closed head injury. Rayel tripped and fell at her home on December 22nd, striking the occiptal region of her head and injuring her right arm. She had loss of consciousness when she hit her head, estimated to be 1-2 minutes. Initially she had severe headache with nausea, as well as some trouble with speech and processing. Her headaches, generalized fatigue and cognitive difficulties are slowly improving.  I reminded her of the need for increased hydration and rest as she recovers. Since she is showing improvement, I will see her back in follow up in 2 months or sooner if needed.   The medication list was reviewed and reconciled.  No changes were made in the prescribed medications today.  A complete medication list was provided to the patient.  Dr. Sharene Skeans was consulted regarding the patient.    Total time spent with the patient was 25 minutes, of which 50% or more was spent in counseling and coordination of care.

## 2015-09-20 NOTE — Patient Instructions (Signed)
Take colace (docusate sodium) 1 capsule a day.  We will see you for a nurse visit in 2 weeks  We will see you for a provider visit in 1 month

## 2015-09-20 NOTE — Patient Instructions (Signed)
Continue your medications as you have been taking them.  Remember that is is very important for you to be well hydrated, to sleep 8 hours at night and to avoid skipping meals.   It is ok for you to continue to rest some each day as you continue to recover from the concussion.   You should continue to engage in gentle exercise each day, as approved by your PCP and cardiologist.   Please plan to return for follow up in 2 months or sooner if needed.

## 2015-09-21 ENCOUNTER — Other Ambulatory Visit: Payer: Self-pay | Admitting: Pediatrics

## 2015-09-21 DIAGNOSIS — R947 Abnormal results of other endocrine function studies: Secondary | ICD-10-CM | POA: Insufficient documentation

## 2015-09-23 ENCOUNTER — Ambulatory Visit: Payer: BLUE CROSS/BLUE SHIELD | Admitting: *Deleted

## 2015-09-29 ENCOUNTER — Encounter: Payer: BLUE CROSS/BLUE SHIELD | Attending: Pediatrics | Admitting: *Deleted

## 2015-09-29 ENCOUNTER — Encounter: Payer: Self-pay | Admitting: *Deleted

## 2015-09-29 DIAGNOSIS — Z713 Dietary counseling and surveillance: Secondary | ICD-10-CM | POA: Insufficient documentation

## 2015-09-29 DIAGNOSIS — F509 Eating disorder, unspecified: Secondary | ICD-10-CM | POA: Diagnosis present

## 2015-09-29 DIAGNOSIS — F5000 Anorexia nervosa, unspecified: Secondary | ICD-10-CM

## 2015-09-29 NOTE — Patient Instructions (Signed)
Drink more water!!!!!! Use lemon or try lemon LaCroix or Dasani or Propel- 64 fluid ounces (to help you poop) On Zumba days, need additional 2 starches and 1 protein  2 pieces graham crackers with 2 tbsp peanut butter  luna bar and fruit  1/2 cur trail mix  10-12 bagel chips with 1 string cheese

## 2015-09-29 NOTE — Progress Notes (Signed)
Appointment start time: 0800  Appointment end time: 0845  Patient was seen on 09/29/15 for nutrition counseling pertaining to disordered eating.  She is accompanied by her boyfriend  Primary care provider: Dr. Rana SnareLowe Therapist: Noni SaupeHeather Kitchen, weekly    Any other medical team members: Dr. Marina GoodellPerry Parents: Bonnita LevanJoy Thomas  Assessment:   dizziness about the same, headaches are somewhat improved and will be seeing neuro less often. Was found to have high cortisol and has been advised not to see endocrinology.  T3 also elevated Is trying to get adequate water in; not so much yesterday. Abdominal pain about the same.  Just started Colace 1 week ago. Constipation is improving! BM every couple of days, but when she does move her bowels, she feels it's adequate Has been to yoga several times.Burgess Estelle.  Yesterday took 20 minute ab class and it was "death!"  But she had fun.  She really enjoys yoga.  Swam once (1500 m) and went with her dad and was closely watched by dad.  Felt good afterwards.  Wants to try zumba and would to talk about her meal plan to compensate for exercise.   Weight is fairly steady.  Dietary recall reveals 1400-1600 kcal and recommendation is ~2000-2200.  Weight is restored, but this provider feels she's still somewhat restrictive (intentionally or unintentionally due to Topamax)   Growth Metrics: Ideal BMI for age: 6021.6 BMI 09/20/15: 22.17 % Ideal:  100% Previous growth data: weight/age  67-90th%; height/age at 75%; BMI/age 17-85% Goal BMI range based on growth chart data: 22-25 Goal weight range based on growth chart data: 130-145 lb No further weight restoration needed.  However, she is still restricting/appetite suppressed with Topomax.  When she eats intuitively, she will most likely gain a little more weight   Mental health diagnosis: AN, B/P subtype   Dietary assessment: mom says she's always been picky; currently following gluten-free diet  Safe foods include: salad with minimal  dressing, fruits, oatmeal, rice chex Avoided foods include:desserts, meat, pasta, fast food, chips, pizza, bread, all other potatoes besides baked     24 hour recall B: luna bar and clementine L: small burrito bowl with chicken, brown rice, lettuce, sour cream and cheese S: yogurt with granola, ginger snaps D: enchilada casserole S: Luna bar Beverages: water (inadequate)  Monday L: luna, banana, propel L: grilled chicken, sweet potato casserole (8 oz water) S: raisins D: shrimp and avocado salad S: apple and nutella Beverages: inadequate fluids  Sunday B: luna and banana L: small piece chicken, carribean rice, salad.  Some water S: 1/2 graham cracker with icing and trail mix D: chicken and cheese enchilada (normal size).  propel S apple and nutella    Estimated energy intake: 1200-1600 kcal  Estimated energy needs: 2200 kcal for weight maintenance 275 g CHO 110 g pro 73 g fat  Nutrition Diagnosis: NI-1.4 Inadequate energy intake As related to restricting and purging.  As evidenced by weight loss of 16 pounds .  Intervention/Goals: Nutrition counseling provided. Stressed need for adequate fluids, in general, but especially if she's going to be engaging in more physical activity.  Discussed ways to increase exchanges on days she is more physically active.   Drink more water!!!!!! Use lemon or try lemon LaCroix or Dasani or Propel- 64 fluid ounces (to help you poop) On Zumba days, need additional 2 starches and 1 protein  2 pieces graham crackers with 2 tbsp peanut butter  luna bar and fruit  1/2 cur trail mix  10-12  bagel chips with 1 string cheese  Monitoring and Evaluation: Patient will follow up in 3 weeks.  Suggested home BP check in interim

## 2015-10-01 ENCOUNTER — Other Ambulatory Visit: Payer: Self-pay | Admitting: Pediatrics

## 2015-10-13 ENCOUNTER — Other Ambulatory Visit: Payer: Self-pay | Admitting: Pediatrics

## 2015-10-15 ENCOUNTER — Encounter: Payer: Self-pay | Admitting: Pediatrics

## 2015-10-15 NOTE — Progress Notes (Signed)
Pre-Visit Planning  Jessica KitchenCiara J [name]  is a 20 y.o. female referred by Jessica [name],Jessica V, MD.   [facility] on 09/20/15 for DE.   Previous Psych Screenings? Yes  Treatment plan at last visit included continue with current treatment team, discussed endocrine labs.   Clinical Staff Visit Tasks:   - Urine GC/CT due? no - Psych Screenings Due? Yes - PHQ-SADs, EAT 26  Provider Visit Tasks: - discuss exercise and current activity  - discuss eating patterns- possibly space out visits  - [facility] - Pertinent Labs? No- repeat TFTs and cortisol in 11/2015

## 2015-10-18 ENCOUNTER — Ambulatory Visit (INDEPENDENT_AMBULATORY_CARE_PROVIDER_SITE_OTHER): Payer: BLUE CROSS/BLUE SHIELD | Admitting: Pediatrics

## 2015-10-18 ENCOUNTER — Encounter: Payer: Self-pay | Admitting: Pediatrics

## 2015-10-18 VITALS — BP 115/75 | HR 68 | Ht 65.55 in | Wt 133.6 lb

## 2015-10-18 DIAGNOSIS — R000 Tachycardia, unspecified: Secondary | ICD-10-CM | POA: Diagnosis not present

## 2015-10-18 DIAGNOSIS — I951 Orthostatic hypotension: Secondary | ICD-10-CM | POA: Diagnosis not present

## 2015-10-18 DIAGNOSIS — F5000 Anorexia nervosa, unspecified: Secondary | ICD-10-CM

## 2015-10-18 DIAGNOSIS — Z1389 Encounter for screening for other disorder: Secondary | ICD-10-CM | POA: Diagnosis not present

## 2015-10-18 DIAGNOSIS — F4323 Adjustment disorder with mixed anxiety and depressed mood: Secondary | ICD-10-CM | POA: Diagnosis not present

## 2015-10-18 DIAGNOSIS — G90A Postural orthostatic tachycardia syndrome (POTS): Secondary | ICD-10-CM

## 2015-10-18 LAB — POCT URINALYSIS DIPSTICK
BILIRUBIN UA: NEGATIVE
Blood, UA: NEGATIVE
GLUCOSE UA: NEGATIVE
Ketones, UA: NEGATIVE
NITRITE UA: NEGATIVE
PH UA: 7
Protein, UA: NEGATIVE
Spec Grav, UA: 1.01
Urobilinogen, UA: NEGATIVE

## 2015-10-18 NOTE — Patient Instructions (Signed)
Continue current plan  Come see us in 1 month

## 2015-10-18 NOTE — Progress Notes (Addendum)
THIS RECORD MAY CONTAIN CONFIDENTIAL INFORMATION THAT SHOULD NOT BE RELEASED WITHOUT REVIEW OF THE SERVICE PROVIDER.  Adolescent Medicine Consultation Follow-Up Visit Jessica [name]  is a 20 y.o. female referred by Jessica Mast, MD here today for follow-up.    Previsit planning completed:  yes  Growth Chart Viewed? yes   History was provided by the patient and mother.  PCP Confirmed?  yes  My Chart Activated?   yes   HPI:    Things have been good. Hasn't been doing much exercise. School has been busy so not much time.  Headaches are about the same, not as much dizziness.   Been to yoga a few times  Constipation is doing better with stool softener.  Meal plan is going well.  She and mom overall feel like things are great and they don't have any particular questions.    EAT-26 10/18/2015  Total Score 18  Patient Report of Weight-Highest   Patient Report of Weight-Lowest   Patient Report of Weight-Ideal   Gone on eating binges where you feel that you may not be able to stop? Never  Ever made yourself sick (vomited) to control your weight or shape? Never  Ever used laxatives, diet pills or diuretics (water pills) to control your weight or shape? Never  Exercised more than 60 minutes a day to lose or to control your weight? Never  Lost 20 pounds or more in the past 6 months? No  PHQ-SADS 10/18/2015  PHQ-15 12  GAD-7 4  PHQ-9 5  Suicidal Ideation No  Comment Somewhat difficult     Review of Systems  Constitutional: Negative for weight loss and malaise/fatigue.  Eyes: Negative for blurred vision.  Respiratory: Negative for shortness of breath.   Cardiovascular: Negative for chest pain and palpitations.  Gastrointestinal: Positive for constipation. Negative for nausea, vomiting and abdominal pain.  Genitourinary: Negative for dysuria.  Musculoskeletal: Negative for myalgias.  Neurological: Positive for headaches. Negative for dizziness.  Psychiatric/Behavioral: Negative for  depression.     Patient's last menstrual period was 10/04/2015. Allergies  Allergen Reactions  . Amoxicillin Rash   Outpatient Encounter Prescriptions as of 10/18/2015  Medication Sig Note  . atenolol (TENORMIN) 25 MG tablet Take 0.5 tablets (12.5 mg total) by mouth daily.   . Calcium-Vitamin D-Vitamin K (VIACTIV PO) Take 1 tablet by mouth daily.    Marland Kitchen docusate sodium (COLACE) 100 MG capsule Take 1 capsule (100 mg total) by mouth daily.   . fludrocortisone (FLORINEF) 0.1 MG tablet Take 1 tablet by mouth daily   . FLUoxetine (PROZAC) 40 MG capsule TAKE 1 CAPSULE (40 MG TOTAL) BY MOUTH DAILY.   . Multiple Vitamin (MULTIVITAMIN WITH MINERALS) TABS tablet Take 1 tablet by mouth daily.   . norethindrone-ethinyl estradiol-iron (JUNEL FE 1.5/30) 1.5-30 MG-MCG tablet Take 1 tablet by mouth daily.   Marland Kitchen OLANZapine (ZYPREXA) 2.5 MG tablet TAKE 1/2 TABLET (1.25 MG TOTAL) BY MOUTH AT BEDTIME.   . polyethylene glycol (MIRALAX / GLYCOLAX) packet Take 17 g by mouth daily.   . ranitidine (ZANTAC) 150 MG tablet TAKE 1 TABLET BY MOUTH 2 TIMES DAILY.   . SUMAtriptan (IMITREX) 25 MG tablet Take 25 mg by mouth every 2 (two) hours as needed for migraine. May repeat in 2 hours if headache persists or recurs.   . Topiramate ER 150 MG CS24  08/25/2015: Received from: [facility]   No facility-administered encounter medications on file as of 10/18/2015.     Patient Active Problem  List   Diagnosis Date Noted  . Nonspecific abnormal results of endocrine function study 09/21/2015  . Concussion with loss of consciousness 07/23/2015  . Adjustment disorder with mixed anxiety and depressed mood 07/13/2015  . Chronic bilateral lower abdominal pain 05/24/2015  . Anorexia nervosa 04/29/2015  . Postural orthostatic tachycardia syndrome 09/26/2014  . Migraine without aura 10/30/2012  . Episodic tension type headache 10/30/2012  . Other specified cardiac dysrhythmias(427.89) 10/30/2012  . 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 She lives with her parents and siblings. She enjoys school, shopping,watching TV, drinking decaf coffee and helping with children at church.      The following portions of the patient's history were reviewed and updated as appropriate: allergies, current medications, past family history, past medical history, past social history and problem list.  Physical Exam:  Filed Vitals:   10/18/15 1408 10/18/15 1424 10/18/15 1426  BP:  99/64 115/75  Pulse:  68 68  Height: 5' 5.55" (1.665 m)    Weight: 133 lb 9.6 oz (60.6 kg)     BP 115/75 mmHg  Pulse 68  Ht 5' 5.55" (1.665 m)  Wt 133 lb 9.6 oz (60.6 kg)  BMI 21.86 kg/m2  LMP 10/04/2015 Body mass index: body mass index is 21.86 kg/(m^2). Facility age limit for growth percentiles is 20 years.  Physical Exam  Constitutional: She is oriented to person, place, and time. She appears well-developed and well-nourished.  HENT:  Head: Normocephalic.  Neck: No thyromegaly present.  Cardiovascular: Normal rate, regular rhythm, normal heart sounds and intact distal pulses.   Pulmonary/Chest: Effort normal and breath sounds normal.  Abdominal: Soft. Bowel sounds are normal. There is no tenderness.  Musculoskeletal: Normal range of motion.  Neurological: She is alert and oriented to person, place, and time.  Skin: Skin is warm and dry.  Psychiatric: She has a normal mood and affect.    Assessment/Plan: 1. Adjustment disorder with mixed anxiety and depressed mood Will continue same doses of medication. She is feeling well and feels like things are well controlled. PHQ-SADs similar to previous in the fall.   2. Anorexia nervosa Doing well with meal plan and adding exercise. She will see laura this week. Will continue to monitor for any concerns as she ramps up exercise. Continued improvement in EAT 26- 37-->28-->18.   3.  Postural orthostatic tachycardia syndrome Well controlled currently.   4. Screening for genitourinary condition Results for orders placed or performed in visit on 10/18/15  POCT urinalysis dipstick  Result Value Ref Range   Color, UA yellow    Clarity, UA cloudy    Glucose, UA neg    Bilirubin, UA neg    Ketones, UA neg    Spec Grav, UA 1.010    Blood, UA neg    pH, UA 7.0    Protein, UA neg    Urobilinogen, UA negative    Nitrite, UA neg    Leukocytes, UA Trace (A) Negative    Follow-up:  1 month   Medical decision-making:  > 15 minutes spent, more than 50% of appointment was spent discussing diagnosis and management of symptoms

## 2015-10-20 ENCOUNTER — Ambulatory Visit: Payer: BLUE CROSS/BLUE SHIELD | Admitting: *Deleted

## 2015-10-20 ENCOUNTER — Encounter: Payer: Self-pay | Admitting: *Deleted

## 2015-10-20 ENCOUNTER — Encounter: Payer: BLUE CROSS/BLUE SHIELD | Admitting: *Deleted

## 2015-10-20 DIAGNOSIS — F509 Eating disorder, unspecified: Secondary | ICD-10-CM | POA: Diagnosis not present

## 2015-10-20 NOTE — Progress Notes (Signed)
Appointment start time: 1330  Appointment end time: 1415  Patient was seen on 10/20/15 for nutrition counseling pertaining to disordered eating.  She is accompanied by her mother  Primary care provider: Dr. Rana SnareLowe Therapist: Noni SaupeHeather [name], weekly    Any other medical team members: Dr. 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 Hungry all the time, but does not eat anymore than meal plan.   Yoga once a week, but no other exercise as family isn't available to accompany her Had pizza a couple times. Is eating sweets, but pasta is still a challenge.  Follows meal plan ~97% of the time, but still struggles to get in adequate fluids Weight is fairly steady.  Dietary recall reveals 1200-1600 kcal and recommendation is ~2000-2200.  Weight is restored, but this provider feels she's still somewhat restrictive, intentionally (or unintentionally due to Topamax)   Growth Metrics: Ideal BMI for age: 1021.6 BMI current: : 22.17 % Ideal:  100% Previous growth data: weight/age  76-90th%; height/age at 75%; BMI/age 18-85% Goal BMI range based on growth chart data: 22-25 Goal weight range based on growth chart data: 130-145 lb No further weight restoration needed.  However, she is still restricting/appetite suppressed with Topomax.  When she eats intuitively, she will most likely gain a little more weight   Mental health diagnosis: AN, B/P subtype   Dietary assessment: mom says she's always been picky; currently following gluten-free diet    24 hour recall B: banana, oikos smoothie, clementine L: salsarita's small burito bowl with brown ric,e chicken, cheese, guac, veggies S: apple with nutella D: talapia, salad, fruit cup up S: yogurt covered risin and luna bar Beverages: 1/2 propel, insignificant water  B: fruity pebbles  L: grilled market salad form CFA S: apple with nutella D: 1/2 chipotle  S: special k bar Beverages: water  B: fruit, frosted cheerios L: salad  some fries S: graham crackers, nutella and PB D: salad and chicken S: sprite Beverages: more water ,sprite  B: luna bar, clementine, grapes L: small burito bowl (sometimes chips) S: banana bread D: pizza from pieology S: can't remember   Estimated energy intake: 1200-1600 kcal  Estimated energy needs: 2200 kcal for weight maintenance 275 g CHO 110 g pro 73 g fat  Nutrition Diagnosis: NI-1.4 Inadequate energy intake As related to restricting and purging.  As evidenced by weight loss of 16 pounds .  Intervention/Goals: Nutrition counseling provided. Stressed need for adequate fluids, challenge fear foods (pasta) and total adherence to meal plan.  Per [name], she can exercise alone, but not swim along.  Reminded her she needs additional snack on exercise days.  discussed honoring internal hunger cues and how metabolically the body makes changes in order to keep weight stable.  Advised honoring those cues.   She is to eat pasta weekly until she can conquer that fear     Monitoring and Evaluation: Patient will follow up in 3 weeks.

## 2015-10-20 NOTE — Patient Instructions (Signed)
Add in snack as needed for hunger :-) DRINK YOUR WATER! Ok to exercise alone, but not swim alone- need additional snack on exercise days Pasta every week for 3 weeks in a row

## 2015-10-22 ENCOUNTER — Telehealth: Payer: Self-pay | Admitting: Licensed Clinical Social Worker

## 2015-10-22 NOTE — Telephone Encounter (Signed)
Spoke to Jessica [name] and shared some details about an upcoming event that might interest her. Jessica [name] expressed some interest but declined. Wished her a good weekend.

## 2015-10-27 DIAGNOSIS — J069 Acute upper respiratory infection, unspecified: Secondary | ICD-10-CM | POA: Diagnosis not present

## 2015-10-27 DIAGNOSIS — B9789 Other viral agents as the cause of diseases classified elsewhere: Secondary | ICD-10-CM | POA: Diagnosis not present

## 2015-11-02 ENCOUNTER — Other Ambulatory Visit: Payer: Self-pay | Admitting: Pediatrics

## 2015-11-03 DIAGNOSIS — F5002 Anorexia nervosa, binge eating/purging type: Secondary | ICD-10-CM | POA: Diagnosis not present

## 2015-11-10 ENCOUNTER — Ambulatory Visit: Payer: BLUE CROSS/BLUE SHIELD | Admitting: *Deleted

## 2015-11-10 ENCOUNTER — Encounter: Payer: Self-pay | Admitting: *Deleted

## 2015-11-10 ENCOUNTER — Encounter: Payer: BLUE CROSS/BLUE SHIELD | Attending: Pediatrics | Admitting: *Deleted

## 2015-11-10 DIAGNOSIS — F509 Eating disorder, unspecified: Secondary | ICD-10-CM | POA: Insufficient documentation

## 2015-11-10 DIAGNOSIS — F5002 Anorexia nervosa, binge eating/purging type: Secondary | ICD-10-CM | POA: Diagnosis not present

## 2015-11-10 DIAGNOSIS — Z713 Dietary counseling and surveillance: Secondary | ICD-10-CM | POA: Diagnosis not present

## 2015-11-10 NOTE — Progress Notes (Signed)
Appointment start time: 0930  Appointment end time: 1000  Patient was seen on 11/10/15 for nutrition counseling pertaining to disordered eating.  She is accompanied by her mother  Primary care provider: Dr. Rana SnareLowe Therapist: Noni SaupeHeather Kitchen  Any other medical team members: Dr. Karlene EinsteinPerry/Adolescent medicine NPs Parents: Bonnita LevanJoy Thomas  Assessment:  Jessica FlesherWent to family's for Jessica LusterEaster and had gluten free cake, gluten free brownie, gluten free pasta salad, broccoli and cheese and rice casserole, sweet potato casserole Second Easter meal had ham, full deviled eggs, potato salad, fruit Anxiety was not affected by [facility] accomplished with eating pasta weekly: gluten free lasagna, pasta salad, spaghetti Was not affected by the pasta, but isn't sure she might eat it again; still slightly scary Hunger has calmed down a little.  Eating hasn't changed Has been to gym a few times and goes to yoga when she can.  Went to zoo with brother and walked a lot.  Was very tired.   Still struggling with fluids Has been brats and they're good. Stomach upsets are less.  Pain is still there, but not upset.  Didn't eat snack on exercise days    Growth Metrics: Ideal BMI for age: 7121.6 BMI current: : 22.17 % Ideal:  100% Previous growth data: weight/age  27-90th%; height/age at 75%; BMI/age 38-85% Goal BMI range based on growth chart data: 22-25 Goal weight range based on growth chart data: 130-145 lb No further weight restoration needed.  However, she is still restricting/appetite suppressed with Topomax.  When she eats intuitively, she will most likely gain a little more weight   Mental health diagnosis: AN, B/P subtype   Dietary assessment: mom says she's always been picky; currently following gluten-free diet    24 hour recall B: didn't eat clementine.  Did eat Luna bar L: brat, sweet potatoes Propel S: peanut butter graham and nutella graham D: chicken nachos with cheese, sour cream and some  water S: Luna and water  B: Luna and banana L: slasarita bowl and queso S banana and nuetella D: greek salad and didn't order chicken S: ice cream with cocoa pebbles  B: luna and clementine with water L: salsarita's bowl  S: apple with nutella D: greek salad with chicken S: grham cracker with peantu buter and brownie    Estimated energy intake: 1200-1600 kcal  Estimated energy needs: 2200 kcal for weight maintenance 275 g CHO 110 g pro 73 g fat  Nutrition Diagnosis: NI-1.4 Inadequate energy intake As related to restricting and purging.  As evidenced by weight loss of 16 pounds .  Intervention/Goals: Nutrition counseling provided. Stressed need for adequate fluids and snack on exercise days.  Praised progress.  Advised continuing to challenge fear foods    Monitoring and Evaluation: Patient will follow up in 3 weeks.

## 2015-11-10 NOTE — Patient Instructions (Signed)
Etheleen SiaAsli Schone, Systems analystpersonal trainer [phone] If interested   Eat snack on exercise day!!!!!!!!!!!!!!!!!!!!!!!!!!!!!!!!!!!!!!! Drink fluids

## 2015-11-14 ENCOUNTER — Encounter: Payer: Self-pay | Admitting: Family

## 2015-11-14 NOTE — Progress Notes (Signed)
Patient ID: Jessica [name] KitchenCiara J [name], female   DOB: 1996/03/16, 20 y.o.   MRN: [id number] Pre-Visit Planning  Jessica [name] KitchenCiara J [name]  is a 20 y.o. female referred by Jessica [name],Jessica V, MD.   Last seen in [facility] on 10/18/15 for AN, POTS, anxiety.  Date and Type of Previous Psych Screenings? Yes EAT-26 10/18/2015  Total Score 18  Patient Report of Weight-Highest   Patient Report of Weight-Lowest   Patient Report of Weight-Ideal   Gone on eating binges where you feel that you may not be able to stop? Never  Ever made yourself sick (vomited) to control your weight or shape? Never  Ever used laxatives, diet pills or diuretics (water pills) to control your weight or shape? Never  Exercised more than 60 minutes a day to lose or to control your weight? Never  Lost 20 pounds or more in the past 6 months? No  PHQ-SADS 10/18/2015  PHQ-15 12  GAD-7 4  PHQ-9 5  Suicidal Ideation No  Comment Somewhat difficult          PHQ-SADS 10/18/2015 05/24/2015 04/29/2015  PHQ-15 12 11 12   GAD-7 4 4 7   PHQ-9 5 5 9   Suicidal Ideation No No No  Comment Somewhat difficult  Somewhat difficult Somewhat difficult   PHQ-SADS 04/13/2015  PHQ-15 14  GAD-7 9  PHQ-9 8  Suicidal Ideation No  Comment Somewhat difficult     Clinical Staff Visit Tasks:   - Urine GC/CT due? no - HIV Screening due?  no - Psych Screenings Due? No - DE no EVS -repeat TFTs and cortisol labs today -   Provider Visit Tasks: - discuss exercise/activity, eating patterns, symptoms - continued progress - [facility] No - Pertinent Labs? Yes - need TFTs and cortisol drawn today   >3 minutes spent reviewing records and planning for patient's visit.

## 2015-11-15 ENCOUNTER — Ambulatory Visit: Payer: Self-pay | Admitting: Pediatrics

## 2015-11-16 ENCOUNTER — Ambulatory Visit (INDEPENDENT_AMBULATORY_CARE_PROVIDER_SITE_OTHER): Payer: BLUE CROSS/BLUE SHIELD | Admitting: Family

## 2015-11-16 ENCOUNTER — Encounter: Payer: Self-pay | Admitting: Family

## 2015-11-16 VITALS — BP 113/67 | HR 80 | Ht 65.5 in | Wt 135.2 lb

## 2015-11-16 DIAGNOSIS — Z1389 Encounter for screening for other disorder: Secondary | ICD-10-CM | POA: Diagnosis not present

## 2015-11-16 DIAGNOSIS — F5000 Anorexia nervosa, unspecified: Secondary | ICD-10-CM

## 2015-11-16 DIAGNOSIS — J0100 Acute maxillary sinusitis, unspecified: Secondary | ICD-10-CM | POA: Diagnosis not present

## 2015-11-16 LAB — POCT URINALYSIS DIPSTICK
Glucose, UA: NEGATIVE
KETONES UA: NEGATIVE
NITRITE UA: NEGATIVE
PH UA: 7
PROTEIN UA: NEGATIVE
Spec Grav, UA: 1.01
Urobilinogen, UA: NEGATIVE

## 2015-11-16 MED ORDER — ZITHROMAX Z-PAK 250 MG PO TABS: ORAL_TABLET | ORAL | Status: DC

## 2015-11-16 MED ORDER — FLUTICASONE PROPIONATE 50 MCG/ACT NA SUSP
2.0000 | Freq: Every day | NASAL | Status: DC
Start: 2015-11-16 — End: 2018-06-28

## 2015-11-16 NOTE — Progress Notes (Signed)
THIS RECORD MAY CONTAIN CONFIDENTIAL INFORMATION THAT SHOULD NOT BE RELEASED WITHOUT REVIEW OF THE SERVICE PROVIDER.  Adolescent Medicine Consultation Follow-Up Visit Jessica [name]  is a 20 y.o. female referred by [name], MD here today for follow-up.    Previsit planning completed:  Yes Patient ID: Jessica [name], female   DOB: March 24, 1996, 20 y.o.   MRN: [id number] Pre-Visit Planning  Jessica [name]  is a 20 y.o. female referred by Norman Clay, MD.   Last seen in [facility] on 10/18/15 for AN, POTS, anxiety.  Date and Type of Previous Psych Screenings? Yes EAT-26 10/18/2015  Total Score 18  Patient Report of Weight-Highest   Patient Report of Weight-Lowest   Patient Report of Weight-Ideal   Gone on eating binges where you feel that you may not be able to stop? Never  Ever made yourself sick (vomited) to control your weight or shape? Never  Ever used laxatives, diet pills or diuretics (water pills) to control your weight or shape? Never  Exercised more than 60 minutes a day to lose or to control your weight? Never  Lost 20 pounds or more in the past 6 months? No  PHQ-SADS 10/18/2015  PHQ-15 12  GAD-7 4  PHQ-9 5  Suicidal Ideation No  Comment Somewhat difficult          PHQ-SADS 10/18/2015 05/24/2015 04/29/2015  PHQ-15 12 11 12   GAD-7 4 4 7   PHQ-9 5 5 9   Suicidal Ideation No No No  Comment Somewhat difficult  Somewhat difficult Somewhat difficult   PHQ-SADS 04/13/2015  PHQ-15 14  GAD-7 9  PHQ-9 8  Suicidal Ideation No  Comment Somewhat difficult     Clinical Staff Visit Tasks:   - Urine GC/CT due? no - HIV Screening due?  no - Psych Screenings Due? No - DE no EVS -repeat TFTs and cortisol labs today -   Provider Visit Tasks: - discuss exercise/activity, eating patterns, symptoms - continued progress - [facility] Involvement? No - Pertinent Labs? Yes - need TFTs and cortisol drawn today   >3 minutes spent  reviewing records and planning for patient's visit.   Growth Chart Viewed? yes   History was provided by the patient.  PCP Confirmed?  Yes, [name], md   My Chart Activated?   yes   HPI:   Upstairs air went out, and opened windows - Easter weekend - started as  Sore throat, coughing with no sputum; has progressed to nasal congestion - green colored secretions with pounding headache, forehead and cheek pain. Malaise. Last abx: around Christmas.   She also notes that she is still coughing some, even before current issues.  Using Zantac. Unsure if it is helpful.   Patient's last menstrual period was 10/04/2015. Allergies  Allergen Reactions  . Amoxicillin Rash   Outpatient Prescriptions Prior to Visit  Medication Sig Dispense Refill  . atenolol (TENORMIN) 25 MG tablet Take 0.5 tablets (12.5 mg total) by mouth daily.    . Calcium-Vitamin D-Vitamin K (VIACTIV PO) Take 1 tablet by mouth daily.     Marland Kitchen docusate sodium (COLACE) 100 MG capsule Take 1 capsule (100 mg total) by mouth daily. 30 capsule 6  . fludrocortisone (FLORINEF) 0.1 MG tablet Take 1 tablet by mouth daily 90 tablet 5  . FLUoxetine (PROZAC) 40 MG capsule TAKE 1 CAPSULE (40 MG TOTAL) BY MOUTH DAILY. 30 capsule 2  . Multiple Vitamin (MULTIVITAMIN WITH MINERALS) TABS tablet Take 1 tablet by mouth daily.    Marland Kitchen  norethindrone-ethinyl estradiol-iron (JUNEL FE 1.5/30) 1.5-30 MG-MCG tablet Take 1 tablet by mouth daily. 1 Package 11  . OLANZapine (ZYPREXA) 2.5 MG tablet TAKE 1/2 TABLET (1.25 MG TOTAL) BY MOUTH AT BEDTIME. 30 tablet 0  . polyethylene glycol (MIRALAX / GLYCOLAX) packet Take 17 g by mouth daily.    . ranitidine (ZANTAC) 150 MG tablet TAKE 1 TABLET BY MOUTH 2 TIMES DAILY. 30 tablet 2  . SUMAtriptan (IMITREX) 25 MG tablet Take 25 mg by mouth every 2 (two) hours as needed for migraine. May repeat in 2 hours if headache persists or recurs.    . Topiramate ER 150 MG CS24      No facility-administered medications prior  to visit.     Patient Active Problem List   Diagnosis Date Noted  . Nonspecific abnormal results of endocrine function study 09/21/2015  . Concussion with loss of consciousness 07/23/2015  . Adjustment disorder with mixed anxiety and depressed mood 07/13/2015  . Chronic bilateral lower abdominal pain 05/24/2015  . Anorexia nervosa 04/29/2015  . Postural orthostatic tachycardia syndrome 09/26/2014  . Migraine without aura 10/30/2012  . Episodic tension type headache 10/30/2012  . Other specified cardiac dysrhythmias(427.89) 10/30/2012  . Orthostatic hypotension 10/30/2012    Confidentiality was discussed with the patient and if applicable, with caregiver as well.  Patient's personal or confidential phone number: [phone]  The following portions of the patient's history were reviewed and updated as appropriate: allergies, current medications, past family history, past medical history, past social history, past surgical history and problem list.  Physical Exam:  Filed Vitals:   11/16/15 1543  BP: 113/67  Pulse: 80  Height: 5' 5.5" (1.664 m)  Weight: 135 lb 3.2 oz (61.326 kg)   BP 113/67 mmHg  Pulse 80  Ht 5' 5.5" (1.664 m)  Wt 135 lb 3.2 oz (61.326 kg)  BMI 22.15 kg/m2  LMP 10/04/2015 Body mass index: body mass index is 22.15 kg/(m^2). Facility age limit for growth percentiles is 20 years.  Wt Readings from Last 3 Encounters:  11/16/15 135 lb 3.2 oz (61.326 kg)  10/18/15 133 lb 9.6 oz (60.6 kg)  09/20/15 133 lb 3.2 oz (60.419 kg)    Physical Exam  Constitutional: No distress.  Appears fatigued   HENT:  Head: Normocephalic and atraumatic.  Right Ear: External ear normal.  Left Ear: External ear normal.  Mouth/Throat: Oropharynx is clear and moist.  Oropharynx erythematous  +maxillary and +frontal pain w palpation  Erythamatous nares, purulent drainage  Eyes: Conjunctivae and EOM are normal. Pupils are equal, round, and reactive to light. Right eye exhibits no  discharge. Left eye exhibits no discharge. No scleral icterus.  Neck: Normal range of motion. Neck supple. No thyromegaly present.  Cardiovascular: Normal rate and regular rhythm.   No murmur heard. Pulmonary/Chest: Effort normal and breath sounds normal. She has no wheezes.  Abdominal: Soft.  Lymphadenopathy:    She has no cervical adenopathy.  Skin: Skin is warm and dry. No rash noted.  Psychiatric: She has a normal mood and affect.     Assessment/Plan: 1. Anorexia nervosa -stable, doing well.  -continue with treatment plan with no changes     2. Acute maxillary sinusitis, recurrence not specified -return precautions given; follow up with PCP if not improved or if worsening or new symptoms.  -discussed that mom uses protonix for GERD and may consider changing from Zantac if cough persists after current illness.   - ZITHROMAX Z-PAK 250 MG tablet; Take as directed on  package.  Dispense: 6 each; Refill: 0 - fluticasone (FLONASE) 50 MCG/ACT nasal spray; Place 2 sprays into both nostrils daily.  Dispense: 16 g; Refill: 12  3. Screening for genitourinary condition As per protocol  - POCT urinalysis dipstick   Follow-up:  Return for DE management, with any [facility] provider.   Medical decision-making:  > 25 minutes spent, more than 50% of appointment was spent discussing diagnosis and management of symptoms

## 2015-11-17 ENCOUNTER — Ambulatory Visit: Payer: BLUE CROSS/BLUE SHIELD | Admitting: Pediatrics

## 2015-11-17 DIAGNOSIS — F5002 Anorexia nervosa, binge eating/purging type: Secondary | ICD-10-CM | POA: Diagnosis not present

## 2015-11-23 ENCOUNTER — Other Ambulatory Visit: Payer: Self-pay | Admitting: Pediatrics

## 2015-11-24 DIAGNOSIS — F5002 Anorexia nervosa, binge eating/purging type: Secondary | ICD-10-CM | POA: Diagnosis not present

## 2015-12-01 ENCOUNTER — Encounter: Payer: Self-pay | Admitting: *Deleted

## 2015-12-01 ENCOUNTER — Encounter: Payer: BLUE CROSS/BLUE SHIELD | Attending: Pediatrics | Admitting: *Deleted

## 2015-12-01 ENCOUNTER — Ambulatory Visit: Payer: BLUE CROSS/BLUE SHIELD | Admitting: *Deleted

## 2015-12-01 DIAGNOSIS — Z713 Dietary counseling and surveillance: Secondary | ICD-10-CM | POA: Insufficient documentation

## 2015-12-01 DIAGNOSIS — F5002 Anorexia nervosa, binge eating/purging type: Secondary | ICD-10-CM | POA: Diagnosis not present

## 2015-12-01 DIAGNOSIS — F509 Eating disorder, unspecified: Secondary | ICD-10-CM | POA: Diagnosis not present

## 2015-12-01 NOTE — Progress Notes (Signed)
Appointment start time: 1200  Appointment end time: 1230  Patient was seen on 12/01/15 for nutrition counseling pertaining to disordered eating.  She is accompanied by her mother  Primary care provider: Dr. Rana SnareLowe Therapist: Noni SaupeHeather Kitchen  Any other medical team members: Dr. Karlene EinsteinPerry/Adolescent medicine NPs Parents: Bonnita LevanJoy Thomas  Assessment:  [name] states in 8 days.  Is not going to beach after all.  Has no plans for anything fun in the interim States she's been eating.  States she's been trying new foods like asparagus and she liked it. Also tried new chicken and liked it.  Had pasta 3 times in 1 week!!!!!!! She was a little anxious about it, but ate it anyway.   She has been keeping her own food log!  Jessica CousinsCiara continues to make progress towards recovery.    Growth Metrics: Ideal BMI for age: 3621.6 BMI current: : 22.17 % Ideal:  100% Previous growth data: weight/age  38-90th%; height/age at 75%; BMI/age 42-85% Goal BMI range based on growth chart data: 22-25 Goal weight range based on growth chart data: 130-145 lb No further weight restoration needed.  However, she is still restricting/appetite suppressed with Topomax.  When she eats intuitively, she will most likely gain a little more weight   Mental health diagnosis: AN, B/P subtype   Dietary assessment: mom says she's always been picky; currently following gluten-free diet    24 hour recall B: cocoa pebbles  L: 1/4 chicken breast, cheesy asparagus, sweet pot casserole.  Propel S: yogurt ocvered raisins, coffee D: french toast with water S: luna and water  B: special k L: brat, clementine, yogurt covered raisins S: coffee D: grilled chicken, corn, asparagus S: ice cream  B: cocoa pebbles L: 5 breaded chicken nuggets, clementine S: smoothie D: ham, 1/2 deviled  Eggs, quinoa with beans, strawberies with cool whip S: greek yogurt with granola  B: banana and luna bar L: salad with nuts, cheese, craisins, dressing S:  trail mix D: japanese chicken, shrimp, rice, veggies S: chocolate eclair cake   Estimated energy intake: 1300-1600 kcal  Estimated energy needs: 2200 kcal for weight maintenance 275 g CHO 110 g pro 73 g fat  Nutrition Diagnosis: NI-1.4 Inadequate energy intake As related to restricting and purging.  As evidenced by weight loss of 16 pounds .  Intervention/Goals: Nutrition counseling provided. Praised progress.  Challenged cognitive distortions and reiterated positive messages. She still struggles with body checking, but it is much less.  Gave resources on body positivity and created list of positive aspects of recovery. Suggested fun activity to reward for all her progress.  She might go to [employer]. Advised continuing to challenge fear foods    Monitoring and Evaluation: Patient will follow up in 4 weeks.

## 2015-12-01 NOTE — Patient Instructions (Signed)
[email or web address]    Recovering Me: I am happier Stomach issues are improving Blood pressure is improving I have more energy to do things More social  Improved food variety Healthy hair More fiesty- standing up for myself

## 2015-12-08 DIAGNOSIS — F5002 Anorexia nervosa, binge eating/purging type: Secondary | ICD-10-CM | POA: Diagnosis not present

## 2015-12-10 ENCOUNTER — Encounter (HOSPITAL_COMMUNITY): Payer: Self-pay

## 2015-12-10 ENCOUNTER — Emergency Department (HOSPITAL_COMMUNITY): Payer: BLUE CROSS/BLUE SHIELD

## 2015-12-10 ENCOUNTER — Emergency Department (HOSPITAL_COMMUNITY)
Admission: EM | Admit: 2015-12-10 | Discharge: 2015-12-10 | Disposition: A | Discharge status: Home / Self Care | Payer: BLUE CROSS/BLUE SHIELD | Attending: Emergency Medicine | Admitting: Emergency Medicine

## 2015-12-10 DIAGNOSIS — Z880 Allergy status to penicillin: Secondary | ICD-10-CM | POA: Insufficient documentation

## 2015-12-10 DIAGNOSIS — Z7951 Long term (current) use of inhaled steroids: Secondary | ICD-10-CM | POA: Insufficient documentation

## 2015-12-10 DIAGNOSIS — M542 Cervicalgia: Secondary | ICD-10-CM | POA: Diagnosis not present

## 2015-12-10 DIAGNOSIS — R220 Localized swelling, mass and lump, head: Secondary | ICD-10-CM | POA: Diagnosis not present

## 2015-12-10 DIAGNOSIS — S0990XA Unspecified injury of head, initial encounter: Secondary | ICD-10-CM | POA: Insufficient documentation

## 2015-12-10 DIAGNOSIS — G43909 Migraine, unspecified, not intractable, without status migrainosus: Secondary | ICD-10-CM | POA: Insufficient documentation

## 2015-12-10 DIAGNOSIS — Z79899 Other long term (current) drug therapy: Secondary | ICD-10-CM | POA: Insufficient documentation

## 2015-12-10 DIAGNOSIS — Y92410 Unspecified street and highway as the place of occurrence of the external cause: Secondary | ICD-10-CM | POA: Insufficient documentation

## 2015-12-10 DIAGNOSIS — Z8679 Personal history of other diseases of the circulatory system: Secondary | ICD-10-CM | POA: Diagnosis not present

## 2015-12-10 DIAGNOSIS — S0993XA Unspecified injury of face, initial encounter: Secondary | ICD-10-CM | POA: Diagnosis not present

## 2015-12-10 DIAGNOSIS — Y9389 Activity, other specified: Secondary | ICD-10-CM | POA: Diagnosis not present

## 2015-12-10 DIAGNOSIS — Z3202 Encounter for pregnancy test, result negative: Secondary | ICD-10-CM | POA: Diagnosis not present

## 2015-12-10 DIAGNOSIS — R519 Headache, unspecified: Secondary | ICD-10-CM

## 2015-12-10 DIAGNOSIS — Y998 Other external cause status: Secondary | ICD-10-CM | POA: Diagnosis not present

## 2015-12-10 DIAGNOSIS — T148 Other injury of unspecified body region: Secondary | ICD-10-CM | POA: Diagnosis not present

## 2015-12-10 DIAGNOSIS — S0181XA Laceration without foreign body of other part of head, initial encounter: Secondary | ICD-10-CM | POA: Diagnosis not present

## 2015-12-10 DIAGNOSIS — R51 Headache: Secondary | ICD-10-CM | POA: Diagnosis not present

## 2015-12-10 DIAGNOSIS — S199XXA Unspecified injury of neck, initial encounter: Secondary | ICD-10-CM | POA: Diagnosis not present

## 2015-12-10 LAB — POC URINE PREG, ED: Preg Test, Ur: NEGATIVE

## 2015-12-10 MED ORDER — ONDANSETRON 4 MG PO TBDP
4.0000 mg | ORAL_TABLET | Freq: Once | ORAL | Status: AC
  Administered 2015-12-10: 4 mg via ORAL
  Filled 2015-12-10: qty 1

## 2015-12-10 MED ORDER — METHOCARBAMOL 500 MG PO TABS: 500.0000 mg | ORAL_TABLET | Freq: Four times a day (QID) | ORAL | Status: DC | PRN

## 2015-12-10 MED ORDER — IBUPROFEN 800 MG PO TABS: 800.0000 mg | ORAL_TABLET | Freq: Three times a day (TID) | ORAL | Status: DC | PRN
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
## 2015-12-14 ENCOUNTER — Encounter: Payer: Self-pay | Admitting: Family

## 2015-12-14 ENCOUNTER — Ambulatory Visit (INDEPENDENT_AMBULATORY_CARE_PROVIDER_SITE_OTHER): Payer: BLUE CROSS/BLUE SHIELD | Admitting: Family

## 2015-12-14 VITALS — BP 95/60 | HR 65 | Ht 65.5 in | Wt 135.6 lb

## 2015-12-14 DIAGNOSIS — F5000 Anorexia nervosa, unspecified: Secondary | ICD-10-CM | POA: Diagnosis not present

## 2015-12-14 DIAGNOSIS — Z1389 Encounter for screening for other disorder: Secondary | ICD-10-CM

## 2015-12-14 DIAGNOSIS — F4323 Adjustment disorder with mixed anxiety and depressed mood: Secondary | ICD-10-CM | POA: Diagnosis not present

## 2015-12-14 LAB — POCT URINALYSIS DIPSTICK
BILIRUBIN UA: NEGATIVE
Glucose, UA: NEGATIVE
Ketones, UA: NEGATIVE
NITRITE UA: NEGATIVE
PH UA: 6.5
SPEC GRAV UA: 1.015
UROBILINOGEN UA: NEGATIVE
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
## 2015-12-15 DIAGNOSIS — F5002 Anorexia nervosa, binge eating/purging type: Secondary | ICD-10-CM | POA: Diagnosis not present

## 2015-12-22 ENCOUNTER — Other Ambulatory Visit: Payer: Self-pay | Admitting: Family

## 2015-12-22 DIAGNOSIS — F5002 Anorexia nervosa, binge eating/purging type: Secondary | ICD-10-CM | POA: Diagnosis not present

## 2015-12-23 ENCOUNTER — Other Ambulatory Visit: Payer: Self-pay | Admitting: Pediatrics

## 2015-12-28 ENCOUNTER — Encounter: Payer: BLUE CROSS/BLUE SHIELD | Attending: Pediatrics | Admitting: *Deleted

## 2015-12-28 ENCOUNTER — Encounter: Payer: Self-pay | Admitting: *Deleted

## 2015-12-28 ENCOUNTER — Other Ambulatory Visit: Payer: Self-pay | Admitting: Pediatrics

## 2015-12-28 DIAGNOSIS — Z713 Dietary counseling and surveillance: Secondary | ICD-10-CM | POA: Insufficient documentation

## 2015-12-28 DIAGNOSIS — F509 Eating disorder, unspecified: Secondary | ICD-10-CM | POA: Diagnosis not present
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
## 2015-12-29 DIAGNOSIS — F5002 Anorexia nervosa, binge eating/purging type: Secondary | ICD-10-CM | POA: Diagnosis not present

## 2015-12-30 ENCOUNTER — Ambulatory Visit: Payer: BLUE CROSS/BLUE SHIELD | Admitting: *Deleted

## 2016-01-04 ENCOUNTER — Ambulatory Visit (INDEPENDENT_AMBULATORY_CARE_PROVIDER_SITE_OTHER): Payer: BLUE CROSS/BLUE SHIELD | Admitting: Family

## 2016-01-04 ENCOUNTER — Encounter: Payer: Self-pay | Admitting: Family

## 2016-01-04 VITALS — BP 97/63 | HR 70 | Ht 64.96 in | Wt 134.5 lb

## 2016-01-04 DIAGNOSIS — R000 Tachycardia, unspecified: Secondary | ICD-10-CM

## 2016-01-04 DIAGNOSIS — F5000 Anorexia nervosa, unspecified: Secondary | ICD-10-CM

## 2016-01-04 DIAGNOSIS — Z1389 Encounter for screening for other disorder: Secondary | ICD-10-CM

## 2016-01-04 DIAGNOSIS — F4323 Adjustment disorder with mixed anxiety and depressed mood: Secondary | ICD-10-CM | POA: Diagnosis not present

## 2016-01-04 DIAGNOSIS — G90A Postural orthostatic tachycardia syndrome (POTS): Secondary | ICD-10-CM

## 2016-01-04 DIAGNOSIS — I951 Orthostatic hypotension: Secondary | ICD-10-CM

## 2016-01-04 LAB — POCT URINALYSIS DIPSTICK
BILIRUBIN UA: NEGATIVE
Blood, UA: NEGATIVE
GLUCOSE UA: NEGATIVE
KETONES UA: NEGATIVE
NITRITE UA: NEGATIVE
PH UA: 7.5
Spec Grav, UA: 1.01
Urobilinogen, UA: NEGATIVE

## 2016-01-04 NOTE — Patient Instructions (Signed)
Start eating both items of your snack together and drink Propel instead of plain water.
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
## 2016-01-06 ENCOUNTER — Ambulatory Visit (INDEPENDENT_AMBULATORY_CARE_PROVIDER_SITE_OTHER): Payer: BLUE CROSS/BLUE SHIELD | Admitting: Family

## 2016-01-06 ENCOUNTER — Encounter: Payer: Self-pay | Admitting: Family

## 2016-01-06 VITALS — BP 90/66 | HR 86 | Ht 65.5 in | Wt 135.0 lb

## 2016-01-06 DIAGNOSIS — G43009 Migraine without aura, not intractable, without status migrainosus: Secondary | ICD-10-CM | POA: Diagnosis not present

## 2016-01-06 DIAGNOSIS — S060X1S Concussion with loss of consciousness of 30 minutes or less, sequela: Secondary | ICD-10-CM

## 2016-01-06 DIAGNOSIS — R000 Tachycardia, unspecified: Secondary | ICD-10-CM | POA: Diagnosis not present

## 2016-01-06 DIAGNOSIS — G90A Postural orthostatic tachycardia syndrome (POTS): Secondary | ICD-10-CM

## 2016-01-06 DIAGNOSIS — G44219 Episodic tension-type headache, not intractable: Secondary | ICD-10-CM | POA: Diagnosis not present

## 2016-01-06 DIAGNOSIS — S139XXA Sprain of joints and ligaments of unspecified parts of neck, initial encounter: Secondary | ICD-10-CM

## 2016-01-06 DIAGNOSIS — I951 Orthostatic hypotension: Secondary | ICD-10-CM
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
## 2016-01-06 NOTE — Patient Instructions (Signed)
Continue your medications as you have been taking them. Let me know if your headaches become more frequent or more severe.   Remember that you need to be very well hydrated and have a salty snack, especially on days that you are very active or exposed to hot temperatures.   For your neck, try massage and gentle range of motion exercises. It will take some time for the soreness to improve.   Consider signing up for MyChart - your online access to your electronic medical records.   Please plan to return for follow up in 6 months or sooner if needed.

## 2016-01-07 DIAGNOSIS — S161XXA Strain of muscle, fascia and tendon at neck level, initial encounter: Secondary | ICD-10-CM

## 2016-01-07 DIAGNOSIS — S139XXA Sprain of joints and ligaments of unspecified parts of neck, initial encounter: Secondary | ICD-10-CM | POA: Insufficient documentation

## 2016-01-07 DIAGNOSIS — IMO0002 Reserved for concepts with insufficient information to code with codable children: Secondary | ICD-10-CM | POA: Insufficient documentation

## 2016-01-07 MED ORDER — TOPIRAMATE ER 150 MG PO SPRINKLE CAP24: EXTENDED_RELEASE_CAPSULE | ORAL | Status: DC

## 2016-01-11 DIAGNOSIS — F5002 Anorexia nervosa, binge eating/purging type: Secondary | ICD-10-CM | POA: Diagnosis not present

## 2016-01-17 ENCOUNTER — Encounter: Payer: Self-pay | Admitting: Family

## 2016-01-17 ENCOUNTER — Encounter: Payer: Self-pay | Admitting: *Deleted

## 2016-01-17 ENCOUNTER — Encounter: Payer: BLUE CROSS/BLUE SHIELD | Admitting: *Deleted

## 2016-01-17 ENCOUNTER — Ambulatory Visit (INDEPENDENT_AMBULATORY_CARE_PROVIDER_SITE_OTHER): Payer: BLUE CROSS/BLUE SHIELD | Admitting: Family

## 2016-01-17 VITALS — BP 98/65 | HR 77 | Ht 65.35 in | Wt 132.4 lb

## 2016-01-17 DIAGNOSIS — F5000 Anorexia nervosa, unspecified: Secondary | ICD-10-CM | POA: Diagnosis not present

## 2016-01-17 DIAGNOSIS — Z1389 Encounter for screening for other disorder: Secondary | ICD-10-CM

## 2016-01-17 DIAGNOSIS — Z713 Dietary counseling and surveillance: Secondary | ICD-10-CM | POA: Diagnosis not present

## 2016-01-17 DIAGNOSIS — F509 Eating disorder, unspecified: Secondary | ICD-10-CM | POA: Diagnosis not present

## 2016-01-17 LAB — POCT URINALYSIS DIPSTICK
BILIRUBIN UA: NEGATIVE
Blood, UA: NEGATIVE
GLUCOSE UA: NEGATIVE
Ketones, UA: NEGATIVE
LEUKOCYTES UA: NEGATIVE
NITRITE UA: NEGATIVE
Protein, UA: NEGATIVE
Spec Grav, UA: <=1.005
Urobilinogen, UA: NEGATIVE
pH, UA: 8

## 2016-01-17 NOTE — Patient Instructions (Signed)
Keep an eye on your nausea and stress levels this week - see if there is anything seems to make them better or worse Labs today See you in two weeks!

## 2016-01-17 NOTE — Progress Notes (Signed)
THIS RECORD MAY CONTAIN CONFIDENTIAL INFORMATION THAT SHOULD NOT BE RELEASED WITHOUT REVIEW OF THE SERVICE PROVIDER.  Adolescent Medicine Consultation Follow-Up Visit Jessica KitchenCiara J [name]  is a 20 y.o. female referred by [name], [name], MD here today for follow-up.    Previsit planning completed:  no  Growth Chart Viewed? yes   History was provided by the patient and mother.  PCP Confirmed?  Yes, Loyola MastMelissa Lowe, MD   My Chart Activated?   yes   HPI:   6/23 - Passed out while on the toilet and had "seizure." Bent over and then got up and realized she had passed out. Had hit head on counter. Then started having shaking episode. Was alert and trying to call out, but not able to. "Dazed" afterward. 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 Eating a little differently having been at camp, with less choices than usual. Ate fatty meal on the way home, which she thinks upset her stomach.  Otherwise, doing okay. Nausea is a little better this week. She is not clear on what exactly makes this better or worse.   Review of Systems  Constitutional: Negative.   HENT: Negative.   Eyes: Negative.   Respiratory: Negative.   Cardiovascular: Negative.   Gastrointestinal: Positive for heartburn (controlled with ranitidine ) and nausea. Negative for vomiting, diarrhea and blood in stool.  Genitourinary: Negative.   Musculoskeletal: Negative.   Skin: Negative.   Neurological: Negative.   Endo/Heme/Allergies: Negative.   Psychiatric/Behavioral: Negative.     Patient's last menstrual period was 12/08/2015 (exact date). Allergies  Allergen Reactions  . Amoxicillin Rash   Past Medical History  Diagnosis Date  . POTS (postural orthostatic tachycardia syndrome)   . Migraine headache   . Syncope 09/22/2014       Patient Active Problem List   Diagnosis Date Noted  . Neck sprain and strain 01/07/2016  . Nonspecific abnormal results of endocrine function study 09/21/2015  . Concussion with loss of  consciousness 07/23/2015  . Adjustment disorder with mixed anxiety and depressed mood 07/13/2015  . Chronic bilateral lower abdominal pain 05/24/2015  . Anorexia nervosa 04/29/2015  . Postural orthostatic tachycardia syndrome 09/26/2014  . Migraine without aura 10/30/2012  . Episodic tension type headache 10/30/2012  . Other specified cardiac dysrhythmias(427.89) 10/30/2012  . Orthostatic hypotension 10/30/2012    The following portions of the patient's history were reviewed and updated as appropriate: allergies, current medications, past family history, past medical history, past social history, past surgical history and problem list.  Physical Exam:  Filed Vitals:   01/17/16 1134  BP: 98/65  Pulse: 77  Height: 5' 5.35" (1.66 m)  Weight: 132 lb 6.4 oz (60.056 kg)   BP 98/65 mmHg  Pulse 77  Ht 5' 5.35" (1.66 m)  Wt 132 lb 6.4 oz (60.056 kg)  BMI 21.79 kg/m2  LMP 12/08/2015 (Exact Date) Body mass index: body mass index is 21.79 kg/(m^2). Facility age limit for growth percentiles is 20 years.  Wt Readings from Last 3 Encounters:  01/17/16 132 lb 6.4 oz (60.056 kg)  01/06/16 135 lb (61.236 kg)  01/04/16 134 lb 7.7 oz (61 kg)    BP Readings from Last 3 Encounters:  01/17/16 98/65  01/06/16 90/66  01/04/16 97/63    Physical Exam  Constitutional: She is oriented to person, place, and time. No distress.  HENT:  Head: Normocephalic and atraumatic.  Mouth/Throat: Oropharynx is clear and moist. No oropharyngeal exudate.  Eyes: EOM are normal.  Neck:  Normal range of motion.  Cardiovascular: Normal rate, regular rhythm and normal heart sounds.   No murmur heard. Pulmonary/Chest: Effort normal and breath sounds normal. No respiratory distress.  Neurological: She is alert and oriented to person, place, and time. No cranial nerve deficit.  Skin: Skin is warm and dry. No rash noted.  Psychiatric: She has a normal mood and affect.  Vitals reviewed.    Assessment/Plan: 1.  Anorexia nervosa - with recently increased anxiety and weight loss surrounding stress from camp and finishing summer session -Seen by nutrition today with plan for 64 oz fluids, 24 oz of Propel. Increase caloric intake with additional [place] -Weight down today; question whether this is related to situational restrictive eating or to simply having the opportunity to restric.  - Will need to monitor closely over the next weeks as she returns home, back to normal environment - Weight check in two weeks  2. Postural orthostatic tachycardia syndrome -propel as above; continue to monitor  -BP has been lower the last two OVs; reviewed with patient and mother   3. Adjustment disorder with mixed anxiety and depressed mood: PHQSADS today (PHQ 15, GAD7 7 and PHQ9 7), slightly worsened anxiety and depression from prior checks, again likely correlating with her recent environmental stressors and her nausea  4. Screening for genitourinary condition - POCT urinalysis dipstick  Follow-up:  No Follow-up on file.   Medical decision-making:  > 25 minutes spent, more than 50% of appointment was spent discussing diagnosis and management of symptoms

## 2016-01-17 NOTE — Progress Notes (Signed)
Appointment start time: 1030  Appointment end time: 1115  Patient was seen on 01/17/16 for nutrition counseling pertaining to disordered eating.  She is accompanied by her mother  Primary care provider: Dr. Rana SnareLowe Therapist: Noni SaupeHeather Kitchen  Any other medical team members: Dr. Karlene EinsteinPerry/Adolescent medicine NPs Parents: Bonnita LevanJoy Thomas  Assessment:  Stays very busy with school just finishing. [place] and this [place] will be VBS at her church.  While at camp last week she was VERY physically active.  She did not eat more to compensate and in fact, ate less because they didn't have many food options that were gluten-free.  On Friday as they were coming back home from camp, they stopped at [employer] and her stomach immediately started hurting.  Later that night after dinner she had really bad upset stomach and then had seizure on the toilet and hit her head.  Vital signs have been on downward trend due to restrictive eating behaviors.     At last nutrition visit she was instructed to add CIB to breakfast daily.  She did that for a few days, but not in over a week.  She does not get adequate fluids   Growth Metrics: Ideal BMI for age: 4221.6 BMI current: 22.17 % Ideal:  100% Previous growth data: weight/age  7-90th%; height/age at 75%; BMI/age 39-85% Goal BMI range based on growth chart data: 22-25 Goal weight range based on growth chart data: 130-145 lb No further weight restoration needed.  However, she is still restricting/appetite suppressed with Topomax.  When she eats intuitively, she will most likely gain a little more weight   Mental health diagnosis: AN, B/P subtype   Dietary assessment: mom says she's always been picky; currently following gluten-free diet B: luna bar, greek yogurt smoothie L: 5 gluten free chicken nuggets with vegetable S: trail mix D: 3 slices french toast S: yogurt covered raisins Beverages: propel, cup water, coffee  B: cheerios and regular  milk S: [place] protein bar propel L: charbroiled chicken breast with cheese. sprite  Upset stomach D: cheese enchilada and rice.  water  Really upset stomach  B: cocoa pebbles L: PB sandwich with cheddar popcorn S: luna bar D: black bean burger with salad S: ice cream sundae Beverages: 2. Water   Estimated energy intake: 1400-1600 kcal  Estimated energy needs: 2200 kcal for weight maintenance 275 g CHO 110 g pro 73 g fat  Nutrition Diagnosis: NI-1.4 Inadequate energy intake As related to restricting and purging.  As evidenced by dietary recall  Intervention/Goals: Nutrition counseling provided. Advised notifying her neurologist about her seizure and head collision.  This provider suspects her increased exercise and poor nutrition status the week prior contributed to her symptoms.  Reminded her of her decline in BP as related to POTS symptoms of inadequate fluid and energy.  Dietary recommendations for POTS are small, frequent meals to decrease GI distress and to allow for adequate energy consumption.  She is not getting enough.  Reminded her to have CIB every day added to breakfast.  Need to increase fluids to 64 fl oz/day and 24 of those need to be Propel.  Also aim for salty snacks   Monitoring and Evaluation: Patient will follow up in 2 weeks.

## 2016-01-17 NOTE — Patient Instructions (Signed)
64 fl oz/day!  24 of those need to be Propels.  The rest can be water, juice, soda, etc Please ADD Carnation to breakfast daily Continue current meal plan This is to keep you adequately hydrated and nurished to prevent POTS symptoms Try to incorporate salty snacks We need to get you back to where you were.   Ed is messing with your POTS.  Uncool.  Don't listen Please call neurologist about seizure and hitting head

## 2016-01-19 DIAGNOSIS — F5002 Anorexia nervosa, binge eating/purging type: Secondary | ICD-10-CM | POA: Diagnosis not present

## 2016-01-20 ENCOUNTER — Other Ambulatory Visit: Payer: Self-pay | Admitting: Family

## 2016-01-26 DIAGNOSIS — F5002 Anorexia nervosa, binge eating/purging type: Secondary | ICD-10-CM | POA: Diagnosis not present

## 2016-02-01 ENCOUNTER — Encounter: Payer: Self-pay | Admitting: Family

## 2016-02-01 ENCOUNTER — Ambulatory Visit (INDEPENDENT_AMBULATORY_CARE_PROVIDER_SITE_OTHER): Payer: BLUE CROSS/BLUE SHIELD | Admitting: Family

## 2016-02-01 ENCOUNTER — Encounter: Payer: BLUE CROSS/BLUE SHIELD | Attending: Pediatrics | Admitting: *Deleted

## 2016-02-01 ENCOUNTER — Telehealth: Payer: Self-pay

## 2016-02-01 VITALS — BP 98/66 | HR 85 | Ht 65.75 in | Wt 133.6 lb

## 2016-02-01 DIAGNOSIS — F509 Eating disorder, unspecified: Secondary | ICD-10-CM | POA: Diagnosis not present

## 2016-02-01 DIAGNOSIS — Z713 Dietary counseling and surveillance: Secondary | ICD-10-CM | POA: Diagnosis not present

## 2016-02-01 DIAGNOSIS — Z1389 Encounter for screening for other disorder: Secondary | ICD-10-CM

## 2016-02-01 DIAGNOSIS — N390 Urinary tract infection, site not specified: Secondary | ICD-10-CM | POA: Diagnosis not present

## 2016-02-01 DIAGNOSIS — R000 Tachycardia, unspecified: Secondary | ICD-10-CM

## 2016-02-01 DIAGNOSIS — F4323 Adjustment disorder with mixed anxiety and depressed mood: Secondary | ICD-10-CM | POA: Diagnosis not present

## 2016-02-01 DIAGNOSIS — F5000 Anorexia nervosa, unspecified: Secondary | ICD-10-CM

## 2016-02-01 DIAGNOSIS — G90A Postural orthostatic tachycardia syndrome (POTS): Secondary | ICD-10-CM

## 2016-02-01 DIAGNOSIS — R82998 Other abnormal findings in urine: Secondary | ICD-10-CM

## 2016-02-01 DIAGNOSIS — I951 Orthostatic hypotension: Secondary | ICD-10-CM

## 2016-02-01 LAB — POCT URINALYSIS DIPSTICK
BILIRUBIN UA: NEGATIVE
Glucose, UA: NEGATIVE
KETONES UA: NEGATIVE
Nitrite, UA: NEGATIVE
PH UA: 7
SPEC GRAV UA: 1.01
Urobilinogen, UA: NEGATIVE

## 2016-02-01 NOTE — Patient Instructions (Signed)
Delbert HarnessJulie Kordsmeier, FNP [facility] [place]  [phone]

## 2016-02-01 NOTE — Telephone Encounter (Signed)
I called and talked with Jessica [name]. 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 The group stopped to eat at [employer] on the way home and Jessica [name] said that the food upset her stomach. That evening, she was using the toilet and experiencing severe abdominal cramping. She realized that she felt faint and attempted to call for help. She said that she was leaning over (while sitting) and realized that her head felt like it was shaking. Then she said that she blacked out, and when she awakened, realized that she had hit her head on bathroom fixture. Her brother heard her in distress and called parents to assist her. Mom told her that she estimated that she was unresponsive for about 1 minute once she arrived. When she was awaken, they helped her from the bathroom to her bed. She said that she had a headache and continued to have abdominal cramping. She was otherwise awake and alert. She was able to go to sleep and when she awakened the next day, said that she felt normal. She has had no further episodes since then. I told Jessica [name] that the event sounded more like a syncopal event, likely vasovagal as she was experiencing severe abdominal cramping. I reminded her of the need to be well hydrated and asked her to let me know if she has any further events with loss of awareness. Jessica [name] agreed with this plan. TG

## 2016-02-01 NOTE — Progress Notes (Signed)
Appointment start time: 0900  Appointment end time: 0945  Patient was seen on 02/01/16 for nutrition counseling pertaining to disordered eating.  She is accompanied by her mother  Primary care provider: Dr. Rana SnareLowe Therapist: Noni SaupeHeather Kitchen  Any other medical team members: Dr. Karlene EinsteinPerry/Adolescent medicine NPs Parents: Bonnita LevanJoy Thomas  Assessment:  Is going to the beach next week and she's excited.   She has not called her neurologist after her latest seizure and head injury.  She says she's been busy and forgot.  Her sister has been struggling with her own medical issues and [name] has been heavily involved in volunteering this summer.  Things should be settling down soon.   She has not been drinking adequate fluids, nor eating all her snacks, not she did consistently add the CIB as requested.  She realizes she hasn't been getting enough. HR is somewhat improved today, but BP still not back to healthiest rate.  Still complains of dizziness/lighheadedness.    Something seems to have shifted in her self-care approach or maybe mom is not paying as close attention anymore now that [name] is presumably old enough.     Growth Metrics: Ideal BMI for age: 5921.6 134 on NDES scale, 133 on CFC scale BMI current: 21.73 % Ideal:  100% Previous growth data: weight/age  75-90th%; height/age at 75%; BMI/age 22-85% Goal BMI range based on growth chart data: 22-25 Goal weight range based on growth chart data: 130-145 lb No further weight restoration needed.  However, she is still restricting/appetite suppressed with Topomax.  When she eats intuitively, she will most likely gain a little more weight   Mental health diagnosis: AN, B/P subtype   Dietary assessment: mom says she's always been picky; currently following gluten-free diet B: cocoa pebbles with soy milk L: 3 slices pizza S: CIB D: cracker barrle BBQ with broccoli and apples S: apple crisp with ice cream Beverages: water (32 oz)  B: CIB and Luna bar L:  chicken enchilada and rice S: trial mix and plum D: cheesy asparagus and plum S: apple crisp Beverages: inadequate (32 oz)    Estimated energy intake: 1400-1600 kcal  Estimated energy needs: 2200 kcal for weight maintenance 275 g CHO 110 g pro 73 g fat  Nutrition Diagnosis: NI-1.4 Inadequate energy intake As related to restricting and purging.  As evidenced by dietary recall  Intervention/Goals: Nutrition counseling provided.  Discussed barriers to self-care.  Darnella seems to have poor insight into why she isn't getting enough or how things could improve.  She did agree to take a sip of water every time someone texts her.  Discussed how important taking care of herself is because of her POTS.  Her weight is "ok" her vital signs are "ok" but she has POTS so things are higher risk.  She needs to take her fluids and food goals seriously so she can get on with a normal life.  Discussed life without an eating disorder and what that looks like.  She agreed to add the CIB to night time snack since she's sleeping in later Advised calling neurologist immediately.   Monitoring and Evaluation: Patient will follow up in 1 weeks.

## 2016-02-01 NOTE — Patient Instructions (Signed)
Drink water every time you text Add CIB to night time snack Stay consistent with eating all 3 meals and 2 snacks CALL NEUROLOGIST!!!!!!!!!!!!!!!!!!!!!!!!!!!!!!!!!!!!  7/20 at 3 pm

## 2016-02-01 NOTE — Telephone Encounter (Signed)
I reviewed this note and agree that this is not likely to be seizures but a syncopal event.

## 2016-02-01 NOTE — Progress Notes (Signed)
THIS RECORD MAY CONTAIN CONFIDENTIAL INFORMATION THAT SHOULD NOT BE RELEASED WITHOUT REVIEW OF THE SERVICE PROVIDER.  Adolescent Medicine Consultation Follow-Up Visit CRISTOL ENGDAHL  is a 20 y.o. female referred by [name], MD here today for follow-up.    Previsit planning completed:  no  Growth Chart Viewed? no   History was provided by the patient and mother.  PCP Confirmed?  Yes, [name], MD   My Chart Activated?   yes   HPI:    Gets lightheaded almost every time she stands. Called Neuro in between nutrition and this appt.  Sister has been sick for last week and half; in and out of hospital. Also mother's brother recently ill.   Jinger Neighbors on Thursday. Headaches continue; taking topamax as prescribed. Ran out for a few days and took lower dose; now back to prescribed dose - pharmacy has in stock.  Going to pool, lounging, no t a lot of activity. [place] [facility] due to [place]/sister.   Review of Systems  Constitutional: Negative.  Negative for fever, chills and malaise/fatigue.  HENT: Negative for sore throat and tinnitus.   Eyes: Negative.   Respiratory: Negative.   Cardiovascular: Negative.   Gastrointestinal: Positive for heartburn. Negative for blood in stool.       Not daily reflux,but occasional regurg   Genitourinary: Positive for urgency (noticed last night - urinated 3 times before bed; very little each time). Negative for dysuria and frequency.  Musculoskeletal: Positive for neck pain.  Skin: Negative.   Neurological: Positive for dizziness. Negative for weakness.  Endo/Heme/Allergies: Negative.   Psychiatric/Behavioral: Negative.    Patient's last menstrual period was 12/08/2015 (exact date). Allergies  Allergen Reactions  . Amoxicillin Rash   Outpatient Prescriptions Prior to Visit  Medication Sig Dispense Refill  . atenolol (TENORMIN) 25 MG tablet Take 0.5 tablets (12.5 mg total) by mouth daily.    . Calcium-Vitamin D-Vitamin K (VIACTIV  PO) Take 1 tablet by mouth daily. Reported on 01/04/2016    . fludrocortisone (FLORINEF) 0.1 MG tablet Take 1 tablet by mouth daily 90 tablet 5  . FLUoxetine (PROZAC) 40 MG capsule TAKE 1 CAPSULE (40 MG TOTAL) BY MOUTH DAILY. 30 capsule 2  . FLUoxetine (PROZAC) 40 MG capsule TAKE 1 CAPSULE (40 MG TOTAL) BY MOUTH DAILY. 30 capsule 2  . fluticasone (FLONASE) 50 MCG/ACT nasal spray Place 2 sprays into both nostrils daily. 16 g 12  . ibuprofen (ADVIL,MOTRIN) 800 MG tablet Take 1 tablet (800 mg total) by mouth every 8 (eight) hours as needed for mild pain or moderate pain. 15 tablet 0  . JUNEL 1.5/30 1.5-30 MG-MCG tablet   5  . Multiple Vitamin (MULTIVITAMIN WITH MINERALS) TABS tablet Take 1 tablet by mouth daily.    Marland Kitchen OLANZapine (ZYPREXA) 2.5 MG tablet TAKE 1/2 TABLET (1.25 MG TOTAL) BY MOUTH AT BEDTIME. 30 tablet 0  . polyethylene glycol (MIRALAX / GLYCOLAX) packet Take 17 g by mouth daily.    . ranitidine (ZANTAC) 150 MG tablet TAKE 1 TABLET BY MOUTH 2 TIMES DAILY. 30 tablet 3  . SUMAtriptan (IMITREX) 25 MG tablet Take 25 mg by mouth every 2 (two) hours as needed for migraine. May repeat in 2 hours if headache persists or recurs.    . Topiramate ER 150 MG CS24 TAKE 1 CAPSULE BY MOUTH AT NIGHTTIME 30 each 5   No facility-administered medications prior to visit.     Patient Active Problem List   Diagnosis Date Noted  .  Neck sprain and strain 01/07/2016  . Nonspecific abnormal results of endocrine function study 09/21/2015  . Concussion with loss of consciousness 07/23/2015  . Adjustment disorder with mixed anxiety and depressed mood 07/13/2015  . Chronic bilateral lower abdominal pain 05/24/2015  . Anorexia nervosa 04/29/2015  . Postural orthostatic tachycardia syndrome 09/26/2014  . Migraine without aura 10/30/2012  . Episodic tension type headache 10/30/2012  . Other specified cardiac dysrhythmias(427.89) 10/30/2012  . Orthostatic hypotension 10/30/2012    Social History: Reviewed; out  of school until August start; stressor include recent family sicknesses.   Confidentiality was discussed with the patient and if applicable, with caregiver as well.  Patient's personal or confidential phone number:  Enter confidential phone number in Family Comments section of [employer] Tobacco?  no Drugs/ETOH?  no Partner preference?  female Sexually Active?  no  Pregnancy Prevention:  birth control pills, reviewed condoms & plan B  The following portions of the patient's history were reviewed and updated as appropriate: allergies, current medications, past family history, past medical history, past social history, past surgical history and problem list.  Physical Exam:  Filed Vitals:   02/01/16 1023  BP: 98/66  Pulse: 85  Height: 5' 5.75" (1.67 m)  Weight: 133 lb 9.6 oz (60.601 kg)   BP 98/66 mmHg  Pulse 85  Ht 5' 5.75" (1.67 m)  Wt 133 lb 9.6 oz (60.601 kg)  BMI 21.73 kg/m2  LMP 12/08/2015 (Exact Date) Body mass index: body mass index is 21.73 kg/(m^2). Facility age limit for growth percentiles is 20 years.   Wt Readings from Last 3 Encounters:  02/01/16 133 lb 9.6 oz (60.601 kg)  01/17/16 132 lb 6.4 oz (60.056 kg)  01/06/16 135 lb (61.236 kg)   BP Readings from Last 3 Encounters:  02/01/16 98/66  01/17/16 98/65  01/06/16 90/66    Physical Exam  Constitutional: She is oriented to person, place, and time. No distress.  HENT:  Head: Normocephalic and atraumatic.  Mouth/Throat: Oropharynx is clear and moist. No oropharyngeal exudate.  Eyes: EOM are normal. Pupils are equal, round, and reactive to light.  Neck: Normal range of motion. No thyromegaly present.  Cardiovascular: Normal rate, regular rhythm, normal heart sounds and intact distal pulses.   No murmur heard. Pulmonary/Chest: Effort normal.  Abdominal: Soft.  Musculoskeletal: Normal range of motion.  Lymphadenopathy:    She has no cervical adenopathy.  Neurological: She is alert and oriented to person,  place, and time. She has normal reflexes. No cranial nerve deficit. Coordination normal.  Skin: Skin is warm and dry. No rash noted.  Psychiatric: She has a normal mood and affect. Her behavior is normal.  Vitals reviewed.   Assessment/Plan: 1. Anorexia nervosa -reviewed note from today's visit with RD -reviewed importance of following meal plan as provided by RD; appears disengaged at present with plan; unable to ascertain if r/t recent family stressors or other.  -weight is stable today; slight improvement from last OV  -if difficulty with keeping meal plan, could consider switch from prozac to another medication if appetite suppression of concern.   2. Adjustment disorder with mixed anxiety and depressed mood -as per above, continue prozac 40 mg  -PHQSADS next OV   3. Postural orthostatic tachycardia syndrome -seeing cards this week; needs to see neuro r/t questionable seizure event on 6/23.  4. Urine leukocytes -urgency last night; reviewed results of UA; positive protein, blood, small leuks -will culture and advise if tx indicated; allergies reviewed.  - Urine Culture  5. Screening for genitourinary condition -as per above - POCT urinalysis dipstick   Follow-up:  Return in about 3 weeks (around 02/22/2016).   Medical decision-making:  >25 minutes spent, more than 50% of appointment was spent discussing diagnosis and management of symptoms

## 2016-02-01 NOTE — Telephone Encounter (Signed)
Pt lvm stating that she just met with nutritionist and NP at [facility] . They wanted her to call and let Otila Kluver know that she had a sz while using the bathroom with an upset stomach on 01-14-16 CB# [phone].

## 2016-02-02 DIAGNOSIS — F5002 Anorexia nervosa, binge eating/purging type: Secondary | ICD-10-CM | POA: Diagnosis not present

## 2016-02-03 DIAGNOSIS — R55 Syncope and collapse: Secondary | ICD-10-CM | POA: Diagnosis not present

## 2016-02-03 DIAGNOSIS — G909 Disorder of the autonomic nervous system, unspecified: Secondary | ICD-10-CM | POA: Diagnosis not present

## 2016-02-03 LAB — URINE CULTURE

## 2016-02-05 ENCOUNTER — Other Ambulatory Visit: Payer: Self-pay | Admitting: Pediatrics

## 2016-02-10 ENCOUNTER — Encounter: Payer: Self-pay | Admitting: *Deleted

## 2016-02-10 ENCOUNTER — Encounter: Payer: BLUE CROSS/BLUE SHIELD | Admitting: *Deleted

## 2016-02-10 DIAGNOSIS — F5000 Anorexia nervosa, unspecified: Secondary | ICD-10-CM

## 2016-02-10 DIAGNOSIS — F5002 Anorexia nervosa, binge eating/purging type: Secondary | ICD-10-CM | POA: Diagnosis not present

## 2016-02-10 DIAGNOSIS — Z713 Dietary counseling and surveillance: Secondary | ICD-10-CM | POA: Diagnosis not present

## 2016-02-10 DIAGNOSIS — F419 Anxiety disorder, unspecified: Secondary | ICD-10-CM | POA: Diagnosis not present

## 2016-02-10 DIAGNOSIS — F509 Eating disorder, unspecified: Secondary | ICD-10-CM | POA: Diagnosis not present

## 2016-02-10 NOTE — Progress Notes (Signed)
Appointment start time: 1500  Appointment end time: 1600  Patient was seen on 02/10/16 for nutrition counseling pertaining to disordered eating.  She is accompanied by her mother  Primary care provider: Dr. Rana SnareLowe Therapist: Noni SaupeHeather Kitchen  Any other medical team members: Dr. Karlene EinsteinPerry/Adolescent medicine NPs Parents: Bonnita LevanJoy Thomas  Assessment:  [place].  Is going to the beach again and she's excited Drank water much better throughout the week.  isn't getting enough, but drastically improved Thinks she is eating what she wants without too much DE thoughts.  Is really excited about the ice cream shop at the beach Some walking in the pool 30 minutes 3 times per neurologist recommendations.  Mom curious about food needs when exercising?   Dad has been making disparaging comments about her eating habits.  This has been going on a long time apparently.  Mom has talked to him about stopping and things are somewhat better.  [name] challenged him and a lady at the pool about calories and food this past week!  Mom interested in recommendation for GI, per cards suggestion   Growth Metrics: Ideal BMI for age: 5821.6 134 on NDES scale, 133 on CFC scale BMI current: 21.73 % Ideal:  100% Previous growth data: weight/age  55-90th%; height/age at 75%; BMI/age 63-85% Goal BMI range based on growth chart data: 22-25 Goal weight range based on growth chart data: 130-145 lb No further weight restoration needed.  However, she is still restricting/appetite suppressed with Topomax.  When she eats intuitively, she will most likely gain a little more weight   Mental health diagnosis: AN, B/P subtype   Dietary assessment: mom says she's always been picky; currently following gluten-free diet B: cocoa pepples S: smoothie L: amy's pasta S: yogurt covered raisins and CIB D: tofu burger, cantaloupe, tortilla chips S: cheery dump cake Beverages: 64 oz water  B: luna bar and  CIB L: ACP leftovers S: trail mix D: chiceken salad and fruit S: mini cheddar rice cakes Beverages: 32 oz water and 2 propel  B: cocoa pepples L: 5 nuggets S: Luna and CIB D: grilled chicken, broccoli, salad, squash and zucchini S: watermelon  B: cinnamon chex L: chicken salad and cucumbers D: ACP S: ice cream Beverages: propel, adequate water   Estimated energy intake: 1400-1600 kcal  Estimated energy needs: 2200 kcal for weight maintenance 275 g CHO 110 g pro 73 g fat  Nutrition Diagnosis: NI-1.4 Inadequate energy intake As related to restricting and purging.  As evidenced by dietary recall  Intervention/Goals:  Praised increased fluids and encouraged her to keep that up.  Discussed adding snack on exercise days and while walking on the beach.  discussed challenging "diet police" in her life and HAES principles.   This provider doesn't recommend any particular GI specialist.  Fine to see [facility], same as sister   Monitoring and Evaluation: Patient will follow up in 2 weeks.

## 2016-02-17 ENCOUNTER — Encounter: Payer: Self-pay | Admitting: Pediatrics

## 2016-02-22 ENCOUNTER — Ambulatory Visit (INDEPENDENT_AMBULATORY_CARE_PROVIDER_SITE_OTHER): Payer: BLUE CROSS/BLUE SHIELD | Admitting: Family

## 2016-02-22 ENCOUNTER — Encounter: Payer: Self-pay | Admitting: Family

## 2016-02-22 VITALS — BP 106/64 | HR 85 | Ht 65.75 in | Wt 134.4 lb

## 2016-02-22 DIAGNOSIS — Z1389 Encounter for screening for other disorder: Secondary | ICD-10-CM

## 2016-02-22 DIAGNOSIS — I951 Orthostatic hypotension: Secondary | ICD-10-CM | POA: Diagnosis not present

## 2016-02-22 DIAGNOSIS — K219 Gastro-esophageal reflux disease without esophagitis: Secondary | ICD-10-CM

## 2016-02-22 DIAGNOSIS — R000 Tachycardia, unspecified: Secondary | ICD-10-CM

## 2016-02-22 DIAGNOSIS — F4323 Adjustment disorder with mixed anxiety and depressed mood: Secondary | ICD-10-CM

## 2016-02-22 DIAGNOSIS — F5000 Anorexia nervosa, unspecified: Secondary | ICD-10-CM

## 2016-02-22 DIAGNOSIS — G90A Postural orthostatic tachycardia syndrome (POTS): Secondary | ICD-10-CM

## 2016-02-22 LAB — POCT URINALYSIS DIPSTICK
BILIRUBIN UA: NEGATIVE
GLUCOSE UA: NEGATIVE
Ketones, UA: NEGATIVE
Nitrite, UA: NEGATIVE
Protein, UA: 30
Urobilinogen, UA: NEGATIVE
pH, UA: 8

## 2016-02-22 NOTE — Progress Notes (Signed)
THIS RECORD MAY CONTAIN CONFIDENTIAL INFORMATION THAT SHOULD NOT BE RELEASED WITHOUT REVIEW OF THE SERVICE PROVIDER.  Adolescent Medicine Consultation Follow-Up Visit Jessica [name]  is a 20 y.o. female referred by [name], MD here today for follow-up.    Previsit planning completed:  no  Growth Chart Viewed? no   History was provided by the patient and mother.  PCP Confirmed?  Yes, [name]  My Chart Activated?   Yes   HPI:    She was seen on 02/03/16 by Cardiology, Dr. Mayer Camel.  Atenolol increased to 25 mg and recommended increased fluid and sodium intake.  She has not seen Neuro; was discussed via telephone with Elveria Rising and encouraged to notify office if similar or new symptoms, but believed event was likely not a seizure based on reported history - per mom.  -cannot lie down when taking midodrine so she is napping a lot during the summer and does not want to add that at this time. No appreciable change in dizziness with standing.   Review of Systems  Constitutional: Negative for chills and fever.  HENT: Positive for sore throat (sinus drainage after beach trip). Negative for nosebleeds.   Eyes: Negative for blurred vision, double vision and pain.  Respiratory: Negative for cough and wheezing.   Cardiovascular: Negative for chest pain, palpitations and leg swelling.  Gastrointestinal: Positive for constipation, diarrhea, heartburn (with regurgitation) and nausea. Negative for vomiting.  Genitourinary: Negative.   Musculoskeletal: Negative for falls and myalgias.  Skin: Negative.   Neurological: Positive for dizziness (with standing) and headaches.  Endo/Heme/Allergies: Negative for environmental allergies. Does not bruise/bleed easily.  Psychiatric/Behavioral: Negative for substance abuse and suicidal ideas.    Patient's last menstrual period was 01/11/2016. Allergies  Allergen Reactions  . Amoxicillin Rash   Outpatient Medications Prior to Visit   Medication Sig Dispense Refill  . atenolol (TENORMIN) 25 MG tablet Take 0.5 tablets (12.5 mg total) by mouth daily.    . Calcium-Vitamin D-Vitamin K (VIACTIV PO) Take 1 tablet by mouth daily. Reported on 01/04/2016    . fludrocortisone (FLORINEF) 0.1 MG tablet Take 1 tablet by mouth daily 90 tablet 5  . FLUoxetine (PROZAC) 40 MG capsule TAKE 1 CAPSULE (40 MG TOTAL) BY MOUTH DAILY. 30 capsule 2  . FLUoxetine (PROZAC) 40 MG capsule TAKE 1 CAPSULE (40 MG TOTAL) BY MOUTH DAILY. 30 capsule 2  . fluticasone (FLONASE) 50 MCG/ACT nasal spray Place 2 sprays into both nostrils daily. 16 g 12  . ibuprofen (ADVIL,MOTRIN) 800 MG tablet Take 1 tablet (800 mg total) by mouth every 8 (eight) hours as needed for mild pain or moderate pain. 15 tablet 0  . JUNEL 1.5/30 1.5-30 MG-MCG tablet   5  . Multiple Vitamin (MULTIVITAMIN WITH MINERALS) TABS tablet Take 1 tablet by mouth daily.    Marland Kitchen OLANZapine (ZYPREXA) 2.5 MG tablet TAKE 1/2 TABLET BY MOUTH AT BEDTIME. 30 tablet 1  . polyethylene glycol (MIRALAX / GLYCOLAX) packet Take 17 g by mouth daily.    . ranitidine (ZANTAC) 150 MG tablet TAKE 1 TABLET BY MOUTH 2 TIMES DAILY. 30 tablet 3  . SUMAtriptan (IMITREX) 25 MG tablet Take 25 mg by mouth every 2 (two) hours as needed for migraine. May repeat in 2 hours if headache persists or recurs.    . Topiramate ER 150 MG CS24 TAKE 1 CAPSULE BY MOUTH AT NIGHTTIME 30 each 5   No facility-administered medications prior to visit.      Patient Active Problem  List   Diagnosis Date Noted  . Neck sprain and strain 01/07/2016  . Nonspecific abnormal results of endocrine function study 09/21/2015  . Concussion with loss of consciousness 07/23/2015  . Adjustment disorder with mixed anxiety and depressed mood 07/13/2015  . Chronic bilateral lower abdominal pain 05/24/2015  . Anorexia nervosa 04/29/2015  . Postural orthostatic tachycardia syndrome 09/26/2014  . Migraine without aura 10/30/2012  . Episodic tension type headache  10/30/2012  . Other specified cardiac dysrhythmias(427.89) 10/30/2012  . Orthostatic hypotension 10/30/2012   Confidentiality was discussed with the patient and if applicable, with caregiver as well.   The following portions of the patient's history were reviewed and updated as appropriate: allergies, current medications and problem list.  Physical Exam:  Vitals:   02/22/16 1136  BP: 106/64  Pulse: 85  Weight: 134 lb 6.4 oz (61 kg)  Height: 5' 5.75" (1.67 m)   BP Readings from Last 3 Encounters:  02/22/16 106/64  02/01/16 98/66  01/17/16 98/65     LMP 01/11/2016  Body mass index: body mass index is 21.86 kg/m. Growth percentile SmartLinks can only be used for patients less than 37 years old.  Wt Readings from Last 3 Encounters:  02/22/16 134 lb 6.4 oz (61 kg)  02/01/16 133 lb 9.6 oz (60.6 kg)  01/17/16 132 lb 6.4 oz (60.1 kg)   PHQ-SADS 10/18/2015 05/24/2015 04/29/2015  PHQ-15 12 11 12   GAD-7 4 4 7   PHQ-9 5 5 9   Suicidal Ideation No No No  Comment Somewhat difficult  Somewhat difficult Somewhat difficult   PHQ-SADS 04/13/2015  PHQ-15 14  GAD-7 9  PHQ-9 8  Suicidal Ideation No  Comment Somewhat difficult   Physical Exam  Constitutional: She is oriented to person, place, and time. No distress.  HENT:  Head: Normocephalic.  Mouth/Throat: No oropharyngeal exudate.  Oropharynx mild erythema   Eyes: EOM are normal. Pupils are equal, round, and reactive to light. No scleral icterus.  Neck: Normal range of motion. No thyromegaly present.  Cardiovascular: Normal rate and regular rhythm.   No murmur heard. Pulmonary/Chest: Effort normal and breath sounds normal.  Abdominal: Soft. Bowel sounds are normal. She exhibits no distension.  Musculoskeletal: Normal range of motion.  Lymphadenopathy:    She has no cervical adenopathy.  Neurological: She is alert and oriented to person, place, and time.  Skin: Skin is warm and dry. No rash noted.  Psychiatric: She has a  normal mood and affect.    Assessment/Plan: 1. Anorexia nervosa -weight up today; BP improved.  -continue with Mayra Reel, RD today for nutritional counseling  -advise if new/worsening symptoms develop with increased atenolol   2. Adjustment disorder with mixed anxiety and depressed mood -continue prozac 40 mg  -rescreen with phqsads at next OV   3. Postural orthostatic tachycardia syndrome -revisions to POC per cardiology appear to have improved BP; still somewhat symptomatic.  -reviewed POC from Care Everywhere with patient and she was able to state plan.  -she may elect to start midodrine if symptoms persists once school starts.   4. GERD, esophagitis presence not specified -persistent GERD symptoms and long history of persistent GI complaints.  -would like to refer to GI for consult. May need PCP referral. Discussed with mom and patient.   4. Screening for genitourinary condition -mod leuks, trace blood, negative nitrites, neg ketones; asymptomatic. Dirty catch.  - POCT urinalysis dipstick   Follow-up:  Return in about 4 weeks (around 03/21/2016) for with Christianne Dolin, FNP-C, [place] management.  Medical decision-making:  >15 minutes spent, more than 50% of appointment was spent discussing diagnosis and management of symptoms

## 2016-02-23 DIAGNOSIS — F5002 Anorexia nervosa, binge eating/purging type: Secondary | ICD-10-CM | POA: Diagnosis not present

## 2016-02-23 DIAGNOSIS — F419 Anxiety disorder, unspecified: Secondary | ICD-10-CM | POA: Diagnosis not present

## 2016-02-24 ENCOUNTER — Other Ambulatory Visit (INDEPENDENT_AMBULATORY_CARE_PROVIDER_SITE_OTHER): Payer: BLUE CROSS/BLUE SHIELD | Admitting: Pediatrics

## 2016-02-24 ENCOUNTER — Encounter: Payer: BLUE CROSS/BLUE SHIELD | Attending: Pediatrics | Admitting: *Deleted

## 2016-02-24 ENCOUNTER — Ambulatory Visit: Payer: BLUE CROSS/BLUE SHIELD | Admitting: Pediatrics

## 2016-02-24 DIAGNOSIS — R300 Dysuria: Secondary | ICD-10-CM

## 2016-02-24 DIAGNOSIS — Z713 Dietary counseling and surveillance: Secondary | ICD-10-CM | POA: Insufficient documentation

## 2016-02-24 DIAGNOSIS — F5000 Anorexia nervosa, unspecified: Secondary | ICD-10-CM

## 2016-02-24 DIAGNOSIS — F509 Eating disorder, unspecified: Secondary | ICD-10-CM | POA: Diagnosis not present

## 2016-02-24 LAB — POCT URINALYSIS DIPSTICK
Bilirubin, UA: NEGATIVE
Glucose, UA: NEGATIVE
Ketones, UA: NEGATIVE
NITRITE UA: NEGATIVE
PH UA: 7
PROTEIN UA: NEGATIVE
SPEC GRAV UA: 1.01
UROBILINOGEN UA: NEGATIVE

## 2016-02-24 MED ORDER — NITROFURANTOIN MONOHYD MACRO 100 MG PO CAPS: 100.0000 mg | ORAL_CAPSULE | Freq: Two times a day (BID) | ORAL | 0 refills | Status: AC

## 2016-02-24 NOTE — Progress Notes (Signed)
Patient presented from dietitian's office with 2 day hx of burning with urination. Urine dipstick obtained and positive for leuks and blood. Urine culture sent and macrobid prescribed. Will call patient with culture results.

## 2016-02-24 NOTE — Patient Instructions (Signed)
Try Dahl[facility] Client or Jessica [name] for primary care/eating disorder [address] [place], [place] [address] Main: [phone]  Keep up great work with eating And try to increse fluids
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
## 2016-02-26 LAB — URINE CULTURE: ORGANISM ID, BACTERIA: NO GROWTH

## 2016-03-14 DIAGNOSIS — F419 Anxiety disorder, unspecified: Secondary | ICD-10-CM | POA: Diagnosis not present

## 2016-03-14 DIAGNOSIS — F5002 Anorexia nervosa, binge eating/purging type: Secondary | ICD-10-CM | POA: Diagnosis not present

## 2016-03-15 ENCOUNTER — Ambulatory Visit: Payer: BLUE CROSS/BLUE SHIELD | Admitting: *Deleted

## 2016-03-15 ENCOUNTER — Encounter: Payer: BLUE CROSS/BLUE SHIELD | Admitting: *Deleted

## 2016-03-15 DIAGNOSIS — Z713 Dietary counseling and surveillance: Secondary | ICD-10-CM | POA: Diagnosis not present

## 2016-03-15 DIAGNOSIS — F509 Eating disorder, unspecified: Secondary | ICD-10-CM | POA: Diagnosis not present

## 2016-03-15 NOTE — Patient Instructions (Signed)
Try pronurish shake and bar for breakfast Or baked egg sandwich Or peanut butter sandwich Or peanut butter and nutella bagel - take it out of the freezer the night before   Lunches: Could do subway at [employer] at [employer] could be an option packing lunch: Joy knows how :-)  Throw in extra bar or shake  Wear compression socks and potentially shorts DRINK!!!!!!!!!!!!!!!!!!!!!!!!!!!!!!! Get larger bottle of water and maybe one with a filter 1 cup of water before you leave the house

## 2016-03-15 NOTE — Progress Notes (Signed)
//  Appointment start time: 1030  Appointment end time: 1115  Patient was seen on 03/15/16 for nutrition counseling pertaining to disordered eating.  She is accompanied by her mother  Primary care provider: Dr. Rana SnareLowe Therapist: Noni SaupeHeather Kitchen  Any other medical team members: Dr. Karlene EinsteinPerry/Adolescent medicine NPs Parents: Bonnita LevanJoy Thomas  Assessment: Finished antibiotic and symptoms improved temporarily, but are back.  She is following up next week with adolescent medicine.   Has not been keeping up with her water since the start of school.   clinicals are 12 hours.  Has 30 minute break.  This is 1/week.   Can't eat that early and lunch is a problem.   Other days are ok as far as eating Is seeing [facility] 03/28/16    Growth Metrics: Ideal BMI for age: 7521.6 BMI current: 21.86 % Ideal:  100% Previous growth data: weight/age  47-90th%; height/age at 75%; BMI/age 90-85% Goal BMI range based on growth chart data: 22-25 Goal weight range based on growth chart data: 130-145 lb No further weight restoration needed.  However, she is still restricting/appetite suppressed with Topomax.  When she eats intuitively, she will most likely gain a little more weight   Mental health diagnosis: AN, B/P subtype   Dietary assessment: mom says she's always been picky; currently following gluten-free diet  B: luna bar and cib L: 1/2 chicken bread and salad S: dry cinnamon rice chex D: 3 pieces french toast and chocolate milk S: grapes  B: cib and luna L: chicken nuggets, popcorn, and yogurt covered raisins S: trail mix D: over baked chicken, zucchini pasta with cheese, fried okra S: coffee  B: cib and luna L: brat and salad S: apple with pb D: black bean burger, sweet potato tots S: gluten free poptart and chocolate milk   Physical activity: none.  Walks to class and walks at clinical   Estimated energy intake: 1600-1700  Estimated energy needs: 2200 kcal for weight maintenance 275 g CHO 110 g  pro 73 g fat  Nutrition Diagnosis: NI-1.4 Inadequate energy intake As related to restricting and purging.  As evidenced by dietary recall  Intervention/Goals: praised overall progress.  Encouraged consistency.  Inadequate nutrition will cause physical symptoms, but also decreased focus/concentration and increased anxiety.  Discussed need for 2 snacks/day plus 3 meals.  Discussed options for breakfast prior to clinical, lunch packed for clinical, and the need for increased fluids.  Will ask Neysa BonitoChristy to write letter of medical necessity  Monitoring and Evaluation: Patient will follow up in 2 weeks.

## 2016-03-21 ENCOUNTER — Encounter: Payer: Self-pay | Admitting: Family

## 2016-03-21 ENCOUNTER — Ambulatory Visit (INDEPENDENT_AMBULATORY_CARE_PROVIDER_SITE_OTHER): Payer: BLUE CROSS/BLUE SHIELD | Admitting: Family

## 2016-03-21 VITALS — BP 99/67 | HR 72 | Ht 65.0 in | Wt 136.2 lb

## 2016-03-21 DIAGNOSIS — F4323 Adjustment disorder with mixed anxiety and depressed mood: Secondary | ICD-10-CM | POA: Diagnosis not present

## 2016-03-21 DIAGNOSIS — F5000 Anorexia nervosa, unspecified: Secondary | ICD-10-CM

## 2016-03-21 DIAGNOSIS — Z1389 Encounter for screening for other disorder: Secondary | ICD-10-CM

## 2016-03-21 DIAGNOSIS — R319 Hematuria, unspecified: Secondary | ICD-10-CM | POA: Diagnosis not present

## 2016-03-21 LAB — CBC
HCT: 43.4 % (ref 35.0–45.0)
Hemoglobin: 14.2 g/dL (ref 11.7–15.5)
MCH: 30.5 pg (ref 27.0–33.0)
MCHC: 32.7 g/dL (ref 32.0–36.0)
MCV: 93.3 fL (ref 80.0–100.0)
MPV: 11 fL (ref 7.5–12.5)
PLATELETS: 314 10*3/uL (ref 140–400)
RBC: 4.65 MIL/uL (ref 3.80–5.10)
RDW: 12.6 % (ref 11.0–15.0)
WBC: 7.9 10*3/uL (ref 3.8–10.8)

## 2016-03-21 LAB — POCT URINALYSIS DIPSTICK
BILIRUBIN UA: NEGATIVE
GLUCOSE UA: NEGATIVE
KETONES UA: NEGATIVE
Nitrite, UA: NEGATIVE
Spec Grav, UA: 1.02
UROBILINOGEN UA: NEGATIVE
pH, UA: 6

## 2016-03-21 NOTE — Progress Notes (Signed)
THIS RECORD MAY CONTAIN CONFIDENTIAL INFORMATION THAT SHOULD NOT BE RELEASED WITHOUT REVIEW OF THE SERVICE PROVIDER.  Adolescent Medicine Consultation Follow-Up Visit Jessica KitchenCiara J [name]  is a 20 y.o. female referred by [name], [name], MD here today for follow-up regarding disordered eating.    CC: urinary concerns, needs school note  HPI:   20 yo female followed for disordered eating,  GU:  -Hesitancy, doesn't fully empty bladder. Urinary frequency. Took Macrobid with improved symptoms.  -Waking up at least once if not more per night to urinate. PMH of kidney stone; FH of interstitial cystitis and kidney reflux. Constipation issue in the past; uses Miralax not regularly; reports either constipation or upset stomach hx. Seeing GI (Dr. Dulce Sellarutlaw) on 03/28/16 for persistent GERD and GI issues.  Not taking midodrine; tolerating increased atenolol per cardiology POC.  She is still dizzy daily, however not worsening. Denies falls or LOC.  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  She was given note today per recommendation of [name], RD for lunch, 2 snacks, and frequent water breaks during clinical rotations and those recommendations were reviewed.  Notes from her visit with [name] were reviewed. Her current state is consistent with notes from that OV. Acknowledges need for note for snacks, water breaks, and lunch.   Mom voices concerns over persistent urinary symptoms.   Discussed need to return to primary care or establish with adult PCP.   Review of Systems  Constitutional: Negative for malaise/fatigue.  Eyes: Negative for double vision.  Respiratory: Negative for shortness of breath.   Cardiovascular: Negative for chest pain and palpitations.  Gastrointestinal: Positive for constipation. Negative for abdominal pain, diarrhea, heartburn and vomiting.  Genitourinary: Positive for frequency, hematuria and urgency. Negative for dysuria and flank pain.  Musculoskeletal: Negative for joint pain and  myalgias.  Skin: Negative for rash.  Neurological: Positive for dizziness. Negative for headaches.  Endo/Heme/Allergies: Does not bruise/bleed easily.     No LMP recorded. Allergies  Allergen Reactions  . Amoxicillin Rash   Outpatient Medications Prior to Visit  Medication Sig Dispense Refill  . atenolol (TENORMIN) 25 MG tablet Take 0.5 tablets (12.5 mg total) by mouth daily.    . Calcium-Vitamin D-Vitamin K (VIACTIV PO) Take 1 tablet by mouth daily. Reported on 01/04/2016    . fludrocortisone (FLORINEF) 0.1 MG tablet Take 1 tablet by mouth daily 90 tablet 5  . FLUoxetine (PROZAC) 40 MG capsule TAKE 1 CAPSULE (40 MG TOTAL) BY MOUTH DAILY. 30 capsule 2  . FLUoxetine (PROZAC) 40 MG capsule TAKE 1 CAPSULE (40 MG TOTAL) BY MOUTH DAILY. 30 capsule 2  . fluticasone (FLONASE) 50 MCG/ACT nasal spray Place 2 sprays into both nostrils daily. 16 g 12  . ibuprofen (ADVIL,MOTRIN) 800 MG tablet Take 1 tablet (800 mg total) by mouth every 8 (eight) hours as needed for mild pain or moderate pain. 15 tablet 0  . JUNEL 1.5/30 1.5-30 MG-MCG tablet   5  . Multiple Vitamin (MULTIVITAMIN WITH MINERALS) TABS tablet Take 1 tablet by mouth daily.    Jessica [name]. OLANZapine (ZYPREXA) 2.5 MG tablet TAKE 1/2 TABLET BY MOUTH AT BEDTIME. 30 tablet 1  . polyethylene glycol (MIRALAX / GLYCOLAX) packet Take 17 g by mouth daily.    . ranitidine (ZANTAC) 150 MG tablet TAKE 1 TABLET BY MOUTH 2 TIMES DAILY. 30 tablet 3  . SUMAtriptan (IMITREX) 25 MG tablet Take 25 mg by mouth every 2 (two) hours as needed for migraine. May repeat in 2 hours if headache persists or recurs.    .Jessica [name]  Topiramate ER 150 MG CS24 TAKE 1 CAPSULE BY MOUTH AT NIGHTTIME 30 each 5   No facility-administered medications prior to visit.      Patient Active Problem List   Diagnosis Date Noted  . Neck sprain and strain 01/07/2016  . Nonspecific abnormal results of endocrine function study 09/21/2015  . Concussion with loss of consciousness 07/23/2015  . Adjustment  disorder with mixed anxiety and depressed mood 07/13/2015  . Chronic bilateral lower abdominal pain 05/24/2015  . Anorexia nervosa 04/29/2015  . Postural orthostatic tachycardia syndrome 09/26/2014  . Migraine without aura 10/30/2012  . Episodic tension type headache 10/30/2012  . Other specified cardiac dysrhythmias(427.89) 10/30/2012  . Orthostatic hypotension 10/30/2012    Confidentiality was discussed with the patient and if applicable, with caregiver as well.  The following portions of the patient's history were reviewed and updated as appropriate: allergies, current medications, past family history, past medical history, past social history and problem list.  EAT-26 03/21/2016  Total Score 14  Patient Report of Weight-Highest   Patient Report of Weight-Lowest   Patient Report of Weight-Ideal   Gone on eating binges where you feel that you may not be able to stop? Never  Ever made yourself sick (vomited) to control your weight or shape? Never  Ever used laxatives, diet pills or diuretics (water pills) to control your weight or shape?   Exercised more than 60 minutes a day to lose or to control your weight? Once a month or less  Lost 20 pounds or more in the past 6 months? No   EAT-26 10/18/2015  Total Score 18  Patient Report of Weight-Highest   Patient Report of Weight-Lowest   Patient Report of Weight-Ideal   Gone on eating binges where you feel that you may not be able to stop? Never  Ever made yourself sick (vomited) to control your weight or shape? Never  Ever used laxatives, diet pills or diuretics (water pills) to control your weight or shape? Never  Exercised more than 60 minutes a day to lose or to control your weight? Never  Lost 20 pounds or more in the past 6 months? No   EAT-26 07/13/2015  Total Score 28  Patient Report of Weight-Highest   Patient Report of Weight-Lowest   Patient Report of Weight-Ideal   Gone on eating binges where you feel that you may not  be able to stop? Never  Ever made yourself sick (vomited) to control your weight or shape? 2-6 times a week  Ever used laxatives, diet pills or diuretics (water pills) to control your weight or shape? Never  Exercised more than 60 minutes a day to lose or to control your weight? Never  Lost 20 pounds or more in the past 6 months? No   EAT-26 04/13/2015  Total Score 37  Patient Report of Weight-Highest 130 lb  Patient Report of Weight-Lowest 112 lb  Patient Report of Weight-Ideal 117 lb  Gone on eating binges where you feel that you may not be able to stop?   Ever made yourself sick (vomited) to control your weight or shape?   Ever used laxatives, diet pills or diuretics (water pills) to control your weight or shape?   Exercised more than 60 minutes a day to lose or to control your weight?   Lost 20 pounds or more in the past 6 months?    PHQ-SADS 03/21/2016 10/18/2015 05/24/2015  PHQ-15 11 12 11   GAD-7 5 4 4   PHQ-9 5 5  5  Suicidal Ideation No No No  Comment somewhat difficult  Somewhat difficult  Somewhat difficult   PHQ-SADS 04/29/2015 04/13/2015  PHQ-15 12 14   GAD-7 7 9   PHQ-9 9 8   Suicidal Ideation No No  Comment Somewhat difficult Somewhat difficult   Physical Exam:  Vitals:   03/21/16 1354  BP: 99/67  Pulse: 72  Weight: 136 lb 3.2 oz (61.8 kg)  Height: 5\' 5"  (1.651 m)   BP 99/67 (BP Location: Left Arm, Patient Position: Sitting, Cuff Size: Normal)   Pulse 72   Ht 5\' 5"  (1.651 m)   Wt 136 lb 3.2 oz (61.8 kg)   BMI 22.66 kg/m  Body mass index: body mass index is 22.66 kg/m. Growth percentile SmartLinks can only be used for patients less than 41 years old.  Wt Readings from Last 3 Encounters:  03/21/16 136 lb 3.2 oz (61.8 kg)  02/22/16 134 lb 6.4 oz (61 kg)  02/01/16 133 lb 9.6 oz (60.6 kg)   BP Readings from Last 3 Encounters:  03/21/16 99/67  02/22/16 106/64  02/01/16 98/66   Physical Exam  Constitutional: She is oriented to person, place, and time. She  appears well-developed.  HENT:  Head: Atraumatic.  Eyes: EOM are normal. Pupils are equal, round, and reactive to light.  Neck: No thyromegaly present.  Cardiovascular: Normal rate and regular rhythm.   No murmur heard. Pulmonary/Chest: Effort normal and breath sounds normal.  Abdominal: Soft. There is no tenderness.  Musculoskeletal: Normal range of motion. She exhibits no tenderness.  Lymphadenopathy:    She has no cervical adenopathy.  Neurological: She is alert and oriented to person, place, and time.  Skin: Skin is warm and dry. No rash noted.  Psychiatric: She has a normal mood and affect.  Vitals reviewed.   Assessment/Plan: 1. Anorexia nervosa -she is weight restored at present -discussed that she will need to ensure lunch, 2 snacks, and adequate fluid intake in order during clinic rotation.  -continue with RD in 2 weeks, follow up with [facility] in 4 weeks  -would consider stretching out appointments in future if stable at next OV     2. Hematuria -trace blood, trace protein; moderate leuks with clean catch.  -was treated with macrobid on 02/24/16 for s/s urinary burning x 2 days. UA at that time revealed trace blood, negative protein, negative nitrites; culture was sent and was negative for growth.  -due to trace blood on last four UA dipsticks, and hx of kidney stones, will send to urology for further evaluation. -of note, eats-26 reported exercising >60 minutes - new finding compared with last. With physical symptoms, not likely the result of excessive exercise, however will need to monitor this and not exclude from diff dx.  - Comprehensive metabolic panel - CBC - Ambulatory referral to Urology  3. Screening for genitourinary condition -reviewed; trace blood, trace protein; moderate leuks with clean catch.  -due to trace blood on last four UA dipsticks, negative culture, and hx of kidney stones, will send to urology for further evaluation.  - POCT urinalysis  dipstick  4. Adjustment disorder with mixed anxiety and depressed mood -PHQSADS reviewed    Follow-up:  Return in about 4 weeks (around 04/18/2016) for with Christianne Dolin, FNP-C, [place] management.   Medical decision-making:  >25 minutes spent face to face with patient with more than 50% of appointment spent discussing diagnosis, management, follow-up, and reviewing the plan of care as noted above.

## 2016-03-21 NOTE — Patient Instructions (Signed)
I will call you with lab results and we will go from there. I can send you to urology for further evaluation.     Cheryll CockayneStacy Burns, MD  [facility]  [address] [place], [place] [id number]  Phone: [phone] Fax: [phone]

## 2016-03-22 ENCOUNTER — Other Ambulatory Visit: Payer: Self-pay | Admitting: Family

## 2016-03-22 DIAGNOSIS — F5002 Anorexia nervosa, binge eating/purging type: Secondary | ICD-10-CM | POA: Diagnosis not present

## 2016-03-22 DIAGNOSIS — R319 Hematuria, unspecified: Secondary | ICD-10-CM | POA: Insufficient documentation

## 2016-03-22 DIAGNOSIS — F419 Anxiety disorder, unspecified: Secondary | ICD-10-CM | POA: Diagnosis not present

## 2016-03-22 LAB — COMPREHENSIVE METABOLIC PANEL
ALT: 31 U/L — ABNORMAL HIGH (ref 6–29)
AST: 20 U/L (ref 10–30)
Albumin: 4.6 g/dL (ref 3.6–5.1)
Alkaline Phosphatase: 44 U/L (ref 33–115)
BILIRUBIN TOTAL: 0.3 mg/dL (ref 0.2–1.2)
BUN: 14 mg/dL (ref 7–25)
CO2: 24 mmol/L (ref 20–31)
CREATININE: 1.02 mg/dL (ref 0.50–1.10)
Calcium: 10 mg/dL (ref 8.6–10.2)
Chloride: 108 mmol/L (ref 98–110)
GLUCOSE: 110 mg/dL — AB (ref 65–99)
Potassium: 4.8 mmol/L (ref 3.5–5.3)
SODIUM: 145 mmol/L (ref 135–146)
Total Protein: 7.4 g/dL (ref 6.1–8.1)

## 2016-03-28 DIAGNOSIS — R198 Other specified symptoms and signs involving the digestive system and abdomen: Secondary | ICD-10-CM | POA: Diagnosis not present

## 2016-03-28 DIAGNOSIS — R109 Unspecified abdominal pain: Secondary | ICD-10-CM | POA: Diagnosis not present

## 2016-03-28 DIAGNOSIS — F5002 Anorexia nervosa, binge eating/purging type: Secondary | ICD-10-CM | POA: Diagnosis not present

## 2016-03-28 DIAGNOSIS — R1310 Dysphagia, unspecified: Secondary | ICD-10-CM | POA: Diagnosis not present

## 2016-03-28 DIAGNOSIS — F419 Anxiety disorder, unspecified: Secondary | ICD-10-CM | POA: Diagnosis not present

## 2016-03-28 DIAGNOSIS — K219 Gastro-esophageal reflux disease without esophagitis: Secondary | ICD-10-CM | POA: Diagnosis not present

## 2016-03-29 ENCOUNTER — Ambulatory Visit: Payer: BLUE CROSS/BLUE SHIELD | Admitting: *Deleted

## 2016-03-29 ENCOUNTER — Encounter: Payer: BLUE CROSS/BLUE SHIELD | Attending: Pediatrics | Admitting: *Deleted

## 2016-03-29 DIAGNOSIS — Z713 Dietary counseling and surveillance: Secondary | ICD-10-CM | POA: Diagnosis not present

## 2016-03-29 DIAGNOSIS — F509 Eating disorder, unspecified: Secondary | ICD-10-CM | POA: Insufficient documentation

## 2016-03-29 DIAGNOSIS — R198 Other specified symptoms and signs involving the digestive system and abdomen: Secondary | ICD-10-CM | POA: Diagnosis not present

## 2016-03-29 NOTE — Progress Notes (Signed)
Appointment start time: 1100  Appointment end time: 1200  Patient was seen on 03/29/16 for nutrition counseling pertaining to disordered eating.  She is accompanied by her mother  Primary care provider: Dr. Rana SnareLowe Therapist: Noni SaupeHeather Kitchen  Any other medical team members: Dr. Karlene EinsteinPerry/Adolescent medicine NPs Parents: Bonnita LevanJoy Thomas  Assessment: Gave clinical supervisor the note and that is going just fine.  She is supportive. She is allowed ot take water whenever.  this person seems laid back and accommodating.   GI changed medications.  Also added benefiber twice daily.  Continue miralax, prn and 64 oz water Also gave recommendations on GERD diet.   Cancelled [employer] membership Is looking for PCP and getting urology referal Packing lunches is going fine    Growth Metrics: Ideal BMI for age: 121.6 BMI current: 21.86 % Ideal:  100% Previous growth data: weight/age  19-90th%; height/age at 75%; BMI/age 71-85% Goal BMI range based on growth chart data: 22-25 Goal weight range based on growth chart data: 130-145 lb No further weight restoration needed.  However, she is still restricting/appetite suppressed with Topomax.  When she eats intuitively, she will most likely gain a little more weight   Mental health diagnosis: AN, B/P subtype   Dietary assessment: mom says she's always been picky; currently following gluten-free diet  B: cocoa pebbles with soy milk L: greek chicken with quinoa, tomoatoes,spinach, feta cheese S: luna bar D: grilled chicken tenders, stewed apples, baked sweet potato S: apple with PB Beverages: 20 oz water, propel, 12 oz water  B: 2 frozen waffles L: fajita ACP S: luna bar D: black bean burger with cheese and guac, sweet potato tot S: blueberry rice chex  Beverages: unsure  B: banana nut muffing and CIB S: nature valley protein bar L: salad wit hgrilled chickena nd cheese, gluten free crackers.  Propel S: pumpkin machiato D: vegetable soup with crackers S: banana  with PB   Physical activity: walks with mom, but nothing else   Estimated energy intake: 1600-1700  Estimated energy needs: 2200 kcal for weight maintenance 275 g CHO 110 g pro 73 g fat  Nutrition Diagnosis: NI-1.4 Inadequate energy intake As related to restricting and purging.  As evidenced by dietary recall  Intervention/Goals: praised overall progress.  Encouraged consistency.   Discussed GI recommendations.  Since new medication was just started, can wait until that medication is due to take effect before starting dietary modifications.  Gave GERD recommendations from the Academy of [employer] Dietetics and encouraged that list.  If needed, will limit foods and incorporate them back in as tolerated.  Raise head of bed Challenged cognitive distortion of rapid weight gain   Monitoring and Evaluation: Patient will follow up in 2 weeks.

## 2016-04-05 DIAGNOSIS — F5002 Anorexia nervosa, binge eating/purging type: Secondary | ICD-10-CM | POA: Diagnosis not present

## 2016-04-05 DIAGNOSIS — F419 Anxiety disorder, unspecified: Secondary | ICD-10-CM | POA: Diagnosis not present

## 2016-04-11 DIAGNOSIS — F419 Anxiety disorder, unspecified: Secondary | ICD-10-CM | POA: Diagnosis not present

## 2016-04-11 DIAGNOSIS — F5002 Anorexia nervosa, binge eating/purging type: Secondary | ICD-10-CM | POA: Diagnosis not present

## 2016-04-12 ENCOUNTER — Ambulatory Visit (INDEPENDENT_AMBULATORY_CARE_PROVIDER_SITE_OTHER): Payer: BLUE CROSS/BLUE SHIELD | Admitting: Pediatrics

## 2016-04-12 ENCOUNTER — Ambulatory Visit: Payer: BLUE CROSS/BLUE SHIELD | Admitting: *Deleted

## 2016-04-12 ENCOUNTER — Encounter: Payer: Self-pay | Admitting: Pediatrics

## 2016-04-12 ENCOUNTER — Encounter: Payer: BLUE CROSS/BLUE SHIELD | Admitting: *Deleted

## 2016-04-12 ENCOUNTER — Telehealth: Payer: Self-pay | Admitting: *Deleted

## 2016-04-12 VITALS — BP 93/62 | HR 78 | Ht 66.0 in | Wt 137.0 lb

## 2016-04-12 DIAGNOSIS — I951 Orthostatic hypotension: Secondary | ICD-10-CM

## 2016-04-12 DIAGNOSIS — Z1389 Encounter for screening for other disorder: Secondary | ICD-10-CM

## 2016-04-12 DIAGNOSIS — R000 Tachycardia, unspecified: Secondary | ICD-10-CM

## 2016-04-12 DIAGNOSIS — F411 Generalized anxiety disorder: Secondary | ICD-10-CM

## 2016-04-12 DIAGNOSIS — G90A Postural orthostatic tachycardia syndrome (POTS): Secondary | ICD-10-CM

## 2016-04-12 DIAGNOSIS — Z713 Dietary counseling and surveillance: Secondary | ICD-10-CM | POA: Diagnosis not present

## 2016-04-12 DIAGNOSIS — F5000 Anorexia nervosa, unspecified: Secondary | ICD-10-CM

## 2016-04-12 DIAGNOSIS — F509 Eating disorder, unspecified: Secondary | ICD-10-CM | POA: Diagnosis not present

## 2016-04-12 LAB — POCT URINALYSIS DIPSTICK
Bilirubin, UA: NEGATIVE
GLUCOSE UA: NEGATIVE
Ketones, UA: NEGATIVE
NITRITE UA: NEGATIVE
PROTEIN UA: NEGATIVE
RBC UA: NEGATIVE
Spec Grav, UA: 1.01
UROBILINOGEN UA: NEGATIVE
pH, UA: 7

## 2016-04-12 NOTE — Progress Notes (Signed)
THIS RECORD MAY CONTAIN CONFIDENTIAL INFORMATION THAT SHOULD NOT BE RELEASED WITHOUT REVIEW OF THE SERVICE PROVIDER.  Adolescent Medicine Consultation Follow-Up Visit Jessica [name]  is a 20 y.o. female referred by [name], MD here today for follow-up regarding disordered eating.    Last seen in [facility] on 03/21/16 by Christianne Dolin NP and on 03/29/16 by Danise Edge, nutritionist. Here for planned 1 month follow up.   Chief Complaint  Patient presents with  . Follow-up    DE mgmt     HPI:  Jessica [name] is a 20 year old female here for follow up of disordered eating. She presents with mom.  She has been doing well overall. 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 She started feeling dizziness "coming on" while standing in the procedure room and tried bending her knees etc. As soon as it was over she excused herself and planned to go back up to the floor for her water and a snack, but she only made it a few steps down the hallway before she "blacked out" and fell to the ground. Someone brought her orange juice and she felt better after. She continued the work day, taking it easy mostly at a computer. Felt fine. No presyncopal or syncopal symptoms since. She wears compression socks and tries to stay hydrated when on clinicals, but has to store her water bottle in the break room so can't drink it throughout the day. The episode felt very different than her typical POTS immediate "face plant" with no warning syncopal episodes. No recent POTS episodes.  Meals - she and mom endorse that [name] eats breakfast, lunch, dinner and 2 snacks every day, although breakfast is hard on clinical days since she has to leave the house by 6 am. She denies having any current excessive worries or anxieties  surrounding food.  Exercise - she and mom have been walking some days per week at a small track near their house (5 laps = mile). They usually walk for about 25 minutes, and mom says they walk about 1.5-2 miles. [name] did do a run/walk by herself one day recently, and completed 10 total laps or 2 miles. She said she did it just because she wanted to do something, but says "I don't really like running" and doesn't want to get into running consistently at this time. She like swimming but the family recently stopped their [employer] since they weren't using it often.   Mom has an upcoming hysterectomy next week.  No LMP recorded. Allergies  Allergen Reactions  . Amoxicillin Rash   Outpatient Medications Prior to Visit  Medication Sig Dispense Refill  . atenolol (TENORMIN) 25 MG tablet Take 0.5 tablets (12.5 mg total) by mouth daily.    . Calcium-Vitamin D-Vitamin K (VIACTIV PO) Take 1 tablet by mouth daily. Reported on 01/04/2016    . Dexlansoprazole 30 MG capsule Take 60 mg by mouth daily.     . fludrocortisone (FLORINEF) 0.1 MG tablet Take 1 tablet by mouth daily 90 tablet 5  . FLUoxetine (PROZAC) 40 MG capsule TAKE 1 CAPSULE (40 MG TOTAL) BY MOUTH DAILY. 30 capsule 2  . FLUoxetine (PROZAC) 40 MG capsule TAKE 1 CAPSULE (40 MG TOTAL) BY MOUTH DAILY. 30 capsule 2  . fluticasone (FLONASE) 50 MCG/ACT nasal spray Place 2  sprays into both nostrils daily. 16 g 12  . ibuprofen (ADVIL,MOTRIN) 800 MG tablet Take 1 tablet (800 mg total) by mouth every 8 (eight) hours as needed for mild pain or moderate pain. 15 tablet 0  . JUNEL 1.5/30 1.5-30 MG-MCG tablet   5  . Multiple Vitamin (MULTIVITAMIN WITH MINERALS) TABS tablet Take 1 tablet by mouth daily.    Marland Kitchen OLANZapine (ZYPREXA) 2.5 MG tablet TAKE 1/2 TABLET BY MOUTH AT BEDTIME. 30 tablet 1  . polyethylene glycol (MIRALAX / GLYCOLAX) packet Take 17 g by mouth daily.    . SUMAtriptan (IMITREX) 25 MG tablet Take 25 mg by mouth every 2 (two) hours as  needed for migraine. May repeat in 2 hours if headache persists or recurs.    . Topiramate ER 150 MG CS24 TAKE 1 CAPSULE BY MOUTH AT NIGHTTIME 30 each 5  . ranitidine (ZANTAC) 150 MG tablet TAKE 1 TABLET BY MOUTH 2 TIMES DAILY. (Patient not taking: Reported on 03/29/2016) 30 tablet 3   No facility-administered medications prior to visit.      Patient Active Problem List   Diagnosis Date Noted  . Hematuria 03/22/2016  . Nonspecific abnormal results of endocrine function study 09/21/2015  . Adjustment disorder with mixed anxiety and depressed mood 07/13/2015  . Chronic bilateral lower abdominal pain 05/24/2015  . Anorexia nervosa 04/29/2015  . Postural orthostatic tachycardia syndrome 09/26/2014  . Migraine without aura 10/30/2012  . Episodic tension type headache 10/30/2012  . Orthostatic hypotension 10/30/2012    The following portions of the patient's history were reviewed and updated as appropriate: allergies, current medications, past family history, past medical history, past social history and problem list.   Physical Exam:  Vitals:   04/12/16 1018  BP: 93/62  Pulse: 78  Weight: 137 lb (62.1 kg)  Height: 5\' 6"  (1.676 m)   BP 93/62   Pulse 78   Ht 5\' 6"  (1.676 m)   Wt 137 lb (62.1 kg)   BMI 22.11 kg/m  Body mass index: body mass index is 22.11 kg/m. Growth percentile SmartLinks can only be used for patients less than 58 years old.   Filed Weights   04/12/16 1018  Weight: 137 lb (62.1 kg)     Previous weights: 136, 134, 133 lb   Physical Exam Constitutional: Well hydrated, well appearing adolescent female, pleasant and smiling HENT:  Head: Atraumatic.  Eyes: Pupils are equal, round, and reactive to light.  Neck: No lymphadenopathy present.  Cardiovascular: Normal rate and regular rhythm. No murmur heard. Pulmonary/Chest: Effort normal and breath sounds normal.  Abdominal: Soft. There is no tenderness.  Neurological: She is alert. No focal deficits. Skin:  Skin is warm and dry. No rash noted.  Psychiatric: She has a normal mood and affect.  Vitals reviewed.   Assessment/Plan: 1. Anorexia nervosa - Weight stable, continues to maintain BMI ~22 for past several visits - Discussed strategies for optimal hydration and nutrition especially while on clinical rotations. Focus on breakfast, lunch, dinner and 2 snacks every day. Recommended salted almonds as a good snack option while on clinicals - Discussed exercise - ok with her current walking and/or walk/runs or small runs if occasional. Asked Jadaya to let us know if she plans to start running more consistently and/or train for a 5k etc - Patient will see RD today, will follow up with [facility] in 6 weeks (stretching out appts from 4 weeks apart) - Would consider stretching out appointments further (i.e., to 8 weeks after  next visit) in future if stable at next OV    2. Hematuria - resolved today - Neg RBCs today - Previous four UA dipsticks with trace blood, and hx of kidney stones, discussed sending to urology for further evaluation at last visit and placed referral, though they have not been; consider urology appt follow-through in future if hematuria returns  3. Screening for genitourinary condition - POCT urinalysis dipstick - Reviewed; neg blood, neg protein; trace leukocytes with clean catch - As above; urine looks good today; consider urology in future in hematuria returns    4. Adjustment disorder with mixed anxiety and depressed mood - Mood stable; denies significant depression/anxiety symptoms or worries today - PHQSADS at next visit  Follow-up:  Return in about 6 weeks (around 05/24/2016) for DE f/u w Christianne Dolinhristy Millican NP.    Medical decision-making:  >25 minutes spent face to face with patient with more than 50% of appointment spent discussing diagnosis, management, follow-up, and reviewing the plan of care as noted above.     Donato SchultzMarley Berlin Mokry, MD Pediatrics Resident [facility]

## 2016-04-12 NOTE — Telephone Encounter (Signed)
Therapist states: Jessica [name] "passed out on Thursday during clinicals.  When she had some range juice she felt better.  She tends to minimize that it was about restrictive eating and over exercising. 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  Has also been walking 2 miles/day and started to run/walk last week.  She's exhausted and sleeping more."

## 2016-04-12 NOTE — Progress Notes (Signed)
Appointment start time: 1115   Appointment end time: 1200  Patient was seen on 04/12/16 for nutrition counseling pertaining to disordered eating.  She is accompanied by her mother  Primary care provider: Dr. Rana SnareLowe Therapist: Noni SaupeHeather Kitchen  Any other medical team members: Dr. Karlene EinsteinPerry/Adolescent medicine NPs Parents: Bonnita LevanJoy Thomas  Assessment Toneisha states that things are going well.  She did pass out in clinicals after standing during a procedure for over 2 hours.  She did recognize the symptoms and asked to step out, but she passed out before she could get out all the way.  Orange juice did make her feel better She was wearing her compression socks and shorts and tried to move around to get her blood flowing.  She was dehydrated,however.    Thinks she is eating fine .  Sometimes she doesn't get her snacks as she forgets  She's been walking with mom, but went walk/running alone one day and felt sick.  She was trying to walk daily with mom, but she hasn't been walking as much lately She is concerned about the weight at her GI office that was much more than her normal weight  Still constipated.  benefiber doesn't seem to help.   New reflux medicine is helping Is doing less chocolate, but not cut out all the way.  No other dietary changes.       Growth Metrics: Ideal BMI for age: 6721.6 BMI current: 22.11 % Ideal:  100% Previous growth data: weight/age  71-90th%; height/age at 75%; BMI/age 51-85% Goal BMI range based on growth chart data: 22-25 Goal weight range based on growth chart data: 130-145 lb No further weight restoration needed.  However, she is still restricting/appetite suppressed with Topomax.  When she eats intuitively, she will most likely gain a little more weight   Mental health diagnosis: AN, B/P subtype   Dietary assessment: mom says she's always been picky; currently following gluten-free diet B: pronourish and luna L: pizza S: apple with nutella D: grilled chicken  with bacon and cheese on gluten free bun with lettuce and tomato S: : popcorn  B: CIB and luna L: chipotle chicken bowl with bronw rice, black beans, chicken, corn slasa, lettuce and tomoatoe , sour crea,, cheese, chips S: apple with nutella D; 2 slices pizza S: blueberry muffin   Physical activity: walks with mom, but nothing else   Estimated energy intake: 1600-1700  Estimated energy needs: 2200 kcal for weight maintenance 275 g CHO 110 g pro 73 g fat  Nutrition Diagnosis: NI-1.4 Inadequate energy intake As related to restricting and purging.  As evidenced by dietary recall  Intervention/Goals: praised overall progress.  Encouraged consistency, especially with snacks and fluids,.  Discussed having electrolyte beverage before long procedure to ensure hydration and adequate blood pressure.  Suggested hard candies to suck on during procedure for some extra glucose Challenged cognitive distortion of rapid weight gain Medication seems to be improving reflux, will continue eto monitor, but no need to further restrict diet Try 400 mg magnesium for constipation   Monitoring and Evaluation: Patient will follow up in 2 weeks.

## 2016-04-12 NOTE — Patient Instructions (Addendum)
It was great seeing you guys today. Keep focusing on good hydration and regular meals and snacks, especially on clinical days. Consider [employer] and salted almonds. We'll see you back in 6 weeks.

## 2016-04-14 ENCOUNTER — Other Ambulatory Visit: Payer: Self-pay | Admitting: Pediatrics

## 2016-04-18 DIAGNOSIS — F5002 Anorexia nervosa, binge eating/purging type: Secondary | ICD-10-CM | POA: Diagnosis not present

## 2016-04-18 DIAGNOSIS — F419 Anxiety disorder, unspecified: Secondary | ICD-10-CM | POA: Diagnosis not present

## 2016-04-25 DIAGNOSIS — F419 Anxiety disorder, unspecified: Secondary | ICD-10-CM | POA: Diagnosis not present

## 2016-04-25 DIAGNOSIS — F5002 Anorexia nervosa, binge eating/purging type: Secondary | ICD-10-CM | POA: Diagnosis not present

## 2016-04-26 ENCOUNTER — Ambulatory Visit: Payer: BLUE CROSS/BLUE SHIELD | Admitting: *Deleted

## 2016-05-01 ENCOUNTER — Telehealth (INDEPENDENT_AMBULATORY_CARE_PROVIDER_SITE_OTHER): Payer: Self-pay

## 2016-05-01 NOTE — Telephone Encounter (Signed)
Patient's mother called stating that she ha not been able to get Topirimate ER 150 MG for the patient. She states that her pharmacy says the supplier is out of stock. She states that she called 5 other pharmacies and no one had it. She is requesting a call back with an alternative.   CB:[phone]

## 2016-05-01 NOTE — Telephone Encounter (Signed)
I called Mom and offered her samples of Qudexy XR 150mg . She accepted and I will put them at front desk for her. TG

## 2016-05-02 DIAGNOSIS — F5002 Anorexia nervosa, binge eating/purging type: Secondary | ICD-10-CM | POA: Diagnosis not present

## 2016-05-02 DIAGNOSIS — F419 Anxiety disorder, unspecified: Secondary | ICD-10-CM | POA: Diagnosis not present

## 2016-05-03 ENCOUNTER — Ambulatory Visit: Payer: BLUE CROSS/BLUE SHIELD | Admitting: *Deleted

## 2016-05-03 ENCOUNTER — Encounter: Payer: BLUE CROSS/BLUE SHIELD | Attending: Pediatrics | Admitting: *Deleted

## 2016-05-03 DIAGNOSIS — F509 Eating disorder, unspecified: Secondary | ICD-10-CM | POA: Diagnosis not present

## 2016-05-03 DIAGNOSIS — Z713 Dietary counseling and surveillance: Secondary | ICD-10-CM | POA: Diagnosis not present

## 2016-05-03 NOTE — Patient Instructions (Addendum)
Talk to heather about relaxation Ensure adequate hydration by sipping and snoozing/sip and dressing and sit and driving Ok to try large propel and 2 bars instead of bar and carnation Try miralax daily with 400 mg magnesium Please call chelle jeffery or sarah weber for primary care [phone]

## 2016-05-03 NOTE — Progress Notes (Signed)
Appointment start time: 1115   Appointment end time: 1200  Patient was seen on 05/03/16 for nutrition counseling pertaining to disordered eating.  She is accompanied by her mother  Primary care provider: Dr. Rana SnareLowe Therapist: Noni SaupeHeather Kitchen  Any other medical team members: Dr. Karlene EinsteinPerry/Adolescent medicine NPs Parents: Bonnita LevanJoy Thomas  Assessment topomax supplies ran out.  Has 2 month sample of Qudexy XR 150mg  Got flu shot Is struggling with her grades at school.  She is concerned about failing.   There is some turnover with professors which makes it hard to get help Is seeing Heather weekly. She is not managing stress well. Is not able to verbalize relaxation or coping mechanisms.   She skipped lunch last week (once) No more fainting.  Still dizzy.  Hasn't been back to med/surg clinicals so psych clinicals aren't as physically demanding Has not been drinking well as she is feeling overwehelmed Upset stomach last 4 days.  Hasn't been eating as much as a result.  Thinks the stomach is from stress Otherwise still constipated.  Doesn't take benefiber or Miralax.  Is doing magnesium 400 mg Ran out of reflux medicine and was having more symptoms.  She is back on medication and doing better Has not looked for adult PCP   Growth Metrics: Ideal BMI for age: 6821.6 BMI current: 22.11 % Ideal:  100% Previous growth data: weight/age  34-90th%; height/age at 75%; BMI/age 70-85% Goal BMI range based on growth chart data: 22-25 Goal weight range based on growth chart data: 130-145 lb No further weight restoration needed.  However, she is still restricting/appetite suppressed with Topomax.  When she eats intuitively, she will most likely gain a little more weight   Mental health diagnosis: AN, B/P subtype   Dietary assessment: mom says she's always been picky; currently following gluten-free diet B: 2 toasted waffles,  L chicken vegetable soup S: banana with nutella D: 3 grilled nuggets S: fruit  tray S: Luna bar  Stomachache is affecting her appetite  B: Luna bar, CFA greek yogurt parfait  L: chicken lettuce wrap and sweet potato chips D: chicken enchilada.  Chip and salsa S: chocolate covered strawberries S: tangarine  B: cococa pebbles L: tomato soup, panera slaad S: Luna bar D: grilled chicken letuce wrap nutella shake yosef cookie (GF, vegan cookie)   Physical activity: none   Estimated energy intake: 1600-1700  Estimated energy needs: 2200 kcal for weight maintenance 275 g CHO 110 g pro 73 g fat  Nutrition Diagnosis: NI-1.4 Inadequate energy intake As related to restricting and purging.  As evidenced by dietary recall  Intervention/Goals: discussed ways to manage her stress.  She was not able to think of any. Suggested talking with Herbert SetaHeather about ways to manage her stress so she can eat adequately.  If not, will advise an increase in medication at next office visit. Discussed ways to ensure adequate hydration.    Talk to heather about relaxation Ensure adequate hydration by sipping and snoozing/sip and dressing and sit and driving Ok to try large propel and 2 bars instead of bar and carnation Try miralax daily with 400 mg magnesium Please call chelle jeffery or sarah weber for primary care [phone]   Monitoring and Evaluation: Patient will follow up in 3 weeks.

## 2016-05-08 ENCOUNTER — Other Ambulatory Visit: Payer: Self-pay | Admitting: Family

## 2016-05-08 DIAGNOSIS — F509 Eating disorder, unspecified: Secondary | ICD-10-CM

## 2016-05-09 DIAGNOSIS — F419 Anxiety disorder, unspecified: Secondary | ICD-10-CM | POA: Diagnosis not present

## 2016-05-09 DIAGNOSIS — F5002 Anorexia nervosa, binge eating/purging type: Secondary | ICD-10-CM | POA: Diagnosis not present

## 2016-05-11 ENCOUNTER — Other Ambulatory Visit (HOSPITAL_COMMUNITY): Payer: Self-pay | Admitting: Gastroenterology

## 2016-05-11 DIAGNOSIS — R1310 Dysphagia, unspecified: Secondary | ICD-10-CM

## 2016-05-11 DIAGNOSIS — R109 Unspecified abdominal pain: Secondary | ICD-10-CM | POA: Diagnosis not present

## 2016-05-11 DIAGNOSIS — K219 Gastro-esophageal reflux disease without esophagitis: Secondary | ICD-10-CM | POA: Diagnosis not present

## 2016-05-11 DIAGNOSIS — R1084 Generalized abdominal pain: Secondary | ICD-10-CM

## 2016-05-11 DIAGNOSIS — R198 Other specified symptoms and signs involving the digestive system and abdomen: Secondary | ICD-10-CM | POA: Diagnosis not present

## 2016-05-16 DIAGNOSIS — F5002 Anorexia nervosa, binge eating/purging type: Secondary | ICD-10-CM | POA: Diagnosis not present

## 2016-05-16 DIAGNOSIS — F419 Anxiety disorder, unspecified: Secondary | ICD-10-CM | POA: Diagnosis not present

## 2016-05-18 ENCOUNTER — Ambulatory Visit (HOSPITAL_COMMUNITY): Payer: BLUE CROSS/BLUE SHIELD

## 2016-05-23 DIAGNOSIS — F5002 Anorexia nervosa, binge eating/purging type: Secondary | ICD-10-CM | POA: Diagnosis not present

## 2016-05-23 DIAGNOSIS — F419 Anxiety disorder, unspecified: Secondary | ICD-10-CM | POA: Diagnosis not present

## 2016-05-24 ENCOUNTER — Encounter: Payer: BLUE CROSS/BLUE SHIELD | Attending: Pediatrics | Admitting: *Deleted

## 2016-05-24 ENCOUNTER — Ambulatory Visit: Payer: BLUE CROSS/BLUE SHIELD | Admitting: *Deleted

## 2016-05-24 DIAGNOSIS — Z713 Dietary counseling and surveillance: Secondary | ICD-10-CM | POA: Diagnosis not present

## 2016-05-24 DIAGNOSIS — F509 Eating disorder, unspecified: Secondary | ICD-10-CM | POA: Insufficient documentation

## 2016-05-24 NOTE — Progress Notes (Signed)
Appointment start time: 1100  Appointment end time: 1200  Patient was seen on 05/24/16 for nutrition counseling pertaining to disordered eating.  She is accompanied by her mother  Primary care provider: Dr. Rana SnareLowe Therapist: Noni SaupeHeather Kitchen  Any other medical team members: Dr. Karlene EinsteinPerry/Adolescent medicine NPs Parents: Bonnita LevanJoy Thomas  Assessment Stopped zyprexa about 3 weeks ago.  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  She says she stopped taking it before then because it wasn't really helping, but that lesson in school solidified that she was not going to take it anymore.  She knows her weight and knows she has not gained excessive weight.  She was at the GI recently where she learned she actually has lost weight.  When questioned about her weight loss, she states she is having normal BM .  Started probiotic per GI recommendation and that is why she lost weight. Dietary recall reveals inadequate intake.    She reports hair loss and fatigue.  She is tired regularly and falls asleep many afternoons.  Getting out of bed is a challenge.  She overslept yesterday afternoon and almost missed her therapist appointment.  She did miss her lunch that day.  She is not experiencing normal hunger cues anymore and therefore is using that as a reason not to eat.  Anxiety is high due to school.  She is also very busy studying and is using that as a reason not to follow meal plan.  Yesterday she did not eat peanut butter with her banana because it took too long to find and she "needed to study"  Hasn't been logging her foods because "she's too busy with school."  Is attempting to establish primary care, but running into roadblocks   Growth Metrics: Ideal BMI for age: 5121.6 BMI current: 21.7 % Ideal:  100% Previous growth data: weight/age  19-90th%; height/age at 75%; BMI/age 70-85% Goal BMI range based on growth chart data: 22-25 Goal weight range based on growth chart data: 130-145 lb No further  weight restoration needed.  However, she is still restricting/appetite suppressed with Topomax.  When she eats intuitively, she will most likely gain a little more weight   Mental health diagnosis: AN, B/P subtype   Dietary assessment: mom says she's always been picky; currently following gluten-free diet B: luna bar L: pasta with meat sauce D: grilled nuggets (8) fruit cup S: brownie Beverages: water and sprite  B: luna and CIB L: vegetable soup, clementine, pretzel with PB S: nature valley protein bar D: taco soup, cornbread S: banana   Physical activity: none   Estimated energy intake: 1600-1700  Estimated energy needs: 2200 kcal for weight maintenance 275 g CHO 110 g pro 73 g fat  Nutrition Diagnosis: NI-1.4 Inadequate energy intake As related to restricting and purging.  As evidenced by dietary recall  Intervention/Goals: Reviewed how restricting affects physical and mental functioning.  Reviewed how she felt when she first started treatment.  She was surprised how similarly she feels now.  Reiterated importance of adequate nutrition.  She may not have normal hunger cues, but still needs to eat regardless.  Advised severity of her situation and risk for full relapse.  Adequate nutrition will fuel her mind to ward off creeping ED thoughts.  Please talk with Herbert SetaHeather about coping skills.   Please talk with Neysa BonitoChristy about increasing medication.  Do not ever make med changes without discussing with provider again.  Discussed primary care and future plans for ED treatment.  [facility] Sarah  Monitoring and Evaluation: Patient will follow up in 2 weeks.

## 2016-05-25 ENCOUNTER — Ambulatory Visit (HOSPITAL_COMMUNITY)
Admission: RE | Admit: 2016-05-25 | Discharge: 2016-05-25 | Disposition: A | Discharge status: Home / Self Care | Payer: BLUE CROSS/BLUE SHIELD | Source: Ambulatory Visit | Attending: Gastroenterology | Admitting: Gastroenterology

## 2016-05-25 ENCOUNTER — Encounter: Payer: Self-pay | Admitting: Family

## 2016-05-25 DIAGNOSIS — K219 Gastro-esophageal reflux disease without esophagitis: Secondary | ICD-10-CM | POA: Diagnosis not present

## 2016-05-25 DIAGNOSIS — R1310 Dysphagia, unspecified: Secondary | ICD-10-CM | POA: Insufficient documentation

## 2016-05-25 DIAGNOSIS — K224 Dyskinesia of esophagus: Secondary | ICD-10-CM | POA: Insufficient documentation

## 2016-05-25 DIAGNOSIS — R1084 Generalized abdominal pain: Secondary | ICD-10-CM | POA: Insufficient documentation

## 2016-05-29 ENCOUNTER — Other Ambulatory Visit: Payer: Self-pay | Admitting: Family

## 2016-05-30 ENCOUNTER — Encounter: Payer: Self-pay | Admitting: Family

## 2016-05-30 ENCOUNTER — Ambulatory Visit (INDEPENDENT_AMBULATORY_CARE_PROVIDER_SITE_OTHER): Payer: BLUE CROSS/BLUE SHIELD | Admitting: Family

## 2016-05-30 VITALS — BP 101/67 | HR 78 | Ht 65.35 in | Wt 132.6 lb

## 2016-05-30 DIAGNOSIS — F4323 Adjustment disorder with mixed anxiety and depressed mood: Secondary | ICD-10-CM | POA: Diagnosis not present

## 2016-05-30 DIAGNOSIS — F5000 Anorexia nervosa, unspecified: Secondary | ICD-10-CM

## 2016-05-30 DIAGNOSIS — R319 Hematuria, unspecified: Secondary | ICD-10-CM

## 2016-05-30 DIAGNOSIS — F419 Anxiety disorder, unspecified: Secondary | ICD-10-CM | POA: Diagnosis not present

## 2016-05-30 DIAGNOSIS — Z1389 Encounter for screening for other disorder: Secondary | ICD-10-CM

## 2016-05-30 LAB — POCT URINALYSIS DIPSTICK
BILIRUBIN UA: NEGATIVE
GLUCOSE UA: NEGATIVE
Ketones, UA: NEGATIVE
LEUKOCYTES UA: NEGATIVE
NITRITE UA: NEGATIVE
Protein, UA: NEGATIVE
Spec Grav, UA: <=1.005
UROBILINOGEN UA: NEGATIVE
pH, UA: 7.5

## 2016-05-30 NOTE — Progress Notes (Signed)
THIS RECORD MAY CONTAIN CONFIDENTIAL INFORMATION THAT SHOULD NOT BE RELEASED WITHOUT REVIEW OF THE SERVICE PROVIDER.  Adolescent Medicine Consultation Follow-Up Visit Jessica [name]  is a 20 y.o. female referred bMarland Kitcheny [name], [name], MD here today for follow-up regarding AN, hematuria, and adjustment disorder with mixed anxiety/depressed mood.    Last seen in [facility] on 04/12/16 for same as above.   Plan at last visit included AN stable, stretching out appts to 6 weeks; hematuria had resolved, continue to monitor, did not see urology; mood stable.    - Pertinent Labs? Yes, continue to monitor    History was provided by the patient and mother.  PCP Confirmed?  Yes, Loyola MastMelissa Lowe, MD   My Chart Activated?   yes  Patient's personal or confidential phone number:  Enter confidential phone number in Family Comments section of [employer]  Chief Complaint  Patient presents with  . Follow-up  . Eating Disorder    HPI:    Stopped taking Zyprexa about 3 weeks ago. Not exercising because no time with school.  Grades are OK; had C's but pulled them up to Bs and As.  Having increased stomach pains; not eating as much; lots of stress with RN school. She believes this is related to anxiety. Of note, she has been seen by GI; had upper barium study -GERD; planning endoscopy for further r/o of strictures or esophogeal rings. She was started on dexlansoprazole 30mg  BID. Friend always asking if she has an eating disorder. This is a trigger for her. She is tearful as she discusses; she is unsure if this friend is projecting her own issues or prying.   Still seeing Herbert SetaHeather K every week for therapy.   Mom reports she saw her weight at recent office GI visit; knows she is 5 lbs lighter than prior.    Review of Systems  Constitutional: Negative for malaise/fatigue.  Eyes: Negative for double vision.  Respiratory: Negative for shortness of breath.   Cardiovascular: Negative for chest pain and  palpitations.  Gastrointestinal: Positive for abdominal pain. Negative for constipation, diarrhea, nausea and vomiting.  Genitourinary: Negative for dysuria, flank pain and hematuria.       Started period today   Musculoskeletal: Negative for joint pain and myalgias.  Skin: Negative for rash.  Neurological: Negative for dizziness and headaches.  Endo/Heme/Allergies: Does not bruise/bleed easily.  Psychiatric/Behavioral: Negative for depression and substance abuse. The patient is nervous/anxious.    Patient's last menstrual period was 05/30/2016 (exact date). Allergies  Allergen Reactions  . Amoxicillin Rash   Outpatient Medications Prior to Visit  Medication Sig Dispense Refill  . atenolol (TENORMIN) 25 MG tablet Take 0.5 tablets (12.5 mg total) by mouth daily.    . bifidobacterium infantis (ALIGN) capsule Take 1 capsule by mouth daily.    . Calcium-Vitamin D-Vitamin K (VIACTIV PO) Take 1 tablet by mouth daily. Reported on 01/04/2016    . Dexlansoprazole 30 MG capsule Take 60 mg by mouth daily.     . fludrocortisone (FLORINEF) 0.1 MG tablet Take 1 tablet by mouth daily 90 tablet 5  . FLUoxetine (PROZAC) 40 MG capsule TAKE 1 CAPSULE (40 MG TOTAL) BY MOUTH DAILY. 30 capsule 2  . FLUoxetine (PROZAC) 40 MG capsule TAKE 1 CAPSULE (40 MG TOTAL) BY MOUTH DAILY. 30 capsule 3  . fluticasone (FLONASE) 50 MCG/ACT nasal spray Place 2 sprays into both nostrils daily. 16 g 12  . ibuprofen (ADVIL,MOTRIN) 800 MG tablet Take 1 tablet (800 mg total) by  mouth every 8 (eight) hours as needed for mild pain or moderate pain. 15 tablet 0  . JUNEL 1.5/30 1.5-30 MG-MCG tablet   5  . Multiple Vitamin (MULTIVITAMIN WITH MINERALS) TABS tablet Take 1 tablet by mouth daily.    Marland Kitchen OLANZapine (ZYPREXA) 2.5 MG tablet TAKE 1/2 TABLET BY MOUTH AT BEDTIME. 30 tablet 1  . polyethylene glycol (MIRALAX / GLYCOLAX) packet Take 17 g by mouth daily.    . SUMAtriptan (IMITREX) 25 MG tablet Take 25 mg by mouth every 2 (two) hours  as needed for migraine. May repeat in 2 hours if headache persists or recurs.    . Topiramate ER 150 MG CS24 TAKE 1 CAPSULE BY MOUTH AT NIGHTTIME 30 each 5  . Wheat Dextrin (BENEFIBER DRINK MIX PO) Take by mouth.     No facility-administered medications prior to visit.      Patient Active Problem List   Diagnosis Date Noted  . Hematuria 03/22/2016  . Nonspecific abnormal results of endocrine function study 09/21/2015  . Adjustment disorder with mixed anxiety and depressed mood 07/13/2015  . Chronic bilateral lower abdominal pain 05/24/2015  . Anorexia nervosa 04/29/2015  . Postural orthostatic tachycardia syndrome 09/26/2014  . Migraine without aura 10/30/2012  . Episodic tension type headache 10/30/2012  . Orthostatic hypotension 10/30/2012     PHQ-SADS 05/30/2016 03/21/2016 10/18/2015  PHQ-15 12 11 12   GAD-7 7 5 4   PHQ-9 9 5 5   Suicidal Ideation No No No  Comment somewhat difficult  somewhat difficult  Somewhat difficult    PHQ-SADS 05/24/2015 04/29/2015 04/13/2015  PHQ-15 11 12 14   GAD-7 4 7 9   PHQ-9 5 9 8   Suicidal Ideation No No No  Comment Somewhat difficult Somewhat difficult Somewhat difficult   The following portions of the patient's history were reviewed and updated as appropriate: allergies, current medications, past medical history and problem list.  Physical Exam:  Vitals:   05/30/16 1406  BP: 101/67  Pulse: 78  Weight: 132 lb 9.6 oz (60.1 kg)  Height: 5' 5.35" (1.66 m)   BP 101/67   Pulse 78   Ht 5' 5.35" (1.66 m)   Wt 132 lb 9.6 oz (60.1 kg)   LMP 05/30/2016 (Exact Date)   BMI 21.83 kg/m  Body mass index: body mass index is 21.83 kg/m. Growth percentile SmartLinks can only be used for patients less than 67 years old.  Wt Readings from Last 3 Encounters:  05/30/16 132 lb 9.6 oz (60.1 kg)  05/24/16 133 lb 6.4 oz (60.5 kg)  04/12/16 137 lb (62.1 kg)    Physical Exam  HENT:  erythamatous oropharynx   Eyes: EOM are normal. Pupils are equal, round,  and reactive to light.  Neck: Neck supple. No thyromegaly present.  Cardiovascular: Normal rate, regular rhythm, normal heart sounds and intact distal pulses.   No murmur heard. Pulmonary/Chest: Effort normal.  Abdominal: Soft. She exhibits no distension and no mass. There is tenderness (LLQ consistent with stool burden ). There is no rebound and no guarding.  Musculoskeletal: She exhibits no edema.  Lymphadenopathy:    She has no cervical adenopathy.  Neurological: She is alert.  Skin: Skin is warm and dry. No rash noted.  Psychiatric: Her mood appears anxious.    Assessment/Plan: 1. Anorexia nervosa -down 5 lbs since September -increased anxiety r/t school pressures; acknowledges she is controlling anxiety through restriction -she was open to an increase in medication, however acknowledges the she stopped Zyprexa on her own.  -discussed restart  1.75 mg Zyprexa; continue with therapy - focus on coping skills and stress reduction  2. Adjustment disorder with mixed anxiety and depressed mood -PHQSADs negative today   3. Screening for genitourinary condition -reviewed below; trace blood with menses, otherwise WNL  - POCT urinalysis dipstick  4. Hematuria, unspecified type -will monitor; reviewed trace findings today; likely r/t menses -asymptomatic   Follow-up:  Return in about 2 weeks for with Christianne Dolinhristy Millican, FNP-C, medication follow-up, DE management.   Medical decision-making:  >25 minutes spent face to face with patient with more than 50% of appointment spent discussing diagnosis, management, follow-up, and reviewing the plan of care as noted above.

## 2016-05-30 NOTE — Patient Instructions (Addendum)
Take Zyprexa 1.75 (1/2 tab) mg at bedtime.  Keep scheduled appointments.  Please let me know how you are feeling - send My Chart message in a few days after restarting medicine.

## 2016-06-06 DIAGNOSIS — F419 Anxiety disorder, unspecified: Secondary | ICD-10-CM | POA: Diagnosis not present

## 2016-06-08 ENCOUNTER — Encounter: Payer: BLUE CROSS/BLUE SHIELD | Admitting: *Deleted

## 2016-06-08 DIAGNOSIS — F509 Eating disorder, unspecified: Secondary | ICD-10-CM | POA: Diagnosis not present

## 2016-06-08 DIAGNOSIS — F5000 Anorexia nervosa, unspecified: Secondary | ICD-10-CM

## 2016-06-08 DIAGNOSIS — Z713 Dietary counseling and surveillance: Secondary | ICD-10-CM | POA: Diagnosis not present

## 2016-06-08 NOTE — Patient Instructions (Addendum)
Check glucose Monday morning right before lunch at 11 One random day, check 2 hours after finishing breakfast Check one random day 2 hours after lunch Check if you feel symptomatic and/or actually pass out  Add cheese stick or nuts or spoonful of peanut butter to breakfast  Continue good work with water Check BP again at home once daily for a week  Call neuro about syncope   Over winter break, explore options for adult specialists. Ask about  POTS experience  Resume Miralax daily until stooling is normal Please please please please please prioritize eating!!!!!!!!!!!!!!!  For endoscopy, bring fun size skittles and Luna bar

## 2016-06-08 NOTE — Progress Notes (Signed)
Appointment start time: 1400  Appointment end time: 1500  Patient was seen on 06/08/16 for nutrition counseling pertaining to disordered eating.  She is accompanied by her mother  Primary care provider: Dr. Rana SnareLowe Therapist: Noni SaupeHeather Kitchen  Any other medical team members: Dr. Karlene EinsteinPerry/Adolescent medicine NPs Parents: Bonnita LevanJoy Thomas  Assessment Tomorrow is her birthday.  She is going out to dinner.   She has 3 days of class left.  clinicals are finished .  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  3 hours after breakfast (CIB and Luna) and some water.  She new she wasn't feeling well, but thought she could make it.  She passed out while seated during huddle on the floor. She started shaking.  No one checked her BP or glucose or anything.  She ate a snack and drank 2 cups OJ and felt better  she passed out again on 11/11.  Went to get coffee and had a upset stomach.  Didn't eat much dinner and was cramping for awhile.  Stayed in the bathroom for awhile and came out of the bathroom, laid her head down and then passed out.  Her stomach has been bothering her ever since  Has not notified cards/neuro about these episodes.  Reflux is worse since her barium swallow.  Throwing up in her mouth. Hiccups.  Belches regularly Constipated again.  No BM since 11/11.  Has endoscopy scheduled soon.  Has not transition to other adult subspecialists (cards/neuro).  Does have adult PCP appointment scheduled with [name]  Has lost ~5 lb due to anxiety induced restriction.  She does not believe she has lost that much weight.      Growth Metrics: Ideal BMI for age: 4621.6 BMI current: 21.7 % Ideal:  100% Previous growth data: weight/age  70-90th%; height/age at 75%; BMI/age 51-85% Goal BMI range based on growth chart data: 22-25 Goal weight range based on growth chart data: 130-145 lb No further weight restoration needed.  However, she is still restricting/appetite suppressed with Topomax.  When she eats intuitively, she will  most likely gain a little more weight   Mental health diagnosis: AN, B/P subtype   Dietary assessment: mom says she's always been picky; currently following gluten-free diet   B: Luna and CIB Sdecaff peppermint mocha L: BBQ chicken 2 pieces and salad S: Luna bar D: Apples, blueberries, pecans; salad, spicy rice with cheese; potato salad and cake S: chesnut praline latte 2 water bottles, 1 cup water  B: Luna bar (woke up late) L: chipotle with rice, beans, chicken, veggies, sour cream (ate the whole thing).  2 waters [place] bar D: pieology pizza, little more than half of the pie.  2 more cups water S: PB graham crackers   Physical activity: none   Estimated energy intake: 1600-1700  Estimated energy needs: 2200 kcal for weight maintenance 275 g CHO 110 g pro 73 g fat  Nutrition Diagnosis: NI-1.4 Inadequate energy intake As related to restricting and purging.  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  My hope is she can rebound somewhat over winter break.  I am very concerned about her syncope episodes .  Mom states these symptoms are not like her POTS symptoms.  I agree.  Sounds more like hypoglycemia  Gave Accu Chek Guide glucometer with instructions to check glucose Lot # [id number].   Exp 12/18  Goals: Check glucose Monday morning right before lunch at 11 One random day, check 2 hours  after finishing breakfast Check one random day 2 hours after lunch Check if you feel symptomatic and/or actually pass out  Add cheese stick or nuts or spoonful of peanut butter to breakfast  Continue good work with water Check BP again at home once daily for a week  Call neuro about syncope   Over winter break, explore options for adult specialists. Ask about  POTS experience  Resume Miralax daily until stooling is normal Please please please please please prioritize eating!!!!!!!!!!!!!!!  For endoscopy, bring fun size  skittles and Luna bar   Monitoring and Evaluation: Patient will follow up in 2 weeks.

## 2016-06-13 DIAGNOSIS — R1033 Periumbilical pain: Secondary | ICD-10-CM | POA: Diagnosis not present

## 2016-06-13 DIAGNOSIS — R1310 Dysphagia, unspecified: Secondary | ICD-10-CM | POA: Diagnosis not present

## 2016-06-21 ENCOUNTER — Encounter: Payer: BLUE CROSS/BLUE SHIELD | Attending: Pediatrics | Admitting: *Deleted

## 2016-06-21 ENCOUNTER — Ambulatory Visit: Payer: BLUE CROSS/BLUE SHIELD | Admitting: *Deleted

## 2016-06-21 DIAGNOSIS — F509 Eating disorder, unspecified: Secondary | ICD-10-CM | POA: Diagnosis not present

## 2016-06-21 DIAGNOSIS — Z713 Dietary counseling and surveillance: Secondary | ICD-10-CM | POA: Diagnosis not present

## 2016-06-21 NOTE — Patient Instructions (Addendum)
Over break, try to check BP and glucose a couple times Please remember to drink your water (aka [employer]).  Bring bottle with you everywhere Have snacks in room so you won't forget them   Great job with self care on yesterday!

## 2016-06-21 NOTE — Progress Notes (Signed)
Appointment start time: 1000  Appointment end time: 1045  Patient was seen on 06/21/16 for nutrition counseling pertaining to disordered eating.  She is accompanied by her mother  Primary care provider: Dr. Rana SnareLowe Therapist: Noni SaupeHeather Kitchen  Any other medical team members: Dr. Karlene EinsteinPerry/Adolescent medicine NPs Parents: Bonnita LevanJoy Thomas  Assessment Her birthday was fun. She went out to eat and that was fine.   Today is the last day of classes.  Finals are next week.  She is hanging in there.  Her last final is Wednesday. Thanksgiving was stressful with grandmom getting hospitalized., but otherwise eating was ok.  Feels that she ate normally on thanksgiving as there wasn't a lot of things she could eat.  She ate dessert twice, but otherwise felt ok.   nausea and reflux still.  Endoscopy was normal.  Slow motility and reflux, but nothing else.   No fainting or dizziness. Did not check BP t all.  She did check glucose once and it was normal.    Friend most likely has eating disorder.  It's hard to let her go as they have been friends for a long time and they are in nursing school together.  This friend is not a good influence.     She is no longer consistent with her water because she isn't going to school.  She is no longer consistent with her snacks as she gets busy studying.  She has not checked her glucose regularly nor her BP at all.  She did not add her protein to breakfast     Growth Metrics: Ideal BMI for age: 4021.6 BMI current: 21.7 % Ideal:  100% Previous growth data: weight/age  68-90th%; height/age at 75%; BMI/age 38-85% Goal BMI range based on growth chart data: 22-25 Goal weight range based on growth chart data: 130-145 lb No further weight restoration needed.  However, she is still restricting/appetite suppressed with Topomax.  When she eats intuitively, she will most likely gain a little more weight   Mental health diagnosis: AN, B/P subtype   Dietary assessment: mom says she's  always been picky; currently following gluten-free diet  B: blueberry muffin CIB S: cheddar popcorn, water L: grilled club sandwich from [employer] D: french toast and chocolate milk S: coffee (not really0  B: luna bar and cib L: 8 grilled nuggets, yogurt parfait S: trail mix D: tomato soup, grilled cheese S: Luna bar  B: luna bar and cib L: cheese enchilada and rice S: PB graham crackers D black beans burger with cheese and sweet potatoes tots S: none Beverages: water, inadequate   Physical activity: none   Estimated energy intake: 1600-1700  Estimated energy needs: 2200 kcal for weight maintenance 275 g CHO 110 g pro 73 g fat  Nutrition Diagnosis: NI-1.4 Inadequate energy intake As related to restricting and purging.  As evidenced by dietary recall  Intervention/Goals:   Goals: One random day, check glucose 2 hours after finishing breakfast Check one random day 2 hours after lunch Check if you feel symptomatic and/or actually pass out  Add cheese stick or nuts or spoonful of peanut butter to breakfast  Resume consistency with water.  Keep bottle with you in your room while your study Keep snacks in your room while you study Check BP again at home once daily for a week  Call neuro about syncope   Over winter break, explore options for adult specialists. Ask about  POTS experience  Resume Miralax daily until stooling is normal Please please please  please please prioritize eating!!!!!!!!!!!!!!!   Monitoring and Evaluation: Patient will follow up in 2 weeks.

## 2016-06-22 ENCOUNTER — Ambulatory Visit (INDEPENDENT_AMBULATORY_CARE_PROVIDER_SITE_OTHER): Payer: BLUE CROSS/BLUE SHIELD | Admitting: Family

## 2016-06-22 ENCOUNTER — Encounter: Payer: Self-pay | Admitting: Family

## 2016-06-22 VITALS — BP 92/60 | HR 66 | Ht 65.35 in | Wt 134.0 lb

## 2016-06-22 DIAGNOSIS — Z1389 Encounter for screening for other disorder: Secondary | ICD-10-CM | POA: Diagnosis not present

## 2016-06-22 DIAGNOSIS — F4323 Adjustment disorder with mixed anxiety and depressed mood: Secondary | ICD-10-CM | POA: Diagnosis not present

## 2016-06-22 DIAGNOSIS — F5000 Anorexia nervosa, unspecified: Secondary | ICD-10-CM

## 2016-06-22 LAB — POCT URINALYSIS DIPSTICK
Bilirubin, UA: NEGATIVE
Glucose, UA: NEGATIVE
Ketones, UA: NEGATIVE
NITRITE UA: NEGATIVE
PH UA: 7
PROTEIN UA: NEGATIVE
RBC UA: NEGATIVE
SPEC GRAV UA: 1.01
UROBILINOGEN UA: NEGATIVE

## 2016-06-22 NOTE — Progress Notes (Signed)
THIS RECORD MAY CONTAIN CONFIDENTIAL INFORMATION THAT SHOULD NOT BE RELEASED WITHOUT REVIEW OF THE SERVICE PROVIDER.  Adolescent Medicine Consultation Follow-Up Visit Jessica [name]  is a 20 y.o. female referred by [name], MD here today for follow-up regarding AN.    Last seen in [facility] Medicine Clinic on 05/30/16 for AN.  Plan at last visit included restart zyprexa 1.75 mg; focus on coping/stress reduction; trace blood in UA.  - Pertinent Labs? No - Growth Chart Viewed? no   History was provided by the patient.  PCP Confirmed?  yes  My Chart Activated?   yes    Chief Complaint  Patient presents with  . Follow-up  . Eating Disorder    HPI:    -Taking Zyprexa 1.75 mg; OK with dose; not having more sleepiness with zyprexa.  -Still napping daily.  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  -11/9 - clinical - in huddle; needed a drink/snack, decided to go sit down outside the huddle; head down in lap; started shaking and sweating, passed out again; came to and just sat around for an hour. It had been about 3 hours since last meal. Was better after OJ.   11/11 - passed out from stomach pain. Had gone to [employer] coffee w boyfriend; they both had upset stomachs. At his house, had upset stomach and felt she needed to go and didn't -   Endoscopy was normal; slow motility and reflux.   -have an appt with Tinnie Gens at [facility] - 12/12 for primary care establish.   Review of Systems  Constitutional: Negative for malaise/fatigue.  HENT: Negative for sore throat.   Eyes: Negative for double vision and pain.  Respiratory: Negative for shortness of breath.   Cardiovascular: Negative for chest pain and palpitations.  Gastrointestinal: Positive for abdominal pain, constipation, diarrhea and nausea. Negative for vomiting.  Genitourinary: Negative for dysuria and hematuria.  Musculoskeletal: Negative for joint pain and myalgias.  Skin: Negative for rash.  Neurological: Negative for  dizziness, tremors and headaches.  Endo/Heme/Allergies: Does not bruise/bleed easily.  Psychiatric/Behavioral: The patient is nervous/anxious.     Patient's last menstrual period was 05/30/2016 (exact date). Allergies  Allergen Reactions  . Amoxicillin Rash   Outpatient Medications Prior to Visit  Medication Sig Dispense Refill  . atenolol (TENORMIN) 25 MG tablet Take 0.5 tablets (12.5 mg total) by mouth daily.    . bifidobacterium infantis (ALIGN) capsule Take 1 capsule by mouth daily.    . Calcium-Vitamin D-Vitamin K (VIACTIV PO) Take 1 tablet by mouth daily. Reported on 01/04/2016    . Dexlansoprazole 30 MG capsule Take 60 mg by mouth daily.     . fludrocortisone (FLORINEF) 0.1 MG tablet Take 1 tablet by mouth daily 90 tablet 5  . FLUoxetine (PROZAC) 40 MG capsule TAKE 1 CAPSULE (40 MG TOTAL) BY MOUTH DAILY. 30 capsule 2  . FLUoxetine (PROZAC) 40 MG capsule TAKE 1 CAPSULE (40 MG TOTAL) BY MOUTH DAILY. 30 capsule 3  . fluticasone (FLONASE) 50 MCG/ACT nasal spray Place 2 sprays into both nostrils daily. 16 g 12  . ibuprofen (ADVIL,MOTRIN) 800 MG tablet Take 1 tablet (800 mg total) by mouth every 8 (eight) hours as needed for mild pain or moderate pain. 15 tablet 0  . JUNEL 1.5/30 1.5-30 MG-MCG tablet   5  . Multiple Vitamin (MULTIVITAMIN WITH MINERALS) TABS tablet Take 1 tablet by mouth daily.    Marland Kitchen OLANZapine (ZYPREXA) 2.5 MG tablet TAKE 1/2 TABLET BY MOUTH AT BEDTIME. 30 tablet 1  .  SUMAtriptan (IMITREX) 25 MG tablet Take 25 mg by mouth every 2 (two) hours as needed for migraine. May repeat in 2 hours if headache persists or recurs.    . Topiramate ER 150 MG CS24 TAKE 1 CAPSULE BY MOUTH AT NIGHTTIME 30 each 5  . Wheat Dextrin (BENEFIBER DRINK MIX PO) Take by mouth.    . polyethylene glycol (MIRALAX / GLYCOLAX) packet Take 17 g by mouth daily.     No facility-administered medications prior to visit.      Patient Active Problem List   Diagnosis Date Noted  . Hematuria 03/22/2016  .  Nonspecific abnormal results of endocrine function study 09/21/2015  . Adjustment disorder with mixed anxiety and depressed mood 07/13/2015  . Chronic bilateral lower abdominal pain 05/24/2015  . Anorexia nervosa 04/29/2015  . Postural orthostatic tachycardia syndrome 09/26/2014  . Migraine without aura 10/30/2012  . Episodic tension type headache 10/30/2012  . Orthostatic hypotension 10/30/2012    The following portions of the patient's history were reviewed and updated as appropriate: allergies, current medications, past medical history and problem list.  Physical Exam:  Vitals:   06/22/16 1405  BP: 92/60  Pulse: 66  Weight: 134 lb (60.8 kg)  Height: 5' 5.35" (1.66 m)   Wt Readings from Last 3 Encounters:  06/22/16 134 lb (60.8 kg)  05/30/16 132 lb 9.6 oz (60.1 kg)  05/24/16 133 lb 6.4 oz (60.5 kg)     BP 92/60   Pulse 66   Ht 5' 5.35" (1.66 m)   Wt 134 lb (60.8 kg)   LMP 05/30/2016 (Exact Date)   BMI 22.06 kg/m  Body mass index: body mass index is 22.06 kg/m. Growth percentile SmartLinks can only be used for patients less than 21 years old.  Urine dipstick shows negative for all components, excluding moderate leuks.  Physical Exam  Constitutional: She is oriented to person, place, and time. She appears well-developed and well-nourished. No distress.  Eyes: EOM are normal. Pupils are equal, round, and reactive to light. No scleral icterus.  Neck: Normal range of motion. Neck supple. No thyromegaly present.  Cardiovascular: Normal rate, regular rhythm, normal heart sounds and intact distal pulses.   No murmur heard. Pulmonary/Chest: Effort normal and breath sounds normal.  Abdominal: Soft. She exhibits no distension.  Musculoskeletal: Normal range of motion. She exhibits no edema or tenderness.  Lymphadenopathy:    She has no cervical adenopathy.  Neurological: She is alert and oriented to person, place, and time. No cranial nerve deficit.  Skin: Skin is warm and  dry. No rash noted.  Psychiatric: She has a normal mood and affect.  Nursing note and vitals reviewed.    Assessment/Plan: 1. Anorexia nervosa -improvement since restart of zyprexa  -encouraged to continue with medications as prescribed -continue therapy, RD -consider increase protein intake with next clinical rotation; I feel it was likely low blood sugar incident she describes; she did not have glucometer at the time; encouraged to keep with her.   2. Adjustment disorder with mixed anxiety and depressed mood -continue to monitor; PHQSADS at next OV   3. Screening for genitourinary condition WNL, reassurance given that no blood in sample today  - POCT urinalysis dipstick   Follow-up:  Return in about 4 weeks (around 07/20/2016) for with Christianne Dolinhristy Millican, FNP-C, [place] management.   Medical decision-making:  >15 minutes spent face to face with patient with more than 50% of appointment spent discussing diagnosis, management, follow-up, and reviewing the plan of care as  noted above.

## 2016-06-27 DIAGNOSIS — F419 Anxiety disorder, unspecified: Secondary | ICD-10-CM | POA: Diagnosis not present

## 2016-06-30 ENCOUNTER — Ambulatory Visit (INDEPENDENT_AMBULATORY_CARE_PROVIDER_SITE_OTHER): Payer: BLUE CROSS/BLUE SHIELD | Admitting: Physician Assistant

## 2016-06-30 ENCOUNTER — Ambulatory Visit (INDEPENDENT_AMBULATORY_CARE_PROVIDER_SITE_OTHER): Payer: BLUE CROSS/BLUE SHIELD

## 2016-06-30 VITALS — BP 110/76 | HR 82 | Temp 98.8°F | Resp 16 | Ht 65.0 in

## 2016-06-30 DIAGNOSIS — R059 Cough, unspecified: Secondary | ICD-10-CM

## 2016-06-30 DIAGNOSIS — R05 Cough: Secondary | ICD-10-CM

## 2016-06-30 MED ORDER — BENZONATATE 100 MG PO CAPS: 100.0000 mg | ORAL_CAPSULE | Freq: Three times a day (TID) | ORAL | 0 refills | Status: DC | PRN

## 2016-06-30 MED ORDER — AZITHROMYCIN 250 MG PO TABS: ORAL_TABLET | ORAL | 0 refills | Status: AC

## 2016-06-30 NOTE — Progress Notes (Signed)
Patient ID: Jessica [name], female     DOB: [birth year], 20 y.o.    MRN: [id number]  PCP: Jessica Clay, MD  Chief Complaint  Patient presents with  . Cough    X 1 month     Subjective:   This patient is new to me and presents for evaluation of cough x 1 month. Has an appointment to establish with me as PCP next week, but decided she couldn't wait.  Dry, hacking cough for the past month. Seems worse this past week. For the past several days, feels like it's moved more into the chest and has a sinus headache. Feels achy and chilled x 3 days. Lots of nausea. No vomiting. Gags with coughing, especially in the morning. "My stomach is kind of a mess all the time, but that's just me." No urinary changes.  Mother developed asthma in college. She has known GERD.  Received flu vaccine 04/2016.  Final exams this past week. Difficult semester in nursing school.   Review of Systems As above.   Prior to Admission medications   Medication Sig Start Date End Date Taking? Authorizing Provider  atenolol (TENORMIN) 25 MG tablet Take 0.5 tablets (12.5 mg total) by mouth daily. 07/27/15  Yes Jessica Nine, MD  bifidobacterium infantis (ALIGN) capsule Take 1 capsule by mouth daily.   Yes Historical Provider, MD  Calcium-Vitamin D-Vitamin K (VIACTIV PO) Take 1 tablet by mouth daily. Reported on 01/04/2016   Yes Historical Provider, MD  Dexlansoprazole 30 MG capsule Take 60 mg by mouth daily.    Yes Historical Provider, MD  fludrocortisone (FLORINEF) 0.1 MG tablet Take 1 tablet by mouth daily 04/13/15  Yes Jessica Shark, MD  FLUoxetine (PROZAC) 40 MG capsule TAKE 1 CAPSULE (40 MG TOTAL) BY MOUTH DAILY. 04/14/16  Yes Jessica Skill, FNP  fluticasone (FLONASE) 50 MCG/ACT nasal spray Place 2 sprays into both nostrils daily. 11/16/15  Yes Jessica Dolin, NP  ibuprofen (ADVIL,MOTRIN) 800 MG tablet Take 1 tablet (800 mg total) by mouth every 8 (eight) hours as needed for mild pain or moderate  pain. 12/10/15  Yes Jessica Dredge, PA-C  JUNEL 1.5/30 1.5-30 MG-MCG tablet  12/30/15  Yes Historical Provider, MD  Multiple Vitamin (MULTIVITAMIN WITH MINERALS) TABS tablet Take 1 tablet by mouth daily.   Yes Historical Provider, MD  OLANZapine (ZYPREXA) 2.5 MG tablet TAKE 1/2 TABLET BY MOUTH AT BEDTIME. 05/29/16  Yes Jessica Dolin, NP  SUMAtriptan (IMITREX) 25 MG tablet Take 25 mg by mouth every 2 (two) hours as needed for migraine. May repeat in 2 hours if headache persists or recurs.   Yes Historical Provider, MD  Topiramate ER 150 MG CS24 TAKE 1 CAPSULE BY MOUTH AT NIGHTTIME 01/07/16  Yes Jessica Rising, NP     Allergies  Allergen Reactions  . Amoxicillin Rash     Patient Active Problem List   Diagnosis Date Noted  . Hematuria 03/22/2016  . Nonspecific abnormal results of endocrine function study 09/21/2015  . Adjustment disorder with mixed anxiety and depressed mood 07/13/2015  . Chronic bilateral lower abdominal pain 05/24/2015  . Anorexia nervosa 04/29/2015  . Postural orthostatic tachycardia syndrome 09/26/2014  . Migraine without aura 10/30/2012  . Episodic tension type headache 10/30/2012  . Orthostatic hypotension 10/30/2012     Family History  Problem Relation Age of Onset  . Migraines Mother     Started 4th or 5th grade  . Seizures Mother     Febrile Seizures as a child  .  Seizures Father     Febrile Seizures as a child  . Hypertension Father   . Kidney Stones Father   . Migraines Maternal Aunt   . Migraines Maternal Uncle   . Diabetes Maternal Grandmother   . Stroke Maternal Grandfather     mini-stroke  . Cancer Maternal Grandfather     Bladder cancer, Died at 7568  . Diabetes Maternal Grandfather   . Mental retardation Other     Maternal Second Cousin  . Other Other     Maternal Great Uncle had some sort of Retinal Vessel Occlusion  . Breast cancer Maternal Aunt   . Cancer Maternal Aunt     Peritoneal CA  . Eating disorder Maternal Aunt   . Depression       Father's side of the family     Social History   Social History  . Marital status: Single    Spouse name: N/A  . Number of children: N/A  . Years of education: N/A   Occupational History  . student     [employer]   Social History Main Topics  . Smoking status: Never Smoker  . Smokeless tobacco: Never Used  . Alcohol use No  . Drug use: No  . Sexual activity: No   Other Topics Concern  . 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    She is majoring in Nursing.    She lives with her parents and siblings.    She enjoys school, shopping,watching TV, drinking decaf coffee and helping with children at church.          Objective:  Physical Exam  Constitutional: She is oriented to person, place, and time. She appears well-developed and well-nourished. She is active and cooperative. No distress.  BP 110/76 (BP Location: Right Arm, Patient Position: Sitting, Cuff Size: Normal)   Pulse 82   Temp 98.8 F (37.1 C) (Oral)   Resp 16   Ht 5\' 5"  (1.651 m)   LMP 06/28/2016 (Exact Date)   SpO2 100%   HENT:  Head: Normocephalic and atraumatic.  Right Ear: Hearing, tympanic membrane, external ear and ear canal normal.  Left Ear: Hearing, tympanic membrane, external ear and ear canal normal.  Nose: Nose normal.  Mouth/Throat: Uvula is midline, oropharynx is clear and moist and mucous membranes are normal.  Eyes: Conjunctivae are normal. No scleral icterus.  Neck: Normal range of motion, full passive range of motion without pain and phonation normal. Neck supple. No thyromegaly present.  Cardiovascular: Normal rate, regular rhythm, normal heart sounds and intact distal pulses.   Pulses:      Radial pulses are 2+ on the right side, and 2+ on the left side.  Pulmonary/Chest: Effort normal and breath sounds normal.  Abdominal: Normal appearance and bowel sounds are normal. There is no hepatosplenomegaly. There is  no tenderness.  Lymphadenopathy:       Head (right side): No tonsillar, no preauricular, no posterior auricular and no occipital adenopathy present.       Head (left side): No tonsillar, no preauricular, no posterior auricular and no occipital adenopathy present.    She has no cervical adenopathy.       Right: No supraclavicular adenopathy present.       Left: No supraclavicular adenopathy present.  Neurological: She is alert and oriented to person, place, and time. No sensory deficit.  Skin: Skin is warm, dry and intact.  No rash noted. No cyanosis or erythema. Nails show no clubbing.  Psychiatric: She has a normal mood and affect. Her speech is normal and behavior is normal.         Dg Chest 2 View  Result Date: 06/30/2016 CLINICAL DATA:  cough x 1 month, worse x 1 week EXAM: CHEST  2 VIEW COMPARISON:  06/19/2011 FINDINGS: The heart size and mediastinal contours are within normal limits. Both lungs are clear. The visualized skeletal structures are unremarkable. IMPRESSION: No active cardiopulmonary disease. Electronically Signed   By: Norva PavlovElizabeth  Brown M.D.   On: 06/30/2016 13:45       Assessment & Plan:  1. Cough Uncertain etiology. Empiric treatment for possible infectious cause. If no improvement, consider reactive airway syndrome vs LPR. - DG Chest 2 View; Future - benzonatate (TESSALON) 100 MG capsule; Take 1-2 capsules (100-200 mg total) by mouth 3 (three) times daily as needed for cough.  Dispense: 40 capsule; Refill: 0 - azithromycin (ZITHROMAX) 250 MG tablet; Take 2 tabs PO x 1 dose, then 1 tab PO QD x 4 days  Dispense: 6 tablet; Refill: 0   Fernande Brashelle S. Matvey Llanas, PA-C Physician Assistant-Certified [facility]

## 2016-06-30 NOTE — Patient Instructions (Addendum)
Let's see if the antibiotic makes a difference-you may have a subacute bronchitis causing your symptoms.  Other things to consider as cause: reflux, asthma/reactive airways  I think that you ALSO have a viral upper respiratory infection (possibly the flu!), exacerbating the symptoms. Please get plenty of rest and drink plenty of fluids.    IF you received an x-ray today, you will receive an invoice from [facility]. Please contact [facility] at [phone] with questions or concerns regarding your invoice.   IF you received labwork today, you will receive an invoice from [facility]. Please contact [facility] at [phone] with questions or concerns regarding your invoice.   Our billing staff will not be able to assist you with questions regarding bills from these companies.  You will be contacted with the lab results as soon as they are available. The fastest way to get your results is to activate your My Chart account. Instructions are located on the last page of this paperwork. If you have not heard from us regarding the results in 2 weeks, please contact this office.

## 2016-07-03 ENCOUNTER — Telehealth (INDEPENDENT_AMBULATORY_CARE_PROVIDER_SITE_OTHER): Payer: Self-pay

## 2016-07-03 NOTE — Telephone Encounter (Signed)
Patient lvm stating that she has been without her Qudexy 150 mg for the past two days, and has only been taking the Qudexy 100 mg. Her pharmacy no longer carries the medication and she is unable to find a pharmacy that has it. CB# [phone]  I called Irmalee to clarify. She said that when she ran out of Qudexy 150 mg caps, she took Qudexy 100 mg caps. She had the 100 mg caps left over from last year. She said that she and her mother have called multiple pharmacies and none of them are carrying the Qudexy. She wants to know if we have samples until this can be straightened out. CB# [phone]

## 2016-07-03 NOTE — Telephone Encounter (Signed)
I called Linsy. She said [facility] they couldn't get Qudexy XR 150mg . [facility] but was told the same thing. I gave her samples of Qudexy XR 100mg  and 50mg  to equal 150mg  that she was taking and told her that I would talk to the rep for the medication. TG

## 2016-07-04 ENCOUNTER — Encounter: Payer: Self-pay | Admitting: Physician Assistant

## 2016-07-04 ENCOUNTER — Ambulatory Visit (INDEPENDENT_AMBULATORY_CARE_PROVIDER_SITE_OTHER): Payer: BLUE CROSS/BLUE SHIELD | Admitting: Physician Assistant

## 2016-07-04 VITALS — BP 102/66 | HR 82 | Temp 98.6°F | Resp 18 | Ht 65.0 in | Wt 133.0 lb

## 2016-07-04 DIAGNOSIS — F419 Anxiety disorder, unspecified: Secondary | ICD-10-CM | POA: Diagnosis not present

## 2016-07-04 DIAGNOSIS — R05 Cough: Secondary | ICD-10-CM

## 2016-07-04 DIAGNOSIS — R059 Cough, unspecified: Secondary | ICD-10-CM

## 2016-07-04 DIAGNOSIS — F5000 Anorexia nervosa, unspecified: Secondary | ICD-10-CM | POA: Diagnosis not present

## 2016-07-04 NOTE — Progress Notes (Signed)
Patient ID: Jessica KitchenCiara J [name], female    DOB: 10-25-1995, 20 y.o.   MRN: [id number]  PCP: Porfirio Oarhelle Marit Goodwill, PA-C  Chief Complaint  Patient presents with  . Follow-up    COUGH    Subjective:   Presents for evaluation of cough. This visit was originally scheduled to establish care, on the recommendation of her nutritionist and pediatrician, as she is approaching the end of care at the pediatrics office.  She has anorexia nervosa, with history of calorie restriction, purging and over exercising. She also has migraine with aura, adjustment disorder with mixed anxiety/depression.  I saw her on 12/08 with a cough x 1 month. She felt like she couldn't wait until today for evaluation. CXR was normal. She was treated for atypicals with azithromycin, and advised to follow-up today. LPR and reactive airway syndrome were also considered. Cough is improving, but not gone. Feels like the treatment provided on 12/08 is helping.  Stress is less with the end of exams and [place] children staying with them have left.  Eating is a lot better than it used to be. Had some setbacks with the stress of last semester (she's in nursing school). Doesn't have a great relationship with her body: doesn't really like her body, any of it. If she could only change one thing about it, she'd want to be a little skinnier. [name], RD "makes sure I eat." No intentional physical activity. Has been prescribed Zyprexa, and stopped it. Restarted it 06/22/2016. She takes 1/2 tablet to avoid somnolence. This is managed by Christianne Dolinhristy Millican, NP, and she plans to see her through her 20st year, and then will transition to me. Next visit with Ms. Iona BeardMillican is around 07/20/2016.  Review of Systems  Constitutional: Negative.   HENT: Negative for sore throat.   Eyes: Negative for visual disturbance.  Respiratory: Positive for cough (improving). Negative for chest tightness, shortness of breath and wheezing.   Cardiovascular:  Negative for chest pain and palpitations.  Gastrointestinal: Negative for abdominal pain, diarrhea, nausea and vomiting.  Genitourinary: Negative for dysuria, frequency, hematuria and urgency.  Musculoskeletal: Negative for arthralgias and myalgias.  Skin: Negative for rash.  Neurological: Negative for dizziness, weakness and headaches.  Psychiatric/Behavioral: Negative for decreased concentration. The patient is not nervous/anxious.        Patient Active Problem List   Diagnosis Date Noted  . Hematuria 03/22/2016  . Nonspecific abnormal results of endocrine function study 09/21/2015  . Adjustment disorder with mixed anxiety and depressed mood 07/13/2015  . Chronic bilateral lower abdominal pain 05/24/2015  . Anorexia nervosa 04/29/2015  . Postural orthostatic tachycardia syndrome 09/26/2014  . Migraine without aura 10/30/2012  . Episodic tension type headache 10/30/2012  . Orthostatic hypotension 10/30/2012     Prior to Admission medications   Medication Sig Start Date End Date Taking? Authorizing Provider  atenolol (TENORMIN) 25 MG tablet Take 0.5 tablets (12.5 mg total) by mouth daily. 07/27/15  Yes Tyrone Nineyan B Grunz, MD  azithromycin (ZITHROMAX) 250 MG tablet Take 2 tabs PO x 1 dose, then 1 tab PO QD x 4 days 06/30/16 07/05/16 Yes Alijah Akram, PA-C  benzonatate (TESSALON) 100 MG capsule Take 1-2 capsules (100-200 mg total) by mouth 3 (three) times daily as needed for cough. 06/30/16  Yes Shayden Gingrich, PA-C  bifidobacterium infantis (ALIGN) capsule Take 1 capsule by mouth daily.   Yes Historical Provider, MD  Calcium-Vitamin D-Vitamin K (VIACTIV PO) Take 1 tablet by mouth daily. Reported on 01/04/2016   Yes Historical  Provider, MD  Dexlansoprazole 30 MG capsule Take 60 mg by mouth daily.    Yes Historical Provider, MD  fludrocortisone (FLORINEF) 0.1 MG tablet Take 1 tablet by mouth daily 04/13/15  Yes Owens SharkMartha F Perry, MD  FLUoxetine (PROZAC) 40 MG capsule TAKE 1 CAPSULE (40 MG TOTAL)  BY MOUTH DAILY. 04/14/16  Yes Verneda Skillaroline T Hacker, FNP  fluticasone (FLONASE) 50 MCG/ACT nasal spray Place 2 sprays into both nostrils daily. 11/16/15  Yes Christianne Dolinhristy Millican, NP  ibuprofen (ADVIL,MOTRIN) 800 MG tablet Take 1 tablet (800 mg total) by mouth every 8 (eight) hours as needed for mild pain or moderate pain. 12/10/15  Yes Trixie Dredgemily West, PA-C  JUNEL 1.5/30 1.5-30 MG-MCG tablet  12/30/15  Yes Historical Provider, MD  Multiple Vitamin (MULTIVITAMIN WITH MINERALS) TABS tablet Take 1 tablet by mouth daily.   Yes Historical Provider, MD  OLANZapine (ZYPREXA) 2.5 MG tablet TAKE 1/2 TABLET BY MOUTH AT BEDTIME. 05/29/16  Yes Christianne Dolinhristy Millican, NP  SUMAtriptan (IMITREX) 25 MG tablet Take 25 mg by mouth every 2 (two) hours as needed for migraine. May repeat in 2 hours if headache persists or recurs.   Yes Historical Provider, MD  Topiramate ER 150 MG CS24 TAKE 1 CAPSULE BY MOUTH AT NIGHTTIME 01/07/16  Yes Elveria Risingina Goodpasture, NP     Allergies  Allergen Reactions  . Amoxicillin Rash       Objective:  Physical Exam  Constitutional: She is oriented to person, place, and time. She appears well-developed and well-nourished. She is active and cooperative. No distress.  BP 102/66 (BP Location: Right Arm, Patient Position: Sitting, Cuff Size: Small)   Pulse 82   Temp 98.6 F (37 C) (Oral)   Resp 18   Ht 5\' 5"  (1.651 m)   Wt 133 lb (60.3 kg)   LMP 06/28/2016   SpO2 99%   BMI 22.13 kg/m   HENT:  Head: Normocephalic and atraumatic.  Right Ear: Hearing normal.  Left Ear: Hearing normal.  Eyes: Conjunctivae are normal. No scleral icterus.  Neck: Normal range of motion. Neck supple. No thyromegaly present.  Cardiovascular: Normal rate, regular rhythm and normal heart sounds.   Pulses:      Radial pulses are 2+ on the right side, and 2+ on the left side.  Pulmonary/Chest: Effort normal and breath sounds normal.  Lymphadenopathy:       Head (right side): No tonsillar, no preauricular, no posterior auricular  and no occipital adenopathy present.       Head (left side): No tonsillar, no preauricular, no posterior auricular and no occipital adenopathy present.    She has no cervical adenopathy.       Right: No supraclavicular adenopathy present.       Left: No supraclavicular adenopathy present.  Neurological: She is alert and oriented to person, place, and time. No sensory deficit.  Skin: Skin is warm, dry and intact. No rash noted. No cyanosis or erythema. Nails show no clubbing.  Psychiatric: She has a normal mood and affect. Her speech is normal and behavior is normal.           Assessment & Plan:   1. Cough Improving. Continue supportive care.  2. Anorexia nervosa Continue management, and follow-up here as needed or upon 21nd birthday.   Fernande Brashelle S. Marisabel Macpherson, PA-C Physician Assistant-Certified [facility]

## 2016-07-04 NOTE — Patient Instructions (Addendum)
Keep working with Charity fundraiserlaura and [employer] CorporationChristy. I'll look forward to seeing you when you are 21, or sooner if you need me!    IF you received an x-ray today, you will receive an invoice from [facility]. Please contact [facility] at [phone] with questions or concerns regarding your invoice.   IF you received labwork today, you will receive an invoice from [facility]. Please contact [facility] at [phone] with questions or concerns regarding your invoice.   Our billing staff will not be able to assist you with questions regarding bills from these companies.  You will be contacted with the lab results as soon as they are available. The fastest way to get your results is to activate your My Chart account. Instructions are located on the last page of this paperwork. If you have not heard from us regarding the results in 2 weeks, please contact this office.

## 2016-07-06 ENCOUNTER — Encounter: Payer: BLUE CROSS/BLUE SHIELD | Attending: Pediatrics | Admitting: *Deleted

## 2016-07-06 DIAGNOSIS — Z713 Dietary counseling and surveillance: Secondary | ICD-10-CM | POA: Insufficient documentation

## 2016-07-06 DIAGNOSIS — F509 Eating disorder, unspecified: Secondary | ICD-10-CM | POA: Insufficient documentation

## 2016-07-06 DIAGNOSIS — F5000 Anorexia nervosa, unspecified: Secondary | ICD-10-CM

## 2016-07-06 NOTE — Patient Instructions (Addendum)
Please call to find adult neuro and adult cards Please check blood sugar and blood pressure twice this winter break Please keep carnations and/or Luna bars in your car/purse just in case you're out and about and can't find GF options Please focus on water!!  Rest, eat, drink, have fun!!

## 2016-07-06 NOTE — Progress Notes (Signed)
Appointment start time: 1400 Appointment end time: 1445  Patient was seen on 07/06/16 for nutrition counseling pertaining to disordered eating.  She is accompanied by her mother  Primary care provider: Porfirio Oarhelle [name] Therapist: Noni SaupeHeather Kitchen  Any other medical team members: Dr. Karlene EinsteinPerry/Adolescent medicine NPs Parents: Bonnita LevanJoy Thomas  Assessment Finished finals and is on break from school for a month.  Is going to [place].  Saw chelle and really likes her.  Will continue working with christy for eating disorder treatment until age 20 and will see chelle for primary care.  Has not been consistent with water.         Growth Metrics: Ideal BMI for age: 3321.6 BMI current: 22 % Ideal:  100% Previous growth data: weight/age  58-90th%; height/age at 75%; BMI/age 68-85% Goal BMI range based on growth chart data: 22-25 Goal weight range based on growth chart data: 130-145 lb No further weight restoration needed.  However, she is still restricting/appetite suppressed with Topomax.  When she eats intuitively, she will most likely gain a little more weight   Mental health diagnosis: AN, B/P subtype   Dietary assessment: mom says she's always been picky; currently following gluten-free diet  B: honey nut cheerios L: chicken vegetable soup S: gluten free poptart D: ham, tomato soup S: luna bar  B: honey nut cheerios L: grilled nuggets, fruit cup with sprite S: CIB D: grilled chicken breast, salad, veggie chips S: salted caramel hot chocolate  B: cheerios L: amy frozen meal S: luna D: burrito bowl with chips S: gf poptart  B: large muffin and cib L: beans and rice S: luna D: chips and dip S: 4 chicken nuggets   Physical activity: none   Estimated energy intake: 1600-1700  Estimated energy needs: 2200 kcal for weight maintenance 275 g CHO 110 g pro 73 g fat  Nutrition Diagnosis: NI-1.4 Inadequate energy intake As related to restricting and purging.  As evidenced by  dietary recall  Intervention/Goals: take this time to relax and refocus  Goals: Keep luna bars/cib with you in case there aren't GF options One random day, check glucose 2 hours after finishing breakfast Check one random day 2 hours after lunch Check if you feel symptomatic and/or actually pass out  Resume consistency with water.  Keep bottle with you  Check BP again at home once daily for a week   Over winter break, explore options for adult specialists. Ask about  [facility] experience  Please please please please please prioritize eating!!!!!!!!!!!!!!!   Monitoring and Evaluation: Patient will follow up in 2 weeks.

## 2016-07-11 DIAGNOSIS — F419 Anxiety disorder, unspecified: Secondary | ICD-10-CM | POA: Diagnosis not present

## 2016-07-13 ENCOUNTER — Encounter: Payer: Self-pay | Admitting: Physician Assistant

## 2016-07-13 MED ORDER — OSELTAMIVIR PHOSPHATE 75 MG PO CAPS: 75.0000 mg | ORAL_CAPSULE | Freq: Every day | ORAL | 0 refills | Status: DC

## 2016-07-20 ENCOUNTER — Encounter: Payer: Self-pay | Admitting: Family

## 2016-07-20 ENCOUNTER — Encounter: Payer: BLUE CROSS/BLUE SHIELD | Admitting: *Deleted

## 2016-07-20 ENCOUNTER — Ambulatory Visit (INDEPENDENT_AMBULATORY_CARE_PROVIDER_SITE_OTHER): Payer: BLUE CROSS/BLUE SHIELD | Admitting: Family

## 2016-07-20 VITALS — BP 102/67 | HR 72 | Ht 65.75 in | Wt 133.8 lb

## 2016-07-20 DIAGNOSIS — F4323 Adjustment disorder with mixed anxiety and depressed mood: Secondary | ICD-10-CM

## 2016-07-20 DIAGNOSIS — R161 Splenomegaly, not elsewhere classified: Secondary | ICD-10-CM | POA: Diagnosis not present

## 2016-07-20 DIAGNOSIS — F5000 Anorexia nervosa, unspecified: Secondary | ICD-10-CM | POA: Diagnosis not present

## 2016-07-20 DIAGNOSIS — Z1389 Encounter for screening for other disorder: Secondary | ICD-10-CM | POA: Diagnosis not present

## 2016-07-20 DIAGNOSIS — Z713 Dietary counseling and surveillance: Secondary | ICD-10-CM | POA: Diagnosis not present

## 2016-07-20 DIAGNOSIS — F509 Eating disorder, unspecified: Secondary | ICD-10-CM | POA: Diagnosis not present

## 2016-07-20 LAB — CBC WITH DIFFERENTIAL/PLATELET
BASOS ABS: 110 {cells}/uL (ref 0–200)
Basophils Relative: 2 %
EOS ABS: 110 {cells}/uL (ref 15–500)
Eosinophils Relative: 2 %
HEMATOCRIT: 42.9 % (ref 35.0–45.0)
Hemoglobin: 14 g/dL (ref 11.7–15.5)
LYMPHS PCT: 38 %
Lymphs Abs: 2090 cells/uL (ref 850–3900)
MCH: 30.2 pg (ref 27.0–33.0)
MCHC: 32.6 g/dL (ref 32.0–36.0)
MCV: 92.7 fL (ref 80.0–100.0)
MONOS PCT: 8 %
MPV: 11 fL (ref 7.5–12.5)
Monocytes Absolute: 440 cells/uL (ref 200–950)
NEUTROS PCT: 50 %
Neutro Abs: 2750 cells/uL (ref 1500–7800)
PLATELETS: 318 10*3/uL (ref 140–400)
RBC: 4.63 MIL/uL (ref 3.80–5.10)
RDW: 12.7 % (ref 11.0–15.0)
WBC: 5.5 10*3/uL (ref 3.8–10.8)

## 2016-07-20 LAB — POCT URINALYSIS DIPSTICK
BILIRUBIN UA: NEGATIVE
GLUCOSE UA: NEGATIVE
Ketones, UA: NEGATIVE
Leukocytes, UA: NEGATIVE
NITRITE UA: NEGATIVE
Protein, UA: NEGATIVE
RBC UA: NEGATIVE
Spec Grav, UA: <=1.005
Urobilinogen, UA: NEGATIVE
pH, UA: 8

## 2016-07-20 NOTE — Progress Notes (Signed)
THIS RECORD MAY CONTAIN CONFIDENTIAL INFORMATION THAT SHOULD NOT BE RELEASED WITHOUT REVIEW OF THE SERVICE PROVIDER.  Adolescent Medicine Consultation Follow-Up Visit [name]  is a 20 y.o. female referred by [name], [name], MD here today for follow-up regarding AN.   Last seen in [facility] on 06/22/16 for same.  Plan at last visit included continue Zyprexa, continue RD/Therapy.   - Pertinent Labs? No - Growth Chart Viewed? no   History was provided by the patient.  PCP Confirmed?  Yes, Theora Gianottihelle Jeffrey, PA   My Chart Activated?   yes   Chief Complaint: DE follow-up     HPI:    Took Tamiflu since mom had flu and family was given the option to take.  She feels overall well; had two episodes of upset stomach in the last two days.  No fainting/syncopal episodes since last OV. She is taking Zyprexa as prescribed.  Sleeping more on break, which has changed her eating habits somewhat.    Review of Systems  Constitutional: Negative for malaise/fatigue.  Eyes: Negative for double vision.  Respiratory: Negative for shortness of breath.   Cardiovascular: Negative for chest pain and palpitations.  Gastrointestinal: Negative for abdominal pain, constipation, diarrhea, nausea and vomiting.  Genitourinary: Negative for dysuria.  Musculoskeletal: Negative for joint pain and myalgias.  Skin: Negative for rash.  Neurological: Negative for dizziness and headaches.  Endo/Heme/Allergies: Does not bruise/bleed easily.   Patient's last menstrual period was 06/28/2016. Allergies  Allergen Reactions  . Amoxicillin Rash   Outpatient Medications Prior to Visit  Medication Sig Dispense Refill  . atenolol (TENORMIN) 25 MG tablet Take 0.5 tablets (12.5 mg total) by mouth daily.    . benzonatate (TESSALON) 100 MG capsule Take 1-2 capsules (100-200 mg total) by mouth 3 (three) times daily as needed for cough. 40 capsule 0  . bifidobacterium infantis (ALIGN) capsule Take 1  capsule by mouth daily.    . Calcium-Vitamin D-Vitamin K (VIACTIV PO) Take 1 tablet by mouth daily. Reported on 01/04/2016    . Dexlansoprazole 30 MG capsule Take 60 mg by mouth daily.     . fludrocortisone (FLORINEF) 0.1 MG tablet Take 1 tablet by mouth daily 90 tablet 5  . FLUoxetine (PROZAC) 40 MG capsule TAKE 1 CAPSULE (40 MG TOTAL) BY MOUTH DAILY. 30 capsule 3  . fluticasone (FLONASE) 50 MCG/ACT nasal spray Place 2 sprays into both nostrils daily. 16 g 12  . ibuprofen (ADVIL,MOTRIN) 800 MG tablet Take 1 tablet (800 mg total) by mouth every 8 (eight) hours as needed for mild pain or moderate pain. 15 tablet 0  . JUNEL 1.5/30 1.5-30 MG-MCG tablet   5  . Multiple Vitamin (MULTIVITAMIN WITH MINERALS) TABS tablet Take 1 tablet by mouth daily.    Marland Kitchen. OLANZapine (ZYPREXA) 2.5 MG tablet TAKE 1/2 TABLET BY MOUTH AT BEDTIME. 30 tablet 1  . oseltamivir (TAMIFLU) 75 MG capsule Take 1 capsule (75 mg total) by mouth daily. 10 capsule 0  . SUMAtriptan (IMITREX) 25 MG tablet Take 25 mg by mouth every 2 (two) hours as needed for migraine. May repeat in 2 hours if headache persists or recurs.    . Topiramate ER 150 MG CS24 TAKE 1 CAPSULE BY MOUTH AT NIGHTTIME 30 each 5   No facility-administered medications prior to visit.      Patient Active Problem List   Diagnosis Date Noted  . Hematuria 03/22/2016  . Nonspecific abnormal results of endocrine function study 09/21/2015  . Adjustment disorder with  mixed anxiety and depressed mood 07/13/2015  . Chronic bilateral lower abdominal pain 05/24/2015  . Anorexia nervosa 04/29/2015  . Postural orthostatic tachycardia syndrome 09/26/2014  . Migraine without aura 10/30/2012  . Episodic tension type headache 10/30/2012  . Orthostatic hypotension 10/30/2012   The following portions of the patient's history were reviewed and updated as appropriate: allergies, current medications and past medical history.  Physical Exam:  Vitals:   07/20/16 1413  BP: 102/67   Pulse: 72  Weight: 133 lb 12.8 oz (60.7 kg)  Height: 5' 5.75" (1.67 m)   BP 102/67   Pulse 72   Ht 5' 5.75" (1.67 m)   Wt 133 lb 12.8 oz (60.7 kg)   LMP 06/28/2016   BMI 21.76 kg/m  Body mass index: body mass index is 21.76 kg/m. Growth percentile SmartLinks can only be used for patients less than 21 years old.  Wt Readings from Last 3 Encounters:  07/20/16 133 lb 12.8 oz (60.7 kg)  07/04/16 133 lb (60.3 kg)  06/22/16 134 lb (60.8 kg)    Physical Exam  Constitutional: She appears well-developed. No distress.  HENT:  Head: Normocephalic and atraumatic.  Neck: Normal range of motion. Neck supple.  Cardiovascular: Normal rate and regular rhythm.   No murmur heard. Pulmonary/Chest: Effort normal and breath sounds normal.  Abdominal: Soft. There is splenomegaly. There is tenderness (LUQ).  Musculoskeletal: She exhibits no edema.  Lymphadenopathy:    She has no cervical adenopathy.  Neurological: She is alert.  Skin: Skin is warm and dry. No rash noted.  Vitals reviewed.   Assessment/Plan: 1. Anorexia nervosa -her weight is up slightly today with reported changes in eating habits r/t sleep/not feeling well.  -continue zyprexa  -continue work with tx team   2. Adjustment disorder with mixed anxiety and depressed mood -continue prozac 40 mg daily.   3. Splenomegaly -will check labs today for hematologic, inflammatory, and infectious processes - report to PCP new symptoms; will report lab findings - CBC with Differential/Platelet - Comprehensive metabolic panel  4. Screening for genitourinary condition wnl - POCT urinalysis dipstick   Follow-up:  Return in about 4 weeks (around 08/17/2016) for with Christianne Dolinhristy Millican, FNP-C, DE management.

## 2016-07-20 NOTE — Patient Instructions (Addendum)
Try Dr. Graciela HusbandsKlein, cardiologist Address: [address], [place], [place] [address] Phone: [phone]    Gatorade haas more electrolytes.  Try that please   Live like you're seeing me every day! Drink, eat, sleep enough!!!!!!! Have your candies in your pockets and keep your gatorade on you.  EAT!!!!!!!

## 2016-07-20 NOTE — Progress Notes (Signed)
Appointment start time: 1400 Appointment end time: 1430  Patient was seen on 07/20/16 for nutrition counseling pertaining to disordered eating.  She is accompanied by her mother  Primary care provider: Porfirio Oarhelle [name] Therapist: Noni SaupeHeather Kitchen  Any other medical team members: Dr. Karlene EinsteinPerry/Adolescent medicine NPs Parents: Bonnita LevanJoy Thomas  Assessment Is getting additional testing for spleen discomfort.  No new abdominal pain, but is always hurting.  At exam with christy, there was increased pain.  Has had more upset stomach lately, but doesn't think she is eating that differently.  She has not eating excessively.    Was constipated, but past couple days has diarrhea.   Is sleeping well, but that affects her eating.  No dizziness .  BP normal today.  Has not checked at home Glucose normal at home one day she checked  Inadequate water previously.  Only drinks enough when she is preparing to come for nutrition visits.  Is not consistent    Growth Metrics: Ideal BMI for age: 821.6 BMI current: 22 % Ideal:  100% Previous growth data: weight/age  2-90th%; height/age at 75%; BMI/age 24-85% Goal BMI range based on growth chart data: 22-25 Goal weight range based on growth chart data: 130-145 lb No further weight restoration needed.  However, she is still restricting/appetite suppressed with Topomax.  When she eats intuitively, she will most likely gain a little more weight   Mental health diagnosis: AN, B/P subtype   Dietary assessment: mom says she's always been picky; currently following gluten-free diet  B: oatmeal and CIB L: chicken chili with cheese, 12 oz water S: 2 GF chocolate chip cookies D: large portion broiled shrimp and baked potato, slaw.  20 oz water S: yogurt and 12 oz water  B: cocoa pebbles L: 3 chicken tenders and propel S: apple with nutella.  20 oz water D: chicken chili and tortilla chips. 20 oz water S: 2 cookies    Physical activity: none   Estimated energy  intake: 1600-1700  Estimated energy needs: 2200 kcal for weight maintenance 275 g CHO 110 g pro 73 g fat  Nutrition Diagnosis: NI-1.4 Inadequate energy intake As related to restricting and purging.  As evidenced by dietary recall  Intervention/Goals:  Please stay consistent with food, fluids, and especially electrolytes as you approach clinicals next semester. Please call to find adult cards/neuro   Monitoring and Evaluation: Patient will follow up in 4 weeks.

## 2016-07-21 ENCOUNTER — Telehealth: Payer: Self-pay

## 2016-07-21 LAB — COMPREHENSIVE METABOLIC PANEL
ALT: 27 U/L (ref 6–29)
AST: 17 U/L (ref 10–30)
Albumin: 4.4 g/dL (ref 3.6–5.1)
Alkaline Phosphatase: 46 U/L (ref 33–115)
BUN: 11 mg/dL (ref 7–25)
CALCIUM: 9.4 mg/dL (ref 8.6–10.2)
CHLORIDE: 111 mmol/L — AB (ref 98–110)
CO2: 21 mmol/L (ref 20–31)
Creat: 0.82 mg/dL (ref 0.50–1.10)
GLUCOSE: 113 mg/dL — AB (ref 65–99)
POTASSIUM: 4.5 mmol/L (ref 3.5–5.3)
Sodium: 141 mmol/L (ref 135–146)
Total Bilirubin: 0.3 mg/dL (ref 0.2–1.2)
Total Protein: 6.9 g/dL (ref 6.1–8.1)

## 2016-07-21 NOTE — Telephone Encounter (Signed)
Please call [facility] with lab results [phone].

## 2016-07-25 ENCOUNTER — Encounter: Payer: Self-pay | Admitting: Family

## 2016-07-25 DIAGNOSIS — F419 Anxiety disorder, unspecified: Secondary | ICD-10-CM | POA: Diagnosis not present

## 2016-07-25 NOTE — Telephone Encounter (Signed)
Labs released through MyChart.

## 2016-07-31 ENCOUNTER — Telehealth: Payer: Self-pay

## 2016-07-31 NOTE — Telephone Encounter (Signed)
Jessica [name] - Pt has a question about her spleen because it was enlarged at a previous doctor visit.  That doctor told her to follow up with her PCP if she continued having problems.  She has experienced some severe abdominal pain.  I told her if she had not heard back from us and if the pain continued to come in.  [phone]

## 2016-07-31 NOTE — Telephone Encounter (Signed)
Pt advised ov asap with chelle or er if n/v or fever

## 2016-08-01 ENCOUNTER — Encounter: Payer: Self-pay | Admitting: Physician Assistant

## 2016-08-01 ENCOUNTER — Ambulatory Visit (INDEPENDENT_AMBULATORY_CARE_PROVIDER_SITE_OTHER): Payer: BLUE CROSS/BLUE SHIELD | Admitting: Physician Assistant

## 2016-08-01 VITALS — BP 114/71 | HR 85 | Temp 98.5°F | Resp 18 | Ht 65.75 in | Wt 137.0 lb

## 2016-08-01 DIAGNOSIS — R1012 Left upper quadrant pain: Secondary | ICD-10-CM

## 2016-08-01 MED ORDER — HYDROCOD POLST-CPM POLST ER 10-8 MG/5ML PO SUER: 5.0000 mL | Freq: Two times a day (BID) | ORAL | 0 refills | Status: DC | PRN

## 2016-08-01 NOTE — Progress Notes (Signed)
Patient ID: Jessica KitchenCiara J [name], female    DOB: Jan 11, 1996, 21 y.o.   MRN: [id number]  PCP: Porfirio Oarhelle Mayline Dragon, PA-C  Chief Complaint  Patient presents with  . Abdominal Pain    LLQ x 2 weeks. Previous doctor said enlarged spleen no relief from pain.    Subjective:   Presents for evaluation of LEFT sided abdominal pain and enlarged spleen.  Saw Christianne Dolinhristy Millican, NP 12/28 and was advised that her spleen was enlarged. Some labs were normal. She was advised to follow-up with me.  Ms. Cristobal GoldmannMillican's note reviewed. CBC and CMET were normal.  2 weeks of a cold, since right after Christmas. Nasal congestion initially, then cough with post-nasal drainage, then 2 night ago she developed post-tussive emesis. Tessalon, Delsym, Mucinex isn't helping. Non-productive. No fever, chills. Diarrhea.  Continues to have pain in the LUQ and today has had some back pain. Rates 6/10 at it's worst, 2/10 at it's least. Never completely resolves. Eating aggravates the pain and causes nausea. Not alleviated by anything, but being still and resting seems to help some.  Recalls a history of mononucleosis.    Review of Systems As above.    Patient Active Problem List   Diagnosis Date Noted  . Hematuria 03/22/2016  . Nonspecific abnormal results of endocrine function study 09/21/2015  . Adjustment disorder with mixed anxiety and depressed mood 07/13/2015  . Chronic bilateral lower abdominal pain 05/24/2015  . Anorexia nervosa 04/29/2015  . Postural orthostatic tachycardia syndrome 09/26/2014  . Migraine without aura 10/30/2012  . Episodic tension type headache 10/30/2012  . Orthostatic hypotension 10/30/2012     Prior to Admission medications   Medication Sig Start Date End Date Taking? Authorizing Provider  atenolol (TENORMIN) 25 MG tablet Take 0.5 tablets (12.5 mg total) by mouth daily. 07/27/15  Yes Tyrone Nineyan B Grunz, MD  benzonatate (TESSALON) 100 MG capsule Take 1-2 capsules (100-200 mg total) by  mouth 3 (three) times daily as needed for cough. 06/30/16  Yes Kalista Laguardia, PA-C  bifidobacterium infantis (ALIGN) capsule Take 1 capsule by mouth daily.   Yes Historical Provider, MD  Calcium-Vitamin D-Vitamin K (VIACTIV PO) Take 1 tablet by mouth daily. Reported on 01/04/2016   Yes Historical Provider, MD  Dexlansoprazole 30 MG capsule Take 60 mg by mouth daily.    Yes Historical Provider, MD  fludrocortisone (FLORINEF) 0.1 MG tablet Take 1 tablet by mouth daily 04/13/15  Yes Owens SharkMartha F Perry, MD  FLUoxetine (PROZAC) 40 MG capsule TAKE 1 CAPSULE (40 MG TOTAL) BY MOUTH DAILY. 04/14/16  Yes Verneda Skillaroline T Hacker, FNP  fluticasone (FLONASE) 50 MCG/ACT nasal spray Place 2 sprays into both nostrils daily. 11/16/15  Yes Christianne Dolinhristy Millican, NP  ibuprofen (ADVIL,MOTRIN) 800 MG tablet Take 1 tablet (800 mg total) by mouth every 8 (eight) hours as needed for mild pain or moderate pain. 12/10/15  Yes Trixie Dredgemily West, PA-C  JUNEL 1.5/30 1.5-30 MG-MCG tablet  12/30/15  Yes Historical Provider, MD  Multiple Vitamin (MULTIVITAMIN WITH MINERALS) TABS tablet Take 1 tablet by mouth daily.   Yes Historical Provider, MD  OLANZapine (ZYPREXA) 2.5 MG tablet TAKE 1/2 TABLET BY MOUTH AT BEDTIME. 05/29/16  Yes Christianne Dolinhristy Millican, NP  SUMAtriptan (IMITREX) 25 MG tablet Take 25 mg by mouth every 2 (two) hours as needed for migraine. May repeat in 2 hours if headache persists or recurs.   Yes Historical Provider, MD  Topiramate ER 150 MG CS24 TAKE 1 CAPSULE BY MOUTH AT NIGHTTIME 01/07/16  Yes Elveria Risingina Goodpasture,  NP     Allergies  Allergen Reactions  . Amoxicillin Rash       Objective:  Physical Exam  Constitutional: She is oriented to person, place, and time. She appears well-developed and well-nourished. She is active and cooperative. No distress.  BP 114/71 (BP Location: Right Arm, Patient Position: Sitting, Cuff Size: Normal)   Pulse 85   Temp 98.5 F (36.9 C) (Oral)   Resp 18   Ht 5' 5.75" (1.67 m)   Wt 137 lb (62.1 kg)   LMP  07/25/2016 (Approximate)   SpO2 100%   BMI 22.28 kg/m   HENT:  Head: Normocephalic and atraumatic.  Right Ear: Hearing normal.  Left Ear: Hearing normal.  Eyes: Conjunctivae are normal. No scleral icterus.  Neck: Normal range of motion. Neck supple. No thyromegaly present.  Cardiovascular: Normal rate, regular rhythm and normal heart sounds.   Pulses:      Radial pulses are 2+ on the right side, and 2+ on the left side.  Pulmonary/Chest: Effort normal and breath sounds normal.  Abdominal: Soft. Normal appearance and bowel sounds are normal. There is splenomegaly (minimal). There is no hepatomegaly. There is tenderness in the left upper quadrant. There is no CVA tenderness.  Lymphadenopathy:       Head (right side): No tonsillar, no preauricular, no posterior auricular and no occipital adenopathy present.       Head (left side): No tonsillar, no preauricular, no posterior auricular and no occipital adenopathy present.    She has no cervical adenopathy.       Right: No supraclavicular adenopathy present.       Left: No supraclavicular adenopathy present.  Neurological: She is alert and oriented to person, place, and time. No sensory deficit.  Skin: Skin is warm, dry and intact. No rash noted. No cyanosis or erythema. Nails show no clubbing.  Psychiatric: She has a normal mood and affect. Her speech is normal and behavior is normal.           Assessment & Plan:   1. LUQ abdominal pain Uncertain etiology, but needs imaging. Cough is likely post-viral cough. Await EBV titers. Rest, hydrate. - Epstein-Barr virus VCA antibody panel - US Abdomen Complete; Future - chlorpheniramine-HYDROcodone (TUSSIONEX PENNKINETIC ER) 10-8 MG/5ML SUER; Take 5 mLs by mouth every 12 (twelve) hours as needed for cough.  Dispense: 100 mL; Refill: 0   Fernande Bras, PA-C Physician Assistant-Certified Primary [facility] at [facility]

## 2016-08-01 NOTE — Patient Instructions (Signed)
     IF you received an x-ray today, you will receive an invoice from [facility]. Please contact [facility] at [phone] with questions or concerns regarding your invoice.   IF you received labwork today, you will receive an invoice from [facility]. Please contact [facility] at [phone] with questions or concerns regarding your invoice.   Our billing staff will not be able to assist you with questions regarding bills from these companies.  You will be contacted with the lab results as soon as they are available. The fastest way to get your results is to activate your My Chart account. Instructions are located on the last page of this paperwork. If you have not heard from us regarding the results in 2 weeks, please contact this office.     

## 2016-08-02 DIAGNOSIS — F419 Anxiety disorder, unspecified: Secondary | ICD-10-CM | POA: Diagnosis not present

## 2016-08-03 LAB — EPSTEIN-BARR VIRUS VCA ANTIBODY PANEL
EBV EARLY ANTIGEN AB, IGG: 11.9 U/mL — AB (ref 0.0–8.9)
EBV VCA IgG: 44.3 U/mL — ABNORMAL HIGH (ref 0.0–17.9)

## 2016-08-09 ENCOUNTER — Other Ambulatory Visit: Payer: Self-pay

## 2016-08-11 DIAGNOSIS — F419 Anxiety disorder, unspecified: Secondary | ICD-10-CM | POA: Diagnosis not present

## 2016-08-16 ENCOUNTER — Ambulatory Visit (INDEPENDENT_AMBULATORY_CARE_PROVIDER_SITE_OTHER): Payer: BLUE CROSS/BLUE SHIELD | Admitting: Pediatrics

## 2016-08-16 ENCOUNTER — Encounter: Payer: Self-pay | Admitting: Pediatrics

## 2016-08-16 ENCOUNTER — Other Ambulatory Visit: Payer: Self-pay

## 2016-08-16 VITALS — BP 108/71 | HR 79 | Ht 65.35 in | Wt 132.4 lb

## 2016-08-16 DIAGNOSIS — R1012 Left upper quadrant pain: Secondary | ICD-10-CM

## 2016-08-16 DIAGNOSIS — F5000 Anorexia nervosa, unspecified: Secondary | ICD-10-CM

## 2016-08-16 DIAGNOSIS — F4323 Adjustment disorder with mixed anxiety and depressed mood: Secondary | ICD-10-CM

## 2016-08-16 DIAGNOSIS — Z1389 Encounter for screening for other disorder: Secondary | ICD-10-CM | POA: Diagnosis not present

## 2016-08-16 DIAGNOSIS — F419 Anxiety disorder, unspecified: Secondary | ICD-10-CM | POA: Diagnosis not present

## 2016-08-16 LAB — POCT URINALYSIS DIPSTICK
Bilirubin, UA: NEGATIVE
Glucose, UA: NEGATIVE
Ketones, UA: NEGATIVE
NITRITE UA: NEGATIVE
PH UA: 7
PROTEIN UA: NEGATIVE
RBC UA: NEGATIVE
Spec Grav, UA: 1.015
UROBILINOGEN UA: NEGATIVE

## 2016-08-16 MED ORDER — OLANZAPINE 2.5 MG PO TABS: 1.2500 mg | ORAL_TABLET | Freq: Every day | ORAL | 1 refills | Status: DC

## 2016-08-16 MED ORDER — FLUOXETINE HCL 40 MG PO CAPS
ORAL_CAPSULE | ORAL | 3 refills | Status: DC
Start: 2016-08-16 — End: 2016-12-04

## 2016-08-16 NOTE — Progress Notes (Signed)
THIS RECORD MAY CONTAIN CONFIDENTIAL INFORMATION THAT SHOULD NOT BE RELEASED WITHOUT REVIEW OF THE SERVICE PROVIDER.  Adolescent Medicine Consultation Follow-Up Visit Jessica [name]  is a 21 y.o. female referred by Harrison Mons, PA-C here today for follow-up regarding disordered eating.    Last seen in [facility] on 07/20/16 for DE.  Plan at last visit included continue current medications, f/u with PCP for abd pain and splenomegaly.  - Pertinent Labs? Yes Office Visit on 08/16/2016  Component Date Value  . Color, UA 08/16/2016 yellow   . Clarity, UA 08/16/2016 cloudy   . Glucose, UA 08/16/2016 neg   . Bilirubin, UA 08/16/2016 neg   . Ketones, UA 08/16/2016 neg   . Spec Grav, UA 08/16/2016 1.015   . Blood, UA 08/16/2016 neg   . pH, UA 08/16/2016 7.0   . Protein, UA 08/16/2016 neg   . Urobilinogen, UA 08/16/2016 negative   . Nitrite, UA 08/16/2016 neg   . Leukocytes, UA 08/16/2016 large (3+)*  Office Visit on 08/01/2016  Component Date Value  . EBV VCA IgM 08/01/2016 <36.0   . EBV Early Antigen Ab, IgG 08/01/2016 11.9*  . EBV VCA IgG 08/01/2016 44.3*  . EBV NA IgG 08/01/2016 <18.0   . Interpretation: 08/01/2016 Comment   Office Visit on 07/20/2016  Component Date Value  . Color, UA 07/20/2016 yellow   . Clarity, UA 07/20/2016 cloudy   . Glucose, UA 07/20/2016 neg   . Bilirubin, UA 07/20/2016 neg   . Ketones, UA 07/20/2016 neg   . Spec Grav, UA 07/20/2016 <=1.005   . Blood, UA 07/20/2016 neg   . pH, UA 07/20/2016 8.0   . Protein, UA 07/20/2016 neg   . Urobilinogen, UA 07/20/2016 negative   . Nitrite, UA 07/20/2016 neg   . Leukocytes, UA 07/20/2016 Negative   . WBC 07/20/2016 5.5   . RBC 07/20/2016 4.63   . Hemoglobin 07/20/2016 14.0   . HCT 07/20/2016 42.9   . MCV 07/20/2016 92.7   . [facility] 07/20/2016 30.2   . MCHC 07/20/2016 32.6   . RDW 07/20/2016 12.7   . Platelets 07/20/2016 318   . MPV 07/20/2016 11.0   . Neutro Abs 07/20/2016 2750   . Lymphs  Abs 07/20/2016 2090   . Monocytes Absolute 07/20/2016 440   . Eosinophils Absolute 07/20/2016 110   . Basophils Absolute 07/20/2016 110   . Neutrophils Relative % 07/20/2016 50   . Lymphocytes Relative 07/20/2016 38   . Monocytes Relative 07/20/2016 8   . Eosinophils Relative 07/20/2016 2   . Basophils Relative 07/20/2016 2   . Smear Review 07/20/2016 Criteria for review not met   . Sodium 07/20/2016 141   . Potassium 07/20/2016 4.5   . Chloride 07/20/2016 111*  . CO2 07/20/2016 21   . Glucose, Bld 07/20/2016 113*  . BUN 07/20/2016 11   . Creat 07/20/2016 0.82   . Total Bilirubin 07/20/2016 0.3   . Alkaline Phosphatase 07/20/2016 46   . AST 07/20/2016 17   . ALT 07/20/2016 27   . Total Protein 07/20/2016 6.9   . Albumin 07/20/2016 4.4   . Calcium 07/20/2016 9.4   Office Visit on 06/22/2016  Component Date Value  . Color, UA 06/22/2016 yellow   . Clarity, UA 06/22/2016 clear   . Glucose, UA 06/22/2016 neg   . Bilirubin, UA 06/22/2016 neg   . Ketones, UA 06/22/2016 neg   . Spec Grav, UA 06/22/2016 1.010   . Blood, UA 06/22/2016 neg   .  pH, UA 06/22/2016 7.0   . Protein, UA 06/22/2016 neg   . Urobilinogen, UA 06/22/2016 negative   . Nitrite, UA 06/22/2016 neg   . Leukocytes, UA 06/22/2016 moderate (2+)*  Office Visit on 05/30/2016  Component Date Value  . Color, UA 05/30/2016 clear   . Clarity, UA 05/30/2016 clear   . Glucose, UA 05/30/2016 neg   . Bilirubin, UA 05/30/2016 neg   . Ketones, UA 05/30/2016 neg   . Spec Grav, UA 05/30/2016 <=1.005   . Blood, UA 05/30/2016 trace   . pH, UA 05/30/2016 7.5   . Protein, UA 05/30/2016 neg   . Urobilinogen, UA 05/30/2016 negative   . Nitrite, UA 05/30/2016 neg   . Leukocytes, UA 05/30/2016 Negative     - Growth Chart Viewed? yes   History was provided by the patient and mother.  PCP Confirmed?  yes   Chief Complaint  Patient presents with  . Follow-up  . Eating Disorder    HPI:   U/s cancelled with snow.  Rescheduled for Friday. Continues with abd pain worse after eating. Nursing clinicals  Feels like she is doing better staying hydrated  Feels like eating has been- pretty good- not great. Doesn't always get snacks in. Misses night snack. Sometimes not hungry, sometimes things happen and gets tied up.  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  Was sleeping too much but school schedule is helping some. On days off sleeping till 05-1129 and taking some naps.   PHQ-SADS 08/16/2016  PHQ-15 11  GAD-7 5  PHQ-9 6  Suicidal Ideation No  Comment somewhat difficult      Review of Systems  Constitutional: Negative for malaise/fatigue.  Eyes: Negative for double vision.  Respiratory: Negative for shortness of breath.   Cardiovascular: Negative for chest pain and palpitations.  Gastrointestinal: Positive for abdominal pain. Negative for constipation, diarrhea and vomiting.  Genitourinary: Negative for dysuria.  Musculoskeletal: Negative for joint pain and myalgias.  Skin: Negative for rash.  Neurological: Negative for dizziness and headaches.  Endo/Heme/Allergies: Does not bruise/bleed easily.     Patient's last menstrual period was 07/25/2016 (approximate). Allergies  Allergen Reactions  . Amoxicillin Rash   Outpatient Medications Prior to Visit  Medication Sig Dispense Refill  . atenolol (TENORMIN) 25 MG tablet Take 0.5 tablets (12.5 mg total) by mouth daily.    . benzonatate (TESSALON) 100 MG capsule Take 1-2 capsules (100-200 mg total) by mouth 3 (three) times daily as needed for cough. 40 capsule 0  . bifidobacterium infantis (ALIGN) capsule Take 1 capsule by mouth daily.    . Calcium-Vitamin D-Vitamin K (VIACTIV PO) Take 1 tablet by mouth daily. Reported on 01/04/2016    . chlorpheniramine-HYDROcodone (TUSSIONEX PENNKINETIC ER) 10-8 MG/5ML SUER Take 5 mLs by mouth every 12 (twelve) hours as needed for cough. 100 mL 0   . Dexlansoprazole 30 MG capsule Take 60 mg by mouth daily.     . fludrocortisone (FLORINEF) 0.1 MG tablet Take 1 tablet by mouth daily 90 tablet 5  . FLUoxetine (PROZAC) 40 MG capsule TAKE 1 CAPSULE (40 MG TOTAL) BY MOUTH DAILY. 30 capsule 3  . fluticasone (FLONASE) 50 MCG/ACT nasal spray Place 2 sprays into both nostrils daily. 16 g 12  . ibuprofen (ADVIL,MOTRIN) 800 MG tablet Take 1 tablet (800 mg total) by mouth every 8 (eight) hours as needed for mild pain or moderate pain. 15 tablet 0  . JUNEL 1.5/30  1.5-30 MG-MCG tablet   5  . Multiple Vitamin (MULTIVITAMIN WITH MINERALS) TABS tablet Take 1 tablet by mouth daily.    Marland Kitchen OLANZapine (ZYPREXA) 2.5 MG tablet TAKE 1/2 TABLET BY MOUTH AT BEDTIME. 30 tablet 1  . SUMAtriptan (IMITREX) 25 MG tablet Take 25 mg by mouth every 2 (two) hours as needed for migraine. May repeat in 2 hours if headache persists or recurs.    . Topiramate ER 150 MG CS24 TAKE 1 CAPSULE BY MOUTH AT NIGHTTIME 30 each 5   No facility-administered medications prior to visit.      Patient Active Problem List   Diagnosis Date Noted  . Hematuria 03/22/2016  . Nonspecific abnormal results of endocrine function study 09/21/2015  . Adjustment disorder with mixed anxiety and depressed mood 07/13/2015  . Chronic bilateral lower abdominal pain 05/24/2015  . Anorexia nervosa 04/29/2015  . Postural orthostatic tachycardia syndrome 09/26/2014  . Migraine without aura 10/30/2012  . Episodic tension type headache 10/30/2012  . Orthostatic hypotension 10/30/2012    Confidentiality was discussed with the patient and if applicable, with caregiver as well.  Tobacco?  no Drugs/ETOH?  no Partner preference?  female Sexually Active?  no  Pregnancy Prevention:  birth control pills, reviewed condoms & plan B Trauma currently or in the pastt?  no Suicidal or Self-Harm thoughts?   no   The following portions of the patient's history were reviewed and updated as appropriate: allergies,  current medications, past family history, past medical history, past social history and problem list.  Physical Exam:  Vitals:   08/16/16 1401  BP: 108/71  Pulse: 79  Weight: 132 lb 6.4 oz (60.1 kg)  Height: 5' 5.35" (1.66 m)   BP 108/71 (BP Location: Right Arm, Patient Position: Sitting, Cuff Size: Normal)   Pulse 79   Ht 5' 5.35" (1.66 m)   Wt 132 lb 6.4 oz (60.1 kg)   LMP 07/25/2016 (Approximate)   BMI 21.79 kg/m  Body mass index: body mass index is 21.79 kg/m. Growth percentile SmartLinks can only be used for patients less than 105 years old.  Physical Exam  Constitutional: She appears well-developed. No distress.  HENT:  Mouth/Throat: Oropharynx is clear and moist.  Neck: No thyromegaly present.  Cardiovascular: Normal rate and regular rhythm.   No murmur heard. Pulmonary/Chest: Breath sounds normal.  Abdominal: Soft. She exhibits no mass. There is no splenomegaly or hepatomegaly. There is tenderness in the left upper quadrant. There is no rebound and no guarding.  5/10 pain with palpation to LUQ  Musculoskeletal: She exhibits no edema.  Lymphadenopathy:    She has no cervical adenopathy.  Neurological: She is alert.  Skin: Skin is warm. No rash noted.  Psychiatric: She has a normal mood and affect.  Nursing note and vitals reviewed.   Assessment/Plan: 1. Adjustment disorder with mixed anxiety and depressed mood Continues with therapist. Continues on prozac 40 mg and zyprexa 1.25 mg. Having some sleepiness but not new. Will monitor. PHQ-SADs improved from previous visit.   2. Anorexia nervosa Weight down slightly but consistent with past weights. Has had more difficult eating related to abdominal pain and sometimes forgetting snacks. Continues with treatment team.   3. Left upper quadrant pain F/u per PCP after U/S.   4. Screening for genitourinary condition Results for orders placed or performed in visit on 08/16/16  POCT urinalysis dipstick  Result Value Ref  Range   Color, UA yellow    Clarity, UA cloudy    Glucose,  UA neg    Bilirubin, UA neg    Ketones, UA neg    Spec Grav, UA 1.015    Blood, UA neg    pH, UA 7.0    Protein, UA neg    Urobilinogen, UA negative    Nitrite, UA neg    Leukocytes, UA large (3+) (A) Negative   Could improve hydration status- has always been a challenge for her.    Follow-up:  4 weeks   Medical decision-making:  >25 minutes spent face to face with patient with more than 50% of appointment spent discussing diagnosis, management, follow-up, and reviewing the plan of care as noted above.

## 2016-08-16 NOTE — Patient Instructions (Signed)
[facility]  Continue meal plan and with treatment team  Start protonix  Let us know if any changes or worsening fatigue  Consider some time outside or walks in the afternoons

## 2016-08-18 ENCOUNTER — Encounter: Payer: Self-pay | Admitting: Physician Assistant

## 2016-08-18 ENCOUNTER — Other Ambulatory Visit: Payer: Self-pay | Admitting: Physician Assistant

## 2016-08-18 ENCOUNTER — Ambulatory Visit
Admission: RE | Admit: 2016-08-18 | Discharge: 2016-08-18 | Disposition: A | Discharge status: Home / Self Care | Payer: BLUE CROSS/BLUE SHIELD | Source: Ambulatory Visit | Attending: Physician Assistant | Admitting: Physician Assistant

## 2016-08-18 DIAGNOSIS — R1012 Left upper quadrant pain: Secondary | ICD-10-CM

## 2016-08-23 ENCOUNTER — Encounter: Payer: BLUE CROSS/BLUE SHIELD | Attending: Pediatrics | Admitting: *Deleted

## 2016-08-23 DIAGNOSIS — Z713 Dietary counseling and surveillance: Secondary | ICD-10-CM | POA: Diagnosis not present

## 2016-08-23 DIAGNOSIS — F419 Anxiety disorder, unspecified: Secondary | ICD-10-CM | POA: Diagnosis not present

## 2016-08-23 DIAGNOSIS — F509 Eating disorder, unspecified: Secondary | ICD-10-CM | POA: Diagnosis not present

## 2016-08-23 DIAGNOSIS — F5000 Anorexia nervosa, unspecified: Secondary | ICD-10-CM

## 2016-08-23 NOTE — Progress Notes (Signed)
Appointment start time: 1400 Appointment end time: 1445  Patient was seen on 08/23/16 for nutrition counseling pertaining to disordered eating.  She is accompanied by her mother  Primary care provider: Porfirio Oarhelle [name] Therapist: Noni SaupeHeather Kitchen  Any other medical team members: Dr. Karlene EinsteinPerry/Adolescent medicine NPs Parents: Bonnita LevanJoy Thomas  Assessment Abdominal US was normal. Abdominal pain is tolerable.  Was switched to protonix and that seems to be doing ok.  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  She just started. She is working on her non-school days [place]  Realizes it needs to be 2019   Thinks she is eating fine and has been drinking a lot more.  She's been extra thirsty and honoring that thirst.  She is pleased.  EAT-26 score today insignificant!!  Doing well  Growth Metrics: Ideal BMI for age: 9221.6 BMI current: 22 % Ideal:  100% Previous growth data: weight/age  44-90th%; height/age at 75%; BMI/age 79-85% Goal BMI range based on growth chart data: 22-25 Goal weight range based on growth chart data: 130-145 lb No further weight restoration needed.  However, she is still restricting/appetite suppressed with Topomax.  When she eats intuitively, she will most likely gain a little more weight   Mental health diagnosis: AN, B/P subtype   Dietary assessment: mom says she's always been picky; currently following gluten-free diet  B: luna bar, CIB L: 8 grilled nuggets, apples with grapes, mini caramel rice cakes S: quest bar D: 1/2 frozen cheese pizza S: banana Beverages: propel, 32 oz water  B: luna bar, cib L: amy mexican casserole bowl with propel S: nilla wafers and PB D: GF chicken pie, corn S trail mix Adequate fluid  B: 2 muffins with adequate water L: black bean burger, guca , tortilla chips S: dry cocoa pebbles D: small burrito bowl with rice black beans, chicken, pico, sour cream, lettuce.  Chips and  queso S: missed  Physical activity: none.     Estimated energy intake: 1600-1700  Estimated energy needs: 2200 kcal for weight maintenance 275 g CHO 110 g pro 73 g fat  Nutrition Diagnosis: NI-1.4 Inadequate energy intake As related to restricting and purging.  As evidenced by dietary recall  Intervention/Goals:  [place].  Keep it up.  Please work on finding adult specialists.  Ok to do yoga twice/week or zumba once/week for now  Monitoring and Evaluation: Patient will follow up in 4 weeks.

## 2016-08-28 ENCOUNTER — Encounter: Payer: Self-pay | Admitting: Physician Assistant

## 2016-08-29 MED ORDER — JUNEL 1.5/30 1.5-30 MG-MCG PO TABS
1.0000 | ORAL_TABLET | Freq: Every day | ORAL | 3 refills | Status: DC
Start: 2016-08-29 — End: 2017-05-03

## 2016-08-30 DIAGNOSIS — F419 Anxiety disorder, unspecified: Secondary | ICD-10-CM | POA: Diagnosis not present

## 2016-09-01 ENCOUNTER — Other Ambulatory Visit: Payer: Self-pay | Admitting: Pediatrics

## 2016-09-04 DIAGNOSIS — R55 Syncope and collapse: Secondary | ICD-10-CM | POA: Diagnosis not present

## 2016-09-06 DIAGNOSIS — F419 Anxiety disorder, unspecified: Secondary | ICD-10-CM | POA: Diagnosis not present

## 2016-09-13 DIAGNOSIS — F419 Anxiety disorder, unspecified: Secondary | ICD-10-CM | POA: Diagnosis not present

## 2016-09-20 ENCOUNTER — Ambulatory Visit: Payer: BLUE CROSS/BLUE SHIELD | Admitting: Pediatrics

## 2016-09-20 ENCOUNTER — Encounter: Payer: BLUE CROSS/BLUE SHIELD | Attending: Pediatrics | Admitting: *Deleted

## 2016-09-20 DIAGNOSIS — Z713 Dietary counseling and surveillance: Secondary | ICD-10-CM | POA: Insufficient documentation

## 2016-09-20 DIAGNOSIS — F419 Anxiety disorder, unspecified: Secondary | ICD-10-CM | POA: Diagnosis not present

## 2016-09-20 DIAGNOSIS — F5000 Anorexia nervosa, unspecified: Secondary | ICD-10-CM

## 2016-09-20 DIAGNOSIS — F509 Eating disorder, unspecified: Secondary | ICD-10-CM | POA: Diagnosis not present

## 2016-09-20 NOTE — Progress Notes (Signed)
Appointment start time: 0800  Appointment end time: 0900  Patient was seen on 08/23/16 for nutrition counseling pertaining to disordered eating.  She is accompanied by her boyfriend  Primary care provider: Porfirio Oarhelle [name] Therapist: Noni SaupeHeather Kitchen  Any other medical team members: Dr. Karlene EinsteinPerry/Adolescent medicine NPs Parents: Bonnita LevanJoy Thomas  Assessment Job ended as care recipient passed away. Is weaning off BP medicine. Can tell a difference (more symptomatic), but really wants to wean all the way off One day was hypotensive and extremely tired.  Tired more often lately.  Not checking BP at home regularly Has been drinking more Propel since her home water went out. But was drinking less total fluids.   Is getting back to drinking more fluids. Some abdominal pain. Normal BM, more than normal.  Daily!!! Was walking dogs some and that was fine Some dizziness.   Reflux ok; controlled with protonix.  Concerned about potential side effect of dementia?? Not always getting snacks Checks weight on her MyChart profile.  Somewhat concerned about her stomach.    Growth Metrics: Ideal BMI for age: 3921.6 BMI current: 21 % Ideal:  100% Previous growth data: weight/age  74-90th%; height/age at 75%; BMI/age 81-85% Goal BMI range based on growth chart data: 22-25 Goal weight range based on growth chart data: 130-145 lb No further weight restoration needed.  However, she is still restricting/appetite suppressed with Topomax.  When she eats intuitively, she will most likely gain a little more weight   Mental health diagnosis: AN, B/P subtype   Dietary assessment: mom says she's always been picky; currently following gluten-free diet  B: waffles with nutella L: grilled nuggets (8) yogurt partaif with granola. psrite S: luna and yogurt covered raisins D: baked chicken, corn, rice wirth veggies mixed in, roll S: honey nut chex  B: 2 waffles L: tomato soup S: trial mix D: CFA grilled sandwich   Physical  activity: none.     Estimated energy intake: 1600-1700  Estimated energy needs: 2200 kcal for weight maintenance 275 g CHO 110 g pro 73 g fat  Nutrition Diagnosis: NI-1.4 Inadequate energy intake As related to restricting and purging.  As evidenced by dietary recall  Intervention/Goals:  Discussed overall progress through recovery.  Reiterated that food and adequate food has restored her health.  Discussed belly fat as a sign of health and normal reproductive health.  Challenged societal expectations for female beauty.  Discussed knowing weight and being ok with it.  Need to get adequate fluids and food for BP Please ensure adequate intake in the future.  Please check BP at home Ask GI specialist if concerned about protonix.  Have 2 afternoon snacks, if dinner is going to be late to ensure 2 snacks daily  Monitoring and Evaluation: Patient will follow up in 4 weeks.

## 2016-09-20 NOTE — Patient Instructions (Addendum)
Please check BP 3 days/week Have 2 afternoon snacks, if needed instead of 1 afternoon and 1 night Keep snacks by bed

## 2016-09-22 ENCOUNTER — Ambulatory Visit (INDEPENDENT_AMBULATORY_CARE_PROVIDER_SITE_OTHER): Payer: BLUE CROSS/BLUE SHIELD | Admitting: Family

## 2016-09-22 ENCOUNTER — Encounter: Payer: Self-pay | Admitting: Family

## 2016-09-22 VITALS — BP 99/60 | HR 74 | Ht 65.35 in | Wt 131.6 lb

## 2016-09-22 DIAGNOSIS — F4323 Adjustment disorder with mixed anxiety and depressed mood: Secondary | ICD-10-CM

## 2016-09-22 DIAGNOSIS — R000 Tachycardia, unspecified: Secondary | ICD-10-CM | POA: Diagnosis not present

## 2016-09-22 DIAGNOSIS — Z1389 Encounter for screening for other disorder: Secondary | ICD-10-CM

## 2016-09-22 DIAGNOSIS — I951 Orthostatic hypotension: Secondary | ICD-10-CM

## 2016-09-22 DIAGNOSIS — F5000 Anorexia nervosa, unspecified: Secondary | ICD-10-CM

## 2016-09-22 DIAGNOSIS — G90A Postural orthostatic tachycardia syndrome (POTS): Secondary | ICD-10-CM

## 2016-09-22 LAB — POCT URINALYSIS DIPSTICK
BILIRUBIN UA: NEGATIVE
Blood, UA: NEGATIVE
GLUCOSE UA: NEGATIVE
Ketones, UA: NEGATIVE
NITRITE UA: NEGATIVE
Protein, UA: NEGATIVE
Spec Grav, UA: <=1.005
UROBILINOGEN UA: NEGATIVE
pH, UA: 7

## 2016-09-22 NOTE — Progress Notes (Signed)
THIS RECORD MAY CONTAIN CONFIDENTIAL INFORMATION THAT SHOULD NOT BE RELEASED WITHOUT REVIEW OF THE SERVICE PROVIDER.  Adolescent Medicine Consultation Follow-Up Visit Jessica [name]  is a 21 y.o. female referred by [name], [name], PA-C here today for follow-up regarding AN.    [facility] on 08/16/16 for same.   Plan at last visit included continued zyprexa and prozac; continues with tx team.   - Pertinent Labs? Yes - Growth Chart Viewed? no   History was provided by the patient.  PCP Confirmed?  yes  My Chart Activated?   yes    Chief Complaint  Patient presents with  . Follow-up  . Eating Disorder    HPI:    Presents with mom. Reviewed BP medication wean; had hypotensive/fatigue one day. Just wants to clean up med lists with fewer meds.  Still being followed by GI for LUQ pain.  Still continues to have orthostasis in AM without syncope.  Overall feels well; traveling with BF to beach soon.   Review of Systems  Constitutional: Positive for malaise/fatigue.  HENT: Negative for sore throat.   Eyes: Negative for double vision and pain.  Respiratory: Negative for shortness of breath.   Cardiovascular: Negative for chest pain.  Skin: Negative for rash.  Neurological: Positive for dizziness (in the am when out of bed). Negative for tremors.  Psychiatric/Behavioral: The patient is not nervous/anxious.     PHQ-SADS 09/22/2016 08/16/2016 05/30/2016  PHQ-15 10 11 12   GAD-7 2 5 7   PHQ-9 8 6 9   Suicidal Ideation No No No  Comment somewhat difficult  somewhat difficult  somewhat difficult     No LMP recorded. LMP 09/20/16 Allergies  Allergen Reactions  . Amoxicillin Rash   Outpatient Medications Prior to Visit  Medication Sig Dispense Refill  . atenolol (TENORMIN) 25 MG tablet Take 0.5 tablets (12.5 mg total) by mouth daily.    . bifidobacterium infantis (ALIGN) capsule Take 1 capsule by mouth daily.    . Calcium-Vitamin D-Vitamin K (VIACTIV  PO) Take 1 tablet by mouth daily. Reported on 01/04/2016    . fludrocortisone (FLORINEF) 0.1 MG tablet Take 1 tablet by mouth daily 90 tablet 5  . FLUoxetine (PROZAC) 40 MG capsule TAKE 1 CAPSULE (40 MG TOTAL) BY MOUTH DAILY. 30 capsule 3  . FLUoxetine (PROZAC) 40 MG capsule TAKE 1 CAPSULE BY MOUTH DAILY. 30 capsule 4  . fluticasone (FLONASE) 50 MCG/ACT nasal spray Place 2 sprays into both nostrils daily. 16 g 12  . ibuprofen (ADVIL,MOTRIN) 800 MG tablet Take 1 tablet (800 mg total) by mouth every 8 (eight) hours as needed for mild pain or moderate pain. 15 tablet 0  . JUNEL 1.5/30 1.5-30 MG-MCG tablet Take 1 tablet by mouth daily. 3 Package 3  . Multiple Vitamin (MULTIVITAMIN WITH MINERALS) TABS tablet Take 1 tablet by mouth daily.    Jessica Kitchen. OLANZapine (ZYPREXA) 2.5 MG tablet Take 0.5 tablets (1.25 mg total) by mouth at bedtime. 30 tablet 1  . pantoprazole (PROTONIX) 40 MG tablet     . SUMAtriptan (IMITREX) 25 MG tablet Take 25 mg by mouth every 2 (two) hours as needed for migraine. May repeat in 2 hours if headache persists or recurs.    . Topiramate ER 150 MG CS24 TAKE 1 CAPSULE BY MOUTH AT NIGHTTIME 30 each 5   No facility-administered medications prior to visit.      Patient Active Problem List   Diagnosis Date Noted  . Hematuria 03/22/2016  . Nonspecific  abnormal results of endocrine function study 09/21/2015  . Adjustment disorder with mixed anxiety and depressed mood 07/13/2015  . Chronic bilateral lower abdominal pain 05/24/2015  . Anorexia nervosa 04/29/2015  . Postural orthostatic tachycardia syndrome 09/26/2014  . Migraine without aura 10/30/2012  . Episodic tension type headache 10/30/2012  . Orthostatic hypotension 10/30/2012   The following portions of the patient's history were reviewed and updated as appropriate: allergies, current medications and past medical history.  Physical Exam:  Vitals:   09/22/16 0911  BP: 99/60  Pulse: 74  Weight: 131 lb 9.6 oz (59.7 kg)   Height: 5' 5.35" (1.66 m)   BP 99/60 (BP Location: Right Arm, Patient Position: Sitting, Cuff Size: Normal)   Pulse 74   Ht 5' 5.35" (1.66 m)   Wt 131 lb 9.6 oz (59.7 kg)   BMI 21.66 kg/m  Body mass index: body mass index is 21.66 kg/m. Growth percentile SmartLinks can only be used for patients less than 73 years old.  Wt Readings from Last 3 Encounters:  09/22/16 131 lb 9.6 oz (59.7 kg)  09/20/16 131 lb 9.6 oz (59.7 kg)  08/16/16 132 lb 6.4 oz (60.1 kg)   BP Readings from Last 3 Encounters:  09/22/16 99/60  08/16/16 108/71  08/01/16 114/71    Physical Exam  Constitutional: She is oriented to person, place, and time. She appears well-developed. No distress.  HENT:  Head: Normocephalic and atraumatic.  Eyes: EOM are normal. Pupils are equal, round, and reactive to light. No scleral icterus.  Cardiovascular: Normal rate and regular rhythm.   No murmur heard. Pulmonary/Chest: Effort normal and breath sounds normal.  Abdominal: Soft. There is no tenderness. There is no guarding.  Musculoskeletal: Normal range of motion. She exhibits no edema.  Lymphadenopathy:    She has no cervical adenopathy.  Neurological: She is alert and oriented to person, place, and time. No cranial nerve deficit.  Skin: Skin is warm and dry. No rash noted.  Psychiatric: She has a normal mood and affect. Her behavior is normal. Judgment and thought content normal.  Vitals reviewed.   Assessment/Plan: 1. Anorexia nervosa -restored weight stable -continue with tx team  -continue medications; discussed possibly tapering zyprexa instead of BP medication.  -consider taking BP medication dose as prescribed on clinical days  2. Adjustment disorder with mixed anxiety and depressed mood -as above; reviewed PHQSADS  -improved anxiety scores, negative depressive score; continue to monitor.   3. Orthostatic hypotension -continue with fluids, propel  -return precautions given   4. Postural orthostatic  tachycardia syndrome -as above, since she is restored/recovery > 6 months, could consider zyprexa d/c.  -defer to cardiology/neuro for POTS medications.   5. Screening for genitourinary condition -no hematuria; mod leuks.  - POCT urinalysis dipstick   Follow-up:  Return in about 4 weeks (around 10/20/2016) for with Christianne Dolin, FNP-C, DE management.   Medical decision-making:  >25 minutes spent face to face with patient with more than 50% of appointment spent discussing diagnosis, management, follow-up, and reviewing the plan of care as noted above.

## 2016-09-25 ENCOUNTER — Ambulatory Visit (INDEPENDENT_AMBULATORY_CARE_PROVIDER_SITE_OTHER): Payer: BLUE CROSS/BLUE SHIELD | Admitting: Emergency Medicine

## 2016-09-25 VITALS — BP 110/78 | HR 99 | Temp 98.2°F | Resp 16 | Ht 66.5 in | Wt 133.6 lb

## 2016-09-25 DIAGNOSIS — J029 Acute pharyngitis, unspecified: Secondary | ICD-10-CM | POA: Diagnosis not present

## 2016-09-25 DIAGNOSIS — B349 Viral infection, unspecified: Secondary | ICD-10-CM | POA: Diagnosis not present

## 2016-09-25 LAB — POCT RAPID STREP A (OFFICE): Rapid Strep A Screen: NEGATIVE

## 2016-09-25 NOTE — Patient Instructions (Addendum)
     IF you received an x-ray today, you will receive an invoice from [facility]. Please contact [facility] at [phone] with questions or concerns regarding your invoice.   IF you received labwork today, you will receive an invoice from [facility]. Please contact [facility] at [phone] with questions or concerns regarding your invoice.   Our billing staff will not be able to assist you with questions regarding bills from these companies.  You will be contacted with the lab results as soon as they are available. The fastest way to get your results is to activate your My Chart account. Instructions are located on the last page of this paperwork. If you have not heard from us regarding the results in 2 weeks, please contact this office.     Pharyngitis Pharyngitis is a sore throat (pharynx). There is redness, pain, and swelling of your throat. Follow these instructions at home:  Drink enough fluids to keep your pee (urine) clear or pale yellow.  Only take medicine as told by your doctor.  You may get sick again if you do not take medicine as told. Finish your medicines, even if you start to feel better.  Do not take aspirin.  Rest.  Rinse your mouth (gargle) with salt water ( tsp of salt per 1 qt of water) every 1-2 hours. This will help the pain.  If you are not at risk for choking, you can suck on hard candy or sore throat lozenges. Contact a doctor if:  You have large, tender lumps on your neck.  You have a rash.  You cough up green, yellow-brown, or bloody spit. Get help right away if:  You have a stiff neck.  You drool or cannot swallow liquids.  You throw up (vomit) or are not able to keep medicine or liquids down.  You have very bad pain that does not go away with medicine.  You have problems breathing (not from a stuffy nose). This information is not intended to replace advice given to you by your health care provider. Make sure you  discuss any questions you have with your health care provider. Document Released: 12/27/2007 Document Revised: 12/16/2015 Document Reviewed: 03/17/2013 Elsevier Interactive Patient Education  2017 Elsevier Inc.  

## 2016-09-25 NOTE — Progress Notes (Signed)
Jessica [name] 21 y.o.   Chief Complaint  Patient presents with  . Sore Throat    HISTORY OF PRESENT ILLNESS: This is a 21 y.o. female complaining of sore throat x 2-3 days.  Sore Throat   This is a new problem. The current episode started in the past 7 days. The problem has been waxing and waning. There has been no fever. The pain is at a severity of 2/10. The pain is mild. Pertinent negatives include no abdominal pain, congestion, coughing, diarrhea, ear discharge, ear pain, headaches, neck pain, shortness of breath, swollen glands or vomiting. Associated symptoms comments: Painful swallowing. She has tried nothing for the symptoms.     Prior to Admission medications   Medication Sig Start Date End Date Taking? Authorizing Provider  atenolol (TENORMIN) 25 MG tablet Take 0.5 tablets (12.5 mg total) by mouth daily. 07/27/15  Yes Tyrone Nine, MD  bifidobacterium infantis (ALIGN) capsule Take 1 capsule by mouth daily.   Yes Historical Provider, MD  Calcium-Vitamin D-Vitamin K (VIACTIV PO) Take 1 tablet by mouth daily. Reported on 01/04/2016   Yes Historical Provider, MD  fludrocortisone (FLORINEF) 0.1 MG tablet Take 1 tablet by mouth daily 04/13/15  Yes Owens Shark, MD  FLUoxetine (PROZAC) 40 MG capsule TAKE 1 CAPSULE (40 MG TOTAL) BY MOUTH DAILY. 08/16/16  Yes Verneda Skill, FNP  FLUoxetine (PROZAC) 40 MG capsule TAKE 1 CAPSULE BY MOUTH DAILY. 09/04/16  Yes Verneda Skill, FNP  fluticasone (FLONASE) 50 MCG/ACT nasal spray Place 2 sprays into both nostrils daily. 11/16/15  Yes Christianne Dolin, NP  ibuprofen (ADVIL,MOTRIN) 800 MG tablet Take 1 tablet (800 mg total) by mouth every 8 (eight) hours as needed for mild pain or moderate pain. 12/10/15  Yes Trixie Dredge, PA-C  JUNEL 1.5/30 1.5-30 MG-MCG tablet Take 1 tablet by mouth daily. 08/29/16  Yes [name] [name], PA-C  Multiple Vitamin (MULTIVITAMIN WITH MINERALS) TABS tablet Take 1 tablet by mouth daily.   Yes Historical Provider, MD    OLANZapine (ZYPREXA) 2.5 MG tablet Take 0.5 tablets (1.25 mg total) by mouth at bedtime. 08/16/16  Yes Verneda Skill, FNP  pantoprazole (PROTONIX) 40 MG tablet  08/15/16  Yes Historical Provider, MD  SUMAtriptan (IMITREX) 25 MG tablet Take 25 mg by mouth every 2 (two) hours as needed for migraine. May repeat in 2 hours if headache persists or recurs.   Yes Historical Provider, MD  Topiramate ER 150 MG CS24 TAKE 1 CAPSULE BY MOUTH AT NIGHTTIME 01/07/16  Yes Elveria Rising, NP    Allergies  Allergen Reactions  . Amoxicillin Rash    Patient Active Problem List   Diagnosis Date Noted  . Hematuria 03/22/2016  . Nonspecific abnormal results of endocrine function study 09/21/2015  . Adjustment disorder with mixed anxiety and depressed mood 07/13/2015  . Chronic bilateral lower abdominal pain 05/24/2015  . Anorexia nervosa 04/29/2015  . Postural orthostatic tachycardia syndrome 09/26/2014  . Migraine without aura 10/30/2012  . Episodic tension type headache 10/30/2012  . Orthostatic hypotension 10/30/2012    Past Medical History:  Diagnosis Date  . Migraine headache   . POTS (postural orthostatic tachycardia syndrome)   . Syncope 09/22/2014    Past Surgical History:  Procedure Laterality Date  . ANTERIOR CRUCIATE LIGAMENT REPAIR  03/2010  . EYE SURGERY  2000   clogged tear duct  . WISDOM TOOTH EXTRACTION      Social History   Social History  . Marital status: Single  Spouse name: N/A  . Number of children: N/A  . Years of education: N/A   Occupational History  . student     [employer]   Social History Main Topics  . Smoking status: Never Smoker  . Smokeless tobacco: Never Used  . Alcohol use No  . Drug use: No  . Sexual activity: No   Other Topics Concern  . 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    She is majoring in Nursing.    She lives with her parents and siblings.    She enjoys  school, shopping,watching TV, drinking decaf coffee and helping with children at church.     Family History  Problem Relation Age of Onset  . Migraines Mother     Started 4th or 5th grade  . Seizures Mother     Febrile Seizures as a child  . Seizures Father     Febrile Seizures as a child  . Hypertension Father   . Kidney Stones Father   . Migraines Maternal Aunt   . Migraines Maternal Uncle   . Diabetes Maternal Grandmother   . Stroke Maternal Grandfather     mini-stroke  . Cancer Maternal Grandfather     Bladder cancer, Died at 2868  . Diabetes Maternal Grandfather   . Mental retardation Other     Maternal Second Cousin  . Other Other     Maternal Great Uncle had some sort of Retinal Vessel Occlusion  . Breast cancer Maternal Aunt   . Cancer Maternal Aunt     Peritoneal CA  . Eating disorder Maternal Aunt   . Depression      Father's side of the family     Review of Systems  Constitutional: Positive for malaise/fatigue. Negative for chills and fever.  HENT: Positive for sore throat. Negative for congestion, ear discharge, ear pain and nosebleeds.   Eyes: Negative for discharge and redness.  Respiratory: Negative for cough, shortness of breath and wheezing.   Cardiovascular: Negative for chest pain and palpitations.  Gastrointestinal: Negative for abdominal pain, diarrhea, nausea and vomiting.  Genitourinary: Negative for dysuria and hematuria.  Musculoskeletal: Negative for myalgias and neck pain.  Skin: Negative for rash.  Neurological: Positive for weakness. Negative for dizziness and headaches.  All other systems reviewed and are negative.  Vitals:   09/25/16 0908  BP: 110/78  Pulse: 99  Resp: 16  Temp: 98.2 F (36.8 C)     Physical Exam  Constitutional: She is oriented to person, place, and time. She appears well-developed and well-nourished.  HENT:  Head: Normocephalic and atraumatic.  Mouth/Throat: Posterior oropharyngeal erythema (mild erythema)  present. No oropharyngeal exudate.  Eyes: Conjunctivae and EOM are normal. Pupils are equal, round, and reactive to light.  Neck: Normal range of motion. Neck supple. No JVD present. No thyromegaly present.  Cardiovascular: Normal rate, regular rhythm and normal heart sounds.   Pulmonary/Chest: Effort normal and breath sounds normal.  Abdominal: Soft. There is no tenderness.  Musculoskeletal: Normal range of motion.  Lymphadenopathy:    She has no cervical adenopathy.  Neurological: She is alert and oriented to person, place, and time. No sensory deficit. She exhibits normal muscle tone.  Skin: Skin is warm and dry. Capillary refill takes less than 2 seconds.  Psychiatric: She has a normal mood and affect. Her behavior is normal.  Vitals reviewed.    ASSESSMENT & PLAN: [name] was  seen today for sore throat.  Diagnoses and all orders for this visit:  Acute pharyngitis, unspecified etiology -     POCT rapid strep A -     Culture, Group A Strep  Viral illness    Patient Instructions       IF you received an x-ray today, you will receive an invoice from [facility]. Please contact [facility] at [phone] with questions or concerns regarding your invoice.   IF you received labwork today, you will receive an invoice from [facility]. Please contact [facility] at [phone] with questions or concerns regarding your invoice.   Our billing staff will not be able to assist you with questions regarding bills from these companies.  You will be contacted with the lab results as soon as they are available. The fastest way to get your results is to activate your My Chart account. Instructions are located on the last page of this paperwork. If you have not heard from [place] regarding the results in 2 weeks, please contact this office.      Pharyngitis Pharyngitis is a sore throat (pharynx). There is redness, pain, and swelling of your throat. Follow these instructions at  home:  Drink enough fluids to keep your pee (urine) clear or pale yellow.  Only take medicine as told by your doctor.  You may get sick again if you do not take medicine as told. Finish your medicines, even if you start to feel better.  Do not take aspirin.  Rest.  Rinse your mouth (gargle) with salt water ( tsp of salt per 1 qt of water) every 1-2 hours. This will help the pain.  If you are not at risk for choking, you can suck on hard candy or sore throat lozenges. Contact a doctor if:  You have large, tender lumps on your neck.  You have a rash.  You cough up green, yellow-brown, or bloody spit. Get help right away if:  You have a stiff neck.  You drool or cannot swallow liquids.  You throw up (vomit) or are not able to keep medicine or liquids down.  You have very bad pain that does not go away with medicine.  You have problems breathing (not from a stuffy nose). This information is not intended to replace advice given to you by your health care provider. Make sure you discuss any questions you have with your health care provider. Document Released: 12/27/2007 Document Revised: 12/16/2015 Document Reviewed: 03/17/2013 Elsevier Interactive Patient Education  2017 Elsevier Inc.       Edwina Barth, MD [facility]

## 2016-09-28 LAB — CULTURE, GROUP A STREP: Strep A Culture: NEGATIVE

## 2016-10-04 DIAGNOSIS — F419 Anxiety disorder, unspecified: Secondary | ICD-10-CM | POA: Diagnosis not present

## 2016-10-09 NOTE — Progress Notes (Signed)
Patient: Jessica [name] MRN: [id number] Sex: female DOB: [birth year]  Provider: Elveria Risingina Darrow Barreiro, NP Location of Care: [facility]  Note type: Routine return visit  History of Present Illness: Referral Source: Theora Gianottihelle Jeffrey, PA-C [facility]: [facility] Chief Complaint: Migraine without aura and without status migrainosus, not intractable; Postural orthostatic tachycardia syndrome; Episodic tension-type headache, not intractable; Concussion with loss of consciousness, 30 minutes or  less, sequela; Neck sprain and strain, initial encounter  Jessica [name] is a 21 y.o. young woman with history of migraines, episodes of orthostatic hypotension, palpitations, syncope, closed head injury and anorexia nervosa. She was last seen January 06, 2016. Jessica [name] has migraine without aura and neurally mediated syncope. She has vasovagal syncope with orthostatic intolerance that is similar to POTS. This has been treated with a combination of hydration, exercise, elevation of the head of her bed, atenolol given twice a day, midodrine every four hours during the daytime as needed for fatigue and Florinef. She has been taking Qudexy XR 150mg  for migraine prophylaxis, and was doing well until she fell and hit her head on July 15, 2015. She had symptoms of post concussion syndrome but fortunately has recovered well.   Jessica [name] is in college and doing well. She is a Theatre stage managernursing student and says that she fainted twice last semester during clinical experiences. She says that her headaches have not been problematic.  Jessica [name] says that she has been trying to drink more water and averages about 50 oz of water per day. She sleeps 6-8 hours at night and has been trying to follow the prescribed exercise program given to her by her cardiologist as part of the POTS treatment. She denies skipping meals.   Neither Jessica [name] nor her mother have other health concerns for her today other than previously  mentioned.  Review of Systems: Please see the HPI for neurologic and other pertinent review of systems. Otherwise, the following systems are noncontributory including constitutional, eyes, ears, nose and throat, cardiovascular, respiratory, gastrointestinal, genitourinary, musculoskeletal, skin, endocrine, hematologic/lymph, allergic/immunologic and psychiatric.   Past Medical History:  Diagnosis Date  . Migraine headache   . POTS (postural orthostatic tachycardia syndrome)   . Syncope 09/22/2014   Hospitalizations: No., Head Injury: No., Nervous System Infections: No., Immunizations up to date: Yes.   Past Medical History Comments: She had onset of headaches, some of them migraines beginning in the 4th or 5th grade. These involve stabbing left-sided pain that lasts for an hour before easing. The episodes are recurrent and are associated with nausea and vomiting, left sided throbbing, no sensitivity to light, sound, or movement. The patient recently had at least one episode of 10-15 minutes of tingling in her face and fingers associated with headaches. She also had episodes of what appeared to be positional vertigo, but became syncope. She was evaluated by Dr. Darlis LoanGreg Tatum on April 08, 2012. He noted a previous evaluation, June 22, 2011 for syncope. EKG and echocardiogram were normal. 24-hour Holter monitor was normal. She had persistent episodes of tachycardia and dizziness following the evaluation. She has had initial improvement with Florinef; however, on 0.3 mg per day, she did not have persistent improvement. She complained of three episodes of loss of consciousness per month. On March 04, 2012, she had an episode of stiffening which was unusual. A decision was made to place her on atenolol when her pulse went up from 68 to 105 from supine to standing. She had a closed head injury from a  fall on July 15, 2015 and had post concussion syndrome symptoms for a couple of months  afterwards.  Surgical History Past Surgical History:  Procedure Laterality Date  . ANTERIOR CRUCIATE LIGAMENT REPAIR  03/2010  . EYE SURGERY  2000   clogged tear duct  . WISDOM TOOTH EXTRACTION      Family History family history includes Breast cancer in her maternal aunt; Cancer in her maternal aunt and maternal grandfather; Diabetes in her maternal grandfather and maternal grandmother; Eating disorder in her maternal aunt; Hypertension in her father; Kidney Stones in her father; Mental retardation in her other; Migraines in her maternal aunt, maternal uncle, and mother; Other in her other; Seizures in her father and mother; Stroke in her maternal grandfather. Family History is otherwise negative for migraines, seizures, cognitive impairment, blindness, deafness, birth defects, chromosomal disorder, autism.  Social History Social History   Social History  . Marital status: Single    Spouse name: N/A  . Number of children: N/A  . Years of education: N/A   Occupational History  . student     [employer]   Social History Main Topics  . Smoking status: Never Smoker  . Smokeless tobacco: Never Used  . Alcohol use No  . Drug use: No  . Sexual activity: No   Other Topics Concern  . 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    She is majoring in Nursing.    She lives with her parents and siblings.    She enjoys school, shopping,watching TV, drinking decaf coffee and helping with children at church.     Allergies Allergies  Allergen Reactions  . Amoxicillin Rash    Physical Exam BP 90/60   Pulse 88   Ht 5' 5.25" (1.657 m)   Wt 131 lb 6.4 oz (59.6 kg)   LMP 09/21/2016   BMI 21.70 kg/m  General: alert, well developed, well nourished girl, in no acute distress, right-handed, brown hair, brown eyes  Head: normocephalic, no dysmorphic features; no localized tenderness  Ears, Nose and Throat: Otoscopic:  tympanic membranes normal . Pharynx: oropharynx is pink without exudates or tonsillar hypertrophy.  Neck: supple, full range of motion, no cranial or cervical bruits. She complains of soreness in the left posterior neck with flexion and rotational movements.  Respiratory: auscultation clear  Cardiovascular: no murmurs, pulses are normal  Musculoskeletal: no skeletal deformities or apparent scoliosis  Skin: no rashes or neurocutaneous lesions  Neurologic Exam  Mental Status: alert; oriented to person, place, and year; knowledge is normal for age; language is normal  Cranial Nerves: visual fields are full to double simultaneous stimuli; extraocular movements are full and conjugate; pupils are round reactive to light; funduscopic examination shows sharp disc margins with normal vessels; symmetric facial strength; midline tongue and uvula; hearing is normal and symmetric  Motor: Normal strength, tone, and mass; good fine motor movements; no pronator drift.  Sensory: intact responses to touch and temperature  Coordination: good finger-to-nose, rapid repetitive alternating movements and finger apposition  Gait and Station: normal gait and station; patient is able to walk on heels, toes and tandem without difficulty; balance is adequate; Romberg exam is negative; Gower response is negative  Reflexes: symmetric and diminished bilaterally; no clonus; bilateral flexor plantar responses  Impression 1. Migraine with aura, without status migranosus, not intractable 2. Postural orthostatic tachycardia syndrome 3. History of episodic tension type headaches 4. History of syncope 5.  Anorexia nervosa 6. History of concussion with loss of consciousness   Recommendations for plan of care The patient's previous [facility] records were reviewed. Keundra has neither had nor required imaging or lab studies since the last visit. She is a 21 year old young woman history of migraines, episodes of orthostatic  hypotension, palpitations, syncope, anorexia nervosa and closed head injury. Delancey is taking and tolerating Qudexy XR 150mg  for migraine prevention and is doing well at this time. I reminded her about the need for her to be very well hydrated while taking this medication. I will see her back in follow up in 6 months or sooner if needed.   The medication list was reviewed and reconciled.  No changes were made in the prescribed medications today.  A complete medication list was provided to the patient.   Allergies as of 10/11/2016      Reactions   Amoxicillin Rash      Medication List       Accurate as of 10/11/16 10:52 PM. Always use your most recent med list.          atenolol 25 MG tablet Commonly known as:  TENORMIN Take 0.5 tablets (12.5 mg total) by mouth daily.   bifidobacterium infantis capsule Take 1 capsule by mouth daily.   fludrocortisone 0.1 MG tablet Commonly known as:  FLORINEF Take 1 tablet by mouth daily   FLUoxetine 40 MG capsule Commonly known as:  PROZAC TAKE 1 CAPSULE (40 MG TOTAL) BY MOUTH DAILY.   fluticasone 50 MCG/ACT nasal spray Commonly known as:  FLONASE Place 2 sprays into both nostrils daily.   ibuprofen 800 MG tablet Commonly known as:  ADVIL,MOTRIN Take 1 tablet (800 mg total) by mouth every 8 (eight) hours as needed for mild pain or moderate pain.   JUNEL 1.5/30 1.5-30 MG-MCG tablet Generic drug:  Norethindrone Acetate-Ethinyl Estradiol Take 1 tablet by mouth daily.   multivitamin with minerals Tabs tablet Take 1 tablet by mouth daily.   OLANZapine 2.5 MG tablet Commonly known as:  ZYPREXA Take 0.5 tablets (1.25 mg total) by mouth at bedtime.   pantoprazole 40 MG tablet Commonly known as:  PROTONIX   QUDEXY XR 150 MG Cs24 Generic drug:  Topiramate ER Take 1 capsule at bedtime   SUMAtriptan 25 MG tablet Commonly known as:  IMITREX Take 25 mg by mouth every 2 (two) hours as needed for migraine. May repeat in 2 hours if headache  persists or recurs.       Dr. Sharene Skeans was consulted regarding the patient.   Total time spent with the patient was 20 minutes, of which 50% or more was spent in counseling and coordination of care.   Elveria Rising NP-C

## 2016-10-11 ENCOUNTER — Encounter (INDEPENDENT_AMBULATORY_CARE_PROVIDER_SITE_OTHER): Payer: Self-pay | Admitting: Family

## 2016-10-11 ENCOUNTER — Ambulatory Visit (INDEPENDENT_AMBULATORY_CARE_PROVIDER_SITE_OTHER): Payer: BLUE CROSS/BLUE SHIELD | Admitting: Family

## 2016-10-11 VITALS — BP 90/60 | HR 88 | Ht 65.25 in | Wt 131.4 lb

## 2016-10-11 DIAGNOSIS — I951 Orthostatic hypotension: Secondary | ICD-10-CM | POA: Diagnosis not present

## 2016-10-11 DIAGNOSIS — G44219 Episodic tension-type headache, not intractable: Secondary | ICD-10-CM | POA: Diagnosis not present

## 2016-10-11 DIAGNOSIS — F5000 Anorexia nervosa, unspecified: Secondary | ICD-10-CM | POA: Diagnosis not present

## 2016-10-11 DIAGNOSIS — F4323 Adjustment disorder with mixed anxiety and depressed mood: Secondary | ICD-10-CM

## 2016-10-11 DIAGNOSIS — R000 Tachycardia, unspecified: Secondary | ICD-10-CM | POA: Diagnosis not present

## 2016-10-11 DIAGNOSIS — G43009 Migraine without aura, not intractable, without status migrainosus: Secondary | ICD-10-CM | POA: Diagnosis not present

## 2016-10-11 DIAGNOSIS — F419 Anxiety disorder, unspecified: Secondary | ICD-10-CM | POA: Diagnosis not present

## 2016-10-11 DIAGNOSIS — G90A Postural orthostatic tachycardia syndrome (POTS): Secondary | ICD-10-CM

## 2016-10-11 MED ORDER — QUDEXY XR 150 MG PO CS24: EXTENDED_RELEASE_CAPSULE | ORAL | 5 refills | Status: DC

## 2016-10-11 NOTE — Patient Instructions (Signed)
Continue your medication as you have been taking it. Remember that you need to be very well hydrated while taking Qudexy XR.  Let me know if your headaches become more frequent or more severe.   Please plan to return for follow up in 6 months or sooner if needed.

## 2016-10-18 ENCOUNTER — Encounter: Payer: Self-pay | Admitting: Physician Assistant

## 2016-10-18 DIAGNOSIS — F419 Anxiety disorder, unspecified: Secondary | ICD-10-CM | POA: Diagnosis not present

## 2016-10-25 ENCOUNTER — Ambulatory Visit: Payer: BLUE CROSS/BLUE SHIELD | Admitting: *Deleted

## 2016-10-25 ENCOUNTER — Encounter: Payer: BLUE CROSS/BLUE SHIELD | Attending: Pediatrics | Admitting: *Deleted

## 2016-10-25 ENCOUNTER — Ambulatory Visit (INDEPENDENT_AMBULATORY_CARE_PROVIDER_SITE_OTHER): Payer: BLUE CROSS/BLUE SHIELD | Admitting: Family

## 2016-10-25 ENCOUNTER — Encounter: Payer: Self-pay | Admitting: Family

## 2016-10-25 VITALS — BP 105/72 | HR 80 | Wt 130.4 lb

## 2016-10-25 DIAGNOSIS — F5000 Anorexia nervosa, unspecified: Secondary | ICD-10-CM | POA: Diagnosis not present

## 2016-10-25 DIAGNOSIS — Z713 Dietary counseling and surveillance: Secondary | ICD-10-CM | POA: Insufficient documentation

## 2016-10-25 DIAGNOSIS — R5383 Other fatigue: Secondary | ICD-10-CM

## 2016-10-25 DIAGNOSIS — F4323 Adjustment disorder with mixed anxiety and depressed mood: Secondary | ICD-10-CM | POA: Diagnosis not present

## 2016-10-25 DIAGNOSIS — Z1389 Encounter for screening for other disorder: Secondary | ICD-10-CM | POA: Diagnosis not present

## 2016-10-25 DIAGNOSIS — F509 Eating disorder, unspecified: Secondary | ICD-10-CM | POA: Insufficient documentation

## 2016-10-25 DIAGNOSIS — F419 Anxiety disorder, unspecified: Secondary | ICD-10-CM | POA: Diagnosis not present

## 2016-10-25 LAB — POCT URINALYSIS DIPSTICK
Bilirubin, UA: NEGATIVE
GLUCOSE UA: NEGATIVE
Ketones, UA: NEGATIVE
NITRITE UA: NEGATIVE
RBC UA: NEGATIVE
SPEC GRAV UA: 1.005 (ref 1.030–1.035)
UROBILINOGEN UA: NEGATIVE (ref ?–2.0)
pH, UA: 7 (ref 5.0–8.0)

## 2016-10-25 LAB — POCT HEMOGLOBIN: HEMOGLOBIN: 13.1 g/dL (ref 12.2–16.2)

## 2016-10-25 NOTE — Progress Notes (Signed)
Appointment start time: 1030  Appointment end time: 1130  Patient was seen on 10/25/16 for nutrition counseling pertaining to disordered eating.  She is accompanied by her boyfriend  Primary care provider: Porfirio Oar Therapist: Noni Saupe weekly Any other medical team members: Dr. Karlene Einstein medicine NPs Parents: [name]  Assessment School is going well.  Has 3 more weeks Had a job interview yesterday  Stomach is ok Found out she has hemangioma on her liver  Has not seen GI. Is taking protonix and it helps. Not pooping because she isn't taking her probiotic.  Forgets No dizziness Started taking BP medication again  Thinks eating is going well. Is fighting back against diet culture in her church.  She is challenging diet culture  Is not getting enough water  Physical activity: none    Growth Metrics: Ideal BMI for age: 17.6 BMI current: 21 % Ideal:  100% Previous growth data: weight/age  67-90th%; height/age at 75%; BMI/age 73-85% Goal BMI range based on growth chart data: 22-25 Goal weight range based on growth chart data: 130-145 lb No further weight restoration needed.  However, she is still restricting/appetite suppressed with Topomax.  When she eats intuitively, she will most likely gain a little more weight   Mental health diagnosis: AN, B/P subtype   Dietary assessment: mom says she's always been picky; currently following gluten-free diet  B; 2 waffles with nutella L 2 grilled chicken tacos.  Chips and salsa Cheesecake  D: chicken with honey orange glaze, zucchini, rice S: handful caramel m&m  Easter B: protein bar Starving (didn't have enough breakfast) L: brat, gluten free pasta salad, chips and salsa S: pecan pie muffins D: ham, potato salad, deviled egg, rice with broccoli and cheese S: cant' remember   Physical activity: none.     Estimated energy intake: 1700-1800  Estimated energy needs: 2200 kcal for weight maintenance 275  g CHO 110 g pro 73 g fat  Nutrition Diagnosis: NI-1.4 Inadequate energy intake As related to restricting and purging.  As evidenced by dietary recall  Intervention/Goals:  Discussed overall progress through recovery.  Great job! Stay consistent  Monitoring and Evaluation: Patient will follow up in 8 weeks.

## 2016-10-25 NOTE — Progress Notes (Signed)
THIS RECORD MAY CONTAIN CONFIDENTIAL INFORMATION THAT SHOULD NOT BE RELEASED WITHOUT REVIEW OF THE SERVICE PROVIDER.  Adolescent Medicine Consultation Follow-Up Visit Jessica [name]  is a 21 y.o. female referred by Porfirio Oar, PA-C here today for follow-up regarding AN and adjustment disorder with anxiety/depressed mood.    Last seen in [facility] on 09/22/16 for same.  Plan at last visit included no med changes; discussed not weaning BP medication d/t dizziness.   - Pertinent Labs? No - Growth Chart Viewed? no   History was provided by the patient and mother.  PCP Confirmed?  yes  My Chart Activated?   yes    Chief Complaint  Patient presents with  . Follow-up  . Eating Disorder    HPI:    -Taking whole dose of BP meds and is feeling better, no dizziness.  -sees GI next week  -no complaints or concerns at present.  -taking all meds as prescribed.   Review of Systems  Constitutional: Positive for malaise/fatigue.  HENT: Negative for sore throat.   Eyes: Negative for double vision.  Respiratory: Negative for shortness of breath.   Cardiovascular: Negative for chest pain and palpitations.  Gastrointestinal: Negative for abdominal pain, constipation, diarrhea, nausea and vomiting.  Genitourinary: Negative for dysuria.  Musculoskeletal: Negative for joint pain and myalgias.  Skin: Negative for rash.  Neurological: Negative for dizziness and headaches.  Endo/Heme/Allergies: Does not bruise/bleed easily.   PHQ-SADS 10/25/2016 09/22/2016 08/16/2016  PHQ-15 8 10 11   GAD-7 2 2 5   PHQ-9 7 8 6   Suicidal Ideation No No No  Comment somewhat difficult somewhat difficult  somewhat difficult     Patient's last menstrual period was 10/15/2016 (within days). Allergies  Allergen Reactions  . Amoxicillin Rash   Outpatient Medications Prior to Visit  Medication Sig Dispense Refill  . atenolol (TENORMIN) 25 MG tablet Take 0.5 tablets (12.5 mg total) by mouth  daily.    . bifidobacterium infantis (ALIGN) capsule Take 1 capsule by mouth daily.    . fludrocortisone (FLORINEF) 0.1 MG tablet Take 1 tablet by mouth daily 90 tablet 5  . FLUoxetine (PROZAC) 40 MG capsule TAKE 1 CAPSULE (40 MG TOTAL) BY MOUTH DAILY. 30 capsule 3  . fluticasone (FLONASE) 50 MCG/ACT nasal spray Place 2 sprays into both nostrils daily. 16 g 12  . ibuprofen (ADVIL,MOTRIN) 800 MG tablet Take 1 tablet (800 mg total) by mouth every 8 (eight) hours as needed for mild pain or moderate pain. 15 tablet 0  . JUNEL 1.5/30 1.5-30 MG-MCG tablet Take 1 tablet by mouth daily. 3 Package 3  . Multiple Vitamin (MULTIVITAMIN WITH MINERALS) TABS tablet Take 1 tablet by mouth daily.    Marland Kitchen OLANZapine (ZYPREXA) 2.5 MG tablet Take 0.5 tablets (1.25 mg total) by mouth at bedtime. 30 tablet 1  . pantoprazole (PROTONIX) 40 MG tablet     . QUDEXY XR 150 MG CS24 Take 1 capsule at bedtime 30 each 5  . SUMAtriptan (IMITREX) 25 MG tablet Take 25 mg by mouth every 2 (two) hours as needed for migraine. May repeat in 2 hours if headache persists or recurs.     No facility-administered medications prior to visit.      Patient Active Problem List   Diagnosis Date Noted  . Hematuria 03/22/2016  . Adjustment disorder with mixed anxiety and depressed mood 07/13/2015  . Chronic bilateral lower abdominal pain 05/24/2015  . Anorexia nervosa 04/29/2015  . Postural orthostatic tachycardia syndrome 09/26/2014  . Migraine without  aura 10/30/2012  . Episodic tension type headache 10/30/2012  . Orthostatic hypotension 10/30/2012     The following portions of the patient's history were reviewed and updated as appropriate: allergies, current medications, past family history and problem list.  Physical Exam:  Vitals:   10/25/16 1417  BP: 105/72  Pulse: 80  Weight: 130 lb 6.4 oz (59.1 kg)   BP 105/72 (BP Location: Right Arm, Patient Position: Sitting, Cuff Size: Large)   Pulse 80   Wt 130 lb 6.4 oz (59.1 kg)    LMP 10/15/2016 (Within Days)   BMI 21.53 kg/m  Body mass index: body mass index is 21.53 kg/m. Growth percentile SmartLinks can only be used for patients less than 54 years old.  Wt Readings from Last 3 Encounters:  10/25/16 130 lb 6.4 oz (59.1 kg)  10/11/16 131 lb 6.4 oz (59.6 kg)  09/25/16 133 lb 9.6 oz (60.6 kg)   09/22/16 131 lb 9.6 oz (59.7 kg)  09/20/16 131 lb 9.6 oz (59.7 kg)  08/16/16 132 lb 6.4 oz (60.1 kg)   07/20/16 133 lb 12.8 oz (60.7 kg)  07/04/16 133 lb (60.3 kg)  06/22/16 134 lb (60.8 kg)   Growth Metrics: Ideal BMI for age: 64.6 BMI current: 21           % Ideal: 100% Previous growth data: weight/age  25-90th%; height/age at 75%; BMI/age 73-85% Goal BMI range based on growth chart data: 22-25 Goal weight range based on growth chart data: 130-145 lb No further weight restoration needed.  However, she is still restricting/appetite suppressed with Topomax.  When she eats intuitively, she will most likely gain a little more weight  Physical Exam  Constitutional: She is oriented to person, place, and time. She appears well-developed. No distress.  HENT:  oropharyngeal erythema  Eyes: EOM are normal. Pupils are equal, round, and reactive to light. No scleral icterus.  Cardiovascular: Normal rate, regular rhythm, normal heart sounds and intact distal pulses.   No murmur heard. Pulmonary/Chest: Effort normal and breath sounds normal.  Abdominal: Soft.  Musculoskeletal: Normal range of motion. She exhibits no edema.  Lymphadenopathy:    She has no cervical adenopathy.  Neurological: She is alert and oriented to person, place, and time. No cranial nerve deficit.  Skin: Skin is warm and dry. No rash noted.  Psychiatric: She has a normal mood and affect.  Vitals reviewed.   Assessment/Plan: 1. Anorexia nervosa -discussed low end of weight range -will continue to monitor  - no med changes today   2. Adjustment disorder with mixed anxiety and depressed  mood -continue with medications as prescribed -phqsads reviewed  3. Fatigue, unspecified type -hgb stable, reassurance given  - POCT hemoglobin  4. Screening for genitourinary condition Reviewed, mod leuks, trace protein, asymptomatic  - POCT urinalysis dipstick  Follow-up:  Return in about 4 weeks (around 11/22/2016) for with Christianne Dolin, FNP-C, DE management.   Medical decision-making:  >25 minutes spent face to face with patient with more than 50% of appointment spent discussing diagnosis, management, follow-up, and reviewing of option to return to PCP for management of DE ([name] manages DE patients), discussed medication compliance, including BP meds, reviewed PHQSADS.

## 2016-11-01 DIAGNOSIS — K7689 Other specified diseases of liver: Secondary | ICD-10-CM | POA: Diagnosis not present

## 2016-11-01 DIAGNOSIS — R198 Other specified symptoms and signs involving the digestive system and abdomen: Secondary | ICD-10-CM | POA: Diagnosis not present

## 2016-11-01 DIAGNOSIS — F419 Anxiety disorder, unspecified: Secondary | ICD-10-CM | POA: Diagnosis not present

## 2016-11-01 DIAGNOSIS — K219 Gastro-esophageal reflux disease without esophagitis: Secondary | ICD-10-CM | POA: Diagnosis not present

## 2016-11-02 ENCOUNTER — Other Ambulatory Visit: Payer: Self-pay | Admitting: Gastroenterology

## 2016-11-02 DIAGNOSIS — K769 Liver disease, unspecified: Secondary | ICD-10-CM

## 2016-11-08 DIAGNOSIS — F419 Anxiety disorder, unspecified: Secondary | ICD-10-CM | POA: Diagnosis not present

## 2016-11-10 ENCOUNTER — Encounter (INDEPENDENT_AMBULATORY_CARE_PROVIDER_SITE_OTHER): Payer: Self-pay | Admitting: Family

## 2016-11-10 ENCOUNTER — Encounter: Payer: Self-pay | Admitting: Family

## 2016-11-24 ENCOUNTER — Ambulatory Visit
Admission: RE | Admit: 2016-11-24 | Discharge: 2016-11-24 | Disposition: A | Discharge status: Home / Self Care | Payer: BLUE CROSS/BLUE SHIELD | Source: Ambulatory Visit | Attending: Gastroenterology | Admitting: Gastroenterology

## 2016-11-24 DIAGNOSIS — F419 Anxiety disorder, unspecified: Secondary | ICD-10-CM | POA: Diagnosis not present

## 2016-11-24 DIAGNOSIS — R109 Unspecified abdominal pain: Secondary | ICD-10-CM | POA: Diagnosis not present

## 2016-11-24 DIAGNOSIS — K769 Liver disease, unspecified: Secondary | ICD-10-CM

## 2016-11-24 MED ORDER — GADOXETATE DISODIUM 0.25 MMOL/ML IV SOLN
10.0000 mL | Freq: Once | INTRAVENOUS | Status: AC | PRN
  Administered 2016-11-24: 6 mL via INTRAVENOUS

## 2016-11-27 ENCOUNTER — Ambulatory Visit: Payer: BLUE CROSS/BLUE SHIELD | Admitting: Family

## 2016-11-29 DIAGNOSIS — F419 Anxiety disorder, unspecified: Secondary | ICD-10-CM | POA: Diagnosis not present

## 2016-11-30 ENCOUNTER — Encounter: Payer: Self-pay | Admitting: Physician Assistant

## 2016-11-30 ENCOUNTER — Ambulatory Visit (INDEPENDENT_AMBULATORY_CARE_PROVIDER_SITE_OTHER): Payer: BLUE CROSS/BLUE SHIELD | Admitting: Physician Assistant

## 2016-11-30 VITALS — BP 104/68 | HR 78 | Temp 98.5°F | Resp 17 | Ht 65.25 in | Wt 134.0 lb

## 2016-11-30 DIAGNOSIS — R300 Dysuria: Secondary | ICD-10-CM | POA: Diagnosis not present

## 2016-11-30 DIAGNOSIS — R3911 Hesitancy of micturition: Secondary | ICD-10-CM | POA: Diagnosis not present

## 2016-11-30 DIAGNOSIS — Z124 Encounter for screening for malignant neoplasm of cervix: Secondary | ICD-10-CM

## 2016-11-30 LAB — POCT WET + KOH PREP
Trich by wet prep: ABSENT
YEAST BY KOH: ABSENT
YEAST BY WET PREP: ABSENT

## 2016-11-30 LAB — POCT URINALYSIS DIP (MANUAL ENTRY)
Bilirubin, UA: NEGATIVE
Blood, UA: NEGATIVE
Glucose, UA: NEGATIVE mg/dL
Ketones, POC UA: NEGATIVE mg/dL
LEUKOCYTES UA: NEGATIVE
NITRITE UA: NEGATIVE
PROTEIN UA: NEGATIVE mg/dL
Spec Grav, UA: 1.025 (ref 1.010–1.025)
UROBILINOGEN UA: 0.2 U/dL
pH, UA: 5 (ref 5.0–8.0)

## 2016-11-30 LAB — POC MICROSCOPIC URINALYSIS (UMFC): Mucus: ABSENT

## 2016-11-30 NOTE — Progress Notes (Signed)
11/30/2016 at 8:52 PM  Jessica [name] / DOB: 05/07/1996 / MRN: [id number]  The patient has Migraine without aura; Episodic tension type headache; Orthostatic hypotension; Postural orthostatic tachycardia syndrome; Anorexia nervosa; Chronic bilateral lower abdominal pain; Adjustment disorder with mixed anxiety and depressed mood; and Hematuria on her problem list.  SUBJECTIVE  Jessica [name] is a 21 y.o. female who complains of urinary hesitancy x 5 days. Notes she had a MRI with contrast 6 days ago and since then she has noticed it has been harder to urinate. Has mild dysuria after she urinates. She denies anuria,  hematuria, urinary frequency, flank pain, abdominal pain, pelvic pain, genital irritation and vaginal discharge. Has not tried anything for relief. She is not sexually active. She has not been drinking as much water since she had the MRI. Typically gets about 64 oz a day but has only been drinking about 20 oz daily as she has had a cold and does not feel like drinking water.  She  has a past medical history of Migraine headache; POTS (postural orthostatic tachycardia syndrome); and Syncope (09/22/2014).    Medications reviewed and updated by myself where necessary, and exist elsewhere in the encounter.   Jessica [name] is allergic to amoxicillin. She  reports that she has never smoked. She has never used smokeless tobacco. She reports that she drinks alcohol. She reports that she does not use drugs. She  reports that she does not engage in sexual activity. The patient  has a past surgical history that includes Eye surgery (2000); Anterior cruciate ligament repair (03/2010); and Wisdom tooth extraction.  Her family history includes Breast cancer in her maternal aunt; Cancer in her maternal aunt and maternal grandfather; Diabetes in her maternal grandfather and maternal grandmother; Eating disorder in her maternal aunt; Hypertension in her father; Kidney Stones in her father; Mental retardation in  her other; Migraines in her maternal aunt, maternal uncle, and mother; Other in her other; Seizures in her father and mother; Stroke in her maternal grandfather.  Review of Systems  Constitutional: Negative for chills and diaphoresis.  Gastrointestinal: Negative for constipation, diarrhea, nausea and vomiting.    OBJECTIVE  Her  height is 5' 5.25" (1.657 m) and weight is 134 lb (60.8 kg). Her oral temperature is 98.5 F (36.9 C). Her blood pressure is 104/68 and her pulse is 78. Her respiration is 17 and oxygen saturation is 98%.  The patient's body mass index is 22.13 kg/m.  Physical Exam  Constitutional: She is oriented to person, place, and time. She appears well-developed and well-nourished. No distress.  HENT:  Head: Normocephalic.  Eyes: Conjunctivae are normal.  Neck: Normal range of motion.  GI: Soft.  Genitourinary: Vagina normal and uterus normal. There is no tenderness or lesion on the right labia. There is no tenderness or lesion on the left labia. Cervix exhibits no motion tenderness and no discharge. Right adnexum displays no mass, no tenderness and no fullness. Left adnexum displays no mass, no tenderness and no fullness. No vaginal discharge found.  Musculoskeletal: Normal range of motion.  Neurological: She is alert and oriented to person, place, and time.  Skin: Skin is warm and dry.    Results for orders placed or performed in visit on 11/30/16 (from the past 24 hour(s))  POCT urinalysis dipstick     Status: None   Collection Time: 11/30/16  4:02 PM  Result Value Ref Range   Color, UA yellow yellow   Clarity, UA clear clear  Glucose, UA negative negative mg/dL   Bilirubin, UA negative negative   Ketones, POC UA negative negative mg/dL   Spec Grav, UA 1.025 1.010 - 1.025   Blood, UA negative negative   pH, UA 5.0 5.0 - 8.0   Protein Ur, POC negative negative mg/dL   Urobilinogen, UA 0.2 0.2 or 1.0 E.U./dL   Nitrite, UA Negative Negative   Leukocytes, UA  Negative Negative  POCT Microscopic Urinalysis ([facility])     Status: Abnormal   Collection Time: 11/30/16  4:12 PM  Result Value Ref Range   WBC,UR,HPF,POC Few (A) None WBC/hpf   RBC,UR,HPF,POC None None RBC/hpf   Bacteria Many (A) None, Too numerous to count   Mucus Absent Absent   Epithelial Cells, UR Per Microscopy Few (A) None, Too numerous to count cells/hpf  POCT Wet + KOH Prep     Status: None   Collection Time: 11/30/16  5:18 PM  Result Value Ref Range   Yeast by KOH Absent Absent   Yeast by wet prep Absent Absent   WBC by wet prep Few Few   Clue Cells Wet Prep HPF POC None None   Trich by wet prep Absent Absent   Bacteria Wet Prep HPF POC Few Few   Epithelial Cells By [employer] Pref ([facility]) Few None, Few, Too numerous to count   RBC,UR,HPF,POC None None RBC/hpf    ASSESSMENT & PLAN  Maris was seen today for dysuria.  Diagnoses and all orders for this visit:  Dysuria -     POCT Microscopic Urinalysis ([facility]) -     POCT urinalysis dipstick -     Urine culture  Urinary hesitancy -     POCT Wet + KOH Prep -     CMP14+EGFR -     CBC with Differential/Platelet  Screening for cervical cancer -     Pap IG, CT/NG w/ reflex HPV when ASC-U - Considering we were doing a pelvic examination for evaluation of patient's acute complaint today, I proceeded to perform a pap, with patient's permission, as she has not yet received a pap smear.     Physical exam findings and POCT labs are reassuring. Await lab results. Pt instructed to increase water consumption to the recommended 64 oz per day. Given strict ED/return precautions.  The patient was advised to call or come back to clinic if she does not see an improvement in symptoms, or worsens with the above plan.   Tenna Delaine, PA-C [facility] 11/30/2016 8:52 PM

## 2016-11-30 NOTE — Patient Instructions (Addendum)
Your lab results are reassuring today in office. I will have your other labs back in a few days. While you are waiting for those results, please increase your water consumption to at least 64 oz a day. If you stop making urine or your symptoms worsen, please return to clinic or ED immediately. Thank you for letting me participate in your health and well being.     IF you received an x-ray today, you will receive an invoice from [facility]. Please contact [facility] at [phone] with questions or concerns regarding your invoice.   IF you received labwork today, you will receive an invoice from [facility]. Please contact [facility] at [phone] with questions or concerns regarding your invoice.   Our billing staff will not be able to assist you with questions regarding bills from these companies.  You will be contacted with the lab results as soon as they are available. The fastest way to get your results is to activate your My Chart account. Instructions are located on the last page of this paperwork. If you have not heard from us regarding the results in 2 weeks, please contact this office.

## 2016-12-01 LAB — CMP14+EGFR
ALT: 27 IU/L (ref 0–32)
AST: 16 IU/L (ref 0–40)
Albumin/Globulin Ratio: 1.8 (ref 1.2–2.2)
Albumin: 4.6 g/dL (ref 3.5–5.5)
Alkaline Phosphatase: 57 IU/L (ref 39–117)
BUN/Creatinine Ratio: 15 (ref 9–23)
BUN: 13 mg/dL (ref 6–20)
Bilirubin Total: 0.2 mg/dL (ref 0.0–1.2)
CALCIUM: 9.5 mg/dL (ref 8.7–10.2)
CO2: 21 mmol/L (ref 18–29)
CREATININE: 0.84 mg/dL (ref 0.57–1.00)
Chloride: 103 mmol/L (ref 96–106)
GFR, EST AFRICAN AMERICAN: 115 mL/min/{1.73_m2} (ref >59)
GFR, EST NON AFRICAN AMERICAN: 100 mL/min/{1.73_m2} (ref >59)
Globulin, Total: 2.6 g/dL (ref 1.5–4.5)
Glucose: 88 mg/dL (ref 65–99)
POTASSIUM: 4.2 mmol/L (ref 3.5–5.2)
Sodium: 141 mmol/L (ref 134–144)
Total Protein: 7.2 g/dL (ref 6.0–8.5)

## 2016-12-01 LAB — CBC WITH DIFFERENTIAL/PLATELET
BASOS: 1 %
Basophils Absolute: 0.1 10*3/uL (ref 0.0–0.2)
EOS (ABSOLUTE): 0.2 10*3/uL (ref 0.0–0.4)
Eos: 3 %
HEMOGLOBIN: 12.9 g/dL (ref 11.1–15.9)
Hematocrit: 40.9 % (ref 34.0–46.6)
IMMATURE GRANS (ABS): 0 10*3/uL (ref 0.0–0.1)
IMMATURE GRANULOCYTES: 0 %
LYMPHS: 32 %
Lymphocytes Absolute: 2.1 10*3/uL (ref 0.7–3.1)
MCH: 29.5 pg (ref 26.6–33.0)
MCHC: 31.5 g/dL (ref 31.5–35.7)
MCV: 94 fL (ref 79–97)
MONOCYTES: 7 %
Monocytes Absolute: 0.5 10*3/uL (ref 0.1–0.9)
NEUTROS ABS: 3.8 10*3/uL (ref 1.4–7.0)
NEUTROS PCT: 57 %
PLATELETS: 320 10*3/uL (ref 150–379)
RBC: 4.37 x10E6/uL (ref 3.77–5.28)
RDW: 12.6 % (ref 12.3–15.4)
WBC: 6.7 10*3/uL (ref 3.4–10.8)

## 2016-12-01 LAB — URINE CULTURE

## 2016-12-04 ENCOUNTER — Encounter: Payer: Self-pay | Admitting: Family

## 2016-12-04 ENCOUNTER — Ambulatory Visit (INDEPENDENT_AMBULATORY_CARE_PROVIDER_SITE_OTHER): Payer: BLUE CROSS/BLUE SHIELD | Admitting: Family

## 2016-12-04 VITALS — BP 106/70 | HR 83 | Ht 65.75 in | Wt 130.2 lb

## 2016-12-04 DIAGNOSIS — F5000 Anorexia nervosa, unspecified: Secondary | ICD-10-CM

## 2016-12-04 DIAGNOSIS — F4323 Adjustment disorder with mixed anxiety and depressed mood: Secondary | ICD-10-CM | POA: Diagnosis not present

## 2016-12-04 DIAGNOSIS — Z1389 Encounter for screening for other disorder: Secondary | ICD-10-CM | POA: Diagnosis not present

## 2016-12-04 MED ORDER — FLUOXETINE HCL 40 MG PO CAPS: ORAL_CAPSULE | ORAL | 3 refills | Status: DC

## 2016-12-04 NOTE — Progress Notes (Signed)
THIS RECORD MAY CONTAIN CONFIDENTIAL INFORMATION THAT SHOULD NOT BE RELEASED WITHOUT REVIEW OF THE SERVICE PROVIDER.  Adolescent Medicine Consultation Follow-Up Visit Jessica [name]  is a 21 y.o. female referred by Porfirio Oar, PA-C here today for follow-up regarding DE.    Last seen in Adolescent Medicine Clinic on 10/25/16 for same.  Plan at last visit included continued therapy, no med changes.   - Pertinent Labs? No - Growth Chart Viewed? no   History was provided by the patient.  PCP Confirmed?  yes  My Chart Activated?   yes    Chief Complaint  Patient presents with  . Follow-up  . Eating Disorder    HPI:    -Passed out in OR during a c-section on April 13.  -Had breakfast and a bottle of water; didn't want to go because she doesn't like the OR.  -She was standing there waiting for them to start; she was wearing a face shield mask; surgeon was starting to make incision and she slid down the wall; she felt she got too hot. -the next week she had clinical at [facility] and was watching circumcision and felt really hot. She walked away from the procedure and was fine after drinking water.  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 -Negative MRI liver. Labs reviewed from appt 4 days ago.   Review of Systems  Constitutional: Negative for malaise/fatigue.  Eyes: Negative for double vision.  Respiratory: Negative for shortness of breath.   Cardiovascular: Negative for chest pain and palpitations.  Gastrointestinal: Negative for abdominal pain, constipation, diarrhea, nausea and vomiting.  Genitourinary: Negative for dysuria.  Musculoskeletal: Negative for joint pain and myalgias.  Skin: Negative for rash.  Neurological: Negative for dizziness and headaches.  Endo/Heme/Allergies: Does not bruise/bleed easily.   Patient's last menstrual period was 11/14/2016 (approximate). Allergies  Allergen Reactions  . Amoxicillin Rash   Outpatient Medications Prior to Visit  Medication  Sig Dispense Refill  . atenolol (TENORMIN) 25 MG tablet Take 0.5 tablets (12.5 mg total) by mouth daily.    . bifidobacterium infantis (ALIGN) capsule Take 1 capsule by mouth daily.    . fludrocortisone (FLORINEF) 0.1 MG tablet Take 1 tablet by mouth daily 90 tablet 5  . FLUoxetine (PROZAC) 40 MG capsule TAKE 1 CAPSULE (40 MG TOTAL) BY MOUTH DAILY. 30 capsule 3  . fluticasone (FLONASE) 50 MCG/ACT nasal spray Place 2 sprays into both nostrils daily. 16 g 12  . ibuprofen (ADVIL,MOTRIN) 800 MG tablet Take 1 tablet (800 mg total) by mouth every 8 (eight) hours as needed for mild pain or moderate pain. 15 tablet 0  . JUNEL 1.5/30 1.5-30 MG-MCG tablet Take 1 tablet by mouth daily. 3 Package 3  . Multiple Vitamin (MULTIVITAMIN WITH MINERALS) TABS tablet Take 1 tablet by mouth daily.    Marland Kitchen OLANZapine (ZYPREXA) 2.5 MG tablet Take 0.5 tablets (1.25 mg total) by mouth at bedtime. 30 tablet 1  . pantoprazole (PROTONIX) 40 MG tablet     . QUDEXY XR 150 MG CS24 Take 1 capsule at bedtime 30 each 5  . SUMAtriptan (IMITREX) 25 MG tablet Take 25 mg by mouth every 2 (two) hours as needed for migraine. May repeat in 2 hours if headache persists or recurs.     No facility-administered medications prior to visit.      Patient Active Problem List   Diagnosis Date Noted  . Hematuria 03/22/2016  . Adjustment disorder with mixed anxiety and depressed mood 07/13/2015  . Chronic  bilateral lower abdominal pain 05/24/2015  . Anorexia nervosa 04/29/2015  . Postural orthostatic tachycardia syndrome 09/26/2014  . Migraine without aura 10/30/2012  . Episodic tension type headache 10/30/2012  . Orthostatic hypotension 10/30/2012    Physical Exam:  Vitals:   12/04/16 1030  BP: 106/70  Pulse: 83  Weight: 130 lb 3.2 oz (59.1 kg)  Height: 5' 5.75" (1.67 m)   BP 106/70 (BP Location: Right Arm, Patient Position: Sitting, Cuff Size: Normal)   Pulse 83   Ht 5' 5.75" (1.67 m)   Wt 130 lb 3.2 oz (59.1 kg)   LMP  11/14/2016 (Approximate)   BMI 21.18 kg/m  Body mass index: body mass index is 21.18 kg/m. Growth percentile SmartLinks can only be used for patients less than 809 years old.  Wt Readings from Last 3 Encounters:  12/04/16 130 lb 3.2 oz (59.1 kg)  11/30/16 134 lb (60.8 kg)  10/25/16 130 lb 6.4 oz (59.1 kg)   Physical Exam  Constitutional: She is oriented to person, place, and time. She appears well-developed. No distress.  HENT:  Head: Normocephalic and atraumatic.  Eyes: EOM are normal. Pupils are equal, round, and reactive to light. No scleral icterus.  Neck: No thyromegaly present.  Cardiovascular: Normal rate, regular rhythm, normal heart sounds and intact distal pulses.   No murmur heard. Pulmonary/Chest: Effort normal and breath sounds normal.  Abdominal: Soft.  Musculoskeletal: Normal range of motion. She exhibits no edema.  Lymphadenopathy:    She has no cervical adenopathy.  Neurological: She is alert and oriented to person, place, and time. No cranial nerve deficit.  Skin: Skin is warm and dry. No rash noted.  Psychiatric: She has a normal mood and affect.  Vitals reviewed.   Assessment/Plan:  1. Anorexia nervosa -continue with Prozac 40 mg  -she is weight stable -continue with Herbert SetaHeather and [name] for treatment team -OK to extend to 3 month follow-up; return precautions and sooner return indications were reviewed.  - FLUoxetine (PROZAC) 40 MG capsule; TAKE 1 CAPSULE (40 MG TOTAL) BY MOUTH DAILY.  Dispense: 30 capsule; Refill: 3  2. Adjustment disorder with mixed anxiety and depressed mood -continue as above.  - FLUoxetine (PROZAC) 40 MG capsule; TAKE 1 CAPSULE (40 MG TOTAL) BY MOUTH DAILY.  Dispense: 30 capsule; Refill: 3  3. Screening for genitourinary condition -was unable to give sample; urinated prior to appt  -reviewed labs from 5/10 PCP visit with patient; kidney function WNL    Follow-up:  Return in about 3 months (around 03/06/2017) for with Christianne Dolinhristy  Millican, FNP-C, DE management.    Medical decision-making:  >25 minutes spent face to face with patient with more than 50% of appointment spent counseling and coordinating care for DE management, including plan management and necessary follow-ups, return precautions.

## 2016-12-04 NOTE — Patient Instructions (Signed)
Let me know if you need anything before your next appointment

## 2016-12-08 ENCOUNTER — Other Ambulatory Visit: Payer: Self-pay | Admitting: Pediatrics

## 2016-12-08 DIAGNOSIS — F4323 Adjustment disorder with mixed anxiety and depressed mood: Secondary | ICD-10-CM

## 2016-12-08 DIAGNOSIS — F5000 Anorexia nervosa, unspecified: Secondary | ICD-10-CM

## 2016-12-08 DIAGNOSIS — F419 Anxiety disorder, unspecified: Secondary | ICD-10-CM | POA: Diagnosis not present

## 2016-12-12 LAB — GYN REPORT: PAP SMEAR COMMENT: 0

## 2016-12-12 LAB — SPECIMEN STATUS REPORT

## 2016-12-13 DIAGNOSIS — F419 Anxiety disorder, unspecified: Secondary | ICD-10-CM | POA: Diagnosis not present

## 2016-12-19 LAB — PAP IG, CT-NG, RFX HPV ASCU
Chlamydia, Nuc. Acid Amp: NEGATIVE
Gonococcus by Nucleic Acid Amp: NEGATIVE
PAP Smear Comment: 0

## 2016-12-20 DIAGNOSIS — F419 Anxiety disorder, unspecified: Secondary | ICD-10-CM | POA: Diagnosis not present

## 2016-12-21 ENCOUNTER — Emergency Department (HOSPITAL_BASED_OUTPATIENT_CLINIC_OR_DEPARTMENT_OTHER)
Admission: EM | Admit: 2016-12-21 | Discharge: 2016-12-22 | Disposition: A | Discharge status: Home / Self Care | Payer: BLUE CROSS/BLUE SHIELD | Attending: Emergency Medicine | Admitting: Emergency Medicine

## 2016-12-21 ENCOUNTER — Encounter (HOSPITAL_BASED_OUTPATIENT_CLINIC_OR_DEPARTMENT_OTHER): Payer: Self-pay | Admitting: *Deleted

## 2016-12-21 ENCOUNTER — Emergency Department (HOSPITAL_BASED_OUTPATIENT_CLINIC_OR_DEPARTMENT_OTHER): Payer: BLUE CROSS/BLUE SHIELD

## 2016-12-21 DIAGNOSIS — R509 Fever, unspecified: Secondary | ICD-10-CM | POA: Diagnosis not present

## 2016-12-21 DIAGNOSIS — R1031 Right lower quadrant pain: Secondary | ICD-10-CM | POA: Diagnosis not present

## 2016-12-21 DIAGNOSIS — R109 Unspecified abdominal pain: Secondary | ICD-10-CM | POA: Diagnosis not present

## 2016-12-21 DIAGNOSIS — R05 Cough: Secondary | ICD-10-CM | POA: Diagnosis not present

## 2016-12-21 DIAGNOSIS — E860 Dehydration: Secondary | ICD-10-CM | POA: Insufficient documentation

## 2016-12-21 DIAGNOSIS — Z79899 Other long term (current) drug therapy: Secondary | ICD-10-CM | POA: Diagnosis not present

## 2016-12-21 DIAGNOSIS — R1110 Vomiting, unspecified: Secondary | ICD-10-CM

## 2016-12-21 DIAGNOSIS — R112 Nausea with vomiting, unspecified: Secondary | ICD-10-CM | POA: Insufficient documentation

## 2016-12-21 DIAGNOSIS — R197 Diarrhea, unspecified: Secondary | ICD-10-CM | POA: Insufficient documentation

## 2016-12-21 DIAGNOSIS — R34 Anuria and oliguria: Secondary | ICD-10-CM | POA: Diagnosis not present

## 2016-12-21 HISTORY — DX: Disorder of the autonomic nervous system, unspecified: G90.9

## 2016-12-21 HISTORY — DX: Syncope and collapse: R55

## 2016-12-21 HISTORY — DX: Adjustment disorder with mixed anxiety and depressed mood: F43.23

## 2016-12-21 HISTORY — DX: Splenomegaly, not elsewhere classified: R16.1

## 2016-12-21 HISTORY — DX: Anorexia nervosa, unspecified: F50.00

## 2016-12-21 LAB — COMPREHENSIVE METABOLIC PANEL
ALT: 52 U/L (ref 14–54)
ANION GAP: 11 (ref 5–15)
AST: 30 U/L (ref 15–41)
Albumin: 4.9 g/dL (ref 3.5–5.0)
Alkaline Phosphatase: 54 U/L (ref 38–126)
BUN: 25 mg/dL — ABNORMAL HIGH (ref 6–20)
CHLORIDE: 104 mmol/L (ref 101–111)
CO2: 23 mmol/L (ref 22–32)
Calcium: 9.5 mg/dL (ref 8.9–10.3)
Creatinine, Ser: 0.88 mg/dL (ref 0.44–1.00)
GFR calc Af Amer: >60 mL/min (ref >60)
Glucose, Bld: 104 mg/dL — ABNORMAL HIGH (ref 65–99)
POTASSIUM: 3.5 mmol/L (ref 3.5–5.1)
SODIUM: 138 mmol/L (ref 135–145)
Total Bilirubin: 0.6 mg/dL (ref 0.3–1.2)
Total Protein: 8.4 g/dL — ABNORMAL HIGH (ref 6.5–8.1)

## 2016-12-21 LAB — CBC WITH DIFFERENTIAL/PLATELET
Basophils Absolute: 0 10*3/uL (ref 0.0–0.1)
Basophils Relative: 0 %
EOS ABS: 0 10*3/uL (ref 0.0–0.7)
EOS PCT: 0 %
HCT: 45.2 % (ref 36.0–46.0)
Hemoglobin: 15.6 g/dL — ABNORMAL HIGH (ref 12.0–15.0)
LYMPHS ABS: 0.6 10*3/uL — AB (ref 0.7–4.0)
Lymphocytes Relative: 5 %
MCH: 31 pg (ref 26.0–34.0)
MCHC: 34.5 g/dL (ref 30.0–36.0)
MCV: 89.9 fL (ref 78.0–100.0)
MONO ABS: 0.8 10*3/uL (ref 0.1–1.0)
Monocytes Relative: 6 %
Neutro Abs: 11.8 10*3/uL — ABNORMAL HIGH (ref 1.7–7.7)
Neutrophils Relative %: 89 %
PLATELETS: 277 10*3/uL (ref 150–400)
RBC: 5.03 MIL/uL (ref 3.87–5.11)
RDW: 12.6 % (ref 11.5–15.5)
WBC: 13.3 10*3/uL — ABNORMAL HIGH (ref 4.0–10.5)

## 2016-12-21 LAB — LIPASE, BLOOD: LIPASE: 19 U/L (ref 11–51)

## 2016-12-21 MED ORDER — ONDANSETRON 4 MG PO TBDP
4.0000 mg | ORAL_TABLET | Freq: Once | ORAL | Status: AC | PRN
  Administered 2016-12-21: 4 mg via ORAL
  Filled 2016-12-21: qty 1

## 2016-12-21 MED ORDER — SODIUM CHLORIDE 0.9 % IV BOLUS (SEPSIS)
1000.0000 mL | Freq: Once | INTRAVENOUS | Status: AC
  Administered 2016-12-22: 1000 mL via INTRAVENOUS

## 2016-12-21 MED ORDER — METOCLOPRAMIDE HCL 5 MG/ML IJ SOLN
10.0000 mg | Freq: Once | INTRAMUSCULAR | Status: AC
  Administered 2016-12-22: 10 mg via INTRAVENOUS
  Filled 2016-12-21: qty 2

## 2016-12-21 MED ORDER — SODIUM CHLORIDE 0.9 % IV SOLN
Freq: Once | INTRAVENOUS | Status: AC
  Administered 2016-12-21: 1000 mL via INTRAVENOUS

## 2016-12-21 NOTE — ED Provider Notes (Signed)
[facility] Provider Note   CSN: [id number] Arrival date & time: 12/21/16  2138   By signing my name below, I, Clarisse Gouge, attest that this documentation has been prepared under the direction and in the presence of Melburn Hake, [place]. Electronically Signed: Clarisse Gouge, Scribe. 12/21/16. 12:05 AM.   History   Chief Complaint Chief Complaint  Patient presents with  . Abdominal Pain   The history is provided by the patient and medical records. No language interpreter was used.    Jessica [name] is a 21 y.o. female with h/o POTS, GERD, splenomegaly, vasovagal syncope and anorexia nervosa presenting to the Emergency Department with chief complaint of severe, generalized abdominal pain onset last night ~10 PM. She notes profuse N/V/D of fluctuating degrees since onset and waking her at night, low grade fevers, mild nonproductive cough, generalized body aches, generalized abdominal pain. She states she has only urinated once today but denies dysuria. Pt describes 6/10 pain constantly, worse with vomiting and stools. She also describes small red streaks of bloody vomit. Tylenol taken at home with mild relief to body aches. Phenergan taken at home with minimal to no relief to N/V symptoms. Zofran given prior to evaluation with mild relief to N/V symptoms. No POTS medications taken today. 1 sick contact (sister at home) with much less severe symptoms noted at home 2-3 days ago. No recent abx use, lake or stream water consumption, recent hospitalizations or international travel noted. No congestion, chest pain, SOB, vaginal bleeding or discharge. No h/o abdominal surgeries. LNMP "last week". No other complaints at this time.   Past Medical History:  Diagnosis Date  . Adjustment disorder with mixed anxiety and depressed mood   . Anorexia nervosa   . Autonomic dysfunction   . Migraine headache   . POTS (postural orthostatic tachycardia syndrome)   . Splenomegaly   . Syncope  09/22/2014  . Vasovagal syncope     Patient Active Problem List   Diagnosis Date Noted  . Hematuria 03/22/2016  . Adjustment disorder with mixed anxiety and depressed mood 07/13/2015  . Chronic bilateral lower abdominal pain 05/24/2015  . Anorexia nervosa 04/29/2015  . Postural orthostatic tachycardia syndrome 09/26/2014  . Migraine without aura 10/30/2012  . Episodic tension type headache 10/30/2012  . Orthostatic hypotension 10/30/2012    Past Surgical History:  Procedure Laterality Date  . ANTERIOR CRUCIATE LIGAMENT REPAIR  03/2010  . EYE SURGERY  2000   clogged tear duct  . WISDOM TOOTH EXTRACTION      OB History    No data available       Home Medications    Prior to Admission medications   Medication Sig Start Date End Date Taking? Authorizing Provider  atenolol (TENORMIN) 25 MG tablet Take 0.5 tablets (12.5 mg total) by mouth daily. 07/27/15   Tyrone Nine, MD  bifidobacterium infantis (ALIGN) capsule Take 1 capsule by mouth daily.    [provider]  fludrocortisone (FLORINEF) 0.1 MG tablet Take 1 tablet by mouth daily 04/13/15   Owens Shark, MD  FLUoxetine (PROZAC) 40 MG capsule TAKE 1 CAPSULE (40 MG TOTAL) BY MOUTH DAILY. 12/04/16   Millican, Neysa Bonito, NP  fluticasone (FLONASE) 50 MCG/ACT nasal spray Place 2 sprays into both nostrils daily. 11/16/15   Christianne Dolin, NP  ibuprofen (ADVIL,MOTRIN) 800 MG tablet Take 1 tablet (800 mg total) by mouth every 8 (eight) hours as needed for mild pain or moderate pain. 12/10/15   Trixie Dredge, PA-C  JUNEL  1.5/30 1.5-30 MG-MCG tablet Take 1 tablet by mouth daily. 08/29/16   [name], [name], PA-C  Multiple Vitamin (MULTIVITAMIN WITH MINERALS) TABS tablet Take 1 tablet by mouth daily.    [provider]  OLANZapine (ZYPREXA) 2.5 MG tablet TAKE 1/2 TABLET BY MOUTH AT BEDTIME. 12/11/16   Verneda SkillHacker, Caroline T, FNP  pantoprazole (PROTONIX) 40 MG tablet  08/15/16   [provider]  QUDEXY XR 150 MG CS24 Take 1  capsule at bedtime 10/11/16   Elveria RisingGoodpasture, Tina, NP  SUMAtriptan (IMITREX) 25 MG tablet Take 25 mg by mouth every 2 (two) hours as needed for migraine. May repeat in 2 hours if headache persists or recurs.    [provider]    Family History Family History  Problem Relation Age of Onset  . Migraines Mother        Started 4th or 5th grade  . Seizures Mother        Febrile Seizures as a child  . Seizures Father        Febrile Seizures as a child  . Hypertension Father   . Kidney Stones Father   . Migraines Maternal Aunt   . Migraines Maternal Uncle   . Diabetes Maternal Grandmother   . Stroke Maternal Grandfather        mini-stroke  . Cancer Maternal Grandfather        Bladder cancer, Died at 5068  . Diabetes Maternal Grandfather   . Mental retardation Other        Maternal Second Cousin  . Other Other        Maternal Great Uncle had some sort of Retinal Vessel Occlusion  . Breast cancer Maternal Aunt   . Cancer Maternal Aunt        Peritoneal CA  . Eating disorder Maternal Aunt   . Depression Unknown        Father's side of the family    Social History Social History  Substance Use Topics  . Smoking status: Never Smoker  . Smokeless tobacco: Never Used  . Alcohol use 0.0 oz/week     Comment: Drinks on occasion      Allergies   Amoxicillin   Review of Systems Review of Systems  Constitutional: Positive for fever.  HENT: Negative for congestion.   Respiratory: Positive for cough. Negative for shortness of breath.   Cardiovascular: Negative for chest pain.  Gastrointestinal: Positive for abdominal pain, diarrhea, nausea and vomiting (bloody).  Genitourinary: Positive for decreased urine volume. Negative for hematuria, vaginal bleeding and vaginal discharge.  Musculoskeletal: Positive for myalgias.  All other systems reviewed and are negative.    Physical Exam Updated Vital Signs BP 104/77   Pulse (!) 121   Temp 99 F (37.2 C) (Oral)   Resp 20    Ht 5' 5.75" (1.67 m)   Wt 130 lb (59 kg)   LMP 12/14/2016   SpO2 100%   BMI 21.14 kg/m   Physical Exam  Constitutional: She is oriented to person, place, and time. She appears well-developed and well-nourished. No distress.  HENT:  Head: Normocephalic and atraumatic.  Mouth/Throat: Uvula is midline, oropharynx is clear and moist and mucous membranes are normal. No oropharyngeal exudate, posterior oropharyngeal edema, posterior oropharyngeal erythema or tonsillar abscesses. No tonsillar exudate.  Eyes: Conjunctivae and EOM are normal. Right eye exhibits no discharge. Left eye exhibits no discharge. No scleral icterus.  Neck: Normal range of motion. Neck supple.  Cardiovascular: Normal rate, regular rhythm, normal heart sounds and  intact distal pulses.   Pulmonary/Chest: Effort normal and breath sounds normal. No respiratory distress. She has no wheezes. She has no rales. She exhibits no tenderness.  Abdominal: Soft. Normal appearance and bowel sounds are normal. She exhibits no distension and no mass. There is tenderness in the right lower quadrant and periumbilical area. There is tenderness at McBurney's point. There is no rigidity, no rebound, no guarding, no CVA tenderness and negative Murphy's sign. No hernia.  Musculoskeletal: Normal range of motion. She exhibits no edema.  Neurological: She is alert and oriented to person, place, and time.  Skin: Skin is warm and dry. She is not diaphoretic.  Nursing note and vitals reviewed.    ED Treatments / Results  DIAGNOSTIC STUDIES: Oxygen Saturation is 100% on RA, NL by my interpretation.    COORDINATION OF CARE: 11:41 PM-Discussed next steps with pt. Pt verbalized understanding and is agreeable with the plan. Will order medications and imaging.   Labs (all labs ordered are listed, but only abnormal results are displayed) Labs Reviewed  CBC WITH DIFFERENTIAL/PLATELET - Abnormal; Notable for the following:       Result Value   WBC 13.3  (*)    Hemoglobin 15.6 (*)    Neutro Abs 11.8 (*)    Lymphs Abs 0.6 (*)    All other components within normal limits  COMPREHENSIVE METABOLIC PANEL - Abnormal; Notable for the following:    Glucose, Bld 104 (*)    BUN 25 (*)    Total Protein 8.4 (*)    All other components within normal limits  LIPASE, BLOOD  URINALYSIS, ROUTINE W REFLEX MICROSCOPIC  PREGNANCY, URINE    EKG  EKG Interpretation None       Radiology No results found.  Procedures Procedures (including critical care time)  Medications Ordered in ED Medications  ondansetron (ZOFRAN-ODT) disintegrating tablet 4 mg (4 mg Oral Given 12/21/16 2229)  0.9 %  sodium chloride infusion (1,000 mLs Intravenous New Bag/Given 12/21/16 2256)     Initial Impression / Assessment and Plan / ED Course  I have reviewed the triage vital signs and the nursing notes.  Pertinent labs & imaging results that were available during my care of the patient were reviewed by me and considered in my medical decision making (see chart for details).    Patient presents with abdominal pain with associated nausea, vomiting and diarrhea that started yesterday evening. Patient states she has been unable to keep any fluids down at home due to worsening symptoms. Endorses subjective fever. VSS. Exam revealed tenderness over periumbilical and right lower quadrant with McBurney's point tenderness. No CVA tenderness. Remaining exam unremarkable. Patient given IV fluids and antiemetics. Pregnancy negative. Labs revealed WBC 13.3. Due to patient with leukocytosis and abdominal tenderness concerning for appendicitis Will order CT abdomen for further evaluation.  Hand-off to Dr. Bebe Shaggy. Dispo pending CT abdomen and UA.   Final Clinical Impressions(s) / ED Diagnoses   Final diagnoses:  None    New Prescriptions New Prescriptions   No medications on file   I personally performed the services described in this documentation, which was scribed in my  presence. The recorded information has been reviewed and is accurate.    Barrett Henle, PA-C 12/22/16 0109    Zadie Rhine, MD 12/22/16 [phone]

## 2016-12-21 NOTE — ED Triage Notes (Signed)
Severe abdominal pain, body aches, vomiting and diarrhea since last night.

## 2016-12-21 NOTE — ED Notes (Signed)
C/o n/v/d onset yesterday w abd, back and hip pain  Only urinated x 1 today,  States some burning at that time,  Unable to get urine at present

## 2016-12-22 ENCOUNTER — Encounter: Payer: Self-pay | Admitting: Physician Assistant

## 2016-12-22 DIAGNOSIS — R109 Unspecified abdominal pain: Secondary | ICD-10-CM | POA: Diagnosis not present

## 2016-12-22 DIAGNOSIS — R197 Diarrhea, unspecified: Secondary | ICD-10-CM | POA: Diagnosis not present

## 2016-12-22 DIAGNOSIS — R1110 Vomiting, unspecified: Secondary | ICD-10-CM | POA: Diagnosis not present

## 2016-12-22 LAB — URINALYSIS, ROUTINE W REFLEX MICROSCOPIC
BILIRUBIN URINE: NEGATIVE
Glucose, UA: NEGATIVE mg/dL
Leukocytes, UA: NEGATIVE
Nitrite: NEGATIVE
PROTEIN: 30 mg/dL — AB
Specific Gravity, Urine: >1.046 — ABNORMAL HIGH (ref 1.005–1.030)
pH: 5.5 (ref 5.0–8.0)

## 2016-12-22 LAB — URINALYSIS, MICROSCOPIC (REFLEX)

## 2016-12-22 LAB — HCG, SERUM, QUALITATIVE: Preg, Serum: NEGATIVE

## 2016-12-22 MED ORDER — ONDANSETRON 8 MG PO TBDP: 8.0000 mg | ORAL_TABLET | Freq: Three times a day (TID) | ORAL | 0 refills | Status: DC | PRN

## 2016-12-22 MED ORDER — IOPAMIDOL (ISOVUE-300) INJECTION 61%
100.0000 mL | Freq: Once | INTRAVENOUS | Status: AC | PRN
  Administered 2016-12-22: 100 mL via INTRAVENOUS

## 2016-12-22 NOTE — ED Notes (Signed)
Pt has had no vomiting or diarrhea since arriving to ed.

## 2016-12-22 NOTE — Discharge Instructions (Signed)
°  SEEK IMMEDIATE MEDICAL ATTENTION IF: °The pain does not go away or becomes severe, particularly over the next 8-12 hours.  °A temperature above 100.4F develops.  °Repeated vomiting occurs (multiple episodes).  ° °Blood is being passed in stools or vomit (bright red or black tarry stools).  °Return also if you develop chest pain, difficulty breathing, dizziness or fainting, or become confused, poorly responsive, or inconsolable. ° °

## 2016-12-22 NOTE — ED Notes (Signed)
Patient transported to CT 

## 2016-12-27 ENCOUNTER — Telehealth: Payer: Self-pay | Admitting: *Deleted

## 2016-12-27 DIAGNOSIS — F419 Anxiety disorder, unspecified: Secondary | ICD-10-CM | POA: Diagnosis not present

## 2016-12-27 NOTE — Telephone Encounter (Signed)
Therapist called to request I call [facility] due to concerns of N/V and concern of potential relapse of eating disorder  Spoke with Masaye: has ben admitted to ED for dehydration due to vomiting.  Last vomiting was Friday, but never nauseated and bad GI pain.  Is starting to incorporate more bland foods.  Yesterday had frozen waffle, PB crackers, fruit, oatmeal, rice.  Drinking sprite, gatorade.  Says Zofran ran out and took sister's phenergan which worked better  I suggested asking PCP for RX for phenergan and eating 6 small bland snack/day with plenty of fluids.  Will see her next week

## 2016-12-28 MED ORDER — PROMETHAZINE HCL 25 MG PO TABS: 25.0000 mg | ORAL_TABLET | Freq: Three times a day (TID) | ORAL | 0 refills | Status: DC | PRN

## 2016-12-28 NOTE — Telephone Encounter (Signed)
Patient notified via My Chart.  Meds ordered this encounter  Medications  . promethazine (PHENERGAN) 25 MG tablet    Sig: Take 1 tablet (25 mg total) by mouth every 8 (eight) hours as needed for nausea or vomiting.    Dispense:  20 tablet    Refill:  0    Order Specific Question:   Supervising Provider    Answer:   Clelia CroftSHAW, EVA N [[id number]]

## 2016-12-28 NOTE — Addendum Note (Signed)
Addended by: [name], Kendallyn Lippold on: 12/28/2016 09:04 AM   Modules accepted: Orders

## 2017-01-02 ENCOUNTER — Ambulatory Visit: Payer: BLUE CROSS/BLUE SHIELD | Admitting: *Deleted

## 2017-01-02 ENCOUNTER — Encounter: Payer: BLUE CROSS/BLUE SHIELD | Attending: Pediatrics | Admitting: *Deleted

## 2017-01-02 DIAGNOSIS — F509 Eating disorder, unspecified: Secondary | ICD-10-CM | POA: Diagnosis not present

## 2017-01-02 DIAGNOSIS — Z713 Dietary counseling and surveillance: Secondary | ICD-10-CM | POA: Insufficient documentation

## 2017-01-02 NOTE — Progress Notes (Signed)
Appointment start time: 1400 Appointment end time: 1500  Patient was seen on 01/02/17 for nutrition counseling pertaining to disordered eating.  She is accompanied by her boyfriend  Primary care provider: Porfirio Oarhelle [name] Therapist: Noni SaupeHeather Kitchen weekly Any other medical team members: Dr. Karlene EinsteinPerry/Adolescent medicine NPs Parents: Bonnita LevanJoy Thomas  Assessment Stomach is improving.  It's better than it was , but still some discomfort.  Bloating, gassy, churning. Volunteers.  Takes phenergran sometimes (was prescribed every 8 hours). zofran once since last week.   No vomiting in a week.  Has been eating mostly bland foods.   Boyfriend and mom are here to report she is not eating enough.   Naps more often Constipated.  More diarrhea than actual poop Poor energy Doesn't sleep well due to GI distress  Headaches not too bad Reflux was getting better, but worsened after stomach bug Dizziness daily  Boyfriend reports increased body image concerns.  Weight is down  Physical activity: none currently.  Plans to start back walking dog    Growth Metrics: Ideal BMI for age: 5021.6 BMI current: 21 % Ideal:  100% Previous growth data: weight/age  101-90th%; height/age at 75%; BMI/age 62-85% Goal BMI range based on growth chart data: 22-25 Goal weight range based on growth chart data: 130-145 lb No further weight restoration needed.  However, she is still restricting/appetite suppressed with Topomax.  When she eats intuitively, she will most likely gain a little more weight   Mental health diagnosis: AN, B/P subtype   Dietary assessment: mom says she's always been picky; currently following gluten-free diet  24 hour recall B: honey nut chex (small bowl) S: handful bbq fritos L: zucchini lentil pasta with alfredo sauce (very small amount) with zucchini and squash D: rotissserie chicken, rice, salad, corn, roll S: hot fudge sundae Beverages: 16 oz water  Today so far B: 2 waffles with nutella S:  strawberry ice cream L: didn't finish frozen meal (lean cusine)  Beverages: about the same as yesterday   Physical activity: none.     Estimated energy intake: 1200 kcal  Estimated energy needs: 2200 kcal for weight maintenance 275 g CHO 110 g pro 73 g fat  Nutrition Diagnosis: NI-1.4 Inadequate energy intake As related to restricting and purging.  As evidenced by dietary recall  Intervention/Goals:  Nutrition counseling provided.  Clotee appears to be relapsing.  [facility]  Challenged ED.  Encouraged and empowered her to fight back  1.  Start phenergan every night. If symptoms don't improve, every 8 hours as prescribed by PCP 2.  64 oz fluid daily.  At least 24 oz electrolyte beverage 3.  6 small meals each day.  Each one needs 30 g carbohydrate, 14 g protein, 10 g fat  Monitoring and Evaluation: Patient will follow up in 2 weeks.

## 2017-01-02 NOTE — Patient Instructions (Addendum)
1.  Start phenergan every night 2.  64 oz fluid daily.  At least 24 oz electrolyte beverage 3.  6 small meals each day.  Each one needs 30 g carbohydrate, 14 g protein, 10 g fat

## 2017-01-03 ENCOUNTER — Other Ambulatory Visit: Payer: Self-pay | Admitting: Physician Assistant

## 2017-01-03 DIAGNOSIS — F419 Anxiety disorder, unspecified: Secondary | ICD-10-CM | POA: Diagnosis not present

## 2017-01-06 ENCOUNTER — Other Ambulatory Visit: Payer: Self-pay | Admitting: Family

## 2017-01-10 DIAGNOSIS — D2271 Melanocytic nevi of right lower limb, including hip: Secondary | ICD-10-CM | POA: Diagnosis not present

## 2017-01-10 DIAGNOSIS — F419 Anxiety disorder, unspecified: Secondary | ICD-10-CM | POA: Diagnosis not present

## 2017-01-10 DIAGNOSIS — D485 Neoplasm of uncertain behavior of skin: Secondary | ICD-10-CM | POA: Diagnosis not present

## 2017-01-17 ENCOUNTER — Encounter: Payer: BLUE CROSS/BLUE SHIELD | Admitting: *Deleted

## 2017-01-17 ENCOUNTER — Ambulatory Visit (INDEPENDENT_AMBULATORY_CARE_PROVIDER_SITE_OTHER): Payer: BLUE CROSS/BLUE SHIELD | Admitting: Family

## 2017-01-17 ENCOUNTER — Encounter: Payer: Self-pay | Admitting: Family

## 2017-01-17 VITALS — BP 100/66 | HR 74 | Ht 65.75 in | Wt 129.6 lb

## 2017-01-17 DIAGNOSIS — F4323 Adjustment disorder with mixed anxiety and depressed mood: Secondary | ICD-10-CM | POA: Diagnosis not present

## 2017-01-17 DIAGNOSIS — F5000 Anorexia nervosa, unspecified: Secondary | ICD-10-CM

## 2017-01-17 DIAGNOSIS — F419 Anxiety disorder, unspecified: Secondary | ICD-10-CM | POA: Diagnosis not present

## 2017-01-17 DIAGNOSIS — G90A Postural orthostatic tachycardia syndrome (POTS): Secondary | ICD-10-CM

## 2017-01-17 DIAGNOSIS — R000 Tachycardia, unspecified: Secondary | ICD-10-CM

## 2017-01-17 DIAGNOSIS — I951 Orthostatic hypotension: Secondary | ICD-10-CM

## 2017-01-17 DIAGNOSIS — F509 Eating disorder, unspecified: Secondary | ICD-10-CM | POA: Diagnosis not present

## 2017-01-17 DIAGNOSIS — Z713 Dietary counseling and surveillance: Secondary | ICD-10-CM | POA: Diagnosis not present

## 2017-01-17 MED ORDER — ONDANSETRON 8 MG PO TBDP: 8.0000 mg | ORAL_TABLET | Freq: Three times a day (TID) | ORAL | 0 refills | Status: DC | PRN

## 2017-01-17 MED ORDER — SUMATRIPTAN SUCCINATE 25 MG PO TABS: 25.0000 mg | ORAL_TABLET | ORAL | 3 refills | Status: DC | PRN

## 2017-01-17 NOTE — Progress Notes (Signed)
Appointment start time: 1430 Appointment end time: 1530  Patient was seen on 01/17/17 for nutrition counseling pertaining to disordered eating.  She is accompanied by her boyfriend  Primary care provider: Porfirio Oarhelle [name] Therapist: Noni SaupeHeather Kitchen weekly Any other medical team members: Dr. 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  About the same Dad says she sleeps too much.  Sleeps fine at night, but is tired during the day.  Comes home from work tired.  Dad gives her a hard time about sleeping.  She thinks she is sleeping too much, but she is just tired Stopped taking vitamins.  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  Realizes it's worse than when she was eating and drinking more.  Still constipated Headaches worse daily Reflux not so bad anymore  Physical activity: nothing structured.  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 CIB L: salsarita' chicken burrito bowl (chicken, rice, black beans, pica, sour cream, cheese) almost finished it, but also had chips and queso.  Sprite S: black and tan latte.  Didn't eat her muffin D: chicken, corn, squash, zucchini S: nothing  B: 2 waffles with nutella. Instant cappucino L: leftover mexican: chicken enchilada with rice (didn't eat it the  day before) S: skipped her snack D: chicken fried rice, lettuce wraps S: frozen custard   Estimated energy intake: 1200 kcal  Estimated energy needs: 2200 kcal for weight maintenance 275 g CHO 110 g pro 73 g fat  Nutrition Diagnosis: NI-1.4 Inadequate energy intake As related to restricting and purging.  As evidenced by dietary recall  Intervention/Goals:  Nutrition counseling provided.    64 oz fluid daily.  At least 24 oz electrolyte beverage Get back on old meal plan- 5 eating excursions.   breakfast needs 2 items, snacks need 2 macronutrients  (carb and protein or fat)  Monitoring and Evaluation: Patient will follow up in 2 weeks.

## 2017-01-17 NOTE — Progress Notes (Signed)
THIS RECORD MAY CONTAIN CONFIDENTIAL INFORMATION THAT SHOULD NOT BE RELEASED WITHOUT REVIEW OF THE SERVICE PROVIDER.  Adolescent Medicine Consultation Follow-Up Visit Jessica [name]  is a 21 y.o. female referred by Porfirio Oar, PA-C here today for follow-up regarding AN, adjustment disorder with mixed anxiety/depressed mood. POTS  Last seen in Adolescent Medicine Clinic on 12/04/16 for same.  Plan at last visit included continue prozac 40 mg, Zyprexa 1.25 mg   Pertinent Labs? No Growth Chart Viewed? no   History was provided by the patient.  Interpreter? no  PCP Confirmed?  yes  My Chart Activated?   yes    Chief Complaint  Patient presents with  . SAME DAY    HPI:   -presents with BF today.  -Thought dizziness was better; has noticed that walking around she will notice she is very lightheaded.  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  -drinking propel -skipping meals? Sometimes will miss a snack; the guy has a colostomy and that makes it difficult to eat doing that care for him every 20 minutes. Would push lunch back 20-30 minutes.  -exercise: none  -medication missed doses: sometimes doesn't take zyprexa.  -dad continues to make negative comments regarding her napping or his food intake -she is safe at home  Review of Systems  Constitutional: Negative for malaise/fatigue.  Eyes: Negative for double vision.  Respiratory: Negative for shortness of breath.   Cardiovascular: Negative for chest pain and palpitations.  Gastrointestinal: Negative for abdominal pain, constipation, diarrhea, nausea and vomiting.  Genitourinary: Negative for dysuria.  Musculoskeletal: Negative for joint pain and myalgias.  Skin: Negative for rash.  Neurological: Positive for dizziness. Negative for sensory change, focal  weakness, loss of consciousness and headaches.  Endo/Heme/Allergies: Does not bruise/bleed easily.   PHQ-SADS SCORE ONLY 01/17/2017 10/25/2016  PHQ-15 11 8   GAD-7 3 2   PHQ-9 9 7   Suicidal Ideation No No  Comment somewhat difficult somewhat difficult   PHQ-SADS SCORE ONLY 09/22/2016 08/16/2016  PHQ-15 10 11   GAD-7 2 5   PHQ-9 8 6   Suicidal Ideation No No  Comment somewhat difficult  somewhat difficult     No LMP recorded. Allergies  Allergen Reactions  . Amoxicillin Rash   Outpatient Medications Prior to Visit  Medication Sig Dispense Refill  . atenolol (TENORMIN) 25 MG tablet Take 0.5 tablets (12.5 mg total) by mouth daily.    . fludrocortisone (FLORINEF) 0.1 MG tablet Take 1 tablet by mouth daily 90 tablet 5  . FLUoxetine (PROZAC) 40 MG capsule TAKE 1 CAPSULE (40 MG TOTAL) BY MOUTH DAILY. 30 capsule 3  . fluticasone (FLONASE) 50 MCG/ACT nasal spray Place 2 sprays into both nostrils daily. 16 g 12  . ibuprofen (ADVIL,MOTRIN) 800 MG tablet Take 1 tablet (800 mg total) by mouth every 8 (eight) hours as needed for mild pain or moderate pain. 15 tablet 0  . JUNEL 1.5/30 1.5-30 MG-MCG tablet Take 1 tablet by mouth daily. 3 Package 3  . OLANZapine (ZYPREXA) 2.5 MG tablet TAKE 1/2 TABLET BY MOUTH AT BEDTIME. 30 tablet 1  . OLANZapine (ZYPREXA) 2.5 MG tablet TAKE 1/2 TABLET BY MOUTH AT BEDTIME. 30 tablet 2  . ondansetron (ZOFRAN ODT) 8 MG disintegrating tablet Take 1 tablet (8 mg total) by mouth every 8 (eight) hours as needed. 4 tablet 0  .  pantoprazole (PROTONIX) 40 MG tablet     . promethazine (PHENERGAN) 25 MG tablet TAKE 1 TABLET BY MOUTH EVERY 8 HOURS AS NEEDED FOR NAUSEA OR VOMITING. 20 tablet 0  . QUDEXY XR 150 MG CS24 Take 1 capsule at bedtime 30 each 5  . SUMAtriptan (IMITREX) 25 MG tablet Take 25 mg by mouth every 2 (two) hours as needed for migraine. May repeat in 2 hours if headache persists or recurs.    . bifidobacterium infantis (ALIGN) capsule Take 1 capsule by mouth daily.     . Multiple Vitamin (MULTIVITAMIN WITH MINERALS) TABS tablet Take 1 tablet by mouth daily.     No facility-administered medications prior to visit.      Patient Active Problem List   Diagnosis Date Noted  . Hematuria 03/22/2016  . Adjustment disorder with mixed anxiety and depressed mood 07/13/2015  . Chronic bilateral lower abdominal pain 05/24/2015  . Anorexia nervosa 04/29/2015  . Postural orthostatic tachycardia syndrome 09/26/2014  . Migraine without aura 10/30/2012  . Episodic tension type headache 10/30/2012  . Orthostatic hypotension 10/30/2012    The following portions of the patient's history were reviewed and updated as appropriate: allergies, current medications, past medical history and problem list.  Physical Exam:  Vitals:   01/17/17 1557  BP: 100/66  Pulse: 74  Weight: 129 lb 9.6 oz (58.8 kg)  Height: 5' 5.75" (1.67 m)   BP 100/66 (BP Location: Right Arm, Patient Position: Sitting, Cuff Size: Normal)   Pulse 74   Ht 5' 5.75" (1.67 m)   Wt 129 lb 9.6 oz (58.8 kg)   BMI 21.08 kg/m  Body mass index: body mass index is 21.08 kg/m. Growth percentile SmartLinks can only be used for patients less than 60 years old.  Wt Readings from Last 3 Encounters:  01/17/17 129 lb 9.6 oz (58.8 kg)  01/02/17 129 lb 9.6 oz (58.8 kg)  12/21/16 130 lb (59 kg)   BP Readings from Last 3 Encounters:  01/17/17 100/66  12/22/16 (!) 87/43  12/04/16 106/70   Physical Exam  Constitutional: She is oriented to person, place, and time. She appears well-developed. No distress.  HENT:  Head: Normocephalic and atraumatic.  Eyes: EOM are normal. Pupils are equal, round, and reactive to light. No scleral icterus.  Neck: Normal range of motion. Neck supple. No thyromegaly present.  Cardiovascular: Normal rate, regular rhythm, normal heart sounds and intact distal pulses.   No murmur heard. Pulmonary/Chest: Effort normal and breath sounds normal.  Abdominal: Soft.  Musculoskeletal:  Normal range of motion. She exhibits no edema.  Lymphadenopathy:    She has no cervical adenopathy.  Neurological: She is alert and oriented to person, place, and time. No cranial nerve deficit.  Skin: Skin is warm and dry. No rash noted.  Psychiatric: She has a normal mood and affect.   Assessment/Plan: 1. Adjustment disorder with mixed anxiety and depressed mood -continue with prozac 40 mg  -discussed coping strategies for relationship with dad  -continue therapy   2. 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  3. Postural orthostatic tachycardia syndrome -Propel and snacks as above -keep scheduled appts    BH screenings: PHQSADS reviewed and indicated increased depressive symptoms, . Screens discussed with patient and parent and adjustments to plan made accordingly.   Follow-up:  Return in about 3 weeks (around 02/07/2017) for with Christianne Dolinhristy Millican, FNP-C, [place] management.   Medical decision-making:  >25 minutes spent face to face with patient with more than 50% of appointment spent discussing diagnosis, management, follow-up, and reviewing of symptom management and appropriate fluid intake. I feel that she is anxious about transitioning care from our clinic in November. However, she has been referred appropriately into PCP care and can be adequately managed there. Will continue to work on this transition.

## 2017-01-17 NOTE — Patient Instructions (Signed)
Take Zyprexa every night.  I'll see you back in 3 weeks.  Try drinking Propel during your morning water bottle fill.  Protein rich snacks in AM.

## 2017-01-17 NOTE — Patient Instructions (Signed)
64 oz fluid daily.  At least 24 oz electrolyte beverage Get back on old meal plan- 5 eating excursions.   breakfast needs 2 items, snacks need 2 macronutrients  (carb and protein or fat)

## 2017-01-26 ENCOUNTER — Ambulatory Visit (INDEPENDENT_AMBULATORY_CARE_PROVIDER_SITE_OTHER): Payer: BLUE CROSS/BLUE SHIELD | Admitting: Physician Assistant

## 2017-01-26 ENCOUNTER — Encounter: Payer: Self-pay | Admitting: Physician Assistant

## 2017-01-26 VITALS — BP 101/65 | HR 84 | Ht 65.75 in | Wt 131.0 lb

## 2017-01-26 DIAGNOSIS — Z111 Encounter for screening for respiratory tuberculosis: Secondary | ICD-10-CM | POA: Diagnosis not present

## 2017-01-26 NOTE — Progress Notes (Signed)

## 2017-01-26 NOTE — Progress Notes (Signed)
Marland KitchenCiara J Thomas  MRN: [id number] DOB: 1996-02-25  PCP: [name], [name], PA-C  Subjective:  Pt is a pleasant 21 year old female who presents to clinic for TB gold test. 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 Screening questionnaire completed. She has had one TB test before - negative.  Denies cough, fever, chills, night sweats, weight loss.   Review of Systems  Constitutional: Negative for chills, diaphoresis, fatigue, fever and unexpected weight change.  HENT: Negative for congestion, postnasal drip, rhinorrhea, sinus pain, sinus pressure and sneezing.   Respiratory: Negative for cough, chest tightness, shortness of breath and wheezing.   Psychiatric/Behavioral: Negative for sleep disturbance.    Patient Active Problem List   Diagnosis Date Noted  . Hematuria 03/22/2016  . Adjustment disorder with mixed anxiety and depressed mood 07/13/2015  . Chronic bilateral lower abdominal pain 05/24/2015  . Anorexia nervosa 04/29/2015  . Postural orthostatic tachycardia syndrome 09/26/2014  . Migraine without aura 10/30/2012  . Episodic tension type headache 10/30/2012  . Orthostatic hypotension 10/30/2012    Current Outpatient Prescriptions on File Prior to Visit  Medication Sig Dispense Refill  . atenolol (TENORMIN) 25 MG tablet Take 0.5 tablets (12.5 mg total) by mouth daily.    . fludrocortisone (FLORINEF) 0.1 MG tablet Take 1 tablet by mouth daily 90 tablet 5  . FLUoxetine (PROZAC) 40 MG capsule TAKE 1 CAPSULE (40 MG TOTAL) BY MOUTH DAILY. 30 capsule 3  . fluticasone (FLONASE) 50 MCG/ACT nasal spray Place 2 sprays into both nostrils daily. 16 g 12  . ibuprofen (ADVIL,MOTRIN) 800 MG tablet Take 1 tablet (800 mg total) by mouth every 8 (eight) hours as needed for mild pain or moderate pain. 15 tablet 0  . JUNEL 1.5/30 1.5-30 MG-MCG tablet Take 1 tablet by mouth daily. 3 Package 3  . OLANZapine (ZYPREXA) 2.5 MG tablet TAKE 1/2 TABLET BY MOUTH AT  BEDTIME. 30 tablet 1  . ondansetron (ZOFRAN ODT) 8 MG disintegrating tablet Take 1 tablet (8 mg total) by mouth every 8 (eight) hours as needed. 4 tablet 0  . pantoprazole (PROTONIX) 40 MG tablet     . promethazine (PHENERGAN) 25 MG tablet TAKE 1 TABLET BY MOUTH EVERY 8 HOURS AS NEEDED FOR NAUSEA OR VOMITING. 20 tablet 0  . QUDEXY XR 150 MG CS24 Take 1 capsule at bedtime 30 each 5  . SUMAtriptan (IMITREX) 25 MG tablet Take 1 tablet (25 mg total) by mouth every 2 (two) hours as needed for migraine. May repeat in 2 hours if headache persists or recurs. 10 tablet 3   No current facility-administered medications on file prior to visit.     Allergies  Allergen Reactions  . Amoxicillin Rash     Objective:  BP 101/65 (BP Location: Right Arm, Patient Position: Sitting, Cuff Size: Normal)   Pulse 84   Ht 5' 5.75" (1.67 m)   Wt 131 lb (59.4 kg)   BMI 21.31 kg/m   Physical Exam  Constitutional: She is oriented to person, place, and time and well-developed, well-nourished, and in no distress. No distress.  Cardiovascular: Normal rate, regular rhythm and normal heart sounds.   Pulmonary/Chest: Effort normal and breath sounds normal. She has no wheezes. She has no rales.  Neurological: She is alert and oriented to person, place, and time. GCS score is 15.  Skin: Skin is warm and dry.  Psychiatric: Mood, memory, affect and judgment normal.  Vitals reviewed.   Assessment and Plan :  1. 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 Lab is pending, will contact with results.   Marco Collie, PA-C  Primary Care at [facility] 01/26/2017 2:19 PM

## 2017-01-30 LAB — QUANTIFERON TB GOLD ASSAY (BLOOD)

## 2017-01-30 LAB — QUANTIFERON IN TUBE
QFT TB AG MINUS NIL VALUE: <0 IU/mL
QUANTIFERON MITOGEN VALUE: 7.23 IU/mL
QUANTIFERON TB AG VALUE: 0.03 IU/mL
QUANTIFERON TB GOLD: NEGATIVE
Quantiferon Nil Value: 0.07 [IU]/mL

## 2017-02-01 ENCOUNTER — Encounter: Payer: BLUE CROSS/BLUE SHIELD | Attending: Pediatrics | Admitting: *Deleted

## 2017-02-01 DIAGNOSIS — F419 Anxiety disorder, unspecified: Secondary | ICD-10-CM | POA: Diagnosis not present

## 2017-02-01 DIAGNOSIS — F509 Eating disorder, unspecified: Secondary | ICD-10-CM | POA: Diagnosis not present

## 2017-02-01 DIAGNOSIS — F5000 Anorexia nervosa, unspecified: Secondary | ICD-10-CM

## 2017-02-01 DIAGNOSIS — Z713 Dietary counseling and surveillance: Secondary | ICD-10-CM | POA: Insufficient documentation

## 2017-02-01 NOTE — Patient Instructions (Addendum)
http://[email or web address]/https://[email or web address]-[email or web address]/[email or web address]/Body-Affirming-Children-Books-That-Teach-Kids-to-Trust-Celebrate-Their-Bodies  For Tresa EndoKelly  --- go to [employer] and click on the blog section.  Scroll to the Body Affirming Children's Books   Celebrate Your Body by Elane FritzSonya Renee Taylor  [email or web address] for medical care  Body Positive Power by Pricilla LarssonMegan Jayne Crabbe Embody by Ann Heldonnie Sobczak   Instagram BodyPosiPanda The Ileene RubensMilitant Baker Eff Your [facility] The VintageBeauty Standards Food Peace Dietitian With This Body MarciRD BeautyRedefined

## 2017-02-01 NOTE — Progress Notes (Signed)
Appointment start time: 1530 Appointment end time: 1630  Patient was seen on 02/01/17 for nutrition counseling pertaining to disordered eating.  She is accompanied by her boyfriend  Primary care provider: Porfirio Oarhelle [name] Therapist: Noni SaupeHeather Kitchen weekly Any other medical team members: Dr. Karlene EinsteinPerry/Adolescent medicine NPs Parents: Bonnita LevanJoy Thomas  Assessment Hospice patient died.  Has interview with [employer] for job Is going to [place] and excited Has been very busy.  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   This is really triggering for her  Not sleeping well at night.  Very tired.  No time to take naps. No trouble falling asleep, but wakes up multiple times through the night  Anxiety is not great right now.  Has been worse   Some dizziness, somewhat improved Reflux ok Increased stomach pain- feels like gas in her lower abdomen.  No associated with eating.  Ate gluten once, but it was hurting before  Looking for new PCP.     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   Growth Metrics: Current weight : 129.6; BMI: 21.08  Previous growth data: weight/age  56-90th%; height/age at 75%; BMI/age 74-85% Goal BMI range based on growth chart data: 22-25 Goal weight range based on growth chart data: 130-145 lb   Mental health diagnosis: AN, B/P subtype   Dietary assessment: mom says she's always been picky; currently following gluten-free diet  24 hour recall B: 2 waffles with nutella Peach [employer] [place] bar L: burrito (frozen) D: chicken fried rice (larger portion) S: cheesecake Beverages: adequate water  Today B: waffles with nutella Polar peach smoothie with protein Chicken fried rice  Estimated energy intake: 1400 kcal  Estimated energy needs: 2200 kcal for weight maintenance 275 g CHO 110 g pro 73 g fat  Nutrition Diagnosis: NI-1.4 Inadequate energy intake As related to  restricting and purging.  As evidenced by dietary recall  Intervention/Goals:  Nutrition counseling provided.  Discussed ways to talk to the mom about her dieting daughters so that Darrel ReachCiarra is safe Discussed PCP options ([facility] in [place]) Discussed resources for body image on social media and books    Monitoring and Evaluation: Patient will follow up in 2-3 weeks.

## 2017-02-07 ENCOUNTER — Encounter: Payer: Self-pay | Admitting: Family

## 2017-02-07 ENCOUNTER — Ambulatory Visit (INDEPENDENT_AMBULATORY_CARE_PROVIDER_SITE_OTHER): Payer: BLUE CROSS/BLUE SHIELD | Admitting: Family

## 2017-02-07 VITALS — BP 101/62 | HR 77 | Ht 65.35 in | Wt 132.4 lb

## 2017-02-07 DIAGNOSIS — F5000 Anorexia nervosa, unspecified: Secondary | ICD-10-CM

## 2017-02-07 DIAGNOSIS — F4323 Adjustment disorder with mixed anxiety and depressed mood: Secondary | ICD-10-CM

## 2017-02-07 DIAGNOSIS — F419 Anxiety disorder, unspecified: Secondary | ICD-10-CM | POA: Diagnosis not present

## 2017-02-07 NOTE — Progress Notes (Signed)
THIS RECORD MAY CONTAIN CONFIDENTIAL INFORMATION THAT SHOULD NOT BE RELEASED WITHOUT REVIEW OF THE SERVICE PROVIDER.  Adolescent Medicine Consultation Follow-Up Visit Jessica [name]  is a 21 y.o. female referred by Porfirio Oar, PA-C here today for follow-up regarding AN.    Last seen in Adolescent Medicine Clinic on 01/17/17 for adjustment disorder with mixed anxiety/depressed mood, AN, POTS.  Plan at last visit included continue with prozac 40mg , discussed coping strategies for relationship with dad, continue therapy, advised to add protein snack in morning and propel in AM.  Pertinent Labs? No Growth Chart Viewed? yes   History was provided by the patient.  Interpreter? no  PCP Confirmed?  yes  My Chart Activated?   yes    Chief Complaint  Patient presents with  . Follow-up    HPI:   Overall things going well.  Returning from [place] with BF and had a fun trip.  Interview Friday with med-surg oncology unit [facility].  Dad has been traveling a lot so not as many triggering encounters.  No missed med doses.     Review of Systems  Constitutional: Negative for malaise/fatigue.  Eyes: Negative for double vision.  Respiratory: Negative for shortness of breath.   Cardiovascular: Negative for chest pain and palpitations.  Gastrointestinal: Negative for abdominal pain, constipation, diarrhea, nausea and vomiting.  Genitourinary: Negative for dysuria.  Musculoskeletal: Negative for joint pain and myalgias.  Skin: Negative for rash.  Neurological: Negative for dizziness and headaches.  Endo/Heme/Allergies: Does not bruise/bleed easily.   Growth Metrics: Current weight : 129.6; BMI: 21.08      Previous growth data: weight/age  21-90th%; height/age at 75%; BMI/age 43-85% Goal BMI range based on growth chart data: 22-25 Goal weight range based on growth chart data: 130-145 lb  Patient's last menstrual period was 02/06/2017 (exact date). Allergies  Allergen  Reactions  . Amoxicillin Rash   Outpatient Medications Prior to Visit  Medication Sig Dispense Refill  . atenolol (TENORMIN) 25 MG tablet Take 0.5 tablets (12.5 mg total) by mouth daily.    . fludrocortisone (FLORINEF) 0.1 MG tablet Take 1 tablet by mouth daily 90 tablet 5  . FLUoxetine (PROZAC) 40 MG capsule TAKE 1 CAPSULE (40 MG TOTAL) BY MOUTH DAILY. 30 capsule 3  . fluticasone (FLONASE) 50 MCG/ACT nasal spray Place 2 sprays into both nostrils daily. 16 g 12  . ibuprofen (ADVIL,MOTRIN) 800 MG tablet Take 1 tablet (800 mg total) by mouth every 8 (eight) hours as needed for mild pain or moderate pain. 15 tablet 0  . JUNEL 1.5/30 1.5-30 MG-MCG tablet Take 1 tablet by mouth daily. 3 Package 3  . OLANZapine (ZYPREXA) 2.5 MG tablet TAKE 1/2 TABLET BY MOUTH AT BEDTIME. 30 tablet 1  . ondansetron (ZOFRAN ODT) 8 MG disintegrating tablet Take 1 tablet (8 mg total) by mouth every 8 (eight) hours as needed. 4 tablet 0  . pantoprazole (PROTONIX) 40 MG tablet     . promethazine (PHENERGAN) 25 MG tablet TAKE 1 TABLET BY MOUTH EVERY 8 HOURS AS NEEDED FOR NAUSEA OR VOMITING. 20 tablet 0  . QUDEXY XR 150 MG CS24 Take 1 capsule at bedtime 30 each 5  . SUMAtriptan (IMITREX) 25 MG tablet Take 1 tablet (25 mg total) by mouth every 2 (two) hours as needed for migraine. May repeat in 2 hours if headache persists or recurs. 10 tablet 3   No facility-administered medications prior to visit.      Patient Active Problem List  Diagnosis Date Noted  . Hematuria 03/22/2016  . Adjustment disorder with mixed anxiety and depressed mood 07/13/2015  . Chronic bilateral lower abdominal pain 05/24/2015  . Anorexia nervosa 04/29/2015  . Postural orthostatic tachycardia syndrome 09/26/2014  . Migraine without aura 10/30/2012  . Episodic tension type headache 10/30/2012  . Orthostatic hypotension 10/30/2012    The following portions of the patient's history were reviewed and updated as appropriate: allergies, current  medications, past medical history, past social history and problem list.  Physical Exam:  Vitals:   02/07/17 1410  BP: 101/62  Pulse: 77  Weight: 132 lb 6.4 oz (60.1 kg)  Height: 5' 5.35" (1.66 m)   BP 101/62 (BP Location: Right Arm, Patient Position: Sitting, Cuff Size: Normal)   Pulse 77   Ht 5' 5.35" (1.66 m)   Wt 132 lb 6.4 oz (60.1 kg)   LMP 02/06/2017 (Exact Date)   BMI 21.79 kg/m  Body mass index: body mass index is 21.79 kg/m. Growth percentile SmartLinks can only be used for patients less than 22 years old.  Wt Readings from Last 3 Encounters:  02/07/17 132 lb 6.4 oz (60.1 kg)  01/26/17 131 lb (59.4 kg)  01/17/17 129 lb 9.6 oz (58.8 kg)    Physical Exam  Constitutional: She is oriented to person, place, and time. She appears well-developed. No distress.  HENT:  Head: Normocephalic and atraumatic.  Eyes: Pupils are equal, round, and reactive to light. EOM are normal. No scleral icterus.  Neck: Normal range of motion. Neck supple. No thyromegaly present.  Cardiovascular: Normal rate, regular rhythm, normal heart sounds and intact distal pulses.   No murmur heard. Pulmonary/Chest: Effort normal and breath sounds normal.  Abdominal: Soft.  Musculoskeletal: Normal range of motion. She exhibits no edema.  Lymphadenopathy:    She has no cervical adenopathy.  Neurological: She is alert and oriented to person, place, and time. No cranial nerve deficit.  Skin: Skin is warm and dry. No rash noted.  Psychiatric: She has a normal mood and affect. Her behavior is normal. Judgment and thought content normal.   Assessment/Plan: 1. Anorexia nervosa -she is stable, restored.  -appears to be less triggered today -advised to continue meds as prescribed, treatment team   2. Adjustment disorder with mixed anxiety and depressed mood -as above   Follow-up:  Return in about 2 months (around 04/10/2017) for with Christianne Dolinhristy Millican, FNP-C, [place] management.   Medical decision-making:   >25 minutes spent face to face with patient with more than 50% of appointment spent discussing diagnosis, management, follow-up, and reviewing of AN and current state of recovery.

## 2017-02-22 ENCOUNTER — Ambulatory Visit: Payer: BLUE CROSS/BLUE SHIELD | Admitting: *Deleted

## 2017-02-26 DIAGNOSIS — F419 Anxiety disorder, unspecified: Secondary | ICD-10-CM | POA: Diagnosis not present

## 2017-03-02 ENCOUNTER — Ambulatory Visit: Payer: BLUE CROSS/BLUE SHIELD | Admitting: Family

## 2017-03-04 DIAGNOSIS — J209 Acute bronchitis, unspecified: Secondary | ICD-10-CM | POA: Diagnosis not present

## 2017-03-04 DIAGNOSIS — J029 Acute pharyngitis, unspecified: Secondary | ICD-10-CM | POA: Diagnosis not present

## 2017-03-06 ENCOUNTER — Encounter: Payer: Self-pay | Admitting: *Deleted

## 2017-03-07 DIAGNOSIS — F419 Anxiety disorder, unspecified: Secondary | ICD-10-CM | POA: Diagnosis not present

## 2017-03-08 ENCOUNTER — Encounter: Payer: BLUE CROSS/BLUE SHIELD | Attending: Pediatrics | Admitting: *Deleted

## 2017-03-08 DIAGNOSIS — F509 Eating disorder, unspecified: Secondary | ICD-10-CM | POA: Insufficient documentation

## 2017-03-08 DIAGNOSIS — F5000 Anorexia nervosa, unspecified: Secondary | ICD-10-CM

## 2017-03-08 DIAGNOSIS — Z713 Dietary counseling and surveillance: Secondary | ICD-10-CM | POA: Insufficient documentation

## 2017-03-08 NOTE — Progress Notes (Signed)
Appointment start time: 1700  Appointment end time: 1800  Patient was seen on 03/08/17 for nutrition counseling pertaining to disordered eating.    Primary care provider: NA Therapist: Noni SaupeHeather Kitchen Any other medical team members: Dr. Karlene EinsteinPerry/Adolescent medicine NPs   Assessment Started back with school and had bronchitis.  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  School is tough and she might not work at all.  Dad is giving her a hard time about needing to work.  She realized with Herbert SetaHeather that she was taking on too much   Wasn't sleeping well for awhile, but has been sleeping better lately, is getting 8-9 hours sleep.  Energy pretty good.   No dizziness.  Some headaches, but thinks it's related to bronchitis  Watched [facility] documentary and liked it. [facility] was higher than she thought and she strugged with that some.  Didn't change her eating, but still mentally a struggle. People around her diet/have disordered eating.   Constipated lately.  Getting better with antibiotic and some diarrhea  Liver enzymes were elevated recently.  Urinalysis also abnormal   Thinks eating is ok now, but hasn't been great. Didn't track while at the beach.  Thinks she ate enough and snacked more and ate when she was hungry.  Ate more "junk food." Hasn't been keeping great track since going to the beach Hasn't been drinking enough water  Doesn't have adult providers.  Unhappy with current PCP and is looking for alternatives.  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  Also needs adult neurologist and cardiologist.  I have suggested [facility] which she likes, but [facility] [place] [facility] [place]  She's been doing well overall, but with school and clinicals she often doesn't practice the best self care   Physical activity: nothing structured   Growth Metrics: Current weight : 137; BMI: 22  Previous growth data: weight/age  81-90th%; height/age at 75%; BMI/age  42-85% Goal BMI range based on growth chart data: 22-25 Goal weight range based on growth chart data: 130-145 lb   Mental health diagnosis: AN, B/P subtype   Dietary assessment: mom says she's always been picky; currently following gluten-free diet  24 hour recall B: banana choc chip muffin and CIB L: chicken salad sandwich and cantaloupe S: trail mix D: japanese food Beverages: propel, some water with 2 meals  Today B: 2 waffles with nutella L: CFA 8 grilled nuggets, yogurt parfait.  Sprite S: BBQ chips.  Propel.  Iced coffee with caramel  Estimated energy intake: 1400 kcal  Estimated energy needs: 2200 kcal for weight maintenance 275 g CHO 110 g pro 73 g fat  Nutrition Diagnosis: NI-1.4 Inadequate energy intake As related to restricting and purging.  As evidenced by dietary recall  Intervention/Goals:  Nutrition counseling provided.  [facility]ated [facility] and also suggested [facility] in [place].  Suggested [facility].  Stressed need for adequate nutrition and fluids now that school is back in session.  Challenged her anxiety over weight.  She's stable for now.  Suggested talking with Neysa BonitoChristy about liver enzymes  Monitoring and Evaluation: Patient will follow up in 4-6 weeks.

## 2017-03-08 NOTE — Patient Instructions (Signed)
Consider PCP/eating disorder care with [facility] or [facility] in [place] [phone] I like Dr. Adriana MccallumJaffee at [facility]  [phone]

## 2017-03-21 DIAGNOSIS — F419 Anxiety disorder, unspecified: Secondary | ICD-10-CM | POA: Diagnosis not present

## 2017-03-28 DIAGNOSIS — F419 Anxiety disorder, unspecified: Secondary | ICD-10-CM | POA: Diagnosis not present

## 2017-04-03 DIAGNOSIS — G909 Disorder of the autonomic nervous system, unspecified: Secondary | ICD-10-CM | POA: Diagnosis not present

## 2017-04-04 DIAGNOSIS — F419 Anxiety disorder, unspecified: Secondary | ICD-10-CM | POA: Diagnosis not present

## 2017-04-06 ENCOUNTER — Other Ambulatory Visit: Payer: Self-pay | Admitting: Pediatrics

## 2017-04-06 DIAGNOSIS — G43009 Migraine without aura, not intractable, without status migrainosus: Secondary | ICD-10-CM

## 2017-04-07 ENCOUNTER — Other Ambulatory Visit: Payer: Self-pay | Admitting: Pediatrics

## 2017-04-07 DIAGNOSIS — G43009 Migraine without aura, not intractable, without status migrainosus: Secondary | ICD-10-CM

## 2017-04-09 ENCOUNTER — Encounter: Payer: Self-pay | Admitting: Emergency Medicine

## 2017-04-09 ENCOUNTER — Emergency Department
Admission: EM | Admit: 2017-04-09 | Discharge: 2017-04-09 | Disposition: A | Discharge status: Home / Self Care | Payer: BLUE CROSS/BLUE SHIELD | Attending: Emergency Medicine | Admitting: Emergency Medicine

## 2017-04-09 DIAGNOSIS — R55 Syncope and collapse: Secondary | ICD-10-CM | POA: Insufficient documentation

## 2017-04-09 DIAGNOSIS — Z79899 Other long term (current) drug therapy: Secondary | ICD-10-CM | POA: Diagnosis not present

## 2017-04-09 LAB — BASIC METABOLIC PANEL
ANION GAP: 7 (ref 5–15)
BUN: 15 mg/dL (ref 6–20)
CALCIUM: 9.3 mg/dL (ref 8.9–10.3)
CO2: 24 mmol/L (ref 22–32)
Chloride: 107 mmol/L (ref 101–111)
Creatinine, Ser: 0.85 mg/dL (ref 0.44–1.00)
Glucose, Bld: 103 mg/dL — ABNORMAL HIGH (ref 65–99)
Potassium: 3.8 mmol/L (ref 3.5–5.1)
Sodium: 138 mmol/L (ref 135–145)

## 2017-04-09 LAB — CBC
HEMATOCRIT: 38.1 % (ref 35.0–47.0)
HEMOGLOBIN: 13.1 g/dL (ref 12.0–16.0)
MCH: 30.9 pg (ref 26.0–34.0)
MCHC: 34.4 g/dL (ref 32.0–36.0)
MCV: 89.7 fL (ref 80.0–100.0)
Platelets: 253 10*3/uL (ref 150–440)
RBC: 4.24 MIL/uL (ref 3.80–5.20)
RDW: 12.7 % (ref 11.5–14.5)
WBC: 7.1 10*3/uL (ref 3.6–11.0)

## 2017-04-09 LAB — URINALYSIS, COMPLETE (UACMP) WITH MICROSCOPIC
BACTERIA UA: NONE SEEN
BILIRUBIN URINE: NEGATIVE
GLUCOSE, UA: NEGATIVE mg/dL
HGB URINE DIPSTICK: NEGATIVE
KETONES UR: NEGATIVE mg/dL
Leukocytes, UA: NEGATIVE
NITRITE: NEGATIVE
PROTEIN: 30 mg/dL — AB
RBC / HPF: NONE SEEN RBC/hpf (ref 0–5)
Specific Gravity, Urine: 1.017 (ref 1.005–1.030)
Squamous Epithelial / HPF: NONE SEEN
WBC UA: NONE SEEN WBC/hpf (ref 0–5)
pH: 7 (ref 5.0–8.0)

## 2017-04-09 LAB — GLUCOSE, CAPILLARY: Glucose-Capillary: 97 mg/dL (ref 65–99)

## 2017-04-09 LAB — POCT PREGNANCY, URINE: Preg Test, Ur: NEGATIVE

## 2017-04-09 NOTE — ED Triage Notes (Signed)
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 Pt has history of POTS. Pt reports headache but denies any other symptoms. Pt states she thinks she got hot and this caused her to pass out. Pt alert and oriented in triage, drinking orange juice.

## 2017-04-09 NOTE — ED Notes (Addendum)
Pt arrives after 3 episodes of syncope while working, states she was in a room and got really hot, pt drinking orange juice upon arrival, hx of POTS 98.1 temp, HR 60, 96/63 BP 97% RA

## 2017-04-09 NOTE — ED Provider Notes (Signed)
[facility] Emergency Department Provider Note   ____________________________________________   I have reviewed the triage vital signs and the nursing notes.   HISTORY  Chief Complaint Loss of Consciousness   History limited by: Not Limited   HPI Jessica [name] is a 21 y.o. female who presents to the emergency department today after a syncopal episode. The patient was working when she passed out. 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 She thinks she got too hot. She does have a history of syncope and pots. Patient states last time she passed out 6 months ago. Did have a little sense that she might pass out right before it happened. She denies any chest pain or palpitations. She currently has a slight headache which she states is normal for her after she passes out. She denies any recent change in medications although states that her cardiologist this talked about weaning her off of one. She denies any recent illness, or fever.   Past Medical History:  Diagnosis Date  . Adjustment disorder with mixed anxiety and depressed mood   . Anorexia nervosa   . Autonomic dysfunction   . Migraine headache   . POTS (postural orthostatic tachycardia syndrome)   . Splenomegaly   . Syncope 09/22/2014  . Vasovagal syncope     Patient Active Problem List   Diagnosis Date Noted  . Hematuria 03/22/2016  . Adjustment disorder with mixed anxiety and depressed mood 07/13/2015  . Chronic bilateral lower abdominal pain 05/24/2015  . Anorexia nervosa 04/29/2015  . Postural orthostatic tachycardia syndrome 09/26/2014  . Migraine without aura 10/30/2012  . Episodic tension type headache 10/30/2012  . Orthostatic hypotension 10/30/2012    Past Surgical History:  Procedure Laterality Date  . ANTERIOR CRUCIATE LIGAMENT REPAIR  03/2010  . EYE SURGERY  2000   clogged tear duct  . WISDOM TOOTH EXTRACTION      Prior to Admission medications   Medication  Sig Start Date End Date Taking? Authorizing Provider  atenolol (TENORMIN) 25 MG tablet Take 0.5 tablets (12.5 mg total) by mouth daily. 07/27/15   Tyrone Nine, MD  fludrocortisone (FLORINEF) 0.1 MG tablet Take 1 tablet by mouth daily 04/13/15   Owens Shark, MD  FLUoxetine (PROZAC) 40 MG capsule TAKE 1 CAPSULE (40 MG TOTAL) BY MOUTH DAILY. 12/04/16   Millican, Neysa Bonito, NP  fluticasone (FLONASE) 50 MCG/ACT nasal spray Place 2 sprays into both nostrils daily. 11/16/15   Christianne Dolin, NP  ibuprofen (ADVIL,MOTRIN) 800 MG tablet Take 1 tablet (800 mg total) by mouth every 8 (eight) hours as needed for mild pain or moderate pain. 12/10/15   Trixie Dredge, PA-C  JUNEL 1.5/30 1.5-30 MG-MCG tablet Take 1 tablet by mouth daily. 08/29/16   Porfirio Oar, PA-C  OLANZapine (ZYPREXA) 2.5 MG tablet TAKE 1/2 TABLET BY MOUTH AT BEDTIME. 12/11/16   Verneda Skill, FNP  ondansetron (ZOFRAN ODT) 8 MG disintegrating tablet Take 1 tablet (8 mg total) by mouth every 8 (eight) hours as needed. 01/17/17   Christianne Dolin, NP  pantoprazole (PROTONIX) 40 MG tablet  08/15/16   [provider]  promethazine (PHENERGAN) 25 MG tablet TAKE 1 TABLET BY MOUTH EVERY 8 HOURS AS NEEDED FOR NAUSEA OR VOMITING. 01/04/17   Porfirio Oar, PA-C  QUDEXY XR 150 MG CS24 sprinkle capsule TAKE 1 CAPSULE BY MOUTH AT BEDTIME 04/06/17   Elveria Rising, NP  SUMAtriptan (IMITREX) 25 MG tablet Take 1 tablet (25 mg total)  by mouth every 2 (two) hours as needed for migraine. May repeat in 2 hours if headache persists or recurs. 01/17/17   Christianne Dolin, NP    Allergies Amoxicillin  Family History  Problem Relation Age of Onset  . Migraines Mother        Started 4th or 5th grade  . Seizures Mother        Febrile Seizures as a child  . Seizures Father        Febrile Seizures as a child  . Hypertension Father   . Kidney Stones Father   . Migraines Maternal Aunt   . Migraines Maternal Uncle   . Diabetes Maternal Grandmother    . Stroke Maternal Grandfather        mini-stroke  . Cancer Maternal Grandfather        Bladder cancer, Died at 41  . Diabetes Maternal Grandfather   . Mental retardation Other        Maternal Second Cousin  . Other Other        Maternal Great Uncle had some sort of Retinal Vessel Occlusion  . Breast cancer Maternal Aunt   . Cancer Maternal Aunt        Peritoneal CA  . Eating disorder Maternal Aunt   . Depression Unknown        Father's side of the family    Social History Social History  Substance Use Topics  . Smoking status: Never Smoker  . Smokeless tobacco: Never Used  . Alcohol use 0.0 oz/week     Comment: Drinks on occasion     Review of Systems Constitutional: No fever/chills Eyes: No visual changes. ENT: No sore throat. Cardiovascular: Denies chest pain. Respiratory: Denies shortness of breath. Gastrointestinal: No abdominal pain.  No nausea, no vomiting.  No diarrhea.   Genitourinary: Negative for dysuria. Musculoskeletal: Negative for back pain. Skin: Negative for rash. Neurological: Positive for headache.   ____________________________________________   PHYSICAL EXAM:  VITAL SIGNS: ED Triage Vitals  Enc Vitals Group     BP 04/09/17 1155 105/66     Pulse Rate 04/09/17 1155 72     Resp 04/09/17 1155 18     Temp 04/09/17 1155 97.9 F (36.6 C)     Temp Source 04/09/17 1155 Oral     SpO2 04/09/17 1155 100 %     Weight 04/09/17 1156 130 lb (59 kg)     Height 04/09/17 1156  (1.676 m)     Head Circumference --      Peak Flow --      Pain Score 04/09/17 1155 7   Constitutional: Alert and oriented. Well appearing and in no distress. Eyes: Conjunctivae are normal.  ENT   Head: Normocephalic and atraumatic.   Nose: No congestion/rhinnorhea.   Mouth/Throat: Mucous membranes are moist.   Neck: No stridor. Hematological/Lymphatic/Immunilogical: No cervical lymphadenopathy. Cardiovascular: Normal rate, regular rhythm.  No murmurs, rubs,  or gallops. Respiratory: Normal respiratory effort without tachypnea nor retractions. Breath sounds are clear and equal bilaterally. No wheezes/rales/rhonchi. Gastrointestinal: Soft and non tender. No rebound. No guarding.  Genitourinary: Deferred Musculoskeletal: Normal range of motion in all extremities. Neurologic:  Normal speech and language. No gross focal neurologic deficits are appreciated.  Skin:  Skin is warm, dry and intact. No rash noted. Psychiatric: Mood and affect are normal. Speech and behavior are normal. Patient exhibits appropriate insight and judgment.  ____________________________________________    LABS (pertinent positives/negatives)  Upreg negative CBC  WBC 7.1 Hgb 13.1  BMP wnl save glu 103  ____________________________________________   EKG  I, Phineas Semen, attending physician, personally viewed and interpreted this EKG  EKG Time: 1206 Rate: 68 Rhythm: normal sinus rhythm Axis: normal Intervals: qtc 450 QRS: narrow ST changes: no st elevation Impression: normal ekg  ____________________________________________    RADIOLOGY  None  ____________________________________________   PROCEDURES  Procedures  ____________________________________________   INITIAL IMPRESSION / ASSESSMENT AND PLAN / ED COURSE  Pertinent labs & imaging results that were available during my care of the patient were reviewed by me and considered in my medical decision making (see chart for details).  Patient here after episode of syncope. Has history of POTS. Denies chest pain, palpitations. Will discharge to follow up with cardiologist.   ____________________________________________   FINAL CLINICAL IMPRESSION(S) / ED DIAGNOSES  Final diagnoses:  Syncope, unspecified syncope type     Note: This dictation was prepared with Dragon dictation. Any transcriptional errors that result from this process are unintentional     Phineas Semen,  MD 04/09/17 1529

## 2017-04-09 NOTE — Discharge Instructions (Signed)
Please seek medical attention for any high fevers, chest pain, shortness of breath, change in behavior, persistent vomiting, bloody stool or any other new or concerning symptoms.  

## 2017-04-10 ENCOUNTER — Encounter: Payer: BLUE CROSS/BLUE SHIELD | Attending: Pediatrics | Admitting: *Deleted

## 2017-04-10 DIAGNOSIS — Z713 Dietary counseling and surveillance: Secondary | ICD-10-CM | POA: Insufficient documentation

## 2017-04-10 DIAGNOSIS — F509 Eating disorder, unspecified: Secondary | ICD-10-CM | POA: Insufficient documentation

## 2017-04-10 DIAGNOSIS — F419 Anxiety disorder, unspecified: Secondary | ICD-10-CM | POA: Diagnosis not present

## 2017-04-10 DIAGNOSIS — F5000 Anorexia nervosa, unspecified: Secondary | ICD-10-CM

## 2017-04-10 NOTE — Progress Notes (Signed)
Appointment start time: 0930  Appointment end time: 1015  Patient was seen on 04/10/17 for nutrition counseling pertaining to disordered eating.    Primary care provider: NA Therapist: Noni Saupe Any other medical team members: Dr. Karlene Einstein medicine NPs   Assessment Passed out yesterday at work.  Couldn't wake up.  Went to ED Hadn't eaten in 5 hours.  Could feel it coming on.  Had been drinking water, but probably not enough  No other dizziness.  Had been to cards recently and was doing well.   Will see Christy tomorrow.  Is requesting more specific urinalysis due to abnormal results yesterday Has not found adult providers  Sleeping a lot.  Still needing naps. Sleeping at least 8 hours at night Headaches were better, but passing out didn't help and the weather doesn't help Reflux has been fine.  More upset stomachs lately. More constipated lately.  No BM in a few days Pretty stressed.  Things with sister are rough Wasn't eating well at first, but doing better now.    Physical activity: nothing structured   Growth Metrics: Current weight : 133; BMI: 22  Previous growth data: weight/age  4-90th%; height/age at 75%; BMI/age 21-85% Goal BMI range based on growth chart data: 22-25 Goal weight range based on growth chart data: 130-145 lb   Mental health diagnosis: AN, B/P subtype   Dietary assessment: mom says she's always been picky; currently following gluten-free diet  24 hour recall luna bar, CIB At ED PB graham crackers [place] chicken and quinoa Ice cream  Saturday Waffles with nutella [place] chicken and quinoa banana chocolate chip muffin Crab salad, salad, rice Ice cream Beverages: 24 oz water, propel, maybe something else    Estimated energy intake: 1400-1600 kcal  Estimated energy needs: 2200 kcal for weight maintenance 275 g CHO 110 g pro 73 g fat  Nutrition Diagnosis: NI-1.4 Inadequate energy intake As related to restricting and  purging.  As evidenced by dietary recall  Intervention/Goals:  Nutrition counseling provided.  Reiterated need for adult providers  Stressed need for adequate nutrition and fluids now that school is back in session.   Suggested talking with Neysa Bonito about urine  Monitoring and Evaluation: Patient will follow up in 4 weeks.

## 2017-04-10 NOTE — Patient Instructions (Signed)
Jessica [name] in [place] [phone] I like Dr. Adriana Mccallum at [facility]  [phone] Find adult cards- let current provider know about syncope   1 large water bottle and 2 Propels during the day.  More fluids at home Eat every 3 hours.

## 2017-04-11 ENCOUNTER — Ambulatory Visit (INDEPENDENT_AMBULATORY_CARE_PROVIDER_SITE_OTHER): Payer: BLUE CROSS/BLUE SHIELD | Admitting: Family

## 2017-04-11 ENCOUNTER — Encounter: Payer: Self-pay | Admitting: Family

## 2017-04-11 VITALS — BP 105/66 | HR 68 | Ht 65.35 in | Wt 131.4 lb

## 2017-04-11 DIAGNOSIS — F4323 Adjustment disorder with mixed anxiety and depressed mood: Secondary | ICD-10-CM | POA: Diagnosis not present

## 2017-04-11 DIAGNOSIS — Z1389 Encounter for screening for other disorder: Secondary | ICD-10-CM | POA: Diagnosis not present

## 2017-04-11 DIAGNOSIS — F5000 Anorexia nervosa, unspecified: Secondary | ICD-10-CM | POA: Diagnosis not present

## 2017-04-11 LAB — POCT URINALYSIS DIPSTICK
BILIRUBIN UA: NEGATIVE
Glucose, UA: NEGATIVE
KETONES UA: NEGATIVE
NITRITE UA: NEGATIVE
PH UA: 8 (ref 5.0–8.0)
Protein, UA: NEGATIVE
RBC UA: NEGATIVE
Urobilinogen, UA: NEGATIVE E.U./dL — AB

## 2017-04-11 NOTE — Patient Instructions (Signed)
It was a pleasure seeing you today!   Continue to take your medications as prescribed; no changes for today.  The episode of passing out 2 days ago was likely situational, but we would recommend checking in with cardiology about your atenolol taper.   Your urine today showed some leukocyte esterase (dirty urine) but since you are not having any symptoms of a UTI, there is no need for antibiotics.  If you start having burning on urination, pain on urination, urinary frequency or urgency, please return to clinic.   We will see you in 2 months!
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
## 2017-04-18 DIAGNOSIS — F419 Anxiety disorder, unspecified: Secondary | ICD-10-CM | POA: Diagnosis not present

## 2017-04-20 ENCOUNTER — Encounter: Payer: Self-pay | Admitting: Family

## 2017-04-21 DIAGNOSIS — N390 Urinary tract infection, site not specified: Secondary | ICD-10-CM | POA: Diagnosis not present

## 2017-04-25 DIAGNOSIS — F419 Anxiety disorder, unspecified: Secondary | ICD-10-CM | POA: Diagnosis not present

## 2017-05-02 DIAGNOSIS — F419 Anxiety disorder, unspecified: Secondary | ICD-10-CM | POA: Diagnosis not present

## 2017-05-03 ENCOUNTER — Other Ambulatory Visit: Payer: Self-pay | Admitting: Physician Assistant

## 2017-05-07 ENCOUNTER — Encounter: Payer: Self-pay | Admitting: Physician Assistant

## 2017-05-07 ENCOUNTER — Other Ambulatory Visit: Payer: Self-pay | Admitting: Family

## 2017-05-07 DIAGNOSIS — G43009 Migraine without aura, not intractable, without status migrainosus: Secondary | ICD-10-CM

## 2017-05-07 DIAGNOSIS — G90A Postural orthostatic tachycardia syndrome (POTS): Secondary | ICD-10-CM | POA: Insufficient documentation

## 2017-05-07 DIAGNOSIS — R000 Tachycardia, unspecified: Secondary | ICD-10-CM

## 2017-05-07 DIAGNOSIS — I951 Orthostatic hypotension: Secondary | ICD-10-CM

## 2017-05-07 NOTE — Telephone Encounter (Signed)
Jessica [name], this patient was last seen March 2018. We have already sent the 30-day courtesy refill. How would you like to proceed?

## 2017-05-07 NOTE — Telephone Encounter (Signed)
Refill the prescription again one time and I will send a message to the scheduler to contact her. 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

## 2017-05-08 ENCOUNTER — Ambulatory Visit (INDEPENDENT_AMBULATORY_CARE_PROVIDER_SITE_OTHER): Payer: BLUE CROSS/BLUE SHIELD | Admitting: Family

## 2017-05-08 ENCOUNTER — Encounter (INDEPENDENT_AMBULATORY_CARE_PROVIDER_SITE_OTHER): Payer: Self-pay | Admitting: Family

## 2017-05-08 VITALS — BP 94/64 | HR 86 | Ht 65.25 in | Wt 134.2 lb

## 2017-05-08 DIAGNOSIS — G44219 Episodic tension-type headache, not intractable: Secondary | ICD-10-CM

## 2017-05-08 DIAGNOSIS — G43009 Migraine without aura, not intractable, without status migrainosus: Secondary | ICD-10-CM | POA: Diagnosis not present

## 2017-05-08 DIAGNOSIS — I951 Orthostatic hypotension: Secondary | ICD-10-CM | POA: Diagnosis not present

## 2017-05-08 DIAGNOSIS — F5000 Anorexia nervosa, unspecified: Secondary | ICD-10-CM

## 2017-05-08 NOTE — Progress Notes (Signed)
Patient: Jessica [name] MRN: [id number] Sex: female DOB: [birth year]  Provider: Elveria Rising, NP Location of Care: [facility]  Note type: Routine return visit  History of Present Illness: Referral Source: Theora Gianotti, PA-C History from: mother, patient and [facility] chart Chief Complaint: Migraines  ORI KREITER is a 21 y.o. woman with history of migraines, vasovagal syncope with orthostatic hypotension that is similar to POTS, palpitations, closed head injury and anorexia nervosa. She was last seen October 11, 2016. She is taking and tolerating Qudexy XR for migraine prevention and tells me today that she has had only a few migraines since her last visit until the end of last week when she developed a migraine that lasted for several days. She says that Sumatriptan has given her some relief but that the migraine returned. She has been able to continue her activities for the most part but has had to lie down for some portion of each day.   Catharina is treated by cardiology for her vasovagal syncope and orthostatic intolerance. It has been treated with a combination of hydration, exercise, elevation of the head of her bed, Atenolol, Midodrine and Florinef. She tells me today that her cardiologist has recommended that she begin tapering off some of her medications and that she began tapering the Atenolol on October 1st from a whole tablet to a half tablet. She has tolerated it in terms of her cardiac symptoms but has experienced increase in headache. 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 She was in an isolation room bathing a patient, became overheated and fainted. She says that she tries to drink 50 oz of water each day, sleeps between 6-8 hrs each night and tries to follow the exercise regimen that her cardiologist has recommended but says that is difficult given her school and work schedule. She denies skipping meals.    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 She is looking forward to graduation and hopes to work in pediatric nursing when she graduates. She says that this semester is stressful because she doesn't like her classes but that she is managing it well. Shellene has been otherwise healthy and neither she nor her mother have other health concerns for her today other than previously mentioned.  Review of Systems: Please see the HPI for neurologic and other pertinent review of systems. Otherwise, all other systems were reviewed and were negative.    Past Medical History:  Diagnosis Date  . Adjustment disorder with mixed anxiety and depressed mood   . Anorexia nervosa   . Autonomic dysfunction   . Migraine headache   . POTS (postural orthostatic tachycardia syndrome)   . Splenomegaly   . Syncope 09/22/2014  . Vasovagal syncope    Hospitalizations: No., Head Injury: No., Nervous System Infections: No., Immunizations up to date: Yes.   Past Medical History Comments: She had onset of headaches, some of them migraines beginning in the 4th or 5th grade. These involve stabbing left-sided pain that lasts for an hour before easing. The episodes are recurrent and are associated with nausea and vomiting, left sided throbbing, no sensitivity to light, sound, or movement. The patient recently had at least one episode of 10-15 minutes of tingling in her face and fingers associated with headaches. She also had episodes of what appeared to be positional vertigo, but became syncope. She was evaluated by Dr. Darlis Loan on  April 08, 2012. He noted a previous evaluation, June 22, 2011 for syncope. EKG and echocardiogram were normal. 24-hour Holter monitor was normal. She had persistent episodes of tachycardia and dizziness following the evaluation. She has had initial improvement with Florinef; however, on 0.3 mg per day, she did not have persistent improvement. She complained of three episodes of  loss of consciousness per month. On March 04, 2012, she had an episode of stiffening which was unusual. A decision was made to place her on atenolol when her pulse went up from 68 to 105 from supine to standing. She had a closed head injury from a fall on July 15, 2015 and had post concussion syndrome symptoms for a couple of months afterwards.   Surgical History Past Surgical History:  Procedure Laterality Date  . ANTERIOR CRUCIATE LIGAMENT REPAIR  03/2010  . EYE SURGERY  2000   clogged tear duct  . WISDOM TOOTH EXTRACTION      Family History family history includes Breast cancer in her maternal aunt; Cancer in her maternal aunt and maternal grandfather; Depression in her unknown relative; Diabetes in her maternal grandfather and maternal grandmother; Eating disorder in her maternal aunt; Hypertension in her father; Kidney Stones in her father; Mental retardation in her other; Migraines in her maternal aunt, maternal uncle, and mother; Other in her other; Seizures in her father and mother; Stroke in her maternal grandfather. Family History is otherwise negative for migraines, seizures, cognitive impairment, blindness, deafness, birth defects, chromosomal disorder, autism.  Social History Social History   Social History  . Marital status: Single    Spouse name: N/A  . Number of children: N/A  . Years of education: N/A   Occupational History  . student     [employer]   Social History Main Topics  . Smoking status: Never Smoker  . Smokeless tobacco: Never Used  . Alcohol use 0.0 oz/week     Comment: Drinks on occasion   . Drug use: No  . Sexual activity: No   Other Topics Concern  . 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    She is majoring in Nursing.    She lives with her parents and siblings.    She enjoys school, shopping,watching TV, drinking decaf coffee and helping with children at church.      Allergies Allergies  Allergen Reactions  . Amoxicillin Rash    Physical Exam BP 94/64   Pulse 86   Ht 5' 5.25" (1.657 m)   Wt 134 lb 3.2 oz (60.9 kg)   BMI 22.16 kg/m  General: Well developed, well nourished, seated, in no evident distress, brown hair, brown eyes, right handed Head: Head normocephalic and atraumatic.  Oropharynx benign. Neck: Supple with no carotid bruits Cardiovascular: Regular rate and rhythm, no murmurs Respiratory: Breath sounds clear to auscultation Musculoskeletal: No obvious deformities or scoliosis Skin: No rashes or neurocutaneous lesions  Neurologic Exam Mental Status: Awake and fully alert.  Oriented to place and time.  Recent and remote memory intact.  Attention span, concentration, and fund of knowledge appropriate.  Mood and affect appropriate. Cranial Nerves: Fundoscopic exam reveals sharp disc margins.  Pupils equal, briskly reactive to light.  Extraocular movements full without nystagmus.  Visual fields full to confrontation.  Hearing intact and symmetric to finger rub.  Facial sensation intact.  Face tongue, palate move normally and symmetrically.  Neck flexion and extension normal. Motor: Normal bulk  and tone. Normal strength in all tested extremity muscles. Sensory: Intact to touch and temperature in all extremities.  Coordination: Rapid alternating movements normal in all extremities.  Finger-to-nose and heel-to shin performed accurately bilaterally.  Romberg negative. Gait and Station: Arises from chair without difficulty.  Stance is normal. Gait demonstrates normal stride length and balance.   Able to heel, toe and tandem walk without difficulty. Reflexes: Diminished and symmetric. Toes downgoing.  Impression 1. Migraine with aura 2. Orthostatic hypotension with syncopal episodes 3. Episodic tension headaches 4. Anorexia nervosa 5. History of closed head injury July 15, 2015   Recommendations for plan of care The patient's  previous [facility] records were reviewed. Antonio has neither had nor required imaging or lab studies since the last visit. She is a 21 year old woman with history of migraine without aura, tension headaches, orthostatic hypotension with syncope, and anorexia nervosa. She is taking and tolerating Qudexy XR for migraine prevention and was doing well until last weekend when she developed a migraine that has persisted. Sumatriptan has given relief but the migraine returns. Shontay has been able to attend school but has to come home and lie down each day. She decreased the Atenolol dose by 1/2 on October 1st and I recommended that she increase that back to a whole tablet for a week to see if her headaches improve. If they do not, she can continue her taper as she has been doing. Otherwise there is no oral medication that she can try other than Tylenol, Advil and Aleve that will not cause sleepiness. Since she needs to be able to attend school each day, Zakkiyya elected to try the increase in Atenolol and wait for the migraine to resolve on its own. I reminded her to avoid skipping meals, to drink at least 50 oz of water each day and to get at least 8 hours of sleep each night. I will see her back in follow up in May 2019. At that time we will plan for her to transition to an adult practice as she will be graduating and begin work as a Engineer, civil (consulting). Tysheka and her mother agreed with the plans made today.   The medication list was reviewed and reconciled.  No changes were made in the prescribed medications today.  A complete medication list was provided to the patient.   Allergies as of 05/08/2017      Reactions   Amoxicillin Rash      Medication List       Accurate as of 05/08/17 11:59 PM. Always use your most recent med list.          atenolol 25 MG tablet Commonly known as:  TENORMIN Take 0.5 tablets (12.5 mg total) by mouth daily.   fludrocortisone 0.1 MG tablet Commonly known as:  FLORINEF Take 1 tablet by mouth  daily   FLUoxetine 40 MG capsule Commonly known as:  PROZAC TAKE 1 CAPSULE (40 MG TOTAL) BY MOUTH DAILY.   fluticasone 50 MCG/ACT nasal spray Commonly known as:  FLONASE Place 2 sprays into both nostrils daily.   ibuprofen 800 MG tablet Commonly known as:  ADVIL,MOTRIN Take 1 tablet (800 mg total) by mouth every 8 (eight) hours as needed for mild pain or moderate pain.   JUNEL 1.5/30 1.5-30 MG-MCG tablet Generic drug:  Norethindrone Acetate-Ethinyl Estradiol TAKE 1 TABLET BY MOUTH DAILY.   nitrofurantoin (macrocrystal-monohydrate) 100 MG capsule Commonly known as:  MACROBID   OLANZapine 2.5 MG tablet Commonly known as:  ZYPREXA  TAKE 1/2 TABLET BY MOUTH AT BEDTIME.   ondansetron 8 MG disintegrating tablet Commonly known as:  ZOFRAN ODT Take 1 tablet (8 mg total) by mouth every 8 (eight) hours as needed.   pantoprazole 40 MG tablet Commonly known as:  PROTONIX   promethazine 25 MG tablet Commonly known as:  PHENERGAN TAKE 1 TABLET BY MOUTH EVERY 8 HOURS AS NEEDED FOR NAUSEA OR VOMITING.   QUDEXY XR 150 MG Cs24 sprinkle capsule Generic drug:  Topiramate ER TAKE 1 CAPSULE BY MOUTH AT BEDTIME   SUMAtriptan 25 MG tablet Commonly known as:  IMITREX Take 1 tablet (25 mg total) by mouth every 2 (two) hours as needed for migraine. May repeat in 2 hours if headache persists or recurs.       Total time spent with the patient was 25 minutes, of which 50% or more was spent in counseling and coordination of care.   Elveria Rising NP-C

## 2017-05-09 DIAGNOSIS — F419 Anxiety disorder, unspecified: Secondary | ICD-10-CM | POA: Diagnosis not present

## 2017-05-09 MED ORDER — QUDEXY XR 150 MG PO CS24: EXTENDED_RELEASE_CAPSULE | ORAL | 5 refills | Status: DC

## 2017-05-09 MED ORDER — SUMATRIPTAN SUCCINATE 25 MG PO TABS: 25.0000 mg | ORAL_TABLET | ORAL | 5 refills | Status: DC | PRN

## 2017-05-09 NOTE — Patient Instructions (Signed)
Thank you for coming in today.   Instructions for you until your next appointment are as follows: 1. Try increasing the Atenolol back to a whole tablet at bedtime for a week to see if your headache improves. If it does, we will know that the Atenolol was helping your headaches more than we were aware. If your headaches do not improve, continue to taper the medication as your cardiologist recommended.  2. Continue taking Qudexy as you have been doing.  3. Remember that you need to be drinking at least 50 oz of water each day, that you should not skip meals and that you should be getting at least 8 hours of sleep each night as these things can help to reduce headaches. 4. Please sign up for MyChart if you have not done so 5. Please plan to return for follow up in 7 months or sooner if needed.

## 2017-05-10 ENCOUNTER — Encounter: Payer: BLUE CROSS/BLUE SHIELD | Attending: Pediatrics | Admitting: *Deleted

## 2017-05-10 DIAGNOSIS — Z713 Dietary counseling and surveillance: Secondary | ICD-10-CM | POA: Diagnosis not present

## 2017-05-10 DIAGNOSIS — F509 Eating disorder, unspecified: Secondary | ICD-10-CM | POA: Diagnosis not present

## 2017-05-10 DIAGNOSIS — F5000 Anorexia nervosa, unspecified: Secondary | ICD-10-CM

## 2017-05-10 NOTE — Progress Notes (Signed)
Appointment start time: 1000  Appointment end time: 1100  Patient was seen on 05/10/17 for nutrition counseling pertaining to disordered eating.    Primary care provider: NA Therapist: Noni SaupeHeather Kitchen Any other medical team members: Dr. 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  Still taking some naps, but not every day. If she is busy, she doesn't feel tired. Is not exhausted, but just likes naps.  Sometimes she is exhausted.  Adequate sleep at night.  Not trouble sleeping Headaches better today as she changed medication.  Was having daily headaches Lots of upset stomachaches.  Diarrhea intermittently.  Last episode couple days ago.  Started 2 weeks.  Doesn't think it's food related.  Probably stress. Still seeing heather.  No PCP Will continue to see specialists until she graduates.  Will wait to see christy for ED care next visit Doesn't think she needs meds adjusted.   Reflux still, but has been taking it every other days.  No syncopal episodes since last time.  No dizziness  Eating is the same.  Weight is stable.  Doing fairly well.  In adequate fluids.  Wakes up really hungry- questioning if she needs larger night snack   Physical activity: nothing structured   Growth Metrics: Current weight : 133; BMI: 22  Previous growth data: weight/age  14-90th%; height/age at 75%; BMI/age 73-85% Goal BMI range based on growth chart data: 22-25 Goal weight range based on growth chart data: 130-145 lb   Mental health diagnosis: AN, B/P subtype   Dietary assessment: mom says she's always been picky; currently following gluten-free diet  24 hour recall B: waffles with nutella S: Juice shop with protein added L: Broccoli cheddar soup D: Salad with 3 cookies at church S: bowl cereal Beverages: water, tea  Normally more fluids, but still not adequate  Estimated energy intake: 1600 kcal  Estimated energy needs: 2200 kcal  for weight maintenance 275 g CHO 110 g pro 73 g fat  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  As evidenced by dietary recall  Intervention/Goals:  Nutrition counseling provided.  Reiterated need for adult providers Keep it up.   Try kefir and higher protein/fat with night snack Drink more water  Monitoring and Evaluation: Patient will follow up in 6 weeks.

## 2017-05-10 NOTE — Patient Instructions (Signed)
Keep trying to increase fluids Try kefir for GI distress  Increase night snack- peanut butter, cheese, sweet fat

## 2017-05-24 DIAGNOSIS — F419 Anxiety disorder, unspecified: Secondary | ICD-10-CM | POA: Diagnosis not present

## 2017-05-30 ENCOUNTER — Encounter: Payer: Self-pay | Admitting: Family

## 2017-05-30 ENCOUNTER — Ambulatory Visit (INDEPENDENT_AMBULATORY_CARE_PROVIDER_SITE_OTHER): Payer: BLUE CROSS/BLUE SHIELD | Admitting: Family

## 2017-05-30 VITALS — BP 102/69 | HR 66 | Ht 65.35 in | Wt 130.0 lb

## 2017-05-30 DIAGNOSIS — F419 Anxiety disorder, unspecified: Secondary | ICD-10-CM | POA: Diagnosis not present

## 2017-05-30 DIAGNOSIS — Z1389 Encounter for screening for other disorder: Secondary | ICD-10-CM

## 2017-05-30 LAB — POCT URINALYSIS DIPSTICK
Bilirubin, UA: NEGATIVE
Glucose, UA: NEGATIVE
KETONES UA: NEGATIVE
Leukocytes, UA: NEGATIVE
Nitrite, UA: NEGATIVE
PH UA: 7 (ref 5.0–8.0)
PROTEIN UA: NEGATIVE
RBC UA: NEGATIVE
Urobilinogen, UA: NEGATIVE E.U./dL — AB

## 2017-05-30 NOTE — Progress Notes (Signed)
THIS RECORD MAY CONTAIN CONFIDENTIAL INFORMATION THAT SHOULD NOT BE RELEASED WITHOUT REVIEW OF THE SERVICE PROVIDER.  Adolescent Medicine Consultation Follow-Up Visit [name]  is a 21 y.o. female referred by Jessica OarJeffery, [name], PA-C here today for follow-up regarding anorexia nervosa, adjustment disorder with mixed anxiety/depressed mood.    Last seen in [facility] on 04/11/17 for the above.  Plan at last visit included continue prescribed medications, continue treatment with treatment team (dietician, adolescent provider, therapist).  Pertinent Labs? No Growth Chart Viewed? yes   History was provided by the patient and mother.  Interpreter? no  PCP Confirmed?  no  My Chart Activated?   yes   Chief Complaint  Patient presents with  . Follow-up  . Eating Disorder    HPI:    21 yo F followed regularly for anorexia nervosa and adjustment disorder.  She and mother have no concerns at today's visit. Mom's only question is regarding transitioning care now given her age and progression.   Continues to do well with diet, following treatment plan appropriately. Last visit with Nutrition 10/18. Having diarrhea at prior visit, now resolved. Currently having constipation for the past 10 days. Previously had issues with constipation, treated effectively with Miralax.  Taking all medications as prescribed, no new side effects or issues. Had been weaning atenolol but developed headaches and restarted full dose. No complaints currently including lightheadedness, nausea, fatigue, or syncope. Reports taking all medications as prescribed.  Stressors: none other than school at this time School: doing well, test this afternoon. 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  No LMP recorded. Allergies  Allergen Reactions  . Amoxicillin Rash   Outpatient Medications Prior to Visit  Medication Sig Dispense Refill  . atenolol (TENORMIN) 25 MG tablet Take 0.5 tablets (12.5 mg  total) by mouth daily.    Marland Kitchen. FLUoxetine (PROZAC) 40 MG capsule TAKE 1 CAPSULE (40 MG TOTAL) BY MOUTH DAILY. 30 capsule 3  . fluticasone (FLONASE) 50 MCG/ACT nasal spray Place 2 sprays into both nostrils daily. 16 g 12  . ibuprofen (ADVIL,MOTRIN) 800 MG tablet Take 1 tablet (800 mg total) by mouth every 8 (eight) hours as needed for mild pain or moderate pain. 15 tablet 0  . JUNEL 1.5/30 1.5-30 MG-MCG tablet TAKE 1 TABLET BY MOUTH DAILY. 84 tablet 3  . OLANZapine (ZYPREXA) 2.5 MG tablet TAKE 1/2 TABLET BY MOUTH AT BEDTIME. 30 tablet 1  . pantoprazole (PROTONIX) 40 MG tablet     . QUDEXY XR 150 MG CS24 sprinkle capsule TAKE 1 CAPSULE BY MOUTH AT BEDTIME 30 each 5  . SUMAtriptan (IMITREX) 25 MG tablet Take 1 tablet (25 mg total) by mouth every 2 (two) hours as needed for migraine. May repeat in 2 hours if headache persists or recurs. 10 tablet 5  . fludrocortisone (FLORINEF) 0.1 MG tablet Take 1 tablet by mouth daily 90 tablet 5  . ondansetron (ZOFRAN ODT) 8 MG disintegrating tablet Take 1 tablet (8 mg total) by mouth every 8 (eight) hours as needed. (Patient not taking: Reported on 05/30/2017) 4 tablet 0  . promethazine (PHENERGAN) 25 MG tablet TAKE 1 TABLET BY MOUTH EVERY 8 HOURS AS NEEDED FOR NAUSEA OR VOMITING. (Patient not taking: Reported on 05/30/2017) 20 tablet 0  . nitrofurantoin, macrocrystal-monohydrate, (MACROBID) 100 MG capsule   0   No facility-administered medications prior to visit.      Patient Active Problem List   Diagnosis Date Noted  . Autonomic dysfunction 05/07/2017  . Hematuria 03/22/2016  .  Adjustment disorder with mixed anxiety and depressed mood 07/13/2015  . Chronic bilateral lower abdominal pain 05/24/2015  . Anorexia nervosa 04/29/2015  . Postural orthostatic tachycardia syndrome 09/26/2014  . Migraine without aura 10/30/2012  . Episodic tension type headache 10/30/2012  . Orthostatic hypotension 10/30/2012    Social History: Changes with school since last  visit?  no  Lifestyle habits that can impact QOL: Sleep:at least 8 hours nightly, sometimes naps during the day if not busy Eating habits/patterns: following treatment plan Water intake: 30-40 oz daily Exercise: occassional  Confidentiality was discussed with the patient and if applicable, with caregiver as well.  Tobacco?  no Drugs/ETOH?  Occasional drink, no drugs  Suicidal or homicidal thoughts?   no Self injurious behaviors?  no Guns in the home?  no    The following portions of the patient's history were reviewed and updated as appropriate: allergies, current medications, past family history, past medical history, past social history, past surgical history and problem list.  Physical Exam:  Vitals:   05/30/17 1346  BP: 102/69  Pulse: 66  Weight: 130 lb (59 kg)  Height: 5' 5.35" (1.66 m)   BP 102/69 (BP Location: Right Arm, Patient Position: Sitting, Cuff Size: Normal)   Pulse 66   Ht 5' 5.35" (1.66 m)   Wt 130 lb (59 kg)   BMI 21.40 kg/m  Body mass index: body mass index is 21.4 kg/m. Growth percentile SmartLinks can only be used for patients less than 22 years old.  Physical Exam   General:   alert, active, no acute distress. Well appearing  Skin:   warm, dry, no rashes or other lesions  Oral cavity:   lips, mucosa, and tongue normal without erythema or exudates; teeth and gums normal  Eyes:   sclerae white, pupils equal and reactive, EOMI  Nose: clear, no discharge  Neck:   supple, no LAD, full ROM  Lungs:  clear to auscultation bilaterally, no wheezes or crackles, good air movement throughout  Heart:   regular rate and rhythm, S1, S2 normal, no murmur, click, rub or gallop   Abdomen:  soft, non-tender; bowel sounds normal; no masses,  no organomegaly  Extremities:   extremities normal, atraumatic, no cyanosis or edema  Neuro:  normal without focal findings, mental status, speech normal, alert and oriented   PHQ-SADS PHQ-15: 12 GAD-7: 5 PHQ-9:  5  Assessment/Plan:  Anorexia nervosa - Following treatment plan appropraitely, including seeing members of treatment team regularly - Weight remains stable - will continue to follow  Adjustment disorder with mixed anxiety and depressed mood - overall doing well in school, no new concerns - Continue Prozac and Zyprexa, no changes  Constipation - instructed to restart Miralax 1 cap daily PRN to achieve regular, soft stools  Screening for genitourinary condition - POCT urinalysis dipstick within normal limits  BH screenings: PHQ-SADS reviewed and indicated no intervention needed. Screens discussed with patient and parent and adjustments to plan made accordingly.   Follow-up:  Referred to Delbert HarnessJulie Kordsmeier, FNP-C for transition of care  Medical decision-making:  >25 minutes spent face to face with patient with more than 50% of appointment spent discussing diagnosis, management, follow-up, and reviewing of anorexia nervosa, adjustment disorder, and constipation including medication management, nutrition plan, and care team plan.  Simone CuriaSean Tiawana Forgy, MD PGY-3

## 2017-05-30 NOTE — Patient Instructions (Signed)
  Please call to schedule an appointment with [facility] [place].  I would recommend Delbert HarnessJulie Kordsmeier, FNP-C.   Contact [address] [place] [phone]  [place]: [phone] Behavioral Medicine: [phone] Fax: [phone]    Delbert HarnessJulie Kordsmeier, FNP Primary Care EDUCATION Master of Science in Nursing, [employer] [place] [place], [place], [place] Practitioner Certificate (FNP); [employer], [place] PROFESSIONAL MEMBERSHIPS [employer] Stage managerurse Practitioners [employer] Nursing Honor Society Certified Pediatric Nurse BOARD CERTIFICATIONS AANP-FNP-C, [facility] [place] Board of Nursing ABOUT Delbert HarnessJulie Kordsmeier is a family nurse practitioner who values a patient-centered approach in her practice. She encourages healthy lifestyle modifications for her patients while engaging them in their health care choices. She is interested in establishing therapeutic plans of care with patients for improving health outcomes long term. Her interests include primary care for individuals throughout the lifespan, wellness care, and counseling patients regarding healthy lifestyle choices. In her spare time, Raynelle FanningJulie enjoys spending time with her husband, four sons, and pets.

## 2017-06-05 ENCOUNTER — Other Ambulatory Visit (INDEPENDENT_AMBULATORY_CARE_PROVIDER_SITE_OTHER): Payer: Self-pay | Admitting: Family

## 2017-06-05 DIAGNOSIS — G43009 Migraine without aura, not intractable, without status migrainosus: Secondary | ICD-10-CM

## 2017-06-07 ENCOUNTER — Other Ambulatory Visit: Payer: Self-pay

## 2017-06-07 ENCOUNTER — Encounter (HOSPITAL_BASED_OUTPATIENT_CLINIC_OR_DEPARTMENT_OTHER): Payer: Self-pay

## 2017-06-07 DIAGNOSIS — F419 Anxiety disorder, unspecified: Secondary | ICD-10-CM | POA: Diagnosis not present

## 2017-06-07 DIAGNOSIS — Z79899 Other long term (current) drug therapy: Secondary | ICD-10-CM | POA: Diagnosis not present

## 2017-06-07 DIAGNOSIS — R1031 Right lower quadrant pain: Secondary | ICD-10-CM | POA: Diagnosis not present

## 2017-06-07 DIAGNOSIS — R109 Unspecified abdominal pain: Secondary | ICD-10-CM | POA: Diagnosis not present

## 2017-06-07 DIAGNOSIS — R197 Diarrhea, unspecified: Secondary | ICD-10-CM | POA: Diagnosis not present

## 2017-06-07 LAB — CBC
HEMATOCRIT: 40.7 % (ref 36.0–46.0)
HEMOGLOBIN: 13.5 g/dL (ref 12.0–15.0)
MCH: 30.4 pg (ref 26.0–34.0)
MCHC: 33.2 g/dL (ref 30.0–36.0)
MCV: 91.7 fL (ref 78.0–100.0)
Platelets: 261 10*3/uL (ref 150–400)
RBC: 4.44 MIL/uL (ref 3.87–5.11)
RDW: 12.3 % (ref 11.5–15.5)
WBC: 6.9 10*3/uL (ref 4.0–10.5)

## 2017-06-07 LAB — URINALYSIS, ROUTINE W REFLEX MICROSCOPIC
Bilirubin Urine: NEGATIVE
Glucose, UA: NEGATIVE mg/dL
KETONES UR: NEGATIVE mg/dL
NITRITE: NEGATIVE
PROTEIN: NEGATIVE mg/dL
Specific Gravity, Urine: 1.01 (ref 1.005–1.030)
pH: 6.5 (ref 5.0–8.0)

## 2017-06-07 LAB — COMPREHENSIVE METABOLIC PANEL
ALBUMIN: 4.3 g/dL (ref 3.5–5.0)
ALT: 39 U/L (ref 14–54)
ANION GAP: 7 (ref 5–15)
AST: 31 U/L (ref 15–41)
Alkaline Phosphatase: 45 U/L (ref 38–126)
BILIRUBIN TOTAL: 0.2 mg/dL — AB (ref 0.3–1.2)
BUN: 9 mg/dL (ref 6–20)
CHLORIDE: 111 mmol/L (ref 101–111)
CO2: 22 mmol/L (ref 22–32)
Calcium: 9.3 mg/dL (ref 8.9–10.3)
Creatinine, Ser: 0.84 mg/dL (ref 0.44–1.00)
GFR calc Af Amer: >60 mL/min (ref >60)
GFR calc non Af Amer: >60 mL/min (ref >60)
GLUCOSE: 105 mg/dL — AB (ref 65–99)
POTASSIUM: 3.1 mmol/L — AB (ref 3.5–5.1)
SODIUM: 140 mmol/L (ref 135–145)
TOTAL PROTEIN: 7.6 g/dL (ref 6.5–8.1)

## 2017-06-07 LAB — URINALYSIS, MICROSCOPIC (REFLEX)

## 2017-06-07 LAB — LIPASE, BLOOD: LIPASE: 43 U/L (ref 11–51)

## 2017-06-07 LAB — PREGNANCY, URINE: PREG TEST UR: NEGATIVE

## 2017-06-07 MED ORDER — ONDANSETRON HCL 4 MG/2ML IJ SOLN: 4.0000 mg | Freq: Once | INTRAMUSCULAR | Status: DC | PRN

## 2017-06-07 NOTE — ED Triage Notes (Signed)
Pt c/o RLQ pain that started in her umbilical area two days ago and has progressed to the lower right side with nausea and diarrhea, denies fevers but states pain is worse with walking

## 2017-06-08 ENCOUNTER — Emergency Department (HOSPITAL_BASED_OUTPATIENT_CLINIC_OR_DEPARTMENT_OTHER)
Admission: EM | Admit: 2017-06-08 | Discharge: 2017-06-08 | Disposition: A | Discharge status: Home / Self Care | Payer: BLUE CROSS/BLUE SHIELD | Attending: Emergency Medicine | Admitting: Emergency Medicine

## 2017-06-08 ENCOUNTER — Emergency Department (HOSPITAL_BASED_OUTPATIENT_CLINIC_OR_DEPARTMENT_OTHER): Payer: BLUE CROSS/BLUE SHIELD

## 2017-06-08 DIAGNOSIS — R109 Unspecified abdominal pain: Secondary | ICD-10-CM | POA: Diagnosis not present

## 2017-06-08 DIAGNOSIS — R197 Diarrhea, unspecified: Secondary | ICD-10-CM | POA: Diagnosis not present

## 2017-06-08 DIAGNOSIS — R1031 Right lower quadrant pain: Secondary | ICD-10-CM

## 2017-06-08 MED ORDER — SODIUM CHLORIDE 0.9 % IV BOLUS (SEPSIS)
1000.0000 mL | Freq: Once | INTRAVENOUS | Status: AC
  Administered 2017-06-08: 1000 mL via INTRAVENOUS

## 2017-06-08 MED ORDER — MORPHINE SULFATE (PF) 4 MG/ML IV SOLN: 4.0000 mg | Freq: Once | INTRAVENOUS | Status: DC

## 2017-06-08 MED ORDER — ONDANSETRON 4 MG PO TBDP: ORAL_TABLET | ORAL | 0 refills | Status: DC

## 2017-06-08 MED ORDER — MORPHINE SULFATE (PF) 2 MG/ML IV SOLN
2.0000 mg | Freq: Once | INTRAVENOUS | Status: AC
  Administered 2017-06-08: 2 mg via INTRAVENOUS
  Filled 2017-06-08: qty 1

## 2017-06-08 MED ORDER — ONDANSETRON HCL 4 MG/2ML IJ SOLN
4.0000 mg | Freq: Once | INTRAMUSCULAR | Status: AC
  Administered 2017-06-08: 4 mg via INTRAVENOUS
  Filled 2017-06-08: qty 2

## 2017-06-08 MED ORDER — IOPAMIDOL (ISOVUE-300) INJECTION 61%
100.0000 mL | Freq: Once | INTRAVENOUS | Status: AC | PRN
  Administered 2017-06-08: 100 mL via INTRAVENOUS

## 2017-06-08 NOTE — Discharge Instructions (Signed)
Try to increase your miralax.  Return for fever, sudden worsening pain, inability to eat or drink.

## 2017-06-08 NOTE — ED Provider Notes (Signed)
[facility] EMERGENCY DEPARTMENT Provider Note   CSN: [id number] Arrival date & time: 06/07/17  2123     History   Chief Complaint Chief Complaint  Patient presents with  . Abdominal Pain    HPI Jessica [name] is a 21 y.o. female.  21 yo F with a chief complaint of abdominal pain.  This been going on for the past couple days.  Started about her umbilicus and then has moved to the right lower quadrant.  Worse every time she takes a step.  Has had some significant nausea but denies vomiting.  Has not felt like eating today.  No prior history of abdominal surgery.  No vaginal bleeding or discharge.  Last menstrual cycle was about 10 days ago.  She denies trauma.  Denies fevers.   The history is provided by the patient.  Abdominal Pain   This is a new problem. The current episode started 2 days ago. The problem occurs constantly. The problem has been gradually worsening. The pain is located in the RLQ. The quality of the pain is sharp and shooting. The pain is at a severity of 7/10. The pain is moderate. Associated symptoms include nausea. Pertinent negatives include fever, vomiting, dysuria, headaches, arthralgias and myalgias. The symptoms are aggravated by palpation (ambulating). Nothing relieves the symptoms.    Past Medical History:  Diagnosis Date  . Adjustment disorder with mixed anxiety and depressed mood   . Anorexia nervosa   . Autonomic dysfunction   . Migraine headache   . POTS (postural orthostatic tachycardia syndrome)   . Splenomegaly   . Syncope 09/22/2014  . Vasovagal syncope     Patient Active Problem List   Diagnosis Date Noted  . Autonomic dysfunction 05/07/2017  . Hematuria 03/22/2016  . Adjustment disorder with mixed anxiety and depressed mood 07/13/2015  . Chronic bilateral lower abdominal pain 05/24/2015  . Anorexia nervosa 04/29/2015  . Postural orthostatic tachycardia syndrome 09/26/2014  . Migraine without aura 10/30/2012  . Episodic  tension type headache 10/30/2012  . Orthostatic hypotension 10/30/2012    Past Surgical History:  Procedure Laterality Date  . ANTERIOR CRUCIATE LIGAMENT REPAIR  03/2010  . EYE SURGERY  2000   clogged tear duct  . WISDOM TOOTH EXTRACTION      OB History    No data available       Home Medications    Prior to Admission medications   Medication Sig Start Date End Date Taking? Authorizing Provider  atenolol (TENORMIN) 25 MG tablet Take 0.5 tablets (12.5 mg total) by mouth daily. 07/27/15   Tyrone Nine, MD  fludrocortisone (FLORINEF) 0.1 MG tablet Take 1 tablet by mouth daily 04/13/15   Owens Shark, MD  FLUoxetine (PROZAC) 40 MG capsule TAKE 1 CAPSULE (40 MG TOTAL) BY MOUTH DAILY. 12/04/16   Millican, Neysa Bonito, NP  fluticasone (FLONASE) 50 MCG/ACT nasal spray Place 2 sprays into both nostrils daily. 11/16/15   Christianne Dolin, NP  ibuprofen (ADVIL,MOTRIN) 800 MG tablet Take 1 tablet (800 mg total) by mouth every 8 (eight) hours as needed for mild pain or moderate pain. 12/10/15   Trixie Dredge, PA-C  JUNEL 1.5/30 1.5-30 MG-MCG tablet TAKE 1 TABLET BY MOUTH DAILY. 05/04/17   Porfirio Oar, PA-C  OLANZapine (ZYPREXA) 2.5 MG tablet TAKE 1/2 TABLET BY MOUTH AT BEDTIME. 12/11/16   Verneda Skill, FNP  ondansetron (ZOFRAN ODT) 4 MG disintegrating tablet 4mg  ODT q4 hours prn nausea/vomit 06/08/17   Melene Plan, DO  pantoprazole (  PROTONIX) 40 MG tablet  08/15/16   [provider]  promethazine (PHENERGAN) 25 MG tablet TAKE 1 TABLET BY MOUTH EVERY 8 HOURS AS NEEDED FOR NAUSEA OR VOMITING. Patient not taking: Reported on 05/30/2017 01/04/17   [name], [name], PA-C  QUDEXY XR 150 MG CS24 sprinkle capsule TAKE 1 CAPSULE BY MOUTH AT BEDTIME. 06/05/17   Elveria RisingGoodpasture, Tina, NP  SUMAtriptan (IMITREX) 25 MG tablet Take 1 tablet (25 mg total) by mouth every 2 (two) hours as needed for migraine. May repeat in 2 hours if headache persists or recurs. 05/09/17   Elveria RisingGoodpasture, Tina, NP    Family  History Family History  Problem Relation Age of Onset  . Migraines Mother        Started 4th or 5th grade  . Seizures Mother        Febrile Seizures as a child  . Seizures Father        Febrile Seizures as a child  . Hypertension Father   . Kidney Stones Father   . Migraines Maternal Aunt   . Migraines Maternal Uncle   . Diabetes Maternal Grandmother   . Stroke Maternal Grandfather        mini-stroke  . Cancer Maternal Grandfather        Bladder cancer, Died at 6368  . Diabetes Maternal Grandfather   . Mental retardation Other        Maternal Second Cousin  . Other Other        Maternal Great Uncle had some sort of Retinal Vessel Occlusion  . Breast cancer Maternal Aunt   . Cancer Maternal Aunt        Peritoneal CA  . Eating disorder Maternal Aunt   . Depression Unknown        Father's side of the family    Social History Social History   Tobacco Use  . Smoking status: Never Smoker  . Smokeless tobacco: Never Used  Substance Use Topics  . Alcohol use: Yes    Alcohol/week: 0.0 oz    Comment: Drinks on occasion   . Drug use: No     Allergies   Amoxicillin   Review of Systems Review of Systems  Constitutional: Negative for chills and fever.  HENT: Negative for congestion and rhinorrhea.   Eyes: Negative for redness and visual disturbance.  Respiratory: Negative for shortness of breath and wheezing.   Cardiovascular: Negative for chest pain and palpitations.  Gastrointestinal: Positive for abdominal pain and nausea. Negative for vomiting.  Genitourinary: Negative for dysuria and urgency.  Musculoskeletal: Negative for arthralgias and myalgias.  Skin: Negative for pallor and wound.  Neurological: Negative for dizziness and headaches.     Physical Exam Updated Vital Signs BP 115/71   Pulse 73   Temp 98.2 F (36.8 C) (Oral)   Resp 16   Ht 5\' 6"  (1.676 m)   Wt 59 kg (130 lb)   LMP 05/29/2017   SpO2 100%   BMI 20.98 kg/m   Physical Exam    Constitutional: She is oriented to person, place, and time. She appears well-developed and well-nourished. No distress.  HENT:  Head: Normocephalic and atraumatic.  Eyes: EOM are normal. Pupils are equal, round, and reactive to light.  Neck: Normal range of motion. Neck supple.  Cardiovascular: Normal rate and regular rhythm. Exam reveals no gallop and no friction rub.  No murmur heard. Pulmonary/Chest: Effort normal. She has no wheezes. She has no rales.  Abdominal: Soft. She exhibits no distension. There is  tenderness in the right lower quadrant. There is rebound and tenderness at McBurney's point.  + rosvigs  Musculoskeletal: She exhibits no edema or tenderness.  Neurological: She is alert and oriented to person, place, and time.  Skin: Skin is warm and dry. She is not diaphoretic.  Psychiatric: She has a normal mood and affect. Her behavior is normal.  Nursing note and vitals reviewed.    ED Treatments / Results  Labs (all labs ordered are listed, but only abnormal results are displayed) Labs Reviewed  URINALYSIS, ROUTINE W REFLEX MICROSCOPIC - Abnormal; Notable for the following components:      Result Value   Hgb urine dipstick TRACE (*)    Leukocytes, UA SMALL (*)    All other components within normal limits  COMPREHENSIVE METABOLIC PANEL - Abnormal; Notable for the following components:   Potassium 3.1 (*)    Glucose, Bld 105 (*)    Total Bilirubin 0.2 (*)    All other components within normal limits  URINALYSIS, MICROSCOPIC (REFLEX) - Abnormal; Notable for the following components:   Bacteria, UA RARE (*)    Squamous Epithelial / LPF 0-5 (*)    All other components within normal limits  PREGNANCY, URINE  LIPASE, BLOOD  CBC    EKG  EKG Interpretation None       Radiology Ct Abdomen Pelvis W Contrast  Result Date: 06/08/2017 CLINICAL DATA:  Acute onset right lower quadrant abdominal pain and periumbilical pain. Nausea and diarrhea. EXAM: CT ABDOMEN AND  PELVIS WITH CONTRAST TECHNIQUE: Multidetector CT imaging of the abdomen and pelvis was performed using the standard protocol following bolus administration of intravenous contrast. CONTRAST:  100mL ISOVUE-300 IOPAMIDOL (ISOVUE-300) INJECTION 61% COMPARISON:  CT of the abdomen and pelvis from 12/22/2016 FINDINGS: Lower chest: The visualized lung bases are grossly clear. The visualized portions of the mediastinum are unremarkable. Hepatobiliary: The liver is unremarkable in appearance. The gallbladder is unremarkable in appearance. The common bile duct remains normal in caliber. Pancreas: The pancreas is within normal limits. Spleen: The spleen is unremarkable in appearance. Adrenals/Urinary Tract: The adrenal glands are unremarkable in appearance. The kidneys are within normal limits. There is no evidence of hydronephrosis. No renal or ureteral stones are identified. No perinephric stranding is seen. Stomach/Bowel: The stomach is unremarkable in appearance. The small bowel is within normal limits. The appendix is normal in caliber, without evidence of appendicitis. The colon is unremarkable in appearance. Vascular/Lymphatic: The abdominal aorta is unremarkable in appearance. A circumaortic left renal vein is noted. The inferior vena cava is grossly unremarkable. No retroperitoneal lymphadenopathy is seen. No pelvic sidewall lymphadenopathy is identified. Reproductive: The bladder is mildly distended and grossly unremarkable. The uterus is unremarkable in appearance. The ovaries are relatively symmetric. No suspicious adnexal masses are seen. Other: No additional soft tissue abnormalities are seen. Musculoskeletal: No acute osseous abnormalities are identified. The visualized musculature is unremarkable in appearance. IMPRESSION: Unremarkable contrast-enhanced CT of the abdomen and pelvis. Electronically Signed   By: Roanna RaiderJeffery  Chang M.D.   On: 06/08/2017 02:44    Procedures Procedures (including critical care  time)  Medications Ordered in ED Medications  ondansetron (ZOFRAN) injection 4 mg (not administered)  ondansetron (ZOFRAN) injection 4 mg (4 mg Intravenous Given 06/08/17 0105)  sodium chloride 0.9 % bolus 1,000 mL (0 mLs Intravenous Stopped 06/08/17 0215)  iopamidol (ISOVUE-300) 61 % injection 100 mL (100 mLs Intravenous Contrast Given 06/08/17 0212)  morphine 2 MG/ML injection 2 mg (2 mg Intravenous Given 06/08/17 0105)  Initial Impression / Assessment and Plan / ED Course  I have reviewed the triage vital signs and the nursing notes.  Pertinent labs & imaging results that were available during my care of the patient were reviewed by me and considered in my medical decision making (see chart for details).     21 yo F with a chief complaint of the right lower quadrant abdominal pain.  Will CT to rule out appendicitis.  CT scan is negative for appendicitis.  On my viewing of the images the patient has a increased stool burden in the right middle to right upper quadrant.  She is now having pain more in the right middle to upper region.  She has a history of chronic constipation which she had taken herself off of MiraLAX.  Suggested that she resume this.  Return precautions given.  3:02 AM:  I have discussed the diagnosis/risks/treatment options with the patient and family and believe the pt to be eligible for discharge home to follow-up with PCP. We also discussed returning to the ED immediately if new or worsening sx occur. We discussed the sx which are most concerning (e.g., sudden worsening pain, fever, inability to tolerate by mouth) that necessitate immediate return. Medications administered to the patient during their visit and any new prescriptions provided to the patient are listed below.  Medications given during this visit Medications  ondansetron (ZOFRAN) injection 4 mg (not administered)  ondansetron (ZOFRAN) injection 4 mg (4 mg Intravenous Given 06/08/17 0105)  sodium  chloride 0.9 % bolus 1,000 mL (0 mLs Intravenous Stopped 06/08/17 0215)  iopamidol (ISOVUE-300) 61 % injection 100 mL (100 mLs Intravenous Contrast Given 06/08/17 0212)  morphine 2 MG/ML injection 2 mg (2 mg Intravenous Given 06/08/17 0105)     The patient appears reasonably screen and/or stabilized for discharge and I doubt any other medical condition or other [facility] requiring further screening, evaluation, or treatment in the ED at this time prior to discharge.    Final Clinical Impressions(s) / ED Diagnoses   Final diagnoses:  RLQ abdominal pain    ED Discharge Orders        Ordered    ondansetron (ZOFRAN ODT) 4 MG disintegrating tablet     06/08/17 0256       Melene Plan, DO 06/08/17 0302

## 2017-06-19 DIAGNOSIS — F419 Anxiety disorder, unspecified: Secondary | ICD-10-CM | POA: Diagnosis not present

## 2017-06-27 DIAGNOSIS — F419 Anxiety disorder, unspecified: Secondary | ICD-10-CM | POA: Diagnosis not present

## 2017-07-04 ENCOUNTER — Telehealth: Payer: Self-pay | Admitting: Physician Assistant

## 2017-07-04 ENCOUNTER — Other Ambulatory Visit: Payer: Self-pay | Admitting: Family

## 2017-07-04 ENCOUNTER — Other Ambulatory Visit (INDEPENDENT_AMBULATORY_CARE_PROVIDER_SITE_OTHER): Payer: Self-pay | Admitting: Family

## 2017-07-04 ENCOUNTER — Encounter: Payer: BLUE CROSS/BLUE SHIELD | Attending: Pediatrics | Admitting: *Deleted

## 2017-07-04 DIAGNOSIS — R630 Anorexia: Secondary | ICD-10-CM

## 2017-07-04 DIAGNOSIS — G43009 Migraine without aura, not intractable, without status migrainosus: Secondary | ICD-10-CM

## 2017-07-04 DIAGNOSIS — Z713 Dietary counseling and surveillance: Secondary | ICD-10-CM | POA: Diagnosis not present

## 2017-07-04 DIAGNOSIS — F509 Eating disorder, unspecified: Secondary | ICD-10-CM | POA: Insufficient documentation

## 2017-07-04 DIAGNOSIS — F5000 Anorexia nervosa, unspecified: Secondary | ICD-10-CM

## 2017-07-04 DIAGNOSIS — F419 Anxiety disorder, unspecified: Secondary | ICD-10-CM | POA: Diagnosis not present

## 2017-07-04 NOTE — Progress Notes (Signed)
ref

## 2017-07-04 NOTE — Progress Notes (Addendum)
Appointment start time: 1400  Appointment end time: 1445  Patient was seen on 07/04/17 for nutrition counseling pertaining to disordered eating.    Primary care provider: NA Therapist: Noni SaupeHeather Kitchen Any other medical team members: Dr. Karlene EinsteinPerry/Adolescent medicine NPs   Assessment Needs new PCP/medical team.  [facility]  [facility]  Trying to get into [facility] too.    Sleeping well.  napping about the same Having more headaches lately.  Napping more as a result Thirsty a lot.  Thinks she is drinking more. Nauseated sometimes.  Sometimes skips her snack because she doesn't feel good.   Reflux worse.  Will call GI No dizziness.  No syncope ED trip due to abdominal pain   Eats intuitively.   EAT-26 today insignificant   Physical activity: nothing structured   Growth Metrics: Current weight : 133; BMI: 22  Previous growth data: weight/age  55-90th%; height/age at 75%; BMI/age 71-85% Goal BMI range based on growth chart data: 22-25 Goal weight range based on growth chart data: 130-145 lb   Mental health diagnosis: AN, B/P subtype   Dietary assessment: mom says she's always been picky; currently following gluten-free diet  24 hour recall B: 2 waffles with nutella L: 8 grilled nuiggets, fruit cup.  Sprite S: latte.  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  As evidenced by dietary recall  Intervention/Goals:  Nutrition counseling provided.  Reiterated need for adult providers Keep it up.      Monitoring and Evaluation: Patient will follow up in 6 months.

## 2017-07-04 NOTE — Telephone Encounter (Signed)
Referral request was from another practice. Sent to Geanie Berlinristy Millican NP -[facility]

## 2017-07-04 NOTE — Telephone Encounter (Signed)
Copied from CRM [phone]. Topic: General - Other >> Jul 04, 2017  9:41 AM Stephannie LiSimmons, Cledith Kamiya L, NT wrote: [facility] for CRM[facility] ,needing a new referral please advise [phone] fax [phone] patient has an appointment today at 2:00 , please date for today

## 2017-07-11 DIAGNOSIS — F419 Anxiety disorder, unspecified: Secondary | ICD-10-CM | POA: Diagnosis not present

## 2017-07-25 DIAGNOSIS — F419 Anxiety disorder, unspecified: Secondary | ICD-10-CM | POA: Diagnosis not present

## 2017-07-26 ENCOUNTER — Encounter: Payer: Self-pay | Admitting: Family

## 2017-07-31 ENCOUNTER — Telehealth (INDEPENDENT_AMBULATORY_CARE_PROVIDER_SITE_OTHER): Payer: Self-pay | Admitting: Family

## 2017-07-31 DIAGNOSIS — G43009 Migraine without aura, not intractable, without status migrainosus: Secondary | ICD-10-CM

## 2017-07-31 IMAGING — MR MR ABDOMEN WO/W CM
7 of 14 series · 26 of 48 positions shown · IV contrast (6ml eovist)
Comparison: Ultrasound on 08/18/2016

CLINICAL DATA: Left-sided abdominal pain. Liver lesion on recent
ultrasound. On birth control pills.

EXAM:
MRI ABDOMEN WITHOUT AND WITH CONTRAST
TECHNIQUE: Multiplanar multisequence MR imaging of the abdomen was performed
both before and after the administration of intravenous contrast.
CONTRAST:  6mL EOVIST GADOXETATE DISODIUM 0.25 MOL/L IV SOLN

[Series 4: bSSFP · axial · 3.0mm · 0.74mm/px · z∈[-51,+126]mm · 4 of 60 slices shown]
[im 1/60]
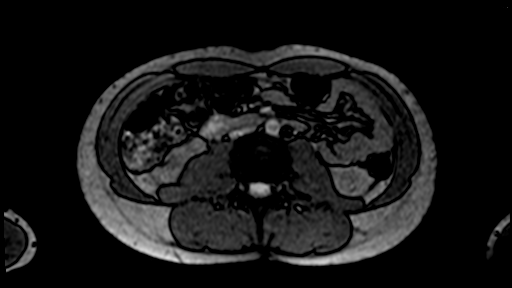
[im 20/60]
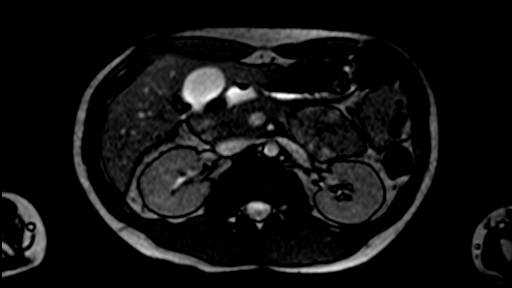
[im 40/60]
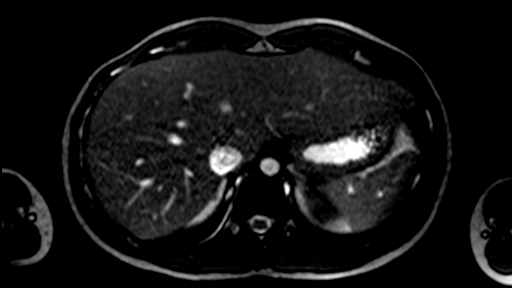
[im 60/60]
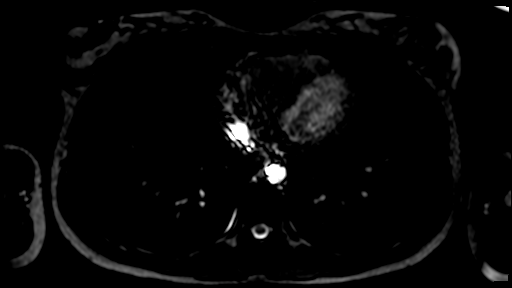

[Series 5: T1 · axial · 6.0mm · 0.74mm/px · z∈[-45,+135]mm · 3 of 52 slices shown]
[im 1/52]
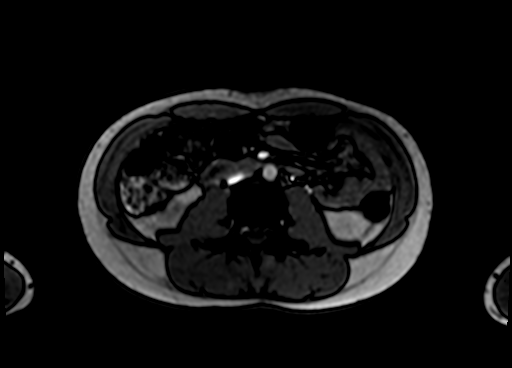
[im 26/52]
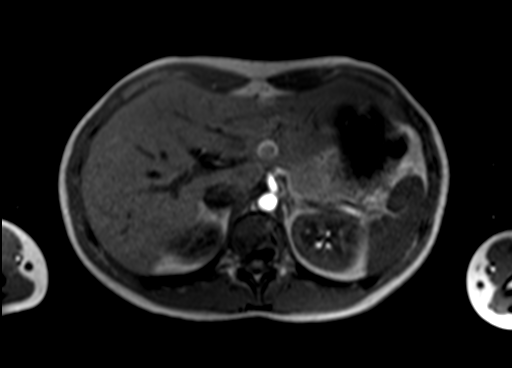
[im 52/52]
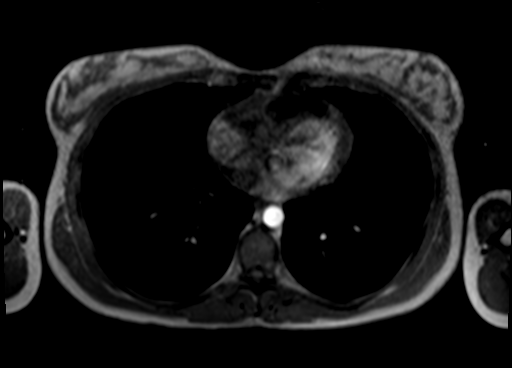

[Series 6: T1 dynamic · axial · non-contrast · 2.5mm · 0.78mm/px · z∈[-56,+142]mm · 4 of 80 slices shown]
[im 1/80]
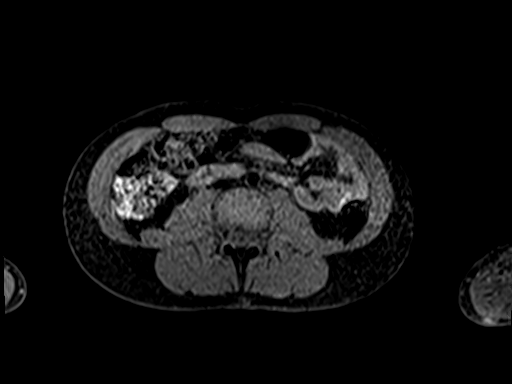
[im 27/80]
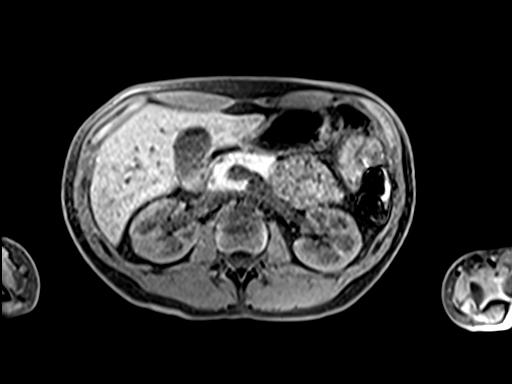
[im 53/80]
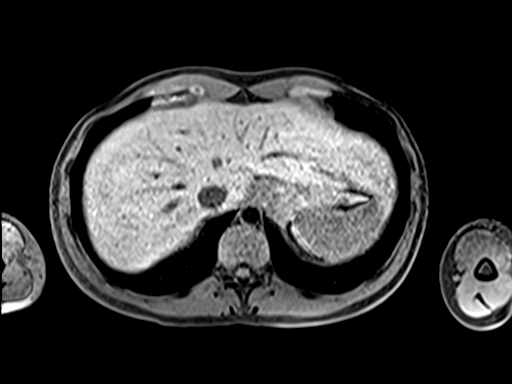
[im 80/80]
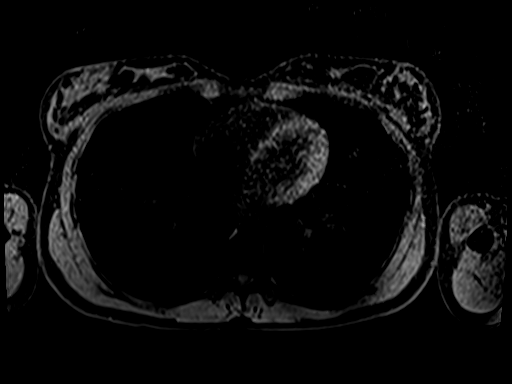

[Series 7: T1 dynamic post-contrast · axial · 2.5mm · 0.78mm/px · z∈[-56,+142]mm · 4 of 80 slices shown (1 of 4)]
[im 1/80]
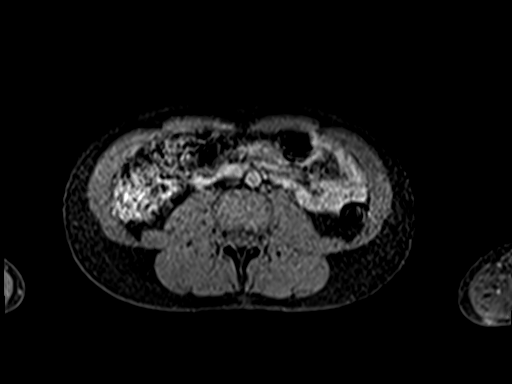
[im 27/80]
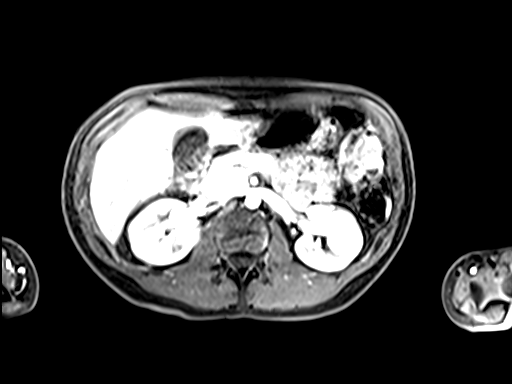
[im 53/80]
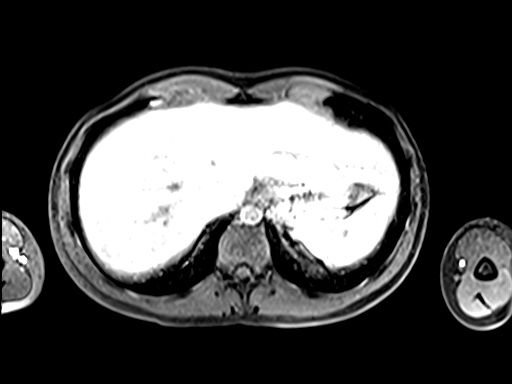
[im 80/80]
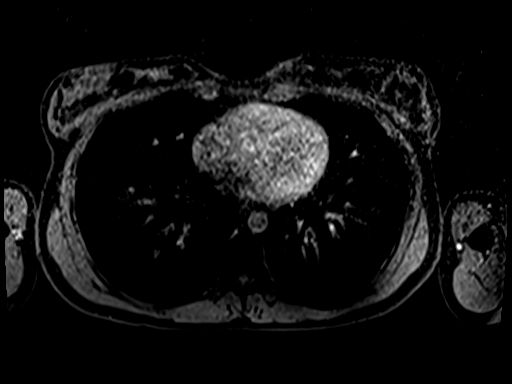

[Series 8: T1 dynamic post-contrast · axial · 2.5mm · 0.78mm/px · z∈[-56,+142]mm · 4 of 80 slices shown (2 of 4)]
[im 1/80]
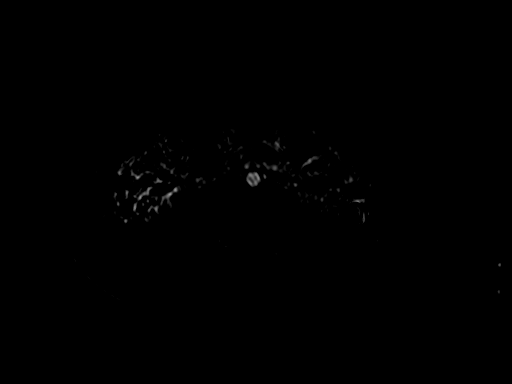
[im 27/80]
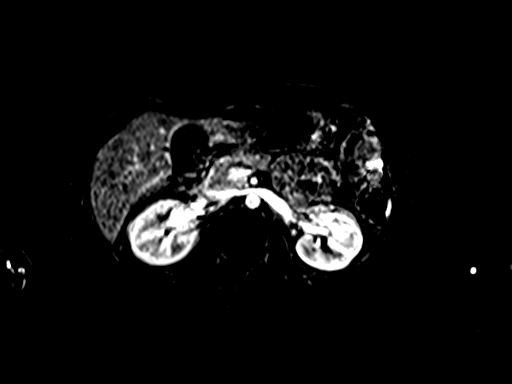
[im 53/80]
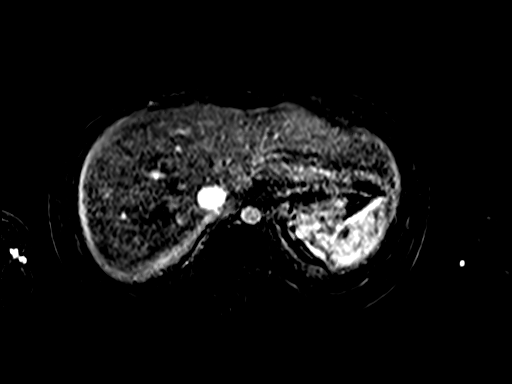
[im 80/80]
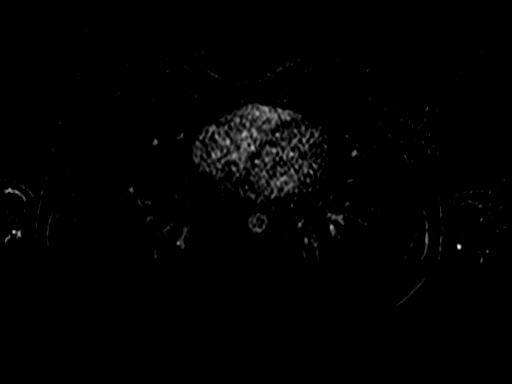

[Series 9: T1 dynamic post-contrast · axial · 2.5mm · 0.78mm/px · z∈[-56,+142]mm · 4 of 80 slices shown (3 of 4)]
[im 1/80]
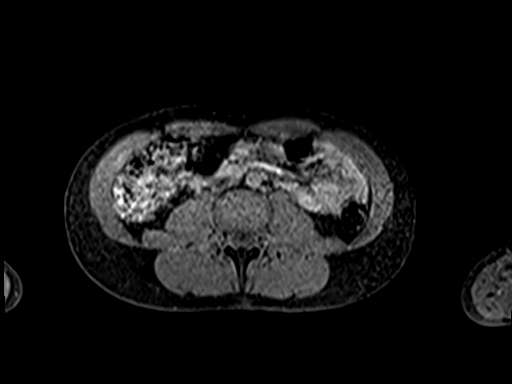
[im 27/80]
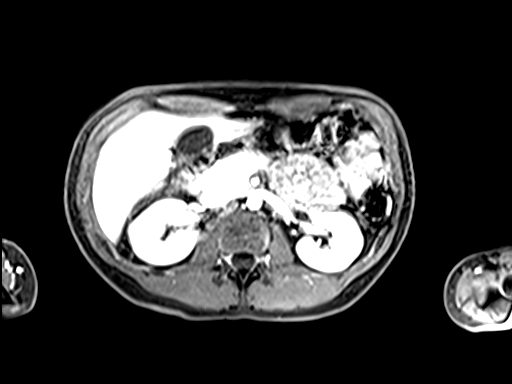
[im 53/80]
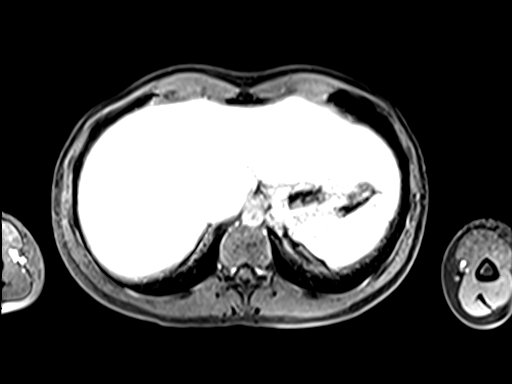
[im 80/80]
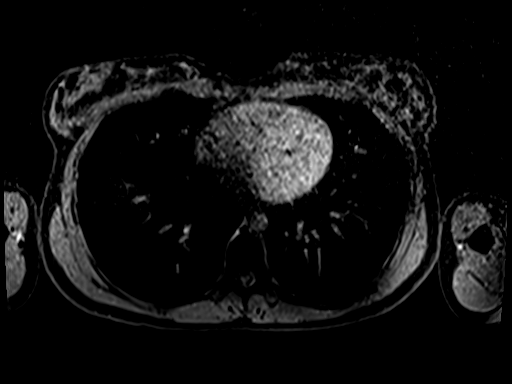

[Series 10: T1 dynamic post-contrast · axial · 2.5mm · 0.78mm/px · z∈[-56,+74]mm · 3 of 80 slices shown (4 of 4)]
[im 1/80]
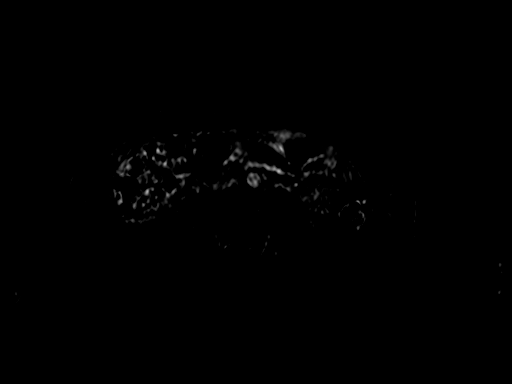
[im 27/80]
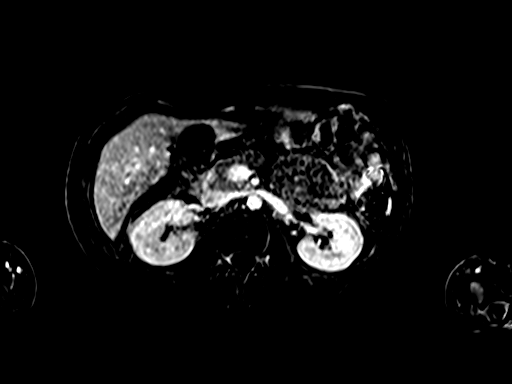
[im 53/80]
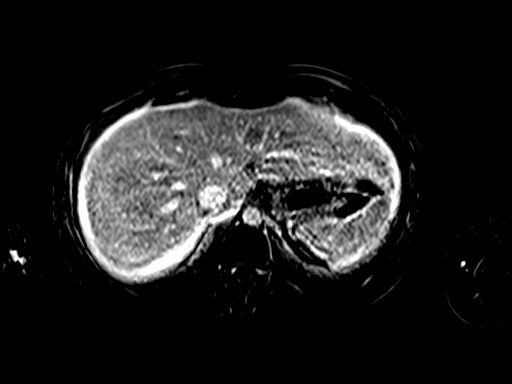

[26 of 48 positions shown; findings below may reference images not displayed]

FINDINGS: Lower chest: No acute findings.

Hepatobiliary: No masses identified. Tiny sub-cm hyperechoic lesion
seen on previous ultrasound is not visualized on today's study,
likely due to its small size. Gallbladder is unremarkable. No
evidence of biliary ductal dilatation.

Pancreas:  No mass or inflammatory changes.

Spleen:  Within normal limits in size and appearance.

Adrenals/Urinary Tract: No masses identified. No evidence of
hydronephrosis.

Stomach/Bowel: Visualized portions within the abdomen are
unremarkable.

Vascular/Lymphatic: No pathologically enlarged lymph nodes
identified. No abdominal aortic aneurysm.

Other:  None.

Musculoskeletal:  No suspicious bone lesions identified.
IMPRESSION: Negative. Tiny sub-cm hyperechoic lesion seen on previous ultrasound
is not visualized by MR, likely due to its small size . Consider
followup with right upper quadrant ultrasound in 6-12 months to
confirm stability.

## 2017-07-31 MED ORDER — TIZANIDINE HCL 2 MG PO TABS: ORAL_TABLET | ORAL | 0 refills | Status: DC

## 2017-07-31 NOTE — Telephone Encounter (Signed)
Spoke with Jessica [name] and she informed me that she has had a headache for two weeks now. She states that it is not her typical migraine and she does not know if it is a new headache. She states that she has tried tylenol and excedrin but finally broke down, and took her sumatriptan but it made her feel worse. She states that her family feels that she seems kind of dazed. She starts school in a week and wants to know what can be done.

## 2017-07-31 NOTE — Telephone Encounter (Signed)
I called and talked to Jessica [name]. She said that she developed a headache behind her eyes around Christmas and that the headache had persisted. She said that sometimes she had also pain in her face and sometimes she had pain all around her head. Jessica [name] has tried Ibuprofen and Tylenol without relief. She tried Sumatriptan last night but it made her feel worse. She said that her family told her that she was less interactive and seemed distracted, but that she had not experienced any loss of consciousness or other symptoms. I talked with Jessica [name] about the headache. This may be a severe prolonged tension headache. I recommended a trial of Tizandine and explained to her about the medication and how to take it. I asked her call me back in a few days if the headache persists. Jessica [name] agreed with the plans made today. TG

## 2017-07-31 NOTE — Telephone Encounter (Signed)
°  Who's calling (name and relationship to patient) : Jessica [name] (Self) Best contact number: [phone] Provider they see: Inetta Fermoina Reason for call: Per pt, has been having headaches for two weeks and would like to speak with Inetta Fermoina regarding this.

## 2017-08-01 DIAGNOSIS — F419 Anxiety disorder, unspecified: Secondary | ICD-10-CM | POA: Diagnosis not present

## 2017-08-03 ENCOUNTER — Telehealth (INDEPENDENT_AMBULATORY_CARE_PROVIDER_SITE_OTHER): Payer: Self-pay | Admitting: Family

## 2017-08-03 NOTE — Telephone Encounter (Signed)
°  Who's calling (name and relationship to patient) : Dorota (Self) Best contact number: [phone] Provider they see: Elveria Risingina Goodpasture Reason for call: Pt lvm wanting to let Inetta Fermoina know how her medication was working.

## 2017-08-03 NOTE — Telephone Encounter (Signed)
I called Jessica [name] back. She said that she was feeling a little better. She has been taking the Tizanidine and also got a neck and shoulder massage. I asked her to do gentle neck stretches and apply warmth to her neck. She agreed with these plans. TG

## 2017-08-15 DIAGNOSIS — F419 Anxiety disorder, unspecified: Secondary | ICD-10-CM | POA: Diagnosis not present

## 2017-08-21 DIAGNOSIS — F419 Anxiety disorder, unspecified: Secondary | ICD-10-CM | POA: Diagnosis not present

## 2017-08-28 DIAGNOSIS — F419 Anxiety disorder, unspecified: Secondary | ICD-10-CM | POA: Diagnosis not present

## 2017-09-05 DIAGNOSIS — F419 Anxiety disorder, unspecified: Secondary | ICD-10-CM | POA: Diagnosis not present

## 2017-09-12 ENCOUNTER — Encounter: Payer: Self-pay | Admitting: *Deleted

## 2017-09-12 DIAGNOSIS — F419 Anxiety disorder, unspecified: Secondary | ICD-10-CM | POA: Diagnosis not present

## 2017-09-13 DIAGNOSIS — R05 Cough: Secondary | ICD-10-CM | POA: Diagnosis not present

## 2017-09-19 DIAGNOSIS — F419 Anxiety disorder, unspecified: Secondary | ICD-10-CM | POA: Diagnosis not present

## 2017-10-01 DIAGNOSIS — Z713 Dietary counseling and surveillance: Secondary | ICD-10-CM | POA: Diagnosis not present

## 2017-10-03 DIAGNOSIS — F419 Anxiety disorder, unspecified: Secondary | ICD-10-CM | POA: Diagnosis not present

## 2017-10-10 DIAGNOSIS — F419 Anxiety disorder, unspecified: Secondary | ICD-10-CM | POA: Diagnosis not present

## 2017-10-11 ENCOUNTER — Other Ambulatory Visit: Payer: Self-pay | Admitting: Family

## 2017-10-11 DIAGNOSIS — F5000 Anorexia nervosa, unspecified: Secondary | ICD-10-CM

## 2017-10-11 DIAGNOSIS — F4323 Adjustment disorder with mixed anxiety and depressed mood: Secondary | ICD-10-CM

## 2017-10-22 DIAGNOSIS — Z713 Dietary counseling and surveillance: Secondary | ICD-10-CM | POA: Diagnosis not present

## 2017-10-24 DIAGNOSIS — F419 Anxiety disorder, unspecified: Secondary | ICD-10-CM | POA: Diagnosis not present

## 2017-10-25 ENCOUNTER — Encounter: Payer: Self-pay | Admitting: Physician Assistant

## 2017-11-05 DIAGNOSIS — Z713 Dietary counseling and surveillance: Secondary | ICD-10-CM | POA: Diagnosis not present

## 2017-11-06 DIAGNOSIS — B348 Other viral infections of unspecified site: Secondary | ICD-10-CM | POA: Diagnosis not present

## 2017-11-07 ENCOUNTER — Ambulatory Visit (INDEPENDENT_AMBULATORY_CARE_PROVIDER_SITE_OTHER): Payer: BLUE CROSS/BLUE SHIELD | Admitting: Family

## 2017-11-07 ENCOUNTER — Encounter: Payer: Self-pay | Admitting: Family

## 2017-11-07 VITALS — BP 101/69 | HR 99 | Temp 99.3°F | Ht 65.55 in | Wt 126.8 lb

## 2017-11-07 DIAGNOSIS — F4323 Adjustment disorder with mixed anxiety and depressed mood: Secondary | ICD-10-CM | POA: Diagnosis not present

## 2017-11-07 DIAGNOSIS — Z1389 Encounter for screening for other disorder: Secondary | ICD-10-CM | POA: Diagnosis not present

## 2017-11-07 DIAGNOSIS — F5000 Anorexia nervosa, unspecified: Secondary | ICD-10-CM | POA: Diagnosis not present

## 2017-11-07 LAB — POCT URINALYSIS DIPSTICK
BILIRUBIN UA: NEGATIVE
GLUCOSE UA: NEGATIVE
KETONES UA: NEGATIVE
Nitrite, UA: NEGATIVE
PH UA: 7 (ref 5.0–8.0)
SPEC GRAV UA: 1.02 (ref 1.010–1.025)
UROBILINOGEN UA: NEGATIVE U/dL — AB

## 2017-11-07 MED ORDER — PHENAZOPYRIDINE HCL 200 MG PO TABS: 200.0000 mg | ORAL_TABLET | Freq: Three times a day (TID) | ORAL | 0 refills | Status: DC | PRN

## 2017-11-07 MED ORDER — NITROFURANTOIN MONOHYD MACRO 100 MG PO CAPS: 100.0000 mg | ORAL_CAPSULE | Freq: Two times a day (BID) | ORAL | 0 refills | Status: DC

## 2017-11-07 NOTE — Patient Instructions (Signed)
Schedule an appointment with GI for stomach concerns.  Take medications as prescribed from today.  If fever, vomiting, flank pain, or new or worsening symptoms, please seek care immediately.

## 2017-11-07 NOTE — Progress Notes (Signed)
THIS RECORD MAY CONTAIN CONFIDENTIAL INFORMATION THAT SHOULD NOT BE RELEASED WITHOUT REVIEW OF THE SERVICE PROVIDER.  Adolescent Medicine Consultation Follow-Up Visit Jessica [name]  is a 22 y.o. female referred by [name], [name], PA-C here today for follow-up regarding urinary symptoms, fatigue.    Last seen in [facility] Clinic on 05/30/17 for DE, anxiety adjustment disorder with mixed anxiety/depressed mood.  Plan at last visit included Prozac, no Zyprexa. Miralax for constipation.   Pertinent Labs? No Growth Chart Viewed? no   History was provided by the patient.  Interpreter? no  PCP Confirmed?  yes  My Chart Activated?   no   Chief Complaint  Patient presents with  . Follow-up  . Eating Disorder    HPI:    Jessica FlesherWent to urgent care last night - body aches  Working night shift Neuro ICU at [employer] Graduating from nursing school in 21 days Negative for flu, but they said she likely had flu  Aches started Saturday night; on and off fever through Saturday  No fever since then  Nausea, no vomiting.  Did clean out Miralax on Monday afternoon   Meds:  No zyprex - been about 6 months off  Prozac, atenolol - taking as prescribed.   Junel birth control.  Having some urinary frequency and burning x 2-3 days.    Had diarrhea for a month straight. Then was constipated.   Review of Systems  Constitutional: Negative for malaise/fatigue.  Eyes: Negative for double vision.  Respiratory: Negative for shortness of breath.   Cardiovascular: Negative for chest pain and palpitations.  Gastrointestinal: Negative for abdominal pain, constipation, diarrhea, nausea and vomiting.  Genitourinary: Negative for dysuria.  Musculoskeletal: Negative for joint pain and myalgias.  Skin: Negative for rash.  Neurological: Negative for dizziness and headaches.  Endo/Heme/Allergies: Does not bruise/bleed easily.  Psychiatric/Behavioral: Negative for depression and suicidal ideas. The  patient is not nervous/anxious.    No LMP recorded. Allergies  Allergen Reactions  . Amoxicillin Rash   Outpatient Medications Prior to Visit  Medication Sig Dispense Refill  . atenolol (TENORMIN) 25 MG tablet Take 0.5 tablets (12.5 mg total) by mouth daily.    . fludrocortisone (FLORINEF) 0.1 MG tablet Take 1 tablet by mouth daily 90 tablet 5  . FLUoxetine (PROZAC) 40 MG capsule TAKE 1 CAPSULE BY MOUTH DAILY. 30 capsule 3  . fluticasone (FLONASE) 50 MCG/ACT nasal spray Place 2 sprays into both nostrils daily. 16 g 12  . ibuprofen (ADVIL,MOTRIN) 800 MG tablet Take 1 tablet (800 mg total) by mouth every 8 (eight) hours as needed for mild pain or moderate pain. 15 tablet 0  . JUNEL 1.5/30 1.5-30 MG-MCG tablet TAKE 1 TABLET BY MOUTH DAILY. 84 tablet 3  . ondansetron (ZOFRAN ODT) 4 MG disintegrating tablet 4mg  ODT q4 hours prn nausea/vomit 20 tablet 0  . pantoprazole (PROTONIX) 40 MG tablet     . SUMAtriptan (IMITREX) 25 MG tablet Take 1 tablet (25 mg total) by mouth every 2 (two) hours as needed for migraine. May repeat in 2 hours if headache persists or recurs. 10 tablet 5  . OLANZapine (ZYPREXA) 2.5 MG tablet TAKE 1/2 TABLET BY MOUTH AT BEDTIME. (Patient not taking: Reported on 11/07/2017) 30 tablet 1  . promethazine (PHENERGAN) 25 MG tablet TAKE 1 TABLET BY MOUTH EVERY 8 HOURS AS NEEDED FOR NAUSEA OR VOMITING. (Patient not taking: Reported on 11/07/2017) 20 tablet 0  . QUDEXY XR 150 MG CS24 sprinkle capsule TAKE 1 CAPSULE BY MOUTH AT BEDTIME (  Patient not taking: Reported on 11/07/2017) 30 each 0  . tiZANidine (ZANAFLEX) 2 MG tablet Take 1 tablet at bedtime for headache. May take additional 1/2 to 1 tablet every 8 hours as needed for pain (Patient not taking: Reported on 11/07/2017) 20 tablet 0   No facility-administered medications prior to visit.      Patient Active Problem List   Diagnosis Date Noted  . Autonomic dysfunction 05/07/2017  . Hematuria 03/22/2016  . Adjustment disorder with  mixed anxiety and depressed mood 07/13/2015  . Chronic bilateral lower abdominal pain 05/24/2015  . Anorexia nervosa 04/29/2015  . Postural orthostatic tachycardia syndrome 09/26/2014  . Migraine without aura 10/30/2012  . Episodic tension type headache 10/30/2012  . Orthostatic hypotension 10/30/2012  Physical Exam:  Vitals:   11/07/17 1342  BP: 101/69  Pulse: 99  Weight: 126 lb 12.8 oz (57.5 kg)  Height: 5' 5.55" (1.665 m)   BP 101/69   Pulse 99   Ht 5' 5.55" (1.665 m)   Wt 126 lb 12.8 oz (57.5 kg)   BMI 20.75 kg/m  Body mass index: body mass index is 20.75 kg/m. Growth percentile SmartLinks can only be used for patients less than 69 years old.  Wt Readings from Last 3 Encounters:  11/07/17 126 lb 12.8 oz (57.5 kg)  06/07/17 130 lb (59 kg)  05/30/17 130 lb (59 kg)    Physical Exam  Constitutional: She is oriented to person, place, and time. She appears well-developed. No distress.  HENT:  Head: Normocephalic and atraumatic.  Eyes: Pupils are equal, round, and reactive to light. EOM are normal. No scleral icterus.  Neck: Normal range of motion.  Cardiovascular: Normal rate and intact distal pulses.  No murmur heard. Pulmonary/Chest: Effort normal and breath sounds normal.  Abdominal: Soft.  Musculoskeletal: Normal range of motion. She exhibits no edema.  Lymphadenopathy:    She has no cervical adenopathy.  Neurological: She is alert and oriented to person, place, and time.  Skin: Skin is warm and dry. No rash noted.  Psychiatric: She has a normal mood and affect.   Assessment/Plan: 1. Anorexia nervosa -her weight is down 4 lbs today -in the context of recent GI upset, recommend follow up with GI.  -consider cleanout vs anxious stomach (?) -continue with nutrition   2. Adjustment disorder with mixed anxiety and depressed mood -continue prozac 10 mg   3. Screening for genitourinary condition -mod leuks, negative nitrites, trace blood.  -will tx since  symptomatic, culture sent - POCT urinalysis dipstick - Urine Culture  Follow-up: 12/05/17    Medical decision-making:  >15 minutes spent face to face with patient with more than 50% of appointment spent discussing diagnosis, management, follow-up, and reviewing need for GI referral, AN follow up with DE. Advised patient that we will continue to see patients through 22 years old here.

## 2017-11-08 LAB — URINE CULTURE
MICRO NUMBER:: 90473066
SPECIMEN QUALITY:: ADEQUATE

## 2017-11-12 ENCOUNTER — Encounter: Payer: Self-pay | Admitting: Family

## 2017-11-14 DIAGNOSIS — F419 Anxiety disorder, unspecified: Secondary | ICD-10-CM | POA: Diagnosis not present

## 2017-11-21 DIAGNOSIS — F419 Anxiety disorder, unspecified: Secondary | ICD-10-CM | POA: Diagnosis not present

## 2017-11-28 DIAGNOSIS — F419 Anxiety disorder, unspecified: Secondary | ICD-10-CM | POA: Diagnosis not present

## 2017-11-30 DIAGNOSIS — D2272 Melanocytic nevi of left lower limb, including hip: Secondary | ICD-10-CM | POA: Diagnosis not present

## 2017-11-30 DIAGNOSIS — D2262 Melanocytic nevi of left upper limb, including shoulder: Secondary | ICD-10-CM | POA: Diagnosis not present

## 2017-11-30 DIAGNOSIS — D225 Melanocytic nevi of trunk: Secondary | ICD-10-CM | POA: Diagnosis not present

## 2017-11-30 DIAGNOSIS — D224 Melanocytic nevi of scalp and neck: Secondary | ICD-10-CM | POA: Diagnosis not present

## 2017-12-03 DIAGNOSIS — Z713 Dietary counseling and surveillance: Secondary | ICD-10-CM | POA: Diagnosis not present

## 2017-12-05 ENCOUNTER — Ambulatory Visit: Payer: BLUE CROSS/BLUE SHIELD | Admitting: Family

## 2017-12-12 ENCOUNTER — Ambulatory Visit (INDEPENDENT_AMBULATORY_CARE_PROVIDER_SITE_OTHER): Payer: BLUE CROSS/BLUE SHIELD | Admitting: Family

## 2017-12-12 ENCOUNTER — Encounter: Payer: Self-pay | Admitting: Family

## 2017-12-12 VITALS — BP 103/68 | HR 83 | Ht 65.75 in | Wt 129.8 lb

## 2017-12-12 DIAGNOSIS — Z1389 Encounter for screening for other disorder: Secondary | ICD-10-CM

## 2017-12-12 DIAGNOSIS — F5000 Anorexia nervosa, unspecified: Secondary | ICD-10-CM

## 2017-12-12 DIAGNOSIS — F4323 Adjustment disorder with mixed anxiety and depressed mood: Secondary | ICD-10-CM

## 2017-12-12 DIAGNOSIS — F419 Anxiety disorder, unspecified: Secondary | ICD-10-CM | POA: Diagnosis not present

## 2017-12-12 LAB — POCT URINALYSIS DIPSTICK
Bilirubin, UA: NEGATIVE
Blood, UA: NEGATIVE
Glucose, UA: NEGATIVE
Ketones, UA: NEGATIVE
LEUKOCYTES UA: NEGATIVE
NITRITE UA: NEGATIVE
PROTEIN UA: NEGATIVE
Spec Grav, UA: 1.01 (ref 1.010–1.025)
Urobilinogen, UA: NEGATIVE E.U./dL — AB
pH, UA: 7 (ref 5.0–8.0)

## 2017-12-12 NOTE — Progress Notes (Signed)
THIS RECORD MAY CONTAIN CONFIDENTIAL INFORMATION THAT SHOULD NOT BE RELEASED WITHOUT REVIEW OF THE SERVICE PROVIDER.  Adolescent Medicine Consultation Follow-Up Visit Jessica [name]  is a 22 y.o. female referred by Porfirio Oar, PA-C here today for follow-up regarding adjustment disorder with mixed anxiety and depressed mood, anorexia nervosa.   Last seen in Adolescent Medicine Clinic on 11/07/17 for same.  Plan at last visit included:  Continue prozac 10 mg  Treated with Macrobid for urinary symptoms   Pertinent Labs? No Growth Chart Viewed? no   History was provided by the patient.  Interpreter? no  PCP Confirmed?  yes  My Chart Activated?   yes    Chief Complaint  Patient presents with  . Follow-up  . Eating Disorder    HPI:   -doing well  -graduated nursing school  -has been studying for boards -no urinary symptoms  -no missed doses of prozac -feels her GI symptoms have improved lately   Review of Systems  Constitutional: Negative for malaise/fatigue.  Eyes: Negative for double vision.  Respiratory: Negative for shortness of breath.   Cardiovascular: Negative for chest pain and palpitations.  Gastrointestinal: Negative for abdominal pain, constipation, diarrhea, nausea and vomiting.  Genitourinary: Negative for dysuria.  Musculoskeletal: Negative for joint pain and myalgias.  Skin: Negative for rash.  Neurological: Negative for dizziness and headaches.  Endo/Heme/Allergies: Does not bruise/bleed easily.   No LMP recorded. Allergies  Allergen Reactions  . Amoxicillin Rash   Outpatient Medications Prior to Visit  Medication Sig Dispense Refill  . atenolol (TENORMIN) 25 MG tablet Take 0.5 tablets (12.5 mg total) by mouth daily.    . fludrocortisone (FLORINEF) 0.1 MG tablet Take 1 tablet by mouth daily 90 tablet 5  . FLUoxetine (PROZAC) 40 MG capsule TAKE 1 CAPSULE BY MOUTH DAILY. 30 capsule 3  . fluticasone (FLONASE) 50 MCG/ACT nasal spray Place 2 sprays  into both nostrils daily. 16 g 12  . ibuprofen (ADVIL,MOTRIN) 800 MG tablet Take 1 tablet (800 mg total) by mouth every 8 (eight) hours as needed for mild pain or moderate pain. 15 tablet 0  . JUNEL 1.5/30 1.5-30 MG-MCG tablet TAKE 1 TABLET BY MOUTH DAILY. 84 tablet 3  . pantoprazole (PROTONIX) 40 MG tablet     . SUMAtriptan (IMITREX) 25 MG tablet Take 1 tablet (25 mg total) by mouth every 2 (two) hours as needed for migraine. May repeat in 2 hours if headache persists or recurs. 10 tablet 5  . nitrofurantoin, macrocrystal-monohydrate, (MACROBID) 100 MG capsule Take 1 capsule (100 mg total) by mouth 2 (two) times daily. (Patient not taking: Reported on 12/12/2017) 10 capsule 0  . OLANZapine (ZYPREXA) 2.5 MG tablet TAKE 1/2 TABLET BY MOUTH AT BEDTIME. (Patient not taking: Reported on 11/07/2017) 30 tablet 1  . ondansetron (ZOFRAN ODT) 4 MG disintegrating tablet  ODT q4 hours prn nausea/vomit (Patient not taking: Reported on 12/12/2017) 20 tablet 0  . phenazopyridine (PYRIDIUM) 200 MG tablet Take 1 tablet (200 mg total) by mouth 3 (three) times daily as needed for pain. (Patient not taking: Reported on 12/12/2017) 10 tablet 0  . promethazine (PHENERGAN) 25 MG tablet TAKE 1 TABLET BY MOUTH EVERY 8 HOURS AS NEEDED FOR NAUSEA OR VOMITING. (Patient not taking: Reported on 11/07/2017) 20 tablet 0  . QUDEXY XR 150 MG CS24 sprinkle capsule TAKE 1 CAPSULE BY MOUTH AT BEDTIME (Patient not taking: Reported on 11/07/2017) 30 each 0  . tiZANidine (ZANAFLEX) 2 MG tablet Take 1 tablet at bedtime for  headache. May take additional 1/2 to 1 tablet every 8 hours as needed for pain (Patient not taking: Reported on 11/07/2017) 20 tablet 0   No facility-administered medications prior to visit.      Patient Active Problem List   Diagnosis Date Noted  . Autonomic dysfunction 05/07/2017  . Hematuria 03/22/2016  . Adjustment disorder with mixed anxiety and depressed mood 07/13/2015  . Chronic bilateral lower abdominal pain  05/24/2015  . Anorexia nervosa 04/29/2015  . Postural orthostatic tachycardia syndrome 09/26/2014  . Migraine without aura 10/30/2012  . Episodic tension type headache 10/30/2012  . Orthostatic hypotension 10/30/2012    Physical Exam:  Vitals:   12/12/17 1034  BP: 103/68  Pulse: 83  Weight: 129 lb 12.8 oz (58.9 kg)  Height: 5' 5.75" (1.67 m)   BP 103/68   Pulse 83   Ht 5' 5.75" (1.67 m)   Wt 129 lb 12.8 oz (58.9 kg)   BMI 21.11 kg/m  Body mass index: body mass index is 21.11 kg/m. Growth percentile SmartLinks can only be used for patients less than 83 years old. Wt Readings from Last 3 Encounters:  12/12/17 129 lb 12.8 oz (58.9 kg)  11/07/17 126 lb 12.8 oz (57.5 kg)  06/07/17 130 lb (59 kg)   Physical Exam  Constitutional: She appears well-developed. No distress.  HENT:  Head: Normocephalic and atraumatic.  Neck: Normal range of motion. Neck supple.  Cardiovascular: Normal rate and regular rhythm.  No murmur heard. Pulmonary/Chest: Effort normal.  Musculoskeletal: She exhibits no edema.  Lymphadenopathy:    She has no cervical adenopathy.  Neurological: She is alert.  Skin: Skin is warm and dry. No rash noted.   Assessment/Plan: 1. Adjustment disorder with mixed anxiety and depressed mood -continue with prozac 10 mg  -return precautions given    2. Anorexia nervosa Wt Readings from Last 3 Encounters:  12/12/17 129 lb 12.8 oz (58.9 kg)  11/07/17 126 lb 12.8 oz (57.5 kg)  06/07/17 130 lb (59 kg)  -stable; restored from last OV  3. Screening for genitourinary condition -negative - POCT urinalysis dipstick  Follow-up: 2 months or sooner as needed     Medical decision-making:  >15 minutes spent face to face with patient with more than 50% of appointment spent discussing diagnosis, reviewing of medication management, return precautions.

## 2017-12-16 DIAGNOSIS — J0100 Acute maxillary sinusitis, unspecified: Secondary | ICD-10-CM | POA: Diagnosis not present

## 2017-12-16 DIAGNOSIS — R062 Wheezing: Secondary | ICD-10-CM | POA: Diagnosis not present

## 2017-12-16 DIAGNOSIS — J209 Acute bronchitis, unspecified: Secondary | ICD-10-CM | POA: Diagnosis not present

## 2017-12-16 DIAGNOSIS — J0110 Acute frontal sinusitis, unspecified: Secondary | ICD-10-CM | POA: Diagnosis not present

## 2017-12-19 DIAGNOSIS — F419 Anxiety disorder, unspecified: Secondary | ICD-10-CM | POA: Diagnosis not present

## 2017-12-21 ENCOUNTER — Other Ambulatory Visit (INDEPENDENT_AMBULATORY_CARE_PROVIDER_SITE_OTHER): Payer: Self-pay | Admitting: Family

## 2017-12-21 DIAGNOSIS — G43009 Migraine without aura, not intractable, without status migrainosus: Secondary | ICD-10-CM

## 2017-12-26 DIAGNOSIS — F419 Anxiety disorder, unspecified: Secondary | ICD-10-CM | POA: Diagnosis not present

## 2018-01-02 ENCOUNTER — Ambulatory Visit: Payer: BLUE CROSS/BLUE SHIELD | Admitting: *Deleted

## 2018-01-07 ENCOUNTER — Encounter: Payer: Self-pay | Admitting: Family

## 2018-01-07 ENCOUNTER — Other Ambulatory Visit: Payer: Self-pay | Admitting: Pediatrics

## 2018-01-07 DIAGNOSIS — F4323 Adjustment disorder with mixed anxiety and depressed mood: Secondary | ICD-10-CM

## 2018-01-07 DIAGNOSIS — F5000 Anorexia nervosa, unspecified: Secondary | ICD-10-CM

## 2018-01-07 MED ORDER — FLUOXETINE HCL 40 MG PO CAPS: ORAL_CAPSULE | ORAL | 3 refills | Status: DC

## 2018-01-08 DIAGNOSIS — F419 Anxiety disorder, unspecified: Secondary | ICD-10-CM | POA: Diagnosis not present

## 2018-01-29 DIAGNOSIS — F419 Anxiety disorder, unspecified: Secondary | ICD-10-CM | POA: Diagnosis not present

## 2018-02-22 DIAGNOSIS — J039 Acute tonsillitis, unspecified: Secondary | ICD-10-CM | POA: Diagnosis not present

## 2018-03-08 ENCOUNTER — Telehealth: Payer: Self-pay

## 2018-03-08 NOTE — Telephone Encounter (Signed)
Yes, 30 min when possible.  Thanks.  

## 2018-03-08 NOTE — Telephone Encounter (Signed)
Copied from CRM [id number]. Topic: Appointment Scheduling - New Patient >> Mar 08, 2018 10:45 AM Alexander BergeronBarksdale, Jessica [name] apparently informed the pt's fiance ([id number](MRN:[id number]) that when she called that she would be added as a new pt; just needs to inform Dr. Para [name]; contact to schedule

## 2018-03-11 NOTE — Telephone Encounter (Signed)
Appointment made and pt advised-ah

## 2018-03-22 ENCOUNTER — Ambulatory Visit (INDEPENDENT_AMBULATORY_CARE_PROVIDER_SITE_OTHER): Payer: BLUE CROSS/BLUE SHIELD | Admitting: Family Medicine

## 2018-03-22 ENCOUNTER — Encounter: Payer: Self-pay | Admitting: Family Medicine

## 2018-03-22 VITALS — BP 102/62 | HR 99 | Temp 98.4°F | Ht 65.75 in | Wt 130.0 lb

## 2018-03-22 DIAGNOSIS — I951 Orthostatic hypotension: Secondary | ICD-10-CM

## 2018-03-22 DIAGNOSIS — G43009 Migraine without aura, not intractable, without status migrainosus: Secondary | ICD-10-CM | POA: Diagnosis not present

## 2018-03-22 DIAGNOSIS — R198 Other specified symptoms and signs involving the digestive system and abdomen: Secondary | ICD-10-CM

## 2018-03-22 DIAGNOSIS — F5000 Anorexia nervosa, unspecified: Secondary | ICD-10-CM | POA: Diagnosis not present

## 2018-03-22 DIAGNOSIS — G90A Postural orthostatic tachycardia syndrome (POTS): Secondary | ICD-10-CM

## 2018-03-22 DIAGNOSIS — Z7189 Other specified counseling: Secondary | ICD-10-CM

## 2018-03-22 DIAGNOSIS — R000 Tachycardia, unspecified: Secondary | ICD-10-CM

## 2018-03-22 LAB — COMPREHENSIVE METABOLIC PANEL
ALT: 27 U/L (ref 0–35)
AST: 18 U/L (ref 0–37)
Albumin: 4.7 g/dL (ref 3.5–5.2)
Alkaline Phosphatase: 41 U/L (ref 39–117)
BILIRUBIN TOTAL: 0.3 mg/dL (ref 0.2–1.2)
BUN: 13 mg/dL (ref 6–23)
CO2: 26 mEq/L (ref 19–32)
CREATININE: 0.88 mg/dL (ref 0.40–1.20)
Calcium: 9.9 mg/dL (ref 8.4–10.5)
Chloride: 107 mEq/L (ref 96–112)
GFR: 84.8 mL/min (ref >60.00)
GLUCOSE: 110 mg/dL — AB (ref 70–99)
Potassium: 4.4 mEq/L (ref 3.5–5.1)
Sodium: 139 mEq/L (ref 135–145)
Total Protein: 7.6 g/dL (ref 6.0–8.3)

## 2018-03-22 LAB — CBC WITH DIFFERENTIAL/PLATELET
Basophils Absolute: 0.1 K/uL (ref 0.0–0.1)
Basophils Relative: 2 % (ref 0.0–3.0)
Eosinophils Absolute: 0.2 K/uL (ref 0.0–0.7)
Eosinophils Relative: 2.4 % (ref 0.0–5.0)
HCT: 43.4 % (ref 36.0–46.0)
Hemoglobin: 14.4 g/dL (ref 12.0–15.0)
Lymphocytes Relative: 30.4 % (ref 12.0–46.0)
Lymphs Abs: 2 K/uL (ref 0.7–4.0)
MCHC: 33.2 g/dL (ref 30.0–36.0)
MCV: 92.2 fl (ref 78.0–100.0)
Monocytes Absolute: 0.5 K/uL (ref 0.1–1.0)
Monocytes Relative: 7.6 % (ref 3.0–12.0)
Neutro Abs: 3.8 K/uL (ref 1.4–7.7)
Neutrophils Relative %: 57.6 % (ref 43.0–77.0)
Platelets: 291 K/uL (ref 150.0–400.0)
RBC: 4.71 Mil/uL (ref 3.87–5.11)
RDW: 12.6 % (ref 11.5–15.5)
WBC: 6.6 K/uL (ref 4.0–10.5)

## 2018-03-22 MED ORDER — ATENOLOL 25 MG PO TABS: 25.0000 mg | ORAL_TABLET | Freq: Every day | ORAL | Status: DC

## 2018-03-22 MED ORDER — SUMATRIPTAN SUCCINATE 25 MG PO TABS: 25.0000 mg | ORAL_TABLET | ORAL | 5 refills | Status: DC | PRN

## 2018-03-22 NOTE — Progress Notes (Signed)
New patient.  To establish care.    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    POTS.  Has seen cards.  She has f/u pending.  Still on atenolol.  Compliant with medication.  She drinks plenty of fluid and has adequate salt intake.  Migraines.  On proph meds at baseline.  ~2x/month.  Manageable.  Rare imitrex use.  No aura.  No complication or adverse effect on current medications.  Mood.  H/o eating disorder, noted 1st year of college.  Has been in counseling, still on prozac.  Has seen nutrition.   Off zyprexa for the last year.  Mood is "good."  No SI/HI.  She asked not to have her weight revealed to her upon check-in at visits.  We talked about weight management.  I am less concerned at this point with a specific weight.  I am more concerned with the process that she uses to maintain a healthy weight, with adequate caloric intake of healthy foods and reasonable amount of exercise and sleep.  She agreed.  She has made significant progress, per her report.  H/o IBS like sx with prev GI eval noted, she is going to request records.    Advanced directive discussed with patient.  She would have her parents designated if she were incapacitated.  She is engaged to be married on August 03, 2018.  Discussed with patient about Pap smear.  Previous collected sample at outside clinic was not resulted.  Discussed with patient about considering follow-up Pap smear either here or elsewhere.  I will await input from patient.  Flu shot will be done at work.  PMH and SH reviewed  ROS: Per HPI unless specifically indicated in ROS section   Meds, vitals, and allergies reviewed.   GEN: nad, alert and oriented HEENT: mucous membranes moist NECK: supple w/o LA CV: rrr.  no murmur PULM: ctab, no inc wob ABD: soft, +bs EXT: no edema SKIN: no acute rash Affect, speech, judgment appear normal.

## 2018-03-22 NOTE — Patient Instructions (Addendum)
Please get me a copy of your records from the [facility].   I'm okay with managing your migraines at this point.  If you want to see neuro, then let me know.   Let me know if you need a referral for cardiology.   Go to the lab on the way out.  We'll contact you with your lab report. Take care.  Glad to see you.  I would get a flu shot each fall.   Update me as needed.

## 2018-03-25 ENCOUNTER — Encounter: Payer: Self-pay | Admitting: Family Medicine

## 2018-03-25 DIAGNOSIS — Z7189 Other specified counseling: Secondary | ICD-10-CM | POA: Insufficient documentation

## 2018-03-25 NOTE — Assessment & Plan Note (Signed)
H/o eating disorder, noted 1st year of college.  Has been in counseling, still on prozac.  Has seen nutrition.   Off zyprexa for the last year.  Mood is "good."  No SI/HI.  She asked not to have her weight revealed to her upon check-in at visits.  We talked about weight management.  I am less concerned at this point with a specific weight.  I am more concerned with the process that she uses to maintain a healthy weight, with adequate caloric intake of healthy foods and reasonable amount of exercise and sleep.  She agreed.  She has made significant progress, per her report.  She will continue with counseling and update me as needed.  Okay for outpatient follow-up.

## 2018-03-25 NOTE — Assessment & Plan Note (Signed)
Advanced directive discussed with patient.  She would have her parents designated if she were incapacitated.

## 2018-03-25 NOTE — Assessment & Plan Note (Addendum)
History of POTS, has seen cardiology.  She will follow-up with cardiology and update me in the meantime.  Continue with adequate fluid and salt intake.  No change in medications at this point.  Reasonable to recheck routine labs today.  See notes on labs.

## 2018-03-25 NOTE — Assessment & Plan Note (Signed)
She has had IBS-like symptoms previously and she will request her records from the GI clinic.

## 2018-03-25 NOTE — Assessment & Plan Note (Signed)
No adverse effect on current medications with about 2 episodes per month.  Manageable.  Continue as is.  She agrees.  She will update me as needed.  I am okay managing her current medications.  If she wants to get set up with neurology within we can refer her in the future.

## 2018-04-04 DIAGNOSIS — F419 Anxiety disorder, unspecified: Secondary | ICD-10-CM | POA: Diagnosis not present

## 2018-04-22 DIAGNOSIS — G909 Disorder of the autonomic nervous system, unspecified: Secondary | ICD-10-CM | POA: Diagnosis not present

## 2018-06-13 ENCOUNTER — Other Ambulatory Visit (INDEPENDENT_AMBULATORY_CARE_PROVIDER_SITE_OTHER): Payer: Self-pay | Admitting: Pediatrics

## 2018-06-13 DIAGNOSIS — G43009 Migraine without aura, not intractable, without status migrainosus: Secondary | ICD-10-CM

## 2018-06-13 NOTE — Telephone Encounter (Signed)
°  Who's calling (name and relationship to patient) : Self  Best contact number: [phone]  Provider they see: Sharene SkeansHickling and Inetta Fermoina  Reason for call: Refiill     PRESCRIPTION REFILL ONLY  Name of prescription: Needs refill for Qudexy-scheduled a follow up for 06/28/18 with Dr. Sharene SkeansHickling.  Pharmacy: [facility]

## 2018-06-14 MED ORDER — QUDEXY XR 150 MG PO CS24: 150.0000 mg | EXTENDED_RELEASE_CAPSULE | Freq: Every day | ORAL | 0 refills | Status: DC

## 2018-06-14 NOTE — Telephone Encounter (Signed)
Patient was given enough to last her until her appointment

## 2018-06-21 DIAGNOSIS — M545 Low back pain: Secondary | ICD-10-CM | POA: Diagnosis not present

## 2018-06-21 DIAGNOSIS — S339XXA Sprain of unspecified parts of lumbar spine and pelvis, initial encounter: Secondary | ICD-10-CM | POA: Diagnosis not present

## 2018-06-24 ENCOUNTER — Other Ambulatory Visit: Payer: Self-pay | Admitting: *Deleted

## 2018-06-24 NOTE — Telephone Encounter (Signed)
Faxed refill request. Junel Last office visit:   03/22/2018 Last Filled:    84 tablet 3 05/04/2017  Please advise.

## 2018-06-25 MED ORDER — JUNEL 1.5/30 1.5-30 MG-MCG PO TABS: 1.0000 | ORAL_TABLET | Freq: Every day | ORAL | 3 refills | Status: DC

## 2018-06-25 NOTE — Telephone Encounter (Signed)
Sent. Thanks.   

## 2018-06-28 ENCOUNTER — Ambulatory Visit (INDEPENDENT_AMBULATORY_CARE_PROVIDER_SITE_OTHER): Payer: BLUE CROSS/BLUE SHIELD | Admitting: Pediatrics

## 2018-06-28 ENCOUNTER — Encounter (INDEPENDENT_AMBULATORY_CARE_PROVIDER_SITE_OTHER): Payer: Self-pay | Admitting: Pediatrics

## 2018-06-28 VITALS — BP 130/70 | HR 72 | Ht 65.25 in | Wt 130.6 lb

## 2018-06-28 DIAGNOSIS — I951 Orthostatic hypotension: Secondary | ICD-10-CM

## 2018-06-28 DIAGNOSIS — R000 Tachycardia, unspecified: Secondary | ICD-10-CM | POA: Diagnosis not present

## 2018-06-28 DIAGNOSIS — G90A Postural orthostatic tachycardia syndrome (POTS): Secondary | ICD-10-CM

## 2018-06-28 DIAGNOSIS — G43009 Migraine without aura, not intractable, without status migrainosus: Secondary | ICD-10-CM

## 2018-06-28 MED ORDER — QUDEXY XR 150 MG PO CS24: 150.0000 mg | EXTENDED_RELEASE_CAPSULE | Freq: Every day | ORAL | 5 refills | Status: DC

## 2018-06-28 MED ORDER — SUMATRIPTAN SUCCINATE 25 MG PO TABS: 25.0000 mg | ORAL_TABLET | ORAL | 5 refills | Status: DC | PRN

## 2018-06-28 NOTE — Progress Notes (Signed)
Patient: Jessica [name] MRN: [id number] Sex: female DOB: 1996-01-15  Provider: Ellison CarwinWilliam , MD [facility] of Care[facility]  Note type: Routine return visit  History of Present Illness: Referral Source: Theora Gianottihelle Jeffrey, PA-C [facility] Chief Complaint: Migraines  Jessica [name] is a 22 y.o. female who returns on June 28, 2018 for the first time since May 08, 2017.  The patient has a history of migraines, vasovagal syncope with orthostatic hypotension that may be a variation of POTS, history of closed head injury, and anorexia nervosa.  Qudexy has been a good migraine preventative and sumatriptan has given her relief.  She has been treated by Cardiology for her vasovagal syncope and orthostatic intolerance and this has worked very well.  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  She is very happy with her job.  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  She is engaged and will be married in January.  She experiences 1 to 2 migraines a month, but feels that she can tolerate them and work through them with the sumatriptan and ibuprofen.  She does not see any reason to increase the Qudexy.  She has some dull tension headaches 1 to 2 times a week.  She is not having significant problems with her orthostatic hypotension.  She is sleeping well and looks very well.  Review of Systems: A complete review of systems was remarkable for patient reports that she has two to three headaches a week. She also reports that she has one to two migraines a month. No symptoms reported, all other systems reviewed and negative.  Past Medical History Diagnosis Date  . Adjustment disorder with mixed anxiety and depressed mood   . Anorexia  nervosa   . Autonomic dysfunction   . Migraine headache   . POTS (postural orthostatic tachycardia syndrome)   . Splenomegaly   . Syncope 09/22/2014  . Vasovagal syncope    Hospitalizations: No., Head Injury: No., Nervous System Infections: No., Immunizations up to date: Yes.    She had onset of headaches, some of them migraines beginning in the 4th or 5th grade. These involve stabbing left-sided pain that lasts for an hour before easing. The episodes are recurrent and are associated with nausea and vomiting, left sided throbbing, no sensitivity to light, sound, or movement. The patient recently had at least one episode of 10-15 minutes of tingling in her face and fingers associated with headaches.   She also had episodes of what appeared to be positional vertigo, but became syncope. She was evaluated by Dr. Darlis LoanGreg Tatum on April 08, 2012. He noted a previous evaluation, June 22, 2011 for syncope. EKG and echocardiogram were normal. 24-hour Holter monitor was normal. She had persistent episodes of tachycardia and dizziness following the evaluation. She has had initial improvement with Florinef; however, on 0.3 mg per day, she did not have persistent improvement. She complained of three episodes of loss of consciousness per month. On March 04, 2012, she had an episode of stiffening which was unusual. A decision was made to place her on atenolol when her pulse went up from 68 to 105 from supine to standing. She had a closed head injury from a fall  on July 15, 2015 and had post concussion syndrome symptoms for a couple of months afterwards.  Behavior History none  Surgical History Procedure Laterality Date  . ANTERIOR CRUCIATE LIGAMENT REPAIR  03/2010  . EYE SURGERY  2000   clogged tear duct  . WISDOM TOOTH EXTRACTION      Family History family history includes Breast cancer in her maternal aunt; Cancer in her maternal aunt and maternal grandfather; Depression in her unknown relative;  Diabetes in her maternal grandfather and maternal grandmother; Eating disorder in her maternal aunt; Hypertension in her father; Kidney Stones in her father; Mental retardation in her other; Migraines in her maternal aunt, maternal uncle, and mother; Other in her other; Seizures in her father and mother; Stroke in her maternal grandfather. Family history is negative for migraines, seizures, intellectual disabilities, blindness, deafness, birth defects, chromosomal disorder, or autism.  Social History Social History   Socioeconomic History  . Marital status:  Engaged  . Years of education:  76  . Highest education level:  College graduate  Occupational History  . Occupation: student    Comment: [employer] neurointensive care  Social Needs  . Financial resource strain: Not on file  . Food insecurity:    Worry: Not on file    Inability: Not on file  . Transportation needs:    Medical: Not on file    Non-medical: Not on file  Tobacco Use  . Smoking status: Never Smoker  . Smokeless tobacco: Never Used  Substance and Sexual Activity  . Alcohol use: Yes    Alcohol/week: 0.0 standard drinks    Comment: Drinks on occasion   . Drug use: No  . 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   She lives with her parents and siblings.    Allergies Allergen Reactions  . Amoxicillin Rash  . Gluten Meal Other (See Comments)    Avoids on principle due to POTS   Physical Exam BP 130/70   Pulse 72   Ht 5' 5.25" (1.657 m)   Wt 130 lb 9.6 oz (59.2 kg)   BMI 21.57 kg/m   General: alert, well developed, well nourished, in no acute distress, brown hair, brown eyes, right handed Head: normocephalic, no dysmorphic features Ears, Nose and Throat: Otoscopic: tympanic membranes normal; pharynx: oropharynx is pink without exudates or tonsillar hypertrophy Neck: supple, full range of motion,  no cranial or cervical bruits Respiratory: auscultation clear Cardiovascular: no murmurs, pulses are normal Musculoskeletal: no skeletal deformities or apparent scoliosis Skin: no rashes or neurocutaneous lesions  Neurologic Exam  Mental Status: alert; oriented to person, place and year; knowledge is normal for age; language is normal Cranial Nerves: visual fields are full to double simultaneous stimuli; extraocular movements are full and conjugate; pupils are round reactive to light; funduscopic examination shows sharp disc margins with normal vessels; symmetric facial strength; midline tongue and uvula; air conduction is greater than bone conduction bilaterally Motor: Normal strength, tone and mass; good fine motor movements; no pronator drift Sensory: intact responses to cold, vibration, proprioception and stereognosis Coordination: good finger-to-nose, rapid repetitive alternating movements and finger apposition Gait and Station: normal gait and station: patient is able to walk on heels, toes and tandem without difficulty; balance is adequate; Romberg exam is negative; Gower response is negative Reflexes: symmetric and diminished bilaterally; no clonus; bilateral flexor plantar responses  Assessment 1. Migraine without aura without status migrainosus, not intractable, G43.009. 2. Postural orthostatic tachycardia syndrome, R00.0, I95.1.  Discussion I am pleased with patient's condition and believe that there is no reason to make changes in medicines that I prescribe, which are Qudexy and sumatriptan.    Plan I talked to her about looking for an adult neurologist and told her that we could refer her to the adult neurologist in [place] either at [facility] or [facility].  I also mentioned that there is a neurologist in [place] at the [facility], which would be much closer to home.  Finally, I suggested that she consider a neurologist at [facility] because she would be in network and it would be convenient for her to get appointments.  I told her when she figured this out to let me know and we would make available records for them.  Because she is doing so well, I do not think that we have difficult to make a transition of care to an adult neurologist.  Greater than 50% of a 15 minute visit was spent in counseling and coordination of care.  I refilled her prescriptions for Qudexy and sumatriptan.  If she is unable to find a neurologist, she will return again in July 2020 for evaluation either by myself or Elveria Rising.   Medication List    Accurate as of 06/28/18 11:59 PM.      atenolol 25 MG tablet Commonly known as:  TENORMIN Take 1 tablet (25 mg total) by mouth daily.   fludrocortisone 0.1 MG tablet Commonly known as:  FLORINEF Take 1 tablet by mouth daily   FLUoxetine 40 MG capsule Commonly known as:  PROZAC TAKE 1 CAPSULE BY MOUTH DAILY.   ibuprofen 800 MG tablet Commonly known as:  ADVIL,MOTRIN Take 1 tablet (800 mg total) by mouth every 8 (eight) hours as needed for mild pain or moderate pain.   JUNEL 1.5/30 1.5-30 MG-MCG tablet Generic drug:  Norethindrone Acetate-Ethinyl Estradiol Take 1 tablet by mouth daily.   pantoprazole 40 MG tablet Commonly known as:  PROTONIX   QUDEXY XR 150 MG Cs24 sprinkle capsule Generic drug:  topiramate ER Take 1 capsule (150 mg total) by mouth at bedtime.   SUMAtriptan 25 MG tablet Commonly known as:  IMITREX Take 1 tablet (25 mg total) by mouth every 2 (two) hours as needed for migraine. May repeat in 2 hours if headache persists or recurs.    The medication list was reviewed and reconciled. All changes or newly prescribed medications were explained.  A complete medication list was provided to the patient/caregiver.  Deetta Perla MD

## 2018-06-28 NOTE — Patient Instructions (Signed)
I'm very pleased for you and prod of you.  Look [facility] at [facility] and see if you can find a practitioner in Neuro to whom we can transfer you care.  Happy holidays, congratulations on your impending marriage.

## 2018-07-03 DIAGNOSIS — F419 Anxiety disorder, unspecified: Secondary | ICD-10-CM | POA: Diagnosis not present

## 2018-07-19 ENCOUNTER — Other Ambulatory Visit: Payer: Self-pay | Admitting: Pediatrics

## 2018-07-19 DIAGNOSIS — F4323 Adjustment disorder with mixed anxiety and depressed mood: Secondary | ICD-10-CM

## 2018-07-19 DIAGNOSIS — F5000 Anorexia nervosa, unspecified: Secondary | ICD-10-CM

## 2018-08-22 ENCOUNTER — Telehealth: Payer: Self-pay

## 2018-08-22 NOTE — Telephone Encounter (Signed)
[facility] day supply of [facility] [place]

## 2018-08-26 ENCOUNTER — Other Ambulatory Visit: Payer: Self-pay | Admitting: Family

## 2018-08-26 DIAGNOSIS — F5000 Anorexia nervosa, unspecified: Secondary | ICD-10-CM

## 2018-08-26 DIAGNOSIS — F4323 Adjustment disorder with mixed anxiety and depressed mood: Secondary | ICD-10-CM

## 2018-08-26 MED ORDER — FLUOXETINE HCL 40 MG PO CAPS: 40.0000 mg | ORAL_CAPSULE | Freq: Every day | ORAL | 0 refills | Status: DC

## 2018-08-26 NOTE — Telephone Encounter (Signed)
Also received another 90 day request for fluoxetine. Sending to [facility].

## 2018-08-27 ENCOUNTER — Other Ambulatory Visit: Payer: Self-pay | Admitting: Pediatrics

## 2018-08-27 IMAGING — US US ABDOMEN COMPLETE
1 series · 14 of 25 positions shown · non-contrast
Comparison: None.

CLINICAL DATA: Left upper quadrant and periumbilical pain for 2
months

EXAM:
ABDOMEN ULTRASOUND COMPLETE

[Series 1: us abdomen complete · 0.12mm/px · 14 of 90 slices shown]
[im 1/90]
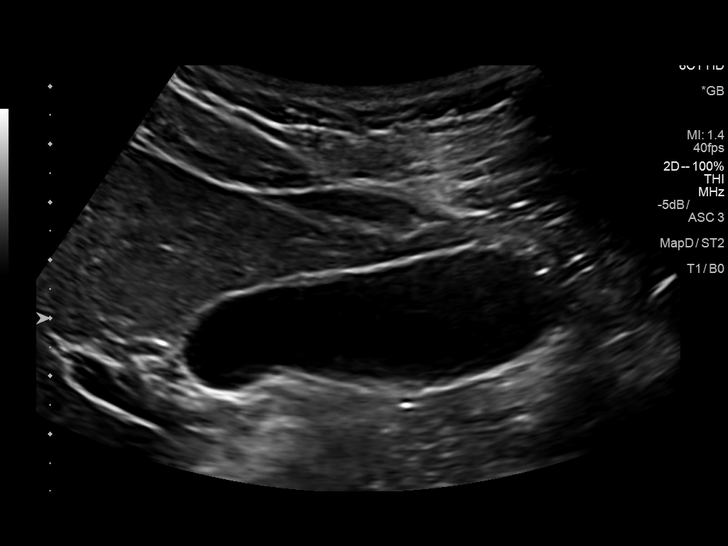
[im 8/90]
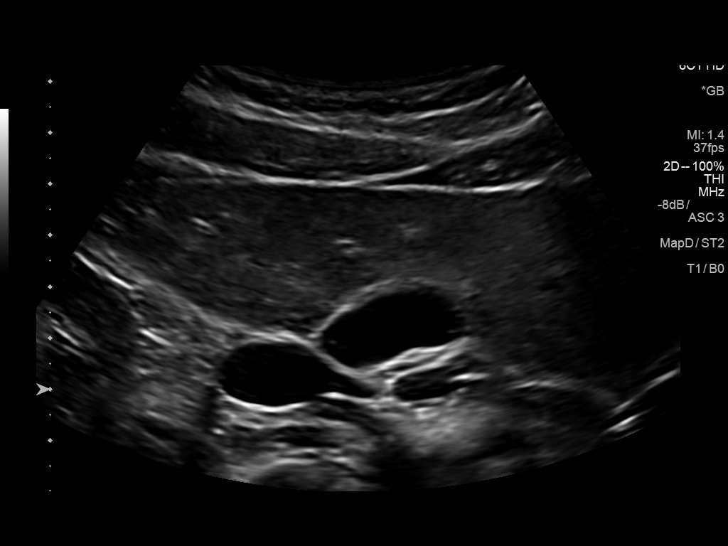
[im 15/90]
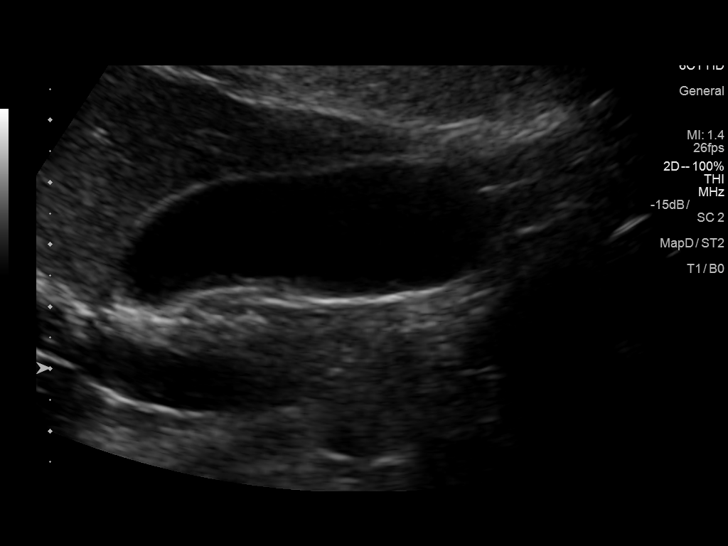
[im 23/90]
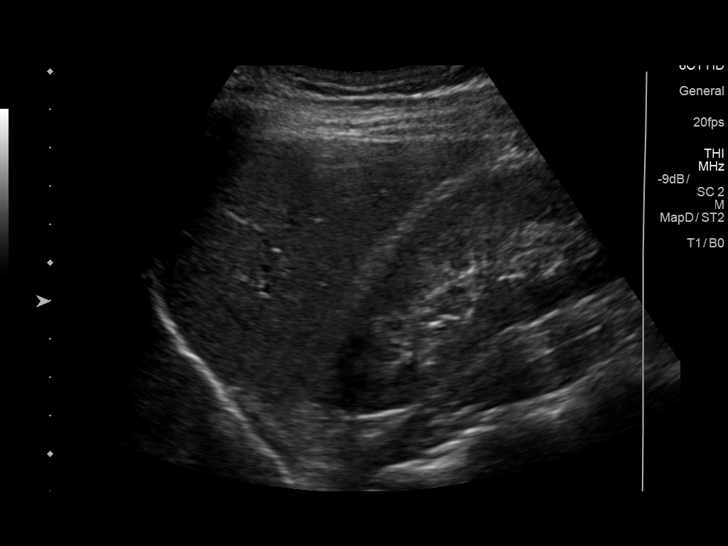
[im 30/90]
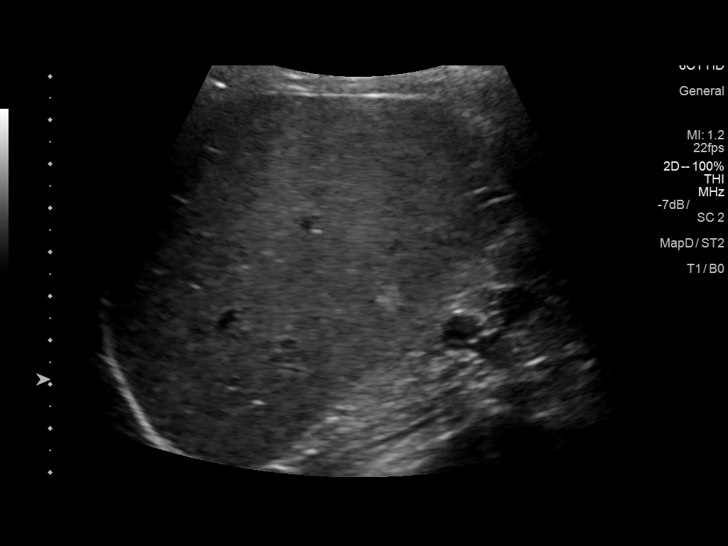
[im 34/90]
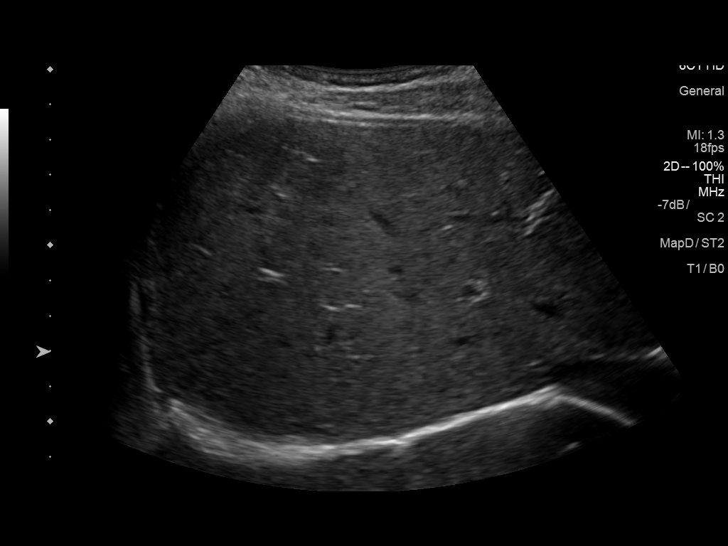
[im 41/90]
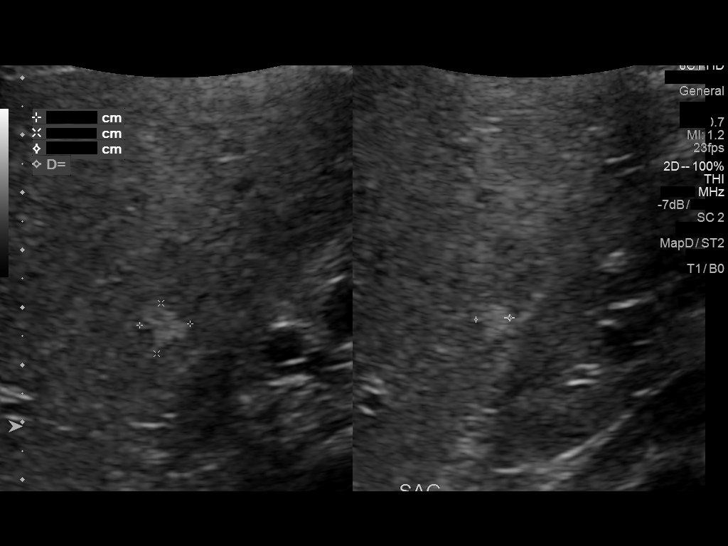
[im 49/90]
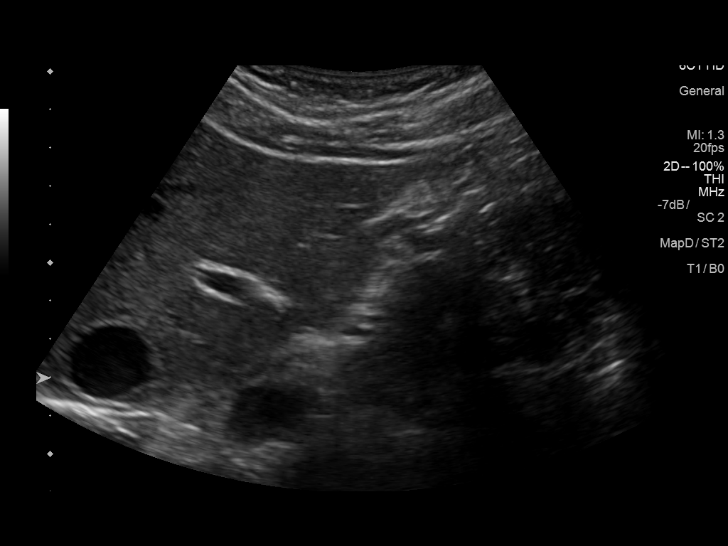
[im 56/90]
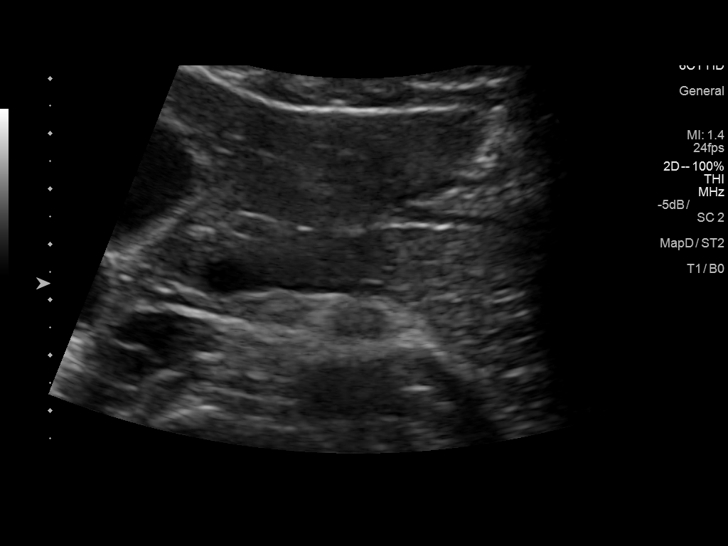
[im 60/90]
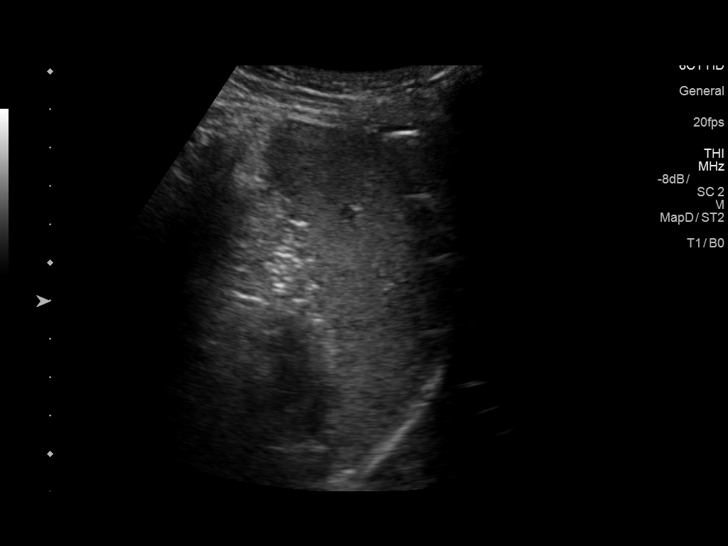
[im 67/90]
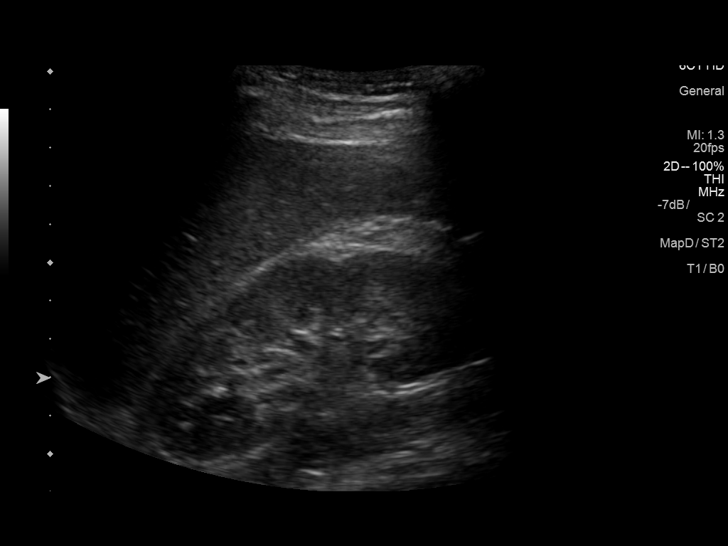
[im 75/90]
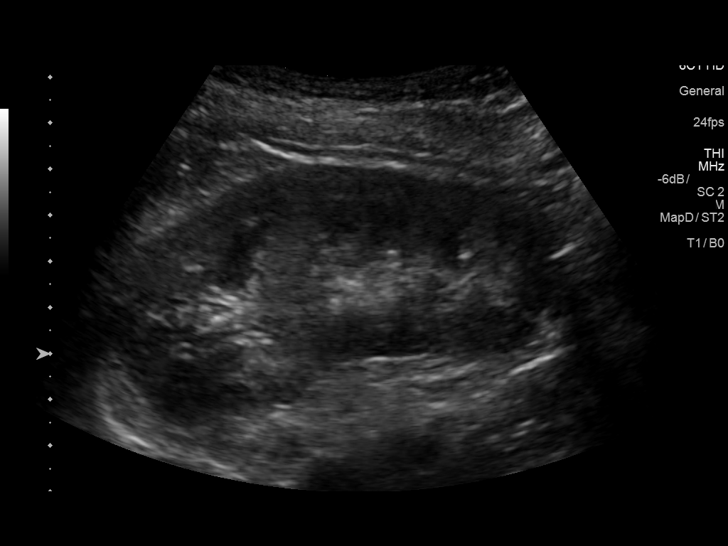
[im 82/90]
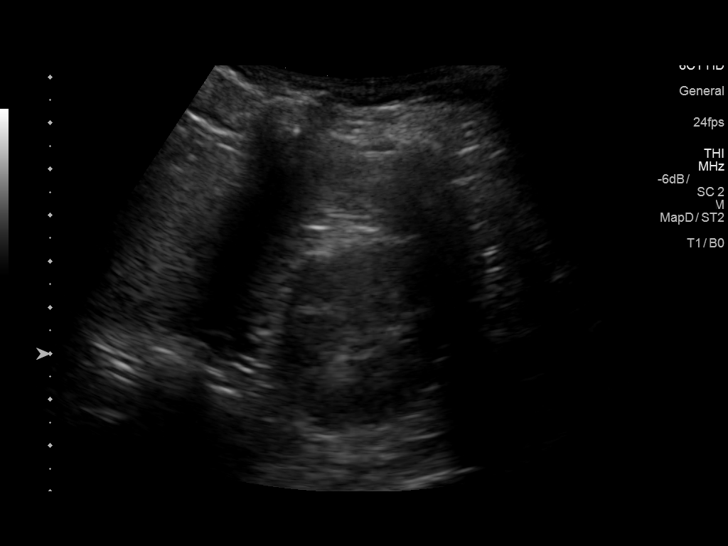
[im 90/90]
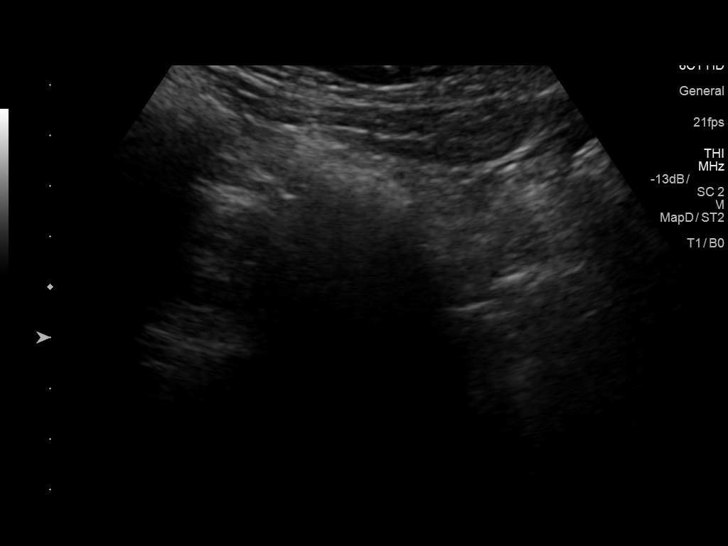

[14 of 25 positions shown; findings below may reference images not displayed]

FINDINGS: Gallbladder: No gallstones or wall thickening visualized. No
sonographic Murphy sign noted by sonographer.

Common bile duct: Diameter: Normal caliber, 2 mm

Liver: 9 mm echogenic area with in the right lobe of the liver,
likely small hemangioma. Normal echotexture otherwise. No biliary
duct dilatation.

IVC: No abnormality visualized.

Pancreas: Visualized portion unremarkable.

Spleen: Size and appearance within normal limits.

Right Kidney: Length: 9.8 cm. Echogenicity within normal limits. No
mass or hydronephrosis visualized.

Left Kidney: Length: 10 cm. Echogenicity within normal limits. No
mass or hydronephrosis visualized.

Abdominal aorta: No aneurysm visualized.

Other findings: None.
IMPRESSION: No acute findings. Probable small hemangioma in the right lobe of
the liver.

## 2018-08-27 NOTE — Telephone Encounter (Signed)
Done 2/3 by Neysa Bonito.

## 2018-10-10 ENCOUNTER — Telehealth (INDEPENDENT_AMBULATORY_CARE_PROVIDER_SITE_OTHER): Payer: Self-pay | Admitting: Pediatrics

## 2018-10-10 DIAGNOSIS — G43009 Migraine without aura, not intractable, without status migrainosus: Secondary | ICD-10-CM

## 2018-10-10 MED ORDER — QUDEXY XR 150 MG PO CS24: 150.0000 mg | EXTENDED_RELEASE_CAPSULE | Freq: Every day | ORAL | 5 refills | Status: DC

## 2018-10-10 NOTE — Telephone Encounter (Signed)
Who's calling (name and relationship to patient) : Zai Lyndon (self)  Best contact number: [phone]  Provider they see: Dr. Sharene Skeans  Reason for call:  Bryelle called in and left a voicemail stating that she went to get her Qudexy 150mg  filled and the pharmacy stated that the Rx was expired and would need a new hard copy for that. Brentley also stated that the pharmacy had changed, [facility] [place] [facility] [address] PT was called back by front office staff, message was left on the voicemail stating that we had received her message and that it would be relayed to the appropriate party. Please advise.   Call ID:      PRESCRIPTION REFILL ONLY  Name of prescription:  Qudexy 150mg    [facility] [address], [place]

## 2018-10-10 NOTE — Telephone Encounter (Signed)
Rx has been sent to the pharmacy requested 

## 2018-10-11 ENCOUNTER — Telehealth (INDEPENDENT_AMBULATORY_CARE_PROVIDER_SITE_OTHER): Payer: Self-pay | Admitting: Pediatrics

## 2018-10-11 DIAGNOSIS — G43009 Migraine without aura, not intractable, without status migrainosus: Secondary | ICD-10-CM

## 2018-10-11 MED ORDER — TOPIRAMATE 50 MG PO TABS: ORAL_TABLET | ORAL | 0 refills | Status: DC

## 2018-10-11 MED ORDER — QUDEXY XR 150 MG PO CS24: 150.0000 mg | EXTENDED_RELEASE_CAPSULE | Freq: Every day | ORAL | 5 refills | Status: DC

## 2018-10-11 NOTE — Telephone Encounter (Signed)
Patient called on call provider, patient is out of medication and requiring a prior authorization for Qudexy.  In review of chart, prescription was sent yesterday and again today.  I called [facility] to confirm [facility] [place] PA paperwork yesterday.   I instructed Barabara we unfortunately won't be able to manage this over the weekend, but I could send her a prescription for short acting topamax to get her through the weekend.  Advised to divide it into BID dosing for the closest approximation to ER dosing.   Tiffanie, please work on PA on Monday.   Lorenz Coaster MD MPH

## 2018-10-11 NOTE — Telephone Encounter (Signed)
Rx resent, per mom pharmacy didn't receive it

## 2018-10-14 NOTE — Telephone Encounter (Signed)
PA has been done and approved. Pharmacy was called and patient can pick medication up. Patient is aware.

## 2018-10-15 ENCOUNTER — Telehealth (INDEPENDENT_AMBULATORY_CARE_PROVIDER_SITE_OTHER): Payer: Self-pay | Admitting: Pediatrics

## 2018-10-15 NOTE — Telephone Encounter (Signed)
°  Who's calling (name and relationship to patient) : [place] with Cover My Meds  Best contact number: [phone]  Provider they see: Sharene Skeans  Reason for call: Prior Auth for case number [id number] did not go through bc patient's phone number is not listed. Stated to go online and enter the patient's phone number. Would not take this verbally.      PRESCRIPTION REFILL ONLY  Name of prescription:  Pharmacy:

## 2018-10-15 NOTE — Telephone Encounter (Signed)
PA was done yesterday and patient was able to get her medication

## 2018-10-30 DIAGNOSIS — F419 Anxiety disorder, unspecified: Secondary | ICD-10-CM | POA: Diagnosis not present

## 2018-11-06 DIAGNOSIS — F419 Anxiety disorder, unspecified: Secondary | ICD-10-CM | POA: Diagnosis not present

## 2018-11-19 DIAGNOSIS — F419 Anxiety disorder, unspecified: Secondary | ICD-10-CM | POA: Diagnosis not present

## 2018-11-29 ENCOUNTER — Other Ambulatory Visit: Payer: Self-pay

## 2018-11-29 NOTE — Telephone Encounter (Signed)
Refill request for Junel on patient. Patient last saw Dr .Para March on 03/22/2018 for new patient and no future appointments have been made. Ok to refill?

## 2018-12-01 MED ORDER — JUNEL 1.5/30 1.5-30 MG-MCG PO TABS: 1.0000 | ORAL_TABLET | Freq: Every day | ORAL | 3 refills | Status: DC

## 2018-12-01 NOTE — Telephone Encounter (Signed)
Sent. Thanks.  Yearly check up when possible after 02/2019

## 2018-12-02 DIAGNOSIS — F419 Anxiety disorder, unspecified: Secondary | ICD-10-CM | POA: Diagnosis not present

## 2018-12-05 ENCOUNTER — Encounter: Payer: Self-pay | Admitting: Family Medicine

## 2018-12-05 DIAGNOSIS — F5000 Anorexia nervosa, unspecified: Secondary | ICD-10-CM

## 2018-12-05 DIAGNOSIS — I951 Orthostatic hypotension: Secondary | ICD-10-CM

## 2018-12-05 DIAGNOSIS — F4323 Adjustment disorder with mixed anxiety and depressed mood: Secondary | ICD-10-CM

## 2018-12-06 MED ORDER — FLUDROCORTISONE ACETATE 0.1 MG PO TABS: ORAL_TABLET | ORAL | 3 refills | Status: DC

## 2018-12-06 MED ORDER — FLUOXETINE HCL 40 MG PO CAPS: 40.0000 mg | ORAL_CAPSULE | Freq: Every day | ORAL | 3 refills | Status: DC

## 2018-12-06 NOTE — Telephone Encounter (Signed)
rx sent.  Notify pt.  Update me as needed.  Thanks.

## 2019-02-05 ENCOUNTER — Telehealth (INDEPENDENT_AMBULATORY_CARE_PROVIDER_SITE_OTHER): Payer: Self-pay | Admitting: Pediatrics

## 2019-02-05 ENCOUNTER — Other Ambulatory Visit: Payer: Self-pay

## 2019-02-05 ENCOUNTER — Encounter: Payer: Self-pay | Admitting: Emergency Medicine

## 2019-02-05 ENCOUNTER — Emergency Department
Admission: EM | Admit: 2019-02-05 | Discharge: 2019-02-05 | Disposition: A | Discharge status: Home / Self Care | Payer: 59 | Attending: Emergency Medicine | Admitting: Emergency Medicine

## 2019-02-05 DIAGNOSIS — G43909 Migraine, unspecified, not intractable, without status migrainosus: Secondary | ICD-10-CM | POA: Diagnosis not present

## 2019-02-05 DIAGNOSIS — G43901 Migraine, unspecified, not intractable, with status migrainosus: Secondary | ICD-10-CM | POA: Diagnosis not present

## 2019-02-05 DIAGNOSIS — Z79899 Other long term (current) drug therapy: Secondary | ICD-10-CM | POA: Diagnosis not present

## 2019-02-05 LAB — CBC WITH DIFFERENTIAL/PLATELET
Abs Immature Granulocytes: 0.02 10*3/uL (ref 0.00–0.07)
Basophils Absolute: 0.1 10*3/uL (ref 0.0–0.1)
Basophils Relative: 2 %
Eosinophils Absolute: 0.1 10*3/uL (ref 0.0–0.5)
Eosinophils Relative: 2 %
HCT: 42.7 % (ref 36.0–46.0)
Hemoglobin: 14.1 g/dL (ref 12.0–15.0)
Immature Granulocytes: 0 %
Lymphocytes Relative: 39 %
Lymphs Abs: 2.7 10*3/uL (ref 0.7–4.0)
MCH: 30.3 pg (ref 26.0–34.0)
MCHC: 33 g/dL (ref 30.0–36.0)
MCV: 91.6 fL (ref 80.0–100.0)
Monocytes Absolute: 0.4 10*3/uL (ref 0.1–1.0)
Monocytes Relative: 6 %
Neutro Abs: 3.5 10*3/uL (ref 1.7–7.7)
Neutrophils Relative %: 51 %
Platelets: 292 10*3/uL (ref 150–400)
RBC: 4.66 MIL/uL (ref 3.87–5.11)
RDW: 12 % (ref 11.5–15.5)
WBC: 6.9 10*3/uL (ref 4.0–10.5)
nRBC: 0 % (ref 0.0–0.2)

## 2019-02-05 LAB — BASIC METABOLIC PANEL
Anion gap: 7 (ref 5–15)
BUN: 13 mg/dL (ref 6–20)
CO2: 20 mmol/L — ABNORMAL LOW (ref 22–32)
Calcium: 9.1 mg/dL (ref 8.9–10.3)
Chloride: 113 mmol/L — ABNORMAL HIGH (ref 98–111)
Creatinine, Ser: 0.71 mg/dL (ref 0.44–1.00)
GFR calc Af Amer: >60 mL/min (ref >60)
GFR calc non Af Amer: >60 mL/min (ref >60)
Glucose, Bld: 110 mg/dL — ABNORMAL HIGH (ref 70–99)
Potassium: 3.4 mmol/L — ABNORMAL LOW (ref 3.5–5.1)
Sodium: 140 mmol/L (ref 135–145)

## 2019-02-05 MED ORDER — SODIUM CHLORIDE 0.9 % IV BOLUS
1000.0000 mL | Freq: Once | INTRAVENOUS | Status: AC
  Administered 2019-02-05: 1000 mL via INTRAVENOUS

## 2019-02-05 MED ORDER — DIPHENHYDRAMINE HCL 50 MG/ML IJ SOLN
12.5000 mg | Freq: Once | INTRAMUSCULAR | Status: AC
  Administered 2019-02-05: 12.5 mg via INTRAVENOUS
  Filled 2019-02-05: qty 1

## 2019-02-05 MED ORDER — DEXAMETHASONE SODIUM PHOSPHATE 10 MG/ML IJ SOLN
10.0000 mg | Freq: Once | INTRAMUSCULAR | Status: AC
  Administered 2019-02-05: 10 mg via INTRAVENOUS
  Filled 2019-02-05: qty 1

## 2019-02-05 MED ORDER — PROCHLORPERAZINE EDISYLATE 10 MG/2ML IJ SOLN
10.0000 mg | Freq: Once | INTRAMUSCULAR | Status: AC
  Administered 2019-02-05: 10 mg via INTRAVENOUS
  Filled 2019-02-05: qty 2

## 2019-02-05 NOTE — Telephone Encounter (Signed)
Patient is gotten adequate sleep and is hydrating herself and not skipping meals.  I recommended ED visit for a migraine cocktail.  If that fails we may need to admit her for DHE protocol.  We need to think about some of the CGRP inhibitors for her because of her age and the fact that she is not doing well on standard migraine preventative therapy.

## 2019-02-05 NOTE — Telephone Encounter (Signed)
Who's calling (name and relationship to patient) : [name] (mom)  Best contact number: [phone]  Provider they see: Dr. Gaynell Face  Reason for call:  Jessica [name] called in stating she has had a headache for 7-10 days. Has taken the Imitrex 3 different days with mild relief and then once medication wore off headache came back as strong as before medication. Wants to know if there is anything else that can be done at this time. Please advise.   Call ID:      PRESCRIPTION REFILL ONLY  Name of prescription:  Pharmacy:

## 2019-02-05 NOTE — ED Provider Notes (Signed)
[facility] Emergency Department Provider Note   ____________________________________________   First MD Initiated Contact with Patient 02/05/19 1824     (approximate)  I have reviewed the triage vital signs and the nursing notes.   HISTORY  Chief Complaint Migraine    HPI Jessica [name] is a 23 y.o. female who reports her usual bad migraine without aura.  It is lasting a lot longer than usual and her Imitrex is not working.  She went to see neurology and they sent her here for IV treatment.  I discussed this with her I will give her some Compazine and dexamethasone fluids and Benadryl.  Her husband will drive her home.         Past Medical History:  Diagnosis Date  . Adjustment disorder with mixed anxiety and depressed mood   . Anorexia nervosa   . Autonomic dysfunction   . Migraine headache   . POTS (postural orthostatic tachycardia syndrome)   . Splenomegaly   . Syncope 09/22/2014  . Vasovagal syncope     Patient Active Problem List   Diagnosis Date Noted  . Advance care planning 03/25/2018  . POTS (postural orthostatic tachycardia syndrome) 05/07/2017  . Adjustment disorder with mixed anxiety and depressed mood 07/13/2015  . GI symptom 05/24/2015  . Anorexia nervosa 04/29/2015  . Migraine without aura 10/30/2012    Past Surgical History:  Procedure Laterality Date  . ANTERIOR CRUCIATE LIGAMENT REPAIR  03/2010  . EYE SURGERY  2000   clogged tear duct  . WISDOM TOOTH EXTRACTION      Prior to Admission medications   Medication Sig Start Date End Date Taking? Authorizing Provider  atenolol (TENORMIN) 25 MG tablet Take 1 tablet (25 mg total) by mouth daily. 03/22/18   Joaquim Namuncan, Graham S, MD  fludrocortisone (FLORINEF) 0.1 MG tablet Take 1 tablet by mouth daily 12/06/18   Joaquim Namuncan, Graham S, MD  FLUoxetine (PROZAC) 40 MG capsule Take 1 capsule (40 mg total) by mouth daily. 12/06/18   Joaquim Namuncan, Graham S, MD  ibuprofen (ADVIL,MOTRIN) 800 MG  tablet Take 1 tablet (800 mg total) by mouth every 8 (eight) hours as needed for mild pain or moderate pain. 12/10/15   Trixie DredgeWest, Emily, PA-C  JUNEL 1.5/30 1.5-30 MG-MCG tablet Take 1 tablet by mouth daily. 12/01/18   Joaquim Namuncan, Graham S, MD  pantoprazole (PROTONIX) 40 MG tablet  08/15/16   [provider]  QUDEXY XR 150 MG CS24 sprinkle capsule Take 1 capsule (150 mg total) by mouth at bedtime. 10/11/18   Deetta PerlaHickling, William H, MD  SUMAtriptan (IMITREX) 25 MG tablet Take 1 tablet (25 mg total) by mouth every 2 (two) hours as needed for migraine. May repeat in 2 hours if headache persists or recurs. 06/28/18   Deetta PerlaHickling, William H, MD  topiramate (TOPAMAX) 50 MG tablet 50mg  in am and 100mg  in pm 10/11/18   Lorenz CoasterWolfe, Stephanie, MD    Allergies Amoxicillin and Gluten meal  Family History  Problem Relation Age of Onset  . Migraines Mother        Started 4th or 5th grade  . Seizures Mother        Febrile Seizures as a child  . Seizures Father        Febrile Seizures as a child  . Hypertension Father   . Kidney Stones Father   . Migraines Maternal Aunt   . Migraines Maternal Uncle   . Diabetes Maternal Grandmother   . Stroke Maternal Grandfather  mini-stroke  . Cancer Maternal Grandfather        Bladder cancer, Died at 31  . Diabetes Maternal Grandfather   . Mental retardation Other        Maternal Second Cousin  . Other Other        Maternal Great Uncle had some sort of Retinal Vessel Occlusion  . Breast cancer Maternal Aunt   . Cancer Maternal Aunt        Peritoneal CA  . Eating disorder Maternal Aunt   . Depression Unknown        Father's side of the family  . Colon cancer Neg Hx     Social History Social History   Tobacco Use  . Smoking status: Never Smoker  . Smokeless tobacco: Never Used  Substance Use Topics  . Alcohol use: Yes    Alcohol/week: 0.0 standard drinks    Comment: Drinks on occasion   . Drug use: No    Review of Systems  Constitutional: No  fever/chills Eyes: No visual changes. ENT: No sore throat. Cardiovascular: Denies chest pain. Respiratory: Denies shortness of breath. Gastrointestinal: No abdominal pain.  No nausea, no vomiting.  No diarrhea.  No constipation. Genitourinary: Negative for dysuria. Musculoskeletal: Negative for back pain. Skin: Negative for rash. Neurological: Negative for focal weakness or numbness.   ____________________________________________   PHYSICAL EXAM:  VITAL SIGNS: ED Triage Vitals [02/05/19 1539]  Enc Vitals Group     BP      Pulse      Resp      Temp      Temp src      SpO2      Weight      Height      Head Circumference      Peak Flow      Pain Score 8     Pain Loc      Pain Edu?      Excl. in [place]?     Constitutional: Alert and oriented. Well appearing and in no acute distress. Eyes: Conjunctivae are normal. PERRL. EOMI. Head: Atraumatic. Nose: No congestion/rhinnorhea. Mouth/Throat: Mucous membranes are moist.  Oropharynx non-erythematous. Neck: No stridor. Cardiovascular: Kermit Balo peripheral circulation. Respiratory: Normal respiratory effort.  No retractions Gastrointestinal:. No distention.   Musculoskeletal: No lower extremity tenderness nor edema.  Neurologic:  Normal speech and language. No gross focal neurologic deficits are appreciated.  Cranial nerves II through XII are intact although visual fields were not checked cerebellar finger-nose is normal motor strength is 5/5 throughout patient does not report any numbness Skin:  Skin is warm, dry and intact. No rash noted. Psychiatric: Mood and affect are normal. Speech and behavior are normal.  ____________________________________________   LABS (all labs ordered are listed, but only abnormal results are displayed)  Labs Reviewed  BASIC METABOLIC PANEL - Abnormal; Notable for the following components:      Result Value   Potassium 3.4 (*)    Chloride 113 (*)    CO2 20 (*)    Glucose, Bld 110 (*)    All  other components within normal limits  CBC WITH DIFFERENTIAL/PLATELET   ____________________________________________  EKG   ____________________________________________  RADIOLOGY  ED MD interpretation:   Official radiology report(s): No results found.  ____________________________________________   PROCEDURES  Procedure(s) performed (including Critical Care):  Procedures   ____________________________________________   INITIAL IMPRESSION / ASSESSMENT AND PLAN / ED COURSE  .ARMCEDDATETIMESTAMP Patient now feels well and wants to go home.  I  will let her do so.             ____________________________________________   FINAL CLINICAL IMPRESSION(S) / ED DIAGNOSES  Final diagnoses:  Migraine with status migrainosus, not intractable, unspecified migraine type     ED Discharge Orders    None       Note:  This document was prepared using Dragon voice recognition software and may include unintentional dictation errors.    Arnaldo NatalMalinda, Kaysia Willard F, MD 02/05/19 2042

## 2019-02-05 NOTE — ED Triage Notes (Signed)
Pt c/o headache x7-10 days. Hx of migraines. Has taken imitrex at home without relief. Neuro referred pt to ED for "migraine cocktail." Pt A&Ox4. Ambulatory without difficulty.

## 2019-02-05 NOTE — Discharge Instructions (Addendum)
Please use your regular medicines.  Make sure you get enough sleep tonight.  Follow-up with your regular doctor return here as needed.

## 2019-02-07 ENCOUNTER — Encounter: Payer: Self-pay | Admitting: Family Medicine

## 2019-02-07 ENCOUNTER — Other Ambulatory Visit: Payer: Self-pay

## 2019-02-07 ENCOUNTER — Telehealth: Payer: Self-pay

## 2019-02-07 ENCOUNTER — Ambulatory Visit (INDEPENDENT_AMBULATORY_CARE_PROVIDER_SITE_OTHER): Payer: Managed Care, Other (non HMO) | Admitting: Family Medicine

## 2019-02-07 DIAGNOSIS — I808 Phlebitis and thrombophlebitis of other sites: Secondary | ICD-10-CM | POA: Diagnosis not present

## 2019-02-07 MED ORDER — DICLOFENAC SODIUM 1 % TD GEL: 2.0000 g | Freq: Three times a day (TID) | TRANSDERMAL | 0 refills | Status: DC

## 2019-02-07 MED ORDER — DOXYCYCLINE HYCLATE 100 MG PO TABS: 100.0000 mg | ORAL_TABLET | Freq: Two times a day (BID) | ORAL | 0 refills | Status: DC

## 2019-02-07 NOTE — Telephone Encounter (Signed)
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 Dr Danise Mina said that would be OK and to fix letter for pt Done and pt will pick up at front desk today before 5 PM.

## 2019-02-07 NOTE — Assessment & Plan Note (Addendum)
Clinical diagnosis after L hand IV infiltration 2 d ago for migraine cocktail at ER. Treat with elevation, heating pad, oral and topical NSAIDs. WASP for doxy antibiotic provided with indications when to fill.  She is on oral contraceptives however I don't appreciate any palpable cords to suggest thrombosis.  Red flags to seek further care reviewed.

## 2019-02-07 NOTE — Patient Instructions (Signed)
I think you have inflammation of the hand vein - will treat for superficial thrombophlbitis of left arm - with rest, elevation, heating pad, voltaren gel and ibuprofen 400-600mg  with meals.  If not continuing to improve after 24 hours, fill antibiotic sent in as well.

## 2019-02-07 NOTE — Progress Notes (Signed)
This visit was conducted in person.  BP 112/64 (BP Location: Left Arm, Patient Position: Sitting, Cuff Size: Normal)   Pulse 77   Temp 98.2 F (36.8 C) (Temporal)   Ht 5' 5.75" (1.67 m)   Wt 136 lb 8 oz (61.9 kg)   LMP 01/28/2019   SpO2 98%   BMI 22.20 kg/m    CC: ER f/u visit Subjective:    Patient ID: Jessica [name], female    DOB: Mar 01, 1996, 23 y.o.   MRN: [id number]  HPI: Jessica [name] is a 23 y.o. 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   ER records reviewed. Seen at ER 02/05/2019 for bad migraine without aura. Imitrex was not helping. Referred by neurology [facility]) for compazine, dexamethasone, benadryl and IVF. Migraines and POTS has seen Dr Gaynell Face. She is on topamax 150mg  nightly preventatively.   Yesterday morning noted soreness at site of IV on left hand/forearm. Painful, swollen, warm, red streaks. Treated with elevation, heating pad. Today redness may be a bit better ,but soreness persists.  No fevers/chills, nausea or other systemic symptoms. No dyspnea.   No personal or family history of blood clots.  She is on junel 1.5/30 daily OCP.      Relevant past medical, surgical, family and social history reviewed and updated as indicated. Interim medical history since our last visit reviewed. Allergies and medications reviewed and updated. Outpatient Medications Prior to Visit  Medication Sig Dispense Refill  . atenolol (TENORMIN) 25 MG tablet Take 1 tablet (25 mg total) by mouth daily.    . fludrocortisone (FLORINEF) 0.1 MG tablet Take 1 tablet by mouth daily 90 tablet 3  . FLUoxetine (PROZAC) 40 MG capsule Take 1 capsule (40 mg total) by mouth daily. 90 capsule 3  . ibuprofen (ADVIL,MOTRIN) 800 MG tablet Take 1 tablet (800 mg total) by mouth every 8 (eight) hours as needed for mild pain or moderate pain. 15 tablet 0  . JUNEL 1.5/30 1.5-30 MG-MCG tablet Take 1 tablet by mouth daily.  84 tablet 3  . pantoprazole (PROTONIX) 40 MG tablet     . QUDEXY XR 150 MG CS24 sprinkle capsule Take 1 capsule (150 mg total) by mouth at bedtime. 30 each 5  . SUMAtriptan (IMITREX) 25 MG tablet Take 1 tablet (25 mg total) by mouth every 2 (two) hours as needed for migraine. May repeat in 2 hours if headache persists or recurs. 10 tablet 5  . topiramate (TOPAMAX) 50 MG tablet 50mg  in am and 100mg  in pm 21 tablet 0   No facility-administered medications prior to visit.      Per HPI unless specifically indicated in ROS section below Review of Systems Objective:    BP 112/64 (BP Location: Left Arm, Patient Position: Sitting, Cuff Size: Normal)   Pulse 77   Temp 98.2 F (36.8 C) (Temporal)   Ht 5' 5.75" (1.67 m)   Wt 136 lb 8 oz (61.9 kg)   LMP 01/28/2019   SpO2 98%   BMI 22.20 kg/m   Wt Readings from Last 3 Encounters:  02/07/19 136 lb 8 oz (61.9 kg)  06/28/18 130 lb 9.6 oz (59.2 kg)  03/22/18 130 lb (59 kg)    Physical Exam Vitals signs and nursing note reviewed.  Constitutional:      Appearance: Normal appearance. She is not ill-appearing.  Musculoskeletal: Normal range of motion.        General:  Swelling and tenderness present.     Comments:  FROM at bilateral elbows  2+ rad pulses bilaterally Swollen tender R dorsal wrist into forearm without palpable cords, mild erythema and minimal warmth present. FROM at wrists.   Skin:    Capillary Refill: Capillary refill takes less than 2 seconds.  Neurological:     General: No focal deficit present.     Mental Status: She is alert.     Comments: Bilateral hands neurovascularly intact       Assessment & Plan:   Problem List Items Addressed This Visit    Superficial phlebitis of arm    Clinical diagnosis after L hand IV infiltration 2 d ago for migraine cocktail at ER. Treat with elevation, heating pad, oral and topical NSAIDs. WASP for doxy antibiotic provided with indications when to fill.  She is on oral contraceptives  however I don't appreciate any palpable cords to suggest thrombosis.  Red flags to seek further care reviewed.          Meds ordered this encounter  Medications  . diclofenac sodium (VOLTAREN) 1 % GEL    Sig: Apply 2 g topically 3 (three) times daily.    Dispense:  100 g    Refill:  0  . doxycycline (VIBRA-TABS) 100 MG tablet    Sig: Take 1 tablet (100 mg total) by mouth 2 (two) times daily.    Dispense:  14 tablet    Refill:  0   No orders of the defined types were placed in this encounter.   Follow up plan: Return if symptoms worsen or fail to improve.  Eustaquio BoydenJavier Kamiah Fite, MD

## 2019-02-07 NOTE — Telephone Encounter (Signed)
Pt is already scheduled for 02/07/19 @ 2:30

## 2019-02-07 NOTE — Telephone Encounter (Signed)
[place] Night - Client Nonclinical Telephone Record AccessNurse Client [place] Night - Client Client Site [facility] Physician Renford Dills - MD Contact Type Call Who Is Calling Patient / Member / Family / Caregiver Caller Name [name] Caller Phone Number [phone] Patient Name Jessica [name] Patient DOB 09-Aug-1995 Call Type Message Only Information Provided Reason for Call Request to Schedule Office Appointment Initial Comment Caller states she would like to make an appt tomorrow. Additional Comment Call Closed By: Paulita Cradle Transaction Date/Time: 02/06/2019 11:10:12 PM (ET)

## 2019-02-10 ENCOUNTER — Encounter: Payer: Self-pay | Admitting: Family Medicine

## 2019-02-11 ENCOUNTER — Telehealth: Payer: Self-pay | Admitting: *Deleted

## 2019-02-11 NOTE — Telephone Encounter (Signed)
PA for Diclofenac Gel submitted thru CMM, awaiting response. 

## 2019-02-12 DIAGNOSIS — Z20828 Contact with and (suspected) exposure to other viral communicable diseases: Secondary | ICD-10-CM | POA: Diagnosis not present

## 2019-02-12 DIAGNOSIS — R0989 Other specified symptoms and signs involving the circulatory and respiratory systems: Secondary | ICD-10-CM | POA: Diagnosis not present

## 2019-02-13 ENCOUNTER — Other Ambulatory Visit: Payer: Self-pay | Admitting: Family Medicine

## 2019-02-13 MED ORDER — PANTOPRAZOLE SODIUM 40 MG PO TBEC: 40.0000 mg | DELAYED_RELEASE_TABLET | Freq: Every day | ORAL | 3 refills | Status: DC

## 2019-02-20 NOTE — Telephone Encounter (Signed)
PA was not approved. Per Dr> Damita Dunnings to see if patient can check to see if topical solution would be cheaper. Also this medication is over the counter now. Called patient no answer and voicemail is full. Sending mychart also.

## 2019-02-20 NOTE — Telephone Encounter (Signed)
FYI to PCP patient responded through mychart. She did not realize that Voltaren was sent in as a prescription and she already picked this medication up OTC. No further assistance needed at this time.

## 2019-02-20 NOTE — Telephone Encounter (Signed)
Thanks

## 2019-04-16 ENCOUNTER — Telehealth: Payer: Self-pay

## 2019-04-16 MED ORDER — JUNEL 1.5/30 1.5-30 MG-MCG PO TABS: 1.0000 | ORAL_TABLET | Freq: Every day | ORAL | 0 refills | Status: DC

## 2019-04-16 NOTE — Telephone Encounter (Signed)
Jessica [name] at [facility] left v/m that pt is a new customer of [facility] and does not have transferrable refill for [facility] pill. Request new rx for [facility] pill sent to [facility] pharmacy.

## 2019-04-16 NOTE — Telephone Encounter (Signed)
Refill sent as requested.  Jessica [name] is scheduled for CPE 05/09/2019.

## 2019-04-17 NOTE — Telephone Encounter (Signed)
With 1 week of inactive pills per 90 day supply.  Thanks.

## 2019-04-17 NOTE — Telephone Encounter (Signed)
Phillip at [facility] left v/m; Lenda Kelp 1.5/30 rx received but wants to know if Dr Damita Dunnings wants 1 week of inactive pills (iron pills) or 84 straight days of active pills. Phillip request cb.

## 2019-04-17 NOTE — Telephone Encounter (Signed)
Left detailed message on voicemail of pharmacy. 

## 2019-04-24 ENCOUNTER — Other Ambulatory Visit (INDEPENDENT_AMBULATORY_CARE_PROVIDER_SITE_OTHER): Payer: Self-pay | Admitting: Pediatrics

## 2019-04-24 DIAGNOSIS — G43009 Migraine without aura, not intractable, without status migrainosus: Secondary | ICD-10-CM

## 2019-04-24 NOTE — Telephone Encounter (Signed)
Please send to the pharmacy °

## 2019-05-02 ENCOUNTER — Telehealth: Payer: Self-pay

## 2019-05-02 NOTE — Telephone Encounter (Signed)
LVM w COVID screen, back lab info and front door info 

## 2019-05-06 ENCOUNTER — Other Ambulatory Visit: Payer: Self-pay

## 2019-05-06 ENCOUNTER — Other Ambulatory Visit: Payer: Self-pay | Admitting: Family Medicine

## 2019-05-06 ENCOUNTER — Other Ambulatory Visit (INDEPENDENT_AMBULATORY_CARE_PROVIDER_SITE_OTHER): Payer: No Typology Code available for payment source

## 2019-05-06 DIAGNOSIS — G43009 Migraine without aura, not intractable, without status migrainosus: Secondary | ICD-10-CM

## 2019-05-06 DIAGNOSIS — I951 Orthostatic hypotension: Secondary | ICD-10-CM

## 2019-05-06 LAB — COMPREHENSIVE METABOLIC PANEL
ALT: 27 U/L (ref 0–35)
AST: 18 U/L (ref 0–37)
Albumin: 4.3 g/dL (ref 3.5–5.2)
Alkaline Phosphatase: 51 U/L (ref 39–117)
BUN: 13 mg/dL (ref 6–23)
CO2: 24 mEq/L (ref 19–32)
Calcium: 9.3 mg/dL (ref 8.4–10.5)
Chloride: 109 mEq/L (ref 96–112)
Creatinine, Ser: 0.83 mg/dL (ref 0.40–1.20)
GFR: 84.53 mL/min (ref >60.00)
Glucose, Bld: 90 mg/dL (ref 70–99)
Potassium: 3.9 mEq/L (ref 3.5–5.1)
Sodium: 140 mEq/L (ref 135–145)
Total Bilirubin: 0.4 mg/dL (ref 0.2–1.2)
Total Protein: 7.1 g/dL (ref 6.0–8.3)

## 2019-05-06 LAB — CBC WITH DIFFERENTIAL/PLATELET
Basophils Absolute: 0.2 10*3/uL — ABNORMAL HIGH (ref 0.0–0.1)
Basophils Relative: 2.5 % (ref 0.0–3.0)
Eosinophils Absolute: 0.2 10*3/uL (ref 0.0–0.7)
Eosinophils Relative: 2.3 % (ref 0.0–5.0)
HCT: 43.8 % (ref 36.0–46.0)
Hemoglobin: 14.3 g/dL (ref 12.0–15.0)
Lymphocytes Relative: 38.5 % (ref 12.0–46.0)
Lymphs Abs: 2.6 10*3/uL (ref 0.7–4.0)
MCHC: 32.8 g/dL (ref 30.0–36.0)
MCV: 91.8 fl (ref 78.0–100.0)
Monocytes Absolute: 0.5 10*3/uL (ref 0.1–1.0)
Monocytes Relative: 8 % (ref 3.0–12.0)
Neutro Abs: 3.3 10*3/uL (ref 1.4–7.7)
Neutrophils Relative %: 48.7 % (ref 43.0–77.0)
Platelets: 290 10*3/uL (ref 150.0–400.0)
RBC: 4.77 Mil/uL (ref 3.87–5.11)
RDW: 12.2 % (ref 11.5–15.5)
WBC: 6.8 10*3/uL (ref 4.0–10.5)

## 2019-05-09 ENCOUNTER — Ambulatory Visit (INDEPENDENT_AMBULATORY_CARE_PROVIDER_SITE_OTHER): Payer: No Typology Code available for payment source | Admitting: Family Medicine

## 2019-05-09 ENCOUNTER — Other Ambulatory Visit: Payer: Self-pay

## 2019-05-09 ENCOUNTER — Encounter: Payer: Self-pay | Admitting: Family Medicine

## 2019-05-09 VITALS — BP 126/88 | HR 98 | Temp 98.1°F | Ht 65.25 in | Wt 144.2 lb

## 2019-05-09 DIAGNOSIS — R05 Cough: Secondary | ICD-10-CM

## 2019-05-09 DIAGNOSIS — Z0000 Encounter for general adult medical examination without abnormal findings: Secondary | ICD-10-CM | POA: Diagnosis not present

## 2019-05-09 DIAGNOSIS — R059 Cough, unspecified: Secondary | ICD-10-CM

## 2019-05-09 DIAGNOSIS — G43009 Migraine without aura, not intractable, without status migrainosus: Secondary | ICD-10-CM

## 2019-05-09 DIAGNOSIS — G90A Postural orthostatic tachycardia syndrome (POTS): Secondary | ICD-10-CM

## 2019-05-09 DIAGNOSIS — Z7189 Other specified counseling: Secondary | ICD-10-CM

## 2019-05-09 DIAGNOSIS — R197 Diarrhea, unspecified: Secondary | ICD-10-CM

## 2019-05-09 DIAGNOSIS — F4323 Adjustment disorder with mixed anxiety and depressed mood: Secondary | ICD-10-CM

## 2019-05-09 MED ORDER — LOPERAMIDE HCL 2 MG PO TABS: 1.0000 mg | ORAL_TABLET | Freq: Every day | ORAL | Status: DC | PRN

## 2019-05-09 MED ORDER — FLOVENT HFA 110 MCG/ACT IN AERO: 1.0000 | INHALATION_SPRAY | Freq: Two times a day (BID) | RESPIRATORY_TRACT | 12 refills | Status: DC

## 2019-05-09 NOTE — Patient Instructions (Addendum)
We'll call about seeing Dr. Jaynee Eagles.   Thanks for getting a flu shot.  Try using flovent twice a day, rinse after use.   Update me in about 2 weeks, sooner if needed.  Try 1/2 tab imodium daily as needed.  If not tolerated or better, then we can set you up with GI.   Take care.  Glad to see you.

## 2019-05-09 NOTE — Progress Notes (Signed)
CPE- See plan.  Routine anticipatory guidance given to patient.  See health maintenance.  The possibility exists that previously documented standard health maintenance information may have been brought forward from a previous encounter into this note.  If needed, that same information has been updated to reflect the current situation based on today's encounter.    Tetanus 2016 Flu done at work.  PNA and shingles not due.  Colon CA screening not due.   Mammogram/DXA not due  Pap smear not due.   Advanced directive discussed with patient.  She would have her husband designated if she were incapacitated.  Frequent diarrhea.  This is a longstanding issue for the patient.  No blood in stool.  No vomiting.  No fevers.  GERD is better but cough persists in spite of PPI.  No sputum. Some post tussive emesis.  No wheeze.  Mother has h/o asthma, sister has asthma.  We talked about avoiding beta agonist given her history of tachycardia.  See plan.  Mood d/w pt.  Still on prozac, w/o ADE, with some relief.  Stressors at work d/w pt.  No SI/HI.  Overall with some benefit from medication.  POTS d/w pt.  No ADE on meds.  She is off atenolol, was able to wean off.  Still on florinef.  No sx.  No heart racing.  No syncope.  Doing well overall in terms of POTS.   Migraines.  Working nights, swing shifts.  She had to leave work once from migraine.  She is off atenolol and that may contribute.  Discussed follow-up with neurology. PMH and SH reviewed  Meds, vitals, and allergies reviewed.   ROS: Per HPI.  Unless specifically indicated otherwise in HPI, the patient denies:  General: fever. Eyes: acute vision changes ENT: sore throat Cardiovascular: chest pain Respiratory: SOB GI: vomiting GU: dysuria Musculoskeletal: acute back pain Derm: acute rash Neuro: acute motor dysfunction Psych: worsening mood Endocrine: polydipsia Heme: bleeding Allergy: hayfever  GEN: nad, alert and oriented HEENT:  ncat NECK: supple w/o LA CV: rrr. PULM: ctab, no inc wob ABD: soft, +bs EXT: no edema SKIN: no acute rash

## 2019-05-09 NOTE — Assessment & Plan Note (Signed)
Advanced directive discussed with patient.  She would have her husband designated if she were incapacitated. 

## 2019-05-11 DIAGNOSIS — R059 Cough, unspecified: Secondary | ICD-10-CM | POA: Insufficient documentation

## 2019-05-11 DIAGNOSIS — R05 Cough: Secondary | ICD-10-CM | POA: Insufficient documentation

## 2019-05-11 DIAGNOSIS — R197 Diarrhea, unspecified: Secondary | ICD-10-CM | POA: Insufficient documentation

## 2019-05-11 DIAGNOSIS — Z0000 Encounter for general adult medical examination without abnormal findings: Secondary | ICD-10-CM | POA: Insufficient documentation

## 2019-05-11 NOTE — Assessment & Plan Note (Signed)
Refer to neurology.  No change in meds at this point.  Unclear how much of her increase in migraines is related to weaning off of beta-blocker.  Discussed.

## 2019-05-11 NOTE — Assessment & Plan Note (Signed)
Discussed with patient about options.  Reasonable to try 1/2 tab imodium daily as needed.  If not tolerated or better, then we can set her up with GI.  She will update me as needed.  She agrees to plan.

## 2019-05-11 NOTE — Assessment & Plan Note (Signed)
Tetanus 2016 Flu done at work.  PNA and shingles not due.  Colon CA screening not due.   Mammogram/DXA not due  Pap smear not due.   Advanced directive discussed with patient.  She would have her husband designated if she were incapacitated.

## 2019-05-11 NOTE — Assessment & Plan Note (Signed)
  POTS d/w pt.  No ADE on meds.  She is off atenolol, was able to wean off.  Still on florinef.  No sx.  No heart racing.  No syncope.  Doing well overall in terms of POTS.  Would continue as is.  She agrees.

## 2019-05-11 NOTE — Assessment & Plan Note (Signed)
GERD is better but cough persists in spite of PPI.  No sputum. Some post tussive emesis.  No wheeze.  Mother has h/o asthma, sister has asthma.  We talked about avoiding beta agonist given her history of tachycardia.   Reasonable to try Flovent with routine cautions and instructions.  She will update me about her situation.  Lungs are clear on exam.

## 2019-05-11 NOTE — Assessment & Plan Note (Signed)
Mood d/w pt.  Still on prozac, w/o ADE, with some relief.  Stressors at work d/w pt.  No SI/HI.  Overall with some benefit from medication.  Would continue as is.

## 2019-05-13 ENCOUNTER — Encounter: Payer: Self-pay | Admitting: Family Medicine

## 2019-05-27 ENCOUNTER — Other Ambulatory Visit: Payer: Self-pay

## 2019-05-31 ENCOUNTER — Encounter: Payer: Self-pay | Admitting: Family Medicine

## 2019-06-04 ENCOUNTER — Other Ambulatory Visit: Payer: Self-pay | Admitting: Family Medicine

## 2019-06-04 MED ORDER — ATENOLOL 25 MG PO TABS: 12.5000 mg | ORAL_TABLET | Freq: Every day | ORAL | Status: DC

## 2019-06-05 ENCOUNTER — Other Ambulatory Visit: Payer: Self-pay | Admitting: *Deleted

## 2019-06-05 MED ORDER — ATENOLOL 25 MG PO TABS: ORAL_TABLET | ORAL | 1 refills | Status: DC

## 2019-06-05 NOTE — Telephone Encounter (Signed)
Refill sent to [facility] as requested by patient.

## 2019-06-12 ENCOUNTER — Encounter (INDEPENDENT_AMBULATORY_CARE_PROVIDER_SITE_OTHER): Payer: Self-pay

## 2019-06-15 ENCOUNTER — Encounter: Payer: Self-pay | Admitting: Family Medicine

## 2019-06-17 ENCOUNTER — Telehealth: Payer: Self-pay | Admitting: Family Medicine

## 2019-06-17 DIAGNOSIS — R109 Unspecified abdominal pain: Secondary | ICD-10-CM

## 2019-06-17 DIAGNOSIS — R55 Syncope and collapse: Secondary | ICD-10-CM

## 2019-06-17 NOTE — Telephone Encounter (Signed)
Pt said she thinks she is doing fine now and will see how she does before scheduling an appt with Dr Damita Dunnings. Offered pt appt this afternoon but pt said she does not want to see a random doctor. Pt is willing to do cardiology referral and has been a long time since saw a GI doctor and wants to know if Dr Damita Dunnings thinks it would be beneficial to see GI also. UC & ED precautions given and pt voiced understanding.

## 2019-06-17 NOTE — Telephone Encounter (Signed)
Please call patient.  Please triage her situation regarding syncope.  Offer office visit.  I think it makes sense for her to see cardiology given the recurrent syncope.  I went ahead and put in the referral.  Thanks.

## 2019-06-18 NOTE — Telephone Encounter (Signed)
Spoken and notified patient of Dr Duncan's comments. Patient verbalized understanding.  

## 2019-06-18 NOTE — Addendum Note (Signed)
Addended by: Tonia Ghent on: 06/18/2019 07:56 AM   Modules accepted: Orders

## 2019-06-18 NOTE — Telephone Encounter (Signed)
I think seeing the GI clinic also makes sense.  If she is acutely worse in the meantime or still having any recurrent symptoms then I want her to get checked here.  I put in the referral to the GI clinic.  Thanks.

## 2019-06-24 ENCOUNTER — Encounter: Payer: Self-pay | Admitting: Gastroenterology

## 2019-06-26 ENCOUNTER — Encounter: Payer: Self-pay | Admitting: Cardiovascular Disease

## 2019-06-26 ENCOUNTER — Ambulatory Visit (INDEPENDENT_AMBULATORY_CARE_PROVIDER_SITE_OTHER): Payer: No Typology Code available for payment source | Admitting: Cardiovascular Disease

## 2019-06-26 ENCOUNTER — Other Ambulatory Visit: Payer: Self-pay

## 2019-06-26 ENCOUNTER — Telehealth: Payer: Self-pay | Admitting: Radiology

## 2019-06-26 VITALS — BP 100/66 | HR 83 | Temp 98.4°F | Ht 66.0 in | Wt 144.0 lb

## 2019-06-26 DIAGNOSIS — G909 Disorder of the autonomic nervous system, unspecified: Secondary | ICD-10-CM

## 2019-06-26 DIAGNOSIS — R55 Syncope and collapse: Secondary | ICD-10-CM

## 2019-06-26 DIAGNOSIS — Z13220 Encounter for screening for lipoid disorders: Secondary | ICD-10-CM

## 2019-06-26 DIAGNOSIS — Z79899 Other long term (current) drug therapy: Secondary | ICD-10-CM

## 2019-06-26 DIAGNOSIS — Z8669 Personal history of other diseases of the nervous system and sense organs: Secondary | ICD-10-CM

## 2019-06-26 DIAGNOSIS — K580 Irritable bowel syndrome with diarrhea: Secondary | ICD-10-CM

## 2019-06-26 NOTE — Patient Instructions (Signed)
Medication Instructions:  Continue current medications  *If you need a refill on your cardiac medications before your next appointment, please call your pharmacy*  Lab Work: Fasting Lipids, TSH and Magnesium  If you have labs (blood work) drawn today and your tests are completely normal, you will receive your results only by: Marland Kitchen MyChart Message (if you have MyChart) OR . A paper copy in the mail If you have any lab test that is abnormal or we need to change your treatment, we will call you to review the results.  Testing/Procedures: Your physician has recommended that you wear an 30 day event monitor. Event monitors are medical devices that record the heart's electrical activity. Doctors most often [place] these monitors to diagnose arrhythmias. Arrhythmias are problems with the speed or rhythm of the heartbeat. The monitor is a small, portable device. You can wear one while you do your normal daily activities. This is usually used to diagnose what is causing palpitations/syncope (passing out).  Your physician has requested that you have an echocardiogram. Echocardiography is a painless test that uses sound waves to create images of your heart. It provides your doctor with information about the size and shape of your heart and how well your heart's chambers and valves are working. This procedure takes approximately one hour. There are no restrictions for this procedure.    Follow-Up: At [facility] you and your health needs are our priority.  As part of our continuing mission to provide you with exceptional heart care, we have created designated Provider Care Teams.  These Care Teams include your primary Cardiologist (physician) and Advanced Practice Providers (APPs -  Physician Assistants and Nurse Practitioners) who all work together to provide you with the care you need, when you need it.  Your next appointment:   2 month(s)  The format for your next appointment:   In Person  Provider:    Shelva Majestic, MD  Other Instructions

## 2019-06-26 NOTE — Telephone Encounter (Signed)
Enrolled patient for a 30 day [employer] Event monitor to be mailed to patients home.  

## 2019-06-26 NOTE — Progress Notes (Signed)
Cardiology Office Note    Date:  06/28/2019   ID:  Jessica [name], DOB 25-Mar-1996, MRN [id number]  PCP:  Tonia Ghent, MD  Cardiologist:  Shelva Majestic, MD   Chief Complaint  Patient presents with   New Patient (Initial Visit)   Loss of Consciousness    History of Present Illness:  Jessica [name] is a 23 y.o. female presents to establish cardiology care and further evaluation of syncope.  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  She has a history of recurrent syncopal spells which commenced in 2012.  She has been previously evaluated at [facility] by pediatric cardiology and underwent initial evaluation in November 2012.  Apparently she had a normal ECG, Holter and echocardiogram.  She has not had extensive testing for noncardiac disorders that can mimic autonomic dysfunction.  She has been treated with atenolol, Florinef, and midodrine in the past.  Initially she had significant improvement on atenolol and had modest improvement with Florinef.  Remotely midodrine was discontinued due to some anorexia.  Apparently, she is felt most likely to have vasovagal syncope.  There has been some discussion of possible pots syndrome.  She has not had a tilt table test.  She was last seen by Dr. Riccardo Dubin in September 2019.  She now currently lives in [place] and would like to transition care here.  She denies any episodes of chest pain.  Recently she was doing exceptionally well and had weaned herself off the atenolol.  However she has had 2 episodes of syncope and is started back on atenolol.  She does experience an increased heartbeat prior to syncopal spells.  Presently she is on atenolol 12.5 mg daily in addition to Florinef 0.1 mg.  She has a history of migraine headaches and takes topiramate ER 150 mg at bedtime.  She has a prescription for Imitrex as needed.  She also is on Prozac 40 mg daily.  She states that her 2 recent syncopal episode seem to occur after developing sharp stomach  pains.  She has a diagnosis of irritable bowel syndrome and will be seeing a gastroenterologist.  She presents for initial evaluation.   Past Medical History:  Diagnosis Date   Adjustment disorder with mixed anxiety and depressed mood    Anorexia nervosa    Autonomic dysfunction    Migraine headache    POTS (postural orthostatic tachycardia syndrome)    Splenomegaly    Syncope 09/22/2014   Vasovagal syncope     Past Surgical History:  Procedure Laterality Date   ANTERIOR CRUCIATE LIGAMENT REPAIR  03/2010   EYE SURGERY  2000   clogged tear duct   WISDOM TOOTH EXTRACTION      Current Medications: Outpatient Medications Prior to Visit  Medication Sig Dispense Refill   atenolol (TENORMIN) 25 MG tablet Take one half tablet (12.5 mg total) or 1 (25 mg total) per day 30 tablet 1   fludrocortisone (FLORINEF) 0.1 MG tablet Take 1 tablet by mouth daily 90 tablet 3   FLUoxetine (PROZAC) 40 MG capsule Take 1 capsule (40 mg total) by mouth daily. 90 capsule 3   fluticasone (FLOVENT HFA) 110 MCG/ACT inhaler Inhale 1 puff into the lungs 2 (two) times daily. Rinse after use. 1 Inhaler 12   ibuprofen (ADVIL,MOTRIN) 800 MG tablet Take 1 tablet (800 mg total) by mouth every 8 (eight) hours as needed for mild pain or moderate pain. 15 tablet 0   JUNEL 1.5/30 1.5-30 MG-MCG tablet Take  1 tablet by mouth daily. 84 tablet 0   pantoprazole (PROTONIX) 40 MG tablet Take 1 tablet (40 mg total) by mouth daily. 30 tablet 3   QUDEXY XR 150 MG CS24 sprinkle capsule TAKE 1 CAPSULE (150 MG TOTAL) BY MOUTH AT BEDTIME. 30 capsule 1   SUMAtriptan (IMITREX) 25 MG tablet Take 1 tablet (25 mg total) by mouth every 2 (two) hours as needed for migraine. May repeat in 2 hours if headache persists or recurs. 10 tablet 5   loperamide (IMODIUM A-D) 2 MG tablet Take 0.5 tablets (1 mg total) by mouth daily as needed for diarrhea or loose stools.     No facility-administered medications prior to visit.       Allergies:   Amoxicillin   Social History   Socioeconomic History   Marital status: Single    Spouse name: Not on file   Number of children: Not on file   Years of education: Not on file   Highest education level: Not on file  Occupational History   Occupation: student    Comment: [place] [employer]   Financial resource strain: Not on file   Food insecurity    Worry: Not on file    Inability: Not on file   Transportation needs    Medical: Not on file    Non-medical: Not on file  Tobacco Use   Smoking status: Never Smoker   Smokeless tobacco: Never Used  Substance and Sexual Activity   Alcohol use: Yes    Alcohol/week: 0.0 standard drinks    Comment: Drinks on occasion    Drug use: No   Sexual activity: Never    Birth control/protection: Pill  Lifestyle   Physical activity    Days per week: Not on file    Minutes per session: Not on file   Stress: Not on file  Relationships   Social connections    Talks on phone: Not on file    Gets together: Not on file    Attends religious service: Not on file    Active member of club or organization: Not on file    Attends meetings of clubs or organizations: Not on file    Relationship status: Not on file  Other Topics Concern   Not on file  Social History Narrative   RN at [employer]   She is engaged to be married on August 03, 2018.   She lives with her parents and siblings.     Additional social history is notable that she is now married for close to a year.  Family History:  The patient's family history includes Breast cancer in her maternal aunt; Cancer in her maternal aunt and maternal grandfather; Depression in an other family member; Diabetes in her maternal grandfather and maternal grandmother; Eating disorder in her maternal aunt; Hypertension in her father; Kidney Stones in her father; Mental retardation in an other family member; Migraines in her maternal aunt, maternal  uncle, and mother; Other in an other family member; Seizures in her father and mother; Stroke in her maternal grandfather.   ROS General: Negative; No fevers, chills, or night sweats;  HEENT: Negative; No changes in vision or hearing, sinus congestion, difficulty swallowing Pulmonary: Negative; No cough, wheezing, shortness of breath, hemoptysis Cardiovascular: Negative; No chest pain, presyncope, syncope, palpitations GI: Negative; No nausea, vomiting, diarrhea, or abdominal pain GU: Negative; No dysuria, hematuria, or difficulty voiding Musculoskeletal: Negative; no myalgias, joint pain, or weakness Hematologic/Oncology: Negative; no  easy bruising, bleeding Endocrine: Negative; no heat/cold intolerance; no diabetes Neuro: History of migraine headaches Skin: Negative; No rashes or skin lesions Psychiatric: On Prozac Sleep: Negative; No snoring, daytime sleepiness, hypersomnolence, bruxism, restless legs, hypnogognic hallucinations, no cataplexy Other comprehensive 14 point system review is negative.   PHYSICAL EXAM:   VS:  BP 100/66 (BP Location: Left Arm, Patient Position: Sitting, Cuff Size: Normal)    Pulse 83    Temp 98.4 F (36.9 C)    Ht 5' 6"  (1.676 m)    Wt 144 lb (65.3 kg)    BMI 23.24 kg/m     Supine blood pressure 102/63; sitting blood pressure 106/78; standing blood pressure after 1 minute 111/73; standing blood pressure after 3 minutes 105/74  Wt Readings from Last 3 Encounters:  06/26/19 144 lb (65.3 kg)  05/09/19 144 lb 4 oz (65.4 kg)  02/07/19 136 lb 8 oz (61.9 kg)    General: Alert, oriented, no distress.  Skin: normal turgor, no rashes, warm and dry HEENT: Normocephalic, atraumatic. Pupils equal round and reactive to light; sclera anicteric; extraocular muscles intact;  Nose without nasal septal hypertrophy Mouth/Parynx benign; Mallinpatti scale 2 Neck: No JVD, no carotid bruits; normal carotid upstroke Lungs: clear to ausculatation and percussion; no wheezing  or rales Chest wall: without tenderness to palpitation Heart: PMI not displaced, RRR, s1 s2 normal, 1/6 systolic murmur, no diastolic murmur, no rubs, gallops, thrills, or heaves Abdomen: soft, nontender; no hepatosplenomehaly, BS+; abdominal aorta nontender and not dilated by palpation. Back: no CVA tenderness Pulses 2+ Musculoskeletal: full range of motion, normal strength, no joint deformities Extremities: no clubbing cyanosis or edema, Homan's sign negative  Neurologic: grossly nonfocal; Cranial nerves grossly wnl Psychologic: Normal mood and affect   Studies/Labs Reviewed:   EKG:  EKG is ordered today.  ECG (independently read by me): Normal sinus rhythm at 83 bpm.  No ST segment changes.  No ectopy.  Normal intervals with a PR interval of 158 ms and a QTc interval of 455 ms.  Recent Labs: BMP Latest Ref Rng & Units 05/06/2019 02/05/2019 03/22/2018  Glucose 70 - 99 mg/dL 90 110(H) 110(H)  BUN 6 - 23 mg/dL 13 13 13   Creatinine 0.40 - 1.20 mg/dL 0.83 0.71 0.88  BUN/Creat Ratio 9 - 23 - - -  Sodium 135 - 145 mEq/L 140 140 139  Potassium 3.5 - 5.1 mEq/L 3.9 3.4(L) 4.4  Chloride 96 - 112 mEq/L 109 113(H) 107  CO2 19 - 32 mEq/L 24 20(L) 26  Calcium 8.4 - 10.5 mg/dL 9.3 9.1 9.9     Hepatic Function Latest Ref Rng & Units 05/06/2019 03/22/2018 06/07/2017  Total Protein 6.0 - 8.3 g/dL 7.1 7.6 7.6  Albumin 3.5 - 5.2 g/dL 4.3 4.7 4.3  AST 0 - 37 U/L 18 18 31   ALT 0 - 35 U/L 27 27 39  Alk Phosphatase 39 - 117 U/L 51 41 45  Total Bilirubin 0.2 - 1.2 mg/dL 0.4 0.3 0.2(L)    CBC Latest Ref Rng & Units 05/06/2019 02/05/2019 03/22/2018  WBC 4.0 - 10.5 K/uL 6.8 6.9 6.6  Hemoglobin 12.0 - 15.0 g/dL 14.3 14.1 14.4  Hematocrit 36.0 - 46.0 % 43.8 42.7 43.4  Platelets 150.0 - 400.0 K/uL 290.0 292 291.0   Lab Results  Component Value Date   MCV 91.8 05/06/2019   MCV 91.6 02/05/2019   MCV 92.2 03/22/2018   Lab Results  Component Value Date   TSH 1.030 06/19/2011   Lab Results  Component Value Date   HGBA1C 5.4 04/29/2015     BNP No results found for: BNP  ProBNP No results found for: PROBNP   Lipid Panel     Component Value Date/Time   CHOL 187 (H) 04/29/2015 1442   TRIG 116 04/29/2015 1442   HDL 38 04/29/2015 1442   CHOLHDL 4.9 04/29/2015 1442   VLDL 23 04/29/2015 1442   LDLCALC 126 (H) 04/29/2015 1442     RADIOLOGY: No results found.   Additional studies/ records that were reviewed today include:  I have reviewed the records from [facility] pediatric cardiology, Dr. Gates Rigg  ASSESSMENT:    1. Syncope, unspecified syncope type   2. Autonomic dysfunction   3. History of migraine headaches   4. Irritable bowel syndrome with diarrhea   5. Medication management   6. Screening for lipid disorders    PLAN:  Jessica [name] is a very pleasant 23 year old female recently married a past year who has a history of recurrent episodes syncope dating back to 2012.  She has been evaluated by pediatric cardiology and reportedly had undergone an echo, Holter, and had consistent normal ECGs.  Her symptoms have benefited from a atenolol and Florinef and in the past apparently was also treated with midodrine but developed worsening anorexia.  She had weaned herself off atenolol but apparently over the last several months has had 2 episodes of recurrent syncope.  Each episode seem to occur after first development of sharp abdominal discomfort in her lower abdomen.  She would then experience a sense of tachycardia and blackout.  She has now been back on a atenolol at 12.5 mg daily.  She has history suggestive of irritable bowel syndrome with predominant diarrhea.  She will be establishing with gastroenterology.  Presently, she has normal blood pressure without  orthostatic drop going from supine to standing.  She continues to be on Florinef 0.1 mg daily. She apparently has had a diagnosis of autonomic insufficiency as well as possible vasovagal syncope.   She has not been felt to have Pot's syndrome.  She has never had a tilt table test.  I reviewed recent laboratory.  However I am recommending a TSH level, magnesium level and will also check a lipid panel.  I am scheduling her for follow-up echo Doppler study.  I will have her wear a 30-day event monitor to see if she does have any arrhythmia.  There is no chest pain.  Her ECG today is stable with sinus rhythm at 83.  QTc interval is normal.  I will see her back in the office in 2 to 3 months for follow-up evaluation and further recommendations will be made at that time.  Medication Adjustments/Labs and Tests Ordered: Current medicines are reviewed at length with the patient today.  Concerns regarding medicines are outlined above.  Medication changes, Labs and Tests ordered today are listed in the Patient Instructions below. Patient Instructions  Medication Instructions:  Continue current medications  *If you need a refill on your cardiac medications before your next appointment, please call your pharmacy*  Lab Work: Fasting Lipids, TSH and Magnesium  If you have labs (blood work) drawn today and your tests are completely normal, you will receive your results only by:  [place] (if you have MyChart) OR  A paper copy in the mail If you have any lab test that is abnormal or we need to change your treatment, we will call you to review the results.  Testing/Procedures: Your  physician has recommended that you wear an 30 day event monitor. Event monitors are medical devices that record the hearts electrical activity. Doctors most often [place] these monitors to diagnose arrhythmias. Arrhythmias are problems with the speed or rhythm of the heartbeat. The monitor is a small, portable device. You can wear one while you do your normal daily activities. This is usually used to diagnose what is causing palpitations/syncope (passing out).  Your physician has requested that you have an echocardiogram.  Echocardiography is a painless test that uses sound waves to create images of your heart. It provides your doctor with information about the size and shape of your heart and how well your hearts chambers and valves are working. This procedure takes approximately one hour. There are no restrictions for this procedure.    Follow-Up: At [facility], you and your health needs are our priority.  As part of our continuing mission to provide you with exceptional heart care, we have created designated Provider Care Teams.  These Care Teams include your primary Cardiologist (physician) and Advanced Practice Providers (APPs -  Physician Assistants and Nurse Practitioners) who all work together to provide you with the care you need, when you need it.  Your next appointment:   2 month(s)  The format for your next appointment:   In Person  Provider:   Shelva Majestic, MD  Other Instructions      Signed, Shelva Majestic, MD  06/28/2019 6:19 PM    [place] [address], [place], [place], [place]  [address] Phone: [phone]

## 2019-06-28 ENCOUNTER — Encounter: Payer: Self-pay | Admitting: Cardiovascular Disease

## 2019-07-02 ENCOUNTER — Encounter (INDEPENDENT_AMBULATORY_CARE_PROVIDER_SITE_OTHER): Payer: No Typology Code available for payment source

## 2019-07-02 DIAGNOSIS — R55 Syncope and collapse: Secondary | ICD-10-CM | POA: Diagnosis not present

## 2019-07-03 ENCOUNTER — Other Ambulatory Visit: Payer: Self-pay

## 2019-07-03 ENCOUNTER — Ambulatory Visit (HOSPITAL_COMMUNITY): Payer: No Typology Code available for payment source | Attending: Cardiology

## 2019-07-03 DIAGNOSIS — R55 Syncope and collapse: Secondary | ICD-10-CM | POA: Diagnosis not present

## 2019-07-15 ENCOUNTER — Encounter: Payer: Self-pay | Admitting: Family Medicine

## 2019-07-16 ENCOUNTER — Other Ambulatory Visit: Payer: Self-pay | Admitting: Family Medicine

## 2019-07-16 MED ORDER — TRIAMCINOLONE ACETONIDE 0.1 % EX CREA: 1.0000 "application " | TOPICAL_CREAM | Freq: Two times a day (BID) | CUTANEOUS | 0 refills | Status: DC

## 2019-07-21 ENCOUNTER — Ambulatory Visit (INDEPENDENT_AMBULATORY_CARE_PROVIDER_SITE_OTHER): Payer: No Typology Code available for payment source | Admitting: Neurology

## 2019-07-21 ENCOUNTER — Encounter: Payer: Self-pay | Admitting: Neurology

## 2019-07-21 ENCOUNTER — Other Ambulatory Visit: Payer: Self-pay

## 2019-07-21 VITALS — BP 113/72 | HR 89 | Temp 98.3°F | Ht 66.0 in | Wt 144.6 lb

## 2019-07-21 DIAGNOSIS — G43709 Chronic migraine without aura, not intractable, without status migrainosus: Secondary | ICD-10-CM | POA: Diagnosis not present

## 2019-07-21 DIAGNOSIS — G43909 Migraine, unspecified, not intractable, without status migrainosus: Secondary | ICD-10-CM | POA: Insufficient documentation

## 2019-07-21 MED ORDER — TOPIRAMATE ER 100 MG PO SPRINKLE CAP24: 100.0000 mg | EXTENDED_RELEASE_CAPSULE | Freq: Every day | ORAL | 1 refills | Status: DC

## 2019-07-21 MED ORDER — NURTEC 75 MG PO TBDP: 75.0000 mg | ORAL_TABLET | Freq: Every day | ORAL | 6 refills | Status: DC | PRN

## 2019-07-21 MED ORDER — RIZATRIPTAN BENZOATE 10 MG PO TBDP: 10.0000 mg | ORAL_TABLET | ORAL | 11 refills | Status: DC | PRN

## 2019-07-21 MED ORDER — AJOVY 225 MG/1.5ML ~~LOC~~ SOAJ: 225.0000 mg | SUBCUTANEOUS | 11 refills | Status: DC

## 2019-07-21 MED ORDER — ONDANSETRON 4 MG PO TBDP: 4.0000 mg | ORAL_TABLET | Freq: Three times a day (TID) | ORAL | 11 refills | Status: DC | PRN

## 2019-07-21 NOTE — Progress Notes (Signed)
[facility]    Provider:  Dr Lucia GaskinsAhern Requesting Provider: Joaquim Namuncan, Graham S, MD Primary Care Provider:  Joaquim Namuncan, Graham S, MD  CC:  Migraine  HPI:  Maren ReamerCiara T Culbreath is a 23 y.o. female here as requested by Joaquim Namuncan, Graham S, MD for migraines. PMHx POTs, Migraine.  They started in elementary school, she saw Dr. Sharene SkeansHickling. They start in the left eye and travel down the left side of the face. She as been taking Topamax for years which did initially help, then on Qudexy. This year they have become a lot worse. She is also on Sumatriptan which she has to take multiple times. Headaches are pulsating, pounding, light and sound sensitivity, nausea, no aura, movement makes it worse, been to the ED due to severe headaches, affecting life. She has 27 headache days a month, 8 moderately severe migraine days a month and she is in bed 3 days a week she has severe migraine she has to go to bed, last several days. She has tried to examine her triggers. Discussed lifestyle. No other focal neurologic deficits, associated symptoms, inciting events or modifiable factors.  Reviewed notes, labs and imaging from outside physicians, which showed:  Tried: atenolol, topamax, prozac, imitrex.   CT head 2017 showed No acute intracranial abnormalities including mass lesion or mass effect, hydrocephalus, extra-axial fluid collection, midline shift, hemorrhage, or acute infarction, large ischemic events (personally reviewed images)  Review of Systems: Patient complains of symptoms per HPI as well as the following symptoms: headache. Pertinent negatives and positives per HPI. All others negative.   Social History   Socioeconomic History  . Marital status: Married    Spouse name: Lindwood QuaBranson  . Number of children: 0  . Years of education: Not on file  . 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 Smoking status: Never  Smoker  . Smokeless tobacco: Never Used  Substance and Sexual Activity  . Alcohol use: Yes    Alcohol/week: 0.0 standard drinks    Comment: Drinks on occasion   . Drug use: No  . Sexual activity: Never    Birth control/protection: Pill  Other Topics Concern  . 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 Difficulty of Paying Living Expenses: Not on file  Food Insecurity:   . Worried About Programme researcher, broadcasting/film/videounning Out of Food in the Last Year: Not on file  . Ran Out of Food in the Last Year: Not on file  Transportation Needs:   . Lack of Transportation (Medical): Not on file  . Lack of Transportation (Non-Medical): Not on file  Physical Activity:   . Days of Exercise per Week: Not on file  . Minutes of Exercise per Session: Not on file  Stress:   . Feeling of Stress : Not on file  Social Connections:   . Frequency of Communication with Friends and Family: Not on file  . Frequency of Social Gatherings with Friends and Family: Not on file  . Attends Religious Services: Not on file  . Active Member of Clubs or Organizations: Not on file  . Attends BankerClub or Organization Meetings: Not on file  . Marital Status: Not on file  Intimate Partner Violence:   .  Fear of Current or Ex-Partner: Not on file  . Emotionally Abused: Not on file  . Physically Abused: Not on file  . Sexually Abused: Not on file    Family History  Problem Relation Age of Onset  . Migraines Mother        Started 4th or 5th grade  . Seizures Mother        Febrile Seizures as a child  . Seizures Father        Febrile Seizures as a child  . Hypertension Father   . Kidney Stones Father   . Migraines Maternal Aunt   . Migraines Maternal Uncle   . Diabetes Maternal Grandmother   . Stroke Maternal Grandfather        mini-stroke  . Cancer Maternal Grandfather        Bladder cancer,  Died at 23  . Diabetes Maternal Grandfather   . Mental retardation Other        Maternal Second Cousin  . Other Other        Maternal Great Uncle had some sort of Retinal Vessel Occlusion  . Breast cancer Maternal Aunt   . Cancer Maternal Aunt        Peritoneal CA  . Eating disorder Maternal Aunt   . Depression Other        Father's side of the family  . Colon cancer Neg Hx     Past Medical History:  Diagnosis Date  . Adjustment disorder with mixed anxiety and depressed mood   . Anorexia nervosa   . Autonomic dysfunction   . Migraine headache   . POTS (postural orthostatic tachycardia syndrome)   . 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 Syncope 09/22/2014  . Vasovagal syncope     Patient Active Problem List   Diagnosis Date Noted  . Chronic migraine without aura without status migrainosus, not intractable 07/21/2019  . Routine general medical examination at a health care facility 05/11/2019  . Cough 05/11/2019  . Diarrhea 05/11/2019  . Superficial phlebitis of arm 02/07/2019  . Advance care planning 03/25/2018  . POTS (postural orthostatic tachycardia syndrome) 05/07/2017  . Adjustment disorder with mixed anxiety and depressed mood 07/13/2015  . GI symptom 05/24/2015  . Anorexia nervosa 04/29/2015  . Migraine without aura 10/30/2012    Past Surgical History:  Procedure Laterality Date  . ANTERIOR CRUCIATE LIGAMENT REPAIR  03/2010  . EYE SURGERY  2000   clogged tear duct  . WISDOM TOOTH EXTRACTION  2015    Current Outpatient Medications  Medication Sig Dispense Refill  . atenolol (TENORMIN) 25 MG tablet Take one half tablet (12.5 mg total) or 1 (25 mg total) per day 30 tablet 1  . fludrocortisone (FLORINEF) 0.1 MG tablet Take 1 tablet by mouth daily 90 tablet 3  . FLUoxetine (PROZAC) 40 MG capsule Take 1 capsule (40 mg total) by mouth daily. 90 capsule 3  . fluticasone (FLOVENT HFA) 110 MCG/ACT inhaler Inhale 1 puff into the lungs 2 (two) times daily. Rinse after use. 1  Inhaler 12  . ibuprofen (ADVIL,MOTRIN) 800 MG tablet Take 1 tablet (800 mg total) by mouth every 8 (eight) hours as needed for mild pain or moderate pain. 15 tablet 0  . JUNEL 1.5/30 1.5-30 MG-MCG tablet Take 1 tablet by mouth daily. 84 tablet 0  . pantoprazole (PROTONIX) 40 MG tablet Take 1 tablet (40 mg total) by mouth daily. 30 tablet 3  . SUMAtriptan (IMITREX) 25 MG tablet Take 1 tablet (  25 mg total) by mouth every 2 (two) hours as needed for migraine. May repeat in 2 hours if headache persists or recurs. 10 tablet 5  . triamcinolone cream (KENALOG) 0.1 % Apply 1 application topically 2 (two) times daily. 30 g 0  . Fremanezumab-vfrm (AJOVY) 225 MG/1.5ML SOAJ Inject 225 mg into the skin every 30 (thirty) days. 1 pen 11  . ondansetron (ZOFRAN-ODT) 4 MG disintegrating tablet Take 1-2 tablets (4-8 mg total) by mouth every 8 (eight) hours as needed for nausea. 60 tablet 11  . Rimegepant Sulfate (NURTEC) 75 MG TBDP Take 75 mg by mouth daily as needed. For migraines. Take as close to onset of migraine as possible. One daily maximum. 4 tablet 6  . rizatriptan (MAXALT-MLT) 10 MG disintegrating tablet Take 1 tablet (10 mg total) by mouth as needed for migraine. May repeat in 2 hours if needed 9 tablet 11  . topiramate ER (QUDEXY XR) 100 MG CS24 sprinkle capsule Take 1 capsule (100 mg total) by mouth daily. 30 capsule 1   No current facility-administered medications for this visit.    Allergies as of 07/21/2019 - Review Complete 07/21/2019  Allergen Reaction Noted  . Amoxicillin Rash 06/19/2011    Vitals: BP 113/72   Pulse 89   Temp 98.3 F (36.8 C)   Ht 5\' 6"  (1.676 m)   Wt 144 lb 9.6 oz (65.6 kg)   BMI 23.34 kg/m  Last Weight:  Wt Readings from Last 1 Encounters:  07/21/19 144 lb 9.6 oz (65.6 kg)   Last Height:   Ht Readings from Last 1 Encounters:  07/21/19 5\' 6"  (1.676 m)     Physical exam: Exam: Gen: NAD, conversant, well nourised,  well groomed                     CV: RRR, no  MRG. No Carotid Bruits. No peripheral edema, warm, nontender Eyes: Conjunctivae clear without exudates or hemorrhage  Neuro: Detailed Neurologic Exam  Speech:    Speech is normal; fluent and spontaneous with normal comprehension.  Cognition:    The patient is oriented to person, place, and time;     recent and remote memory intact;     language fluent;     normal attention, concentration,     fund of knowledge Cranial Nerves:    The pupils are equal, round, and reactive to light. The fundi are normal and spontaneous venous pulsations are present. Visual fields are full to finger confrontation. Extraocular movements are intact. Trigeminal sensation is intact and the muscles of mastication are normal. The face is symmetric. The palate elevates in the midline. Hearing intact. Voice is normal. Shoulder shrug is normal. The tongue has normal motion without fasciculations.   Coordination:    Normal finger to nose and heel to shin. Normal rapid alternating movements.   Gait:    Heel-toe and tandem gait are normal.   Motor Observation:    No asymmetry, no atrophy, and no involuntary movements noted. Tone:    Normal muscle tone.    Posture:    Posture is normal. normal erect    Strength:    Strength is V/V in the upper and lower limbs.      Sensation: intact to LT     Reflex Exam:  DTR's:    Deep tendon reflexes in the upper and lower extremities are normal bilaterally.   Toes:    The toes are downgoing bilaterally.   Clonus:    Clonus  is absent.    Assessment/Plan:  Lovely 23 year old here with migraines. Has failed 3 classes of meds (AEDs, SSRI, BBlockers) at this time will start her on Ajovy, her sister is on it and is doing very well. Patient is a Agricultural consultant. Discussed teratogenicity do not get pregnant on these meds, needs 4-5 month wash out period for Ajovy prior to pregnancy.  Preventative: Ajovy. In one month decrease Topamax to  and we can titrate off if  doing well Acute: Maxalt, Zofran and Nurtec   Meds ordered this encounter  Medications  . rizatriptan (MAXALT-MLT) 10 MG disintegrating tablet    Sig: Take 1 tablet (10 mg total) by mouth as needed for migraine. May repeat in 2 hours if needed    Dispense:  9 tablet    Refill:  11  . Rimegepant Sulfate (NURTEC) 75 MG TBDP    Sig: Take 75 mg by mouth daily as needed. For migraines. Take as close to onset of migraine as possible. One daily maximum.    Dispense:  4 tablet    Refill:  6  . ondansetron (ZOFRAN-ODT) 4 MG disintegrating tablet    Sig: Take 1-2 tablets (4-8 mg total) by mouth every 8 (eight) hours as needed for nausea.    Dispense:  60 tablet    Refill:  11  . Fremanezumab-vfrm (AJOVY) 225 MG/1.5ML SOAJ    Sig: Inject 225 mg into the skin every 30 (thirty) days.    Dispense:  1 pen    Refill:  11    Patient has copay card; she can have medication for $5 regardless of insurance approval or copay amount.  . topiramate ER (QUDEXY XR) 100 MG CS24 sprinkle capsule    Sig: Take 1 capsule (100 mg total) by mouth daily.    Dispense:  30 capsule    Refill:  1   Discussed: To prevent or relieve headaches, try the following: Cool Compress. Lie down and place a cool compress on your head.  Avoid headache triggers. If certain foods or odors seem to have triggered your migraines in the past, avoid them. A headache diary might help you identify triggers.  Include physical activity in your daily routine. Try a daily walk or other moderate aerobic exercise.  Manage stress. Find healthy ways to cope with the stressors, such as delegating tasks on your to-do list.  Practice relaxation techniques. Try deep breathing, yoga, massage and visualization.  Eat regularly. Eating regularly scheduled meals and maintaining a healthy diet might help prevent headaches. Also, drink plenty of fluids.  Follow a regular sleep schedule. Sleep deprivation might contribute to headaches Consider biofeedback.  With this mind-body technique, you learn to control certain bodily functions -- such as muscle tension, heart rate and blood pressure -- to prevent headaches or reduce headache pain.    Proceed to emergency room if you experience new or worsening symptoms or symptoms do not resolve, if you have new neurologic symptoms or if headache is severe, or for any concerning symptom.   Provided education and documentation from American headache Society toolbox including articles on: chronic migraine medication overuse headache, chronic migraines, prevention of migraines, behavioral and other nonpharmacologic treatments for headache.   Cc: Joaquim Nam, MD,  Joaquim Nam, MD  Naomie Dean, MD  [facility] [address] [place], [place] [address]  Phone [phone] Fax [phone]  A total of 60 minutes was spent face-to-face with this patient. Over half this time  was spent on counseling patient on the  1. Chronic migraine without aura without status migrainosus, not intractable    diagnosis and different diagnostic and therapeutic options, counseling and coordination of care, risks ans benefits of management, compliance, or risk factor reduction and education.

## 2019-07-21 NOTE — Patient Instructions (Signed)
Preventative: Ajovy. In one month decrease Topamax to 100mg  and we can titrate if doing well Acute: Maxalt, Zofran and Nurtec  Ondansetron oral dissolving tablet What is this medicine? ONDANSETRON (on DAN se tron) is used to treat nausea and vomiting caused by chemotherapy. It is also used to prevent or treat nausea and vomiting after surgery. This medicine may be used for other purposes; ask your health care provider or pharmacist if you have questions. COMMON BRAND NAME(S): Zofran ODT What should I tell my health care provider before I take this medicine? They need to know if you have any of these conditions:  heart disease  history of irregular heartbeat  liver disease  low levels of magnesium or potassium in the blood  an unusual or allergic reaction to ondansetron, granisetron, other medicines, foods, dyes, or preservatives  pregnant or trying to get pregnant  breast-feeding How should I use this medicine? These tablets are made to dissolve in the mouth. Do not try to push the tablet through the foil backing. With dry hands, peel away the foil backing and gently remove the tablet. Place the tablet in the mouth and allow it to dissolve, then swallow. While you may take these tablets with water, it is not necessary to do so. Talk to your pediatrician regarding the use of this medicine in children. Special care may be needed. Overdosage: If you think you have taken too much of this medicine contact a poison control center or emergency room at once. NOTE: This medicine is only for you. Do not share this medicine with others. What if I miss a dose? If you miss a dose, take it as soon as you can. If it is almost time for your next dose, take only that dose. Do not take double or extra doses. What may interact with this medicine? Do not take this medicine with any of the following medications:  apomorphine  certain medicines for fungal infections like fluconazole, itraconazole,  ketoconazole, posaconazole, voriconazole  cisapride  dronedarone  pimozide  thioridazine This medicine may also interact with the following medications:  carbamazepine  certain medicines for depression, anxiety, or psychotic disturbances  fentanyl  linezolid  MAOIs like Carbex, Eldepryl, Marplan, Nardil, and Parnate  methylene blue (injected into a vein)  other medicines that prolong the QT interval (cause an abnormal heart rhythm) like dofetilide, ziprasidone  phenytoin  rifampicin  tramadol This list may not describe all possible interactions. Give your health care provider a list of all the medicines, herbs, non-prescription drugs, or dietary supplements you use. Also tell them if you smoke, drink alcohol, or use illegal drugs. Some items may interact with your medicine. What should I watch for while using this medicine? Check with your doctor or health care professional as soon as you can if you have any sign of an allergic reaction. What side effects may I notice from receiving this medicine? Side effects that you should report to your doctor or health care professional as soon as possible:  allergic reactions like skin rash, itching or hives, swelling of the face, lips, or tongue  breathing problems  confusion  dizziness  fast or irregular heartbeat  feeling faint or lightheaded, falls  fever and chills  loss of balance or coordination  seizures  sweating  swelling of the hands and feet  tightness in the chest  tremors  unusually weak or tired Side effects that usually do not require medical attention (report to your doctor or health care professional if  they continue or are bothersome):  constipation or diarrhea  headache This list may not describe all possible side effects. Call your doctor for medical advice about side effects. You may report side effects to FDA at 1-800-FDA-1088. Where should I keep my medicine? Keep out of the reach of  children. Store between 2 and 30 degrees C (36 and 86 degrees F). Throw away any unused medicine after the expiration date. NOTE: This sheet is a summary. It may not cover all possible information. If you have questions about this medicine, talk to your doctor, pharmacist, or health care provider.  2020 Elsevier/Gold Standard (2018-07-02 07:14:10)  Rizatriptan tablets What is this medicine? RIZATRIPTAN (rye za TRIP tan) is used to treat migraines with or without aura. An aura is a strange feeling or visual disturbance that warns you of an attack. It is not used to prevent migraines. This medicine may be used for other purposes; ask your health care provider or pharmacist if you have questions. COMMON BRAND NAME(S): Maxalt What should I tell my health care provider before I take this medicine? They need to know if you have any of these conditions:  cigarette smoker  circulation problems in fingers and toes  diabetes  heart disease  high blood pressure  high cholesterol  history of irregular heartbeat  history of stroke  kidney disease  liver disease  stomach or intestine problems  an unusual or allergic reaction to rizatriptan, other medicines, foods, dyes, or preservatives  pregnant or trying to get pregnant  breast-feeding How should I use this medicine? Take this medicine by mouth with a glass of water. Follow the directions on the prescription label. Do not take it more often than directed. Talk to your pediatrician regarding the use of this medicine in children. While this drug may be prescribed for children as young as 6 years for selected conditions, precautions do apply. Overdosage: If you think you have taken too much of this medicine contact a poison control center or emergency room at once. NOTE: This medicine is only for you. Do not share this medicine with others. What if I miss a dose? This does not apply. This medicine is not for regular use. What may  interact with this medicine? Do not take this medicine with any of the following medicines:  certain medicines for migraine headache like almotriptan, eletriptan, frovatriptan, naratriptan, rizatriptan, sumatriptan, zolmitriptan  ergot alkaloids like dihydroergotamine, ergonovine, ergotamine, methylergonovine  MAOIs like Carbex, Eldepryl, Marplan, Nardil, and Parnate This medicine may also interact with the following medications:  certain medicines for depression, anxiety, or psychotic disorders  propranolol This list may not describe all possible interactions. Give your health care provider a list of all the medicines, herbs, non-prescription drugs, or dietary supplements you use. Also tell them if you smoke, drink alcohol, or use illegal drugs. Some items may interact with your medicine. What should I watch for while using this medicine? Visit your healthcare professional for regular checks on your progress. Tell your healthcare professional if your symptoms do not start to get better or if they get worse. You may get drowsy or dizzy. Do not drive, use machinery, or do anything that needs mental alertness until you know how this medicine affects you. Do not stand up or sit up quickly, especially if you are an older patient. This reduces the risk of dizzy or fainting spells. Alcohol may interfere with the effect of this medicine. Your mouth may get dry. Chewing sugarless gum or sucking hard  candy and drinking plenty of water may help. Contact your healthcare professional if the problem does not go away or is severe. If you take migraine medicines for 10 or more days a month, your migraines may get worse. Keep a diary of headache days and medicine use. Contact your healthcare professional if your migraine attacks occur more frequently. What side effects may I notice from receiving this medicine? Side effects that you should report to your doctor or health care professional as soon as  possible:  allergic reactions like skin rash, itching or hives, swelling of the face, lips, or tongue  chest pain or chest tightness  signs and symptoms of a dangerous change in heartbeat or heart rhythm like chest pain; dizziness; fast, irregular heartbeat; palpitations; feeling faint or lightheaded; falls; breathing problems  signs and symptoms of a stroke like changes in vision; confusion; trouble speaking or understanding; severe headaches; sudden numbness or weakness of the face, arm or leg; trouble walking; dizziness; loss of balance or coordination  signs and symptoms of serotonin syndrome like irritable; confusion; diarrhea; fast or irregular heartbeat; muscle twitching; stiff muscles; trouble walking; sweating; high fever; seizures; chills; vomiting Side effects that usually do not require medical attention (report to your doctor or health care professional if they continue or are bothersome):  diarrhea  dizziness  drowsiness  dry mouth  headache  nausea, vomiting  pain, tingling, numbness in the hands or feet  stomach pain This list may not describe all possible side effects. Call your doctor for medical advice about side effects. You may report side effects to FDA at 1-800-FDA-1088. Where should I keep my medicine? Keep out of the reach of children. Store at room temperature between 15 and 30 degrees C (59 and 86 degrees F). Keep container tightly closed. Throw away any unused medicine after the expiration date. NOTE: This sheet is a summary. It may not cover all possible information. If you have questions about this medicine, talk to your doctor, pharmacist, or health care provider.  2020 Elsevier/Gold Standard (2018-01-22 14:59:59)

## 2019-07-31 ENCOUNTER — Other Ambulatory Visit: Payer: Self-pay

## 2019-07-31 ENCOUNTER — Encounter: Payer: Self-pay | Admitting: Emergency Medicine

## 2019-07-31 ENCOUNTER — Ambulatory Visit: Payer: No Typology Code available for payment source

## 2019-07-31 ENCOUNTER — Ambulatory Visit (INDEPENDENT_AMBULATORY_CARE_PROVIDER_SITE_OTHER): Payer: No Typology Code available for payment source

## 2019-07-31 ENCOUNTER — Ambulatory Visit
Admission: EM | Admit: 2019-07-31 | Discharge: 2019-07-31 | Disposition: A | Discharge status: Home / Self Care | Payer: No Typology Code available for payment source | Attending: Family Medicine | Admitting: Family Medicine

## 2019-07-31 DIAGNOSIS — M25532 Pain in left wrist: Secondary | ICD-10-CM

## 2019-07-31 DIAGNOSIS — W010XXA Fall on same level from slipping, tripping and stumbling without subsequent striking against object, initial encounter: Secondary | ICD-10-CM | POA: Diagnosis not present

## 2019-07-31 DIAGNOSIS — M79602 Pain in left arm: Secondary | ICD-10-CM

## 2019-07-31 DIAGNOSIS — S52502A Unspecified fracture of the lower end of left radius, initial encounter for closed fracture: Secondary | ICD-10-CM | POA: Diagnosis not present

## 2019-07-31 MED ORDER — HYDROCODONE-ACETAMINOPHEN 5-325 MG PO TABS: 1.0000 | ORAL_TABLET | Freq: Three times a day (TID) | ORAL | 0 refills | Status: DC | PRN

## 2019-07-31 NOTE — ED Provider Notes (Signed)
[facility]    CSN: [id number] Arrival date & time: 07/31/19  1756  History   Chief Complaint Chief Complaint  Patient presents with  . Wrist Pain  . Arm Pain   HPI  24 year old female presents with the above complaint.  Patient states that she fell approximately 2 hours prior to arrival.  She was walking her dog in the woods.  She states that she tripped on a stump and fell on outstretched left hand.  She reports pain of her left wrist and distal forearm.  Mild swelling.  Patient reports that she heard a pop when she fell.  She rates her pain as 7/10 in severity.  She is taken some ibuprofen without resolution.  She is concerned that she fractured her forearm or wrist.  No relieving factors.  No other complaints.  PMH, Surgical Hx, Family Hx, Social History reviewed and updated as below.  Past Medical History:  Diagnosis Date  . Adjustment disorder with mixed anxiety and depressed mood   . Anorexia nervosa   . Autonomic dysfunction   . Migraine headache   . POTS (postural orthostatic tachycardia syndrome)   . 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 Syncope 09/22/2014  . Vasovagal syncope     Patient Active Problem List   Diagnosis Date Noted  . Chronic migraine without aura without status migrainosus, not intractable 07/21/2019  . Routine general medical examination at a health care facility 05/11/2019  . Cough 05/11/2019  . Diarrhea 05/11/2019  . Superficial phlebitis of arm 02/07/2019  . Advance care planning 03/25/2018  . POTS (postural orthostatic tachycardia syndrome) 05/07/2017  . Adjustment disorder with mixed anxiety and depressed mood 07/13/2015  . GI symptom 05/24/2015  . Anorexia nervosa 04/29/2015  . Migraine without aura 10/30/2012    Past Surgical History:  Procedure Laterality Date  . ANTERIOR CRUCIATE LIGAMENT REPAIR  03/2010  . EYE SURGERY  2000   clogged tear duct  . WISDOM TOOTH EXTRACTION  2015    OB History   No obstetric history on  file.      Home Medications    Prior to Admission medications   Medication Sig Start Date End Date Taking? Authorizing Provider  atenolol (TENORMIN) 25 MG tablet Take one half tablet (12.5 mg total) or 1 (25 mg total) per day 06/05/19  Yes Joaquim Nam, MD  fludrocortisone (FLORINEF) 0.1 MG tablet Take 1 tablet by mouth daily 12/06/18  Yes Joaquim Nam, MD  FLUoxetine (PROZAC) 40 MG capsule Take 1 capsule (40 mg total) by mouth daily. 12/06/18  Yes Joaquim Nam, MD  fluticasone (FLOVENT HFA) 110 MCG/ACT inhaler Inhale 1 puff into the lungs 2 (two) times daily. Rinse after use. 05/09/19  Yes Joaquim Nam, MD  JUNEL 1.5/30 1.5-30 MG-MCG tablet Take 1 tablet by mouth daily. 04/16/19  Yes Joaquim Nam, MD  pantoprazole (PROTONIX) 40 MG tablet Take 1 tablet (40 mg total) by mouth daily. 02/13/19  Yes Joaquim Nam, MD  Rimegepant Sulfate (NURTEC) 75 MG TBDP Take 75 mg by mouth daily as needed. For migraines. Take as close to onset of migraine as possible. One daily maximum. 07/21/19  Yes Anson Fret, MD  rizatriptan (MAXALT-MLT) 10 MG disintegrating tablet Take 1 tablet (10 mg total) by mouth as needed for migraine. May repeat in 2 hours if needed 07/21/19  Yes Anson Fret, MD  topiramate ER (QUDEXY XR) 100 MG CS24 sprinkle capsule Take 1 capsule (100  mg total) by mouth daily. 07/21/19  Yes Anson Fret, MD  HYDROcodone-acetaminophen (NORCO/VICODIN) 5-325 MG tablet Take 1 tablet by mouth every 8 (eight) hours as needed. 07/31/19   Tommie Sams, DO  ondansetron (ZOFRAN-ODT) 4 MG disintegrating tablet Take 1-2 tablets (4-8 mg total) by mouth every 8 (eight) hours as needed for nausea. 07/21/19   Anson Fret, MD  SUMAtriptan (IMITREX) 25 MG tablet Take 1 tablet (25 mg total) by mouth every 2 (two) hours as needed for migraine. May repeat in 2 hours if headache persists or recurs. 06/28/18   Deetta Perla, MD  triamcinolone cream (KENALOG) 0.1 % Apply 1  application topically 2 (two) times daily. 07/16/19   Joaquim Nam, MD    Family History Family History  Problem Relation Age of Onset  . Migraines Mother        Started 4th or 5th grade  . Seizures Mother        Febrile Seizures as a child  . Seizures Father        Febrile Seizures as a child  . Hypertension Father   . Kidney Stones Father   . Migraines Maternal Aunt   . Migraines Maternal Uncle   . Diabetes Maternal Grandmother   . Stroke Maternal Grandfather        mini-stroke  . Cancer Maternal Grandfather        Bladder cancer, Died at 39  . Diabetes Maternal Grandfather   . Mental retardation Other        Maternal Second Cousin  . Other Other        Maternal Great Uncle had some sort of Retinal Vessel Occlusion  . Breast cancer Maternal Aunt   . Cancer Maternal Aunt        Peritoneal CA  . Eating disorder Maternal Aunt   . Depression Other        Father's side of the family  . Colon cancer Neg Hx     Social History Social History   Tobacco Use  . Smoking status: Never Smoker  . Smokeless tobacco: Never Used  Substance Use Topics  . Alcohol use: Yes    Alcohol/week: 0.0 standard drinks    Comment: Drinks on occasion   . Drug use: No     Allergies   Amoxicillin   Review of Systems Review of Systems  Constitutional: Negative.   Musculoskeletal:       Wrist pain, forearm pain & swelling (left).    Physical Exam Triage Vital Signs ED Triage Vitals  Enc Vitals Group     BP 07/31/19 1814 113/75     Pulse Rate 07/31/19 1814 73     Resp 07/31/19 1814 18     Temp 07/31/19 1814 98.3 F (36.8 C)     Temp Source 07/31/19 1814 Oral     SpO2 07/31/19 1814 97 %     Weight 07/31/19 1811 140 lb (63.5 kg)     Height 07/31/19 1811 5\' 6"  (1.676 m)     Head Circumference --      Peak Flow --      Pain Score 07/31/19 1811 7     Pain Loc --      Pain Edu? --      Excl. in GC? --    No data found.  Updated Vital Signs BP 113/75 (BP Location: Right  Arm)   Pulse 73   Temp 98.3 F (36.8 C) (Oral)   Resp 18  Ht 5\' 6"  (1.676 m)   Wt 63.5 kg   LMP 07/24/2019   SpO2 97%   BMI 22.60 kg/m   Visual Acuity Right Eye Distance:   Left Eye Distance:   Bilateral Distance:    Right Eye Near:   Left Eye Near:    Bilateral Near:     Physical Exam Vitals and nursing note reviewed.  Constitutional:      General: She is not in acute distress.    Appearance: Normal appearance. She is not ill-appearing.  HENT:     Head: Normocephalic and atraumatic.  Eyes:     General:        Right eye: No discharge.        Left eye: No discharge.     Conjunctiva/sclera: Conjunctivae normal.  Cardiovascular:     Rate and Rhythm: Normal rate and regular rhythm.     Heart sounds: No murmur.  Pulmonary:     Effort: Pulmonary effort is normal.     Breath sounds: Normal breath sounds. No wheezing, rhonchi or rales.  Musculoskeletal:     Comments: Left wrist -tenderness over the distal radius.  Mild swelling.  Neurological:     Mental Status: She is alert.  Psychiatric:        Mood and Affect: Mood normal.        Behavior: Behavior normal.      UC Treatments / Results  Labs (all labs ordered are listed, but only abnormal results are displayed) Labs Reviewed - No data to display  EKG   Radiology DG Forearm Left  Result Date: 07/31/2019 CLINICAL DATA:  Pain after fall EXAM: LEFT FOREARM - 2 VIEW COMPARISON:  None. FINDINGS: No significant elbow effusion. Radial head alignment is normal. Acute nondisplaced fracture involving the distal metaphysis of the radius. IMPRESSION: Acute nondisplaced distal radius fracture Electronically Signed   By: Donavan Foil M.D.   On: 07/31/2019 18:46   DG Wrist Complete Left  Result Date: 07/31/2019 CLINICAL DATA:  Wrist pain fall EXAM: LEFT WRIST - COMPLETE 3+ VIEW COMPARISON:  None. FINDINGS: Acute nondisplaced fracture involving the distal metaphysis of the radius, unable to discern articular lucency on the  views provided. No subluxation IMPRESSION: Acute nondisplaced distal radius fracture Electronically Signed   By: Donavan Foil M.D.   On: 07/31/2019 18:47    Procedures Procedures (including critical care time)  Medications Ordered in UC Medications - No data to display  Initial Impression / Assessment and Plan / UC Course  I have reviewed the triage vital signs and the nursing notes.  Pertinent labs & imaging results that were available during my care of the patient were reviewed by me and considered in my medical decision making (see chart for details).    24 year old female presents with an acute nondisplaced distal radius fracture.  Placed in sugar tong splint.  Vicodin as needed for pain.  Advised to see orthopedics.  Information given.  Final Clinical Impressions(s) / UC Diagnoses   Final diagnoses:  Nondisplaced fracture of distal end of left radius     Discharge Instructions     Pain medication as needed.  Call [facility] tomorrow. [place] [phone]  Take care  Dr. Lacinda Axon    ED Prescriptions    Medication Sig Dispense Auth. Provider   HYDROcodone-acetaminophen (NORCO/VICODIN) 5-325 MG tablet Take 1 tablet by mouth every 8 (eight) hours as needed. 10 tablet Coral Spikes, DO     I have  reviewed the PDMP during this encounter.   Tommie Sams, [place] 07/31/19 1952

## 2019-07-31 NOTE — Discharge Instructions (Signed)
Pain medication as needed.  Call [facility] tomorrow. [address] - [phone]  Take care  Dr. Adriana Simas

## 2019-07-31 NOTE — ED Triage Notes (Signed)
Patient states she fell 2 hours ago walking her dog and fell on her left arm/wrist. She is c/o left wrist and forearm pain.

## 2019-08-01 ENCOUNTER — Ambulatory Visit (INDEPENDENT_AMBULATORY_CARE_PROVIDER_SITE_OTHER): Payer: No Typology Code available for payment source | Admitting: Gastroenterology

## 2019-08-01 ENCOUNTER — Encounter: Payer: Self-pay | Admitting: Gastroenterology

## 2019-08-01 VITALS — Ht 66.0 in | Wt 140.0 lb

## 2019-08-01 DIAGNOSIS — R197 Diarrhea, unspecified: Secondary | ICD-10-CM | POA: Diagnosis not present

## 2019-08-01 DIAGNOSIS — Z1159 Encounter for screening for other viral diseases: Secondary | ICD-10-CM | POA: Diagnosis not present

## 2019-08-01 DIAGNOSIS — R109 Unspecified abdominal pain: Secondary | ICD-10-CM

## 2019-08-01 MED ORDER — DICYCLOMINE HCL 20 MG PO TABS: 20.0000 mg | ORAL_TABLET | Freq: Three times a day (TID) | ORAL | 3 refills | Status: DC

## 2019-08-01 MED ORDER — NA SULFATE-K SULFATE-MG SULF 17.5-3.13-1.6 GM/177ML PO SOLN: 1.0000 | ORAL | 0 refills | Status: DC

## 2019-08-01 NOTE — Progress Notes (Signed)
TELEHEALTH VISIT  Referring Provider: Tonia Ghent, MD Primary Care Physician:  Tonia Ghent, MD   Tele-visit due to COVID-19 pandemic Patient requested visit virtually, consented to the virtual encounter via video enabled telemedicine application Contact made at:  14:10 08/01/19 Patient verified by name and date of birth Location of patient: Home Location provider: [facility] [facility] Names of persons participating: Me, patient, Tinnie Gens CMA Total time spent on telehealth visit and review of records:  36 minutes I discussed the limitations of evaluation and management by telemedicine. The patient expressed understanding and agreed to proceed.  Reason for Consultation:  Abdominal pain   IMPRESSION:  Lower abdominal pain resulting in syncope, relieved with diarrhea Longstanding history of alternating diarrhea and constipation POTTS syndrome Migraines  Suspected irritable bowel syndrome. At this time will check for IBS masqueraders including celiac disease, IBD, food intolerance (lactose, fructose, sucrose), as well as consider SIBO and thyroid disorders.  Although she had an EGD in 2017, her symptoms were different at that time, and no biopsies were obtained.  EGD with biopsies of the stomach and duodenum and colonoscopy with evaluation of the TI and random biopsies recommended.   PLAN: Add a daily stool bulking agent such as Metamucil Dicyclomine 20 mg QID PRN TSH, ESR, CRP, Giardia, fecal calprotectin Avoid carbonated beverages and artifical sweeteners She will keep a food diarrhea when symptoms recur  EGD and colonoscopy Consider breath test for SIBO and fructose if the evaluation above is non-diagnostic  The nature of the procedure, as well as the risks, benefits, and alternatives were carefully and thoroughly reviewed with the patient. Ample time for discussion and questions allowed. The patient understood, was satisfied, and agreed to proceed.  Please see  the "Patient Instructions" section for addition details about the plan.  HPI: Jessica [name] is a 24 y.o. female referred by Dr. Damita Dunnings for further evaluation of abdominal pain.  The history is obtained through the patient and review of her electronic health record. She is a Marine scientist in the neurosurgical ICU.  She has a history of migraines and was recently seen by neurology.  She has recurrent syncopal spells that started in 2012 and was recently seen by cardiology.  Apparently this syncopal episodes developed following sharp abdominal pain.  Nondisplaced distal radial fracture yesterday while walking her dog.   She reports a longstanding history of alternating diarrhea and constipation. Wonders if she has IBS. She also has a longstanding history of sharp, burning, non-radiating lower abdominal pain abdominal pain relieved by immediate diarrhea with associated urgency x years. Diagnosed with POTTS in high school without identified triggers. But, over the last couple of years the syncope appears to occur after she has severe abdominal pain with associated diarrhea. Occurs every 1-2 months.  She will have diarrhea without the syncopal episodes, but, the pain does not occur without diarrhea. Intermittent mucous x years. No  Blood in the stool.  Her PCP recommended Imodium but she was concerned about the resulting constipation. No associated bloating. Weight stable. Appetite is good.  No associated migraines.  She has previously attributed the symptoms to IBS.   No change with a gluten free diet.  No identified food triggers. She has not tried any other medications or dietary changes. Do not consume a significant amount of carbonated beverages. Not aware of significant artificial sweeteners in her diet.   No MJ, social wine, and no street drugs. No other associated symptoms. No identified exacerbating or relieving features.  Labs 05/06/2019 showing normal comprehensive metabolic panel including normal liver  enzymes.  She had a normal CBC including a hemoglobin of 14.3, MCV 91.8, RDW 12.2. Last TSH 2012.  She had an EGD with Dr. Paulita Fujita for periumbilical abdominal pain, dysphagia, and suspected esophageal reflux 06/13/2016.  The exam was normal and no biopsies were obtained.  Dr. Paulita Fujita suspected that her dysphagia is largely GERD mediated and that her abdominal pain was most likely dyspepsia.  Prior abdominal imaging: Upper GI series with [facility] 05/25/2016: Mild esophageal dysmotility with poor initiation of the primary stripping wave, minimal reflux, no esophageal mucosal abnormalities seen Abdominal ultrasound 08/18/2016: 9 mm echogenic area in the right lobe of the liver thought to be a probable small hemangioma MRI of the abdomen 5/14/8: Tiny subcentimeter hyperechoic lesion not seen on MRI CT of the abdomen and pelvis with contrast 12/21/2016: No abnormalities. CT of the abdomen and pelvis with contrast 06/08/2017: No abnormalities identified  Mother with diagnosis of IBS but does not pass out with the pain. No known family history of colon cancer or polyps. No family history of uterine/endometrial cancer, pancreatic cancer or gastric/stomach cancer.  Past Medical History:  Diagnosis Date  . Adjustment disorder with mixed anxiety and depressed mood   . Anorexia nervosa   . Autonomic dysfunction   . Migraine headache   . POTS (postural orthostatic tachycardia syndrome)   . 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 Syncope 09/22/2014  . Vasovagal syncope     Past Surgical History:  Procedure Laterality Date  . ANTERIOR CRUCIATE LIGAMENT REPAIR  03/2010  . EYE SURGERY  2000   clogged tear duct  . WISDOM TOOTH EXTRACTION  2015    Current Outpatient Medications  Medication Sig Dispense Refill  . atenolol (TENORMIN) 25 MG tablet Take one half tablet (12.5 mg total) or 1 (25 mg total) per day 30 tablet 1  . fludrocortisone (FLORINEF) 0.1 MG tablet Take 1 tablet by mouth daily 90 tablet 3  . FLUoxetine  (PROZAC) 40 MG capsule Take 1 capsule (40 mg total) by mouth daily. 90 capsule 3  . fluticasone (FLOVENT HFA) 110 MCG/ACT inhaler Inhale 1 puff into the lungs 2 (two) times daily. Rinse after use. 1 Inhaler 12  . HYDROcodone-acetaminophen (NORCO/VICODIN) 5-325 MG tablet Take 1 tablet by mouth every 8 (eight) hours as needed. 10 tablet 0  . JUNEL 1.5/30 1.5-30 MG-MCG tablet Take 1 tablet by mouth daily. 84 tablet 0  . ondansetron (ZOFRAN-ODT) 4 MG disintegrating tablet Take 1-2 tablets (4-8 mg total) by mouth every 8 (eight) hours as needed for nausea. 60 tablet 11  . pantoprazole (PROTONIX) 40 MG tablet Take 1 tablet (40 mg total) by mouth daily. 30 tablet 3  . Rimegepant Sulfate (NURTEC) 75 MG TBDP Take 75 mg by mouth daily as needed. For migraines. Take as close to onset of migraine as possible. One daily maximum. 4 tablet 6  . rizatriptan (MAXALT-MLT) 10 MG disintegrating tablet Take 1 tablet (10 mg total) by mouth as needed for migraine. May repeat in 2 hours if needed 9 tablet 11  . SUMAtriptan (IMITREX) 25 MG tablet Take 1 tablet (25 mg total) by mouth every 2 (two) hours as needed for migraine. May repeat in 2 hours if headache persists or recurs. 10 tablet 5  . topiramate ER (QUDEXY XR) 100 MG CS24 sprinkle capsule Take 1 capsule (100 mg total) by mouth daily. 30 capsule 1  . triamcinolone cream (KENALOG) 0.1 % Apply  1 application topically 2 (two) times daily. 30 g 0   No current facility-administered medications for this visit.    Allergies as of 08/01/2019 - Review Complete 07/31/2019  Allergen Reaction Noted  . Amoxicillin Rash 06/19/2011    Family History  Problem Relation Age of Onset  . Migraines Mother        Started 4th or 5th grade  . Seizures Mother        Febrile Seizures as a child  . Seizures Father        Febrile Seizures as a child  . Hypertension Father   . Kidney Stones Father   . Migraines Maternal Aunt   . Migraines Maternal Uncle   . Diabetes Maternal  Grandmother   . Stroke Maternal Grandfather        mini-stroke  . Cancer Maternal Grandfather        Bladder cancer, Died at 21  . Diabetes Maternal Grandfather   . Mental retardation Other        Maternal Second Cousin  . Other Other        Maternal Great Uncle had some sort of Retinal Vessel Occlusion  . Breast cancer Maternal Aunt   . Cancer Maternal Aunt        Peritoneal CA  . Eating disorder Maternal Aunt   . Depression Other        Father's side of the family  . Colon cancer Neg Hx     Social History   Socioeconomic History  . Marital status: Married    Spouse name: Marcelino Scot  . Number of children: 0  . Years of education: Not on file  . 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 Smoking status: Never Smoker  . Smokeless tobacco: Never Used  Substance and Sexual Activity  . Alcohol use: Yes    Alcohol/week: 0.0 standard drinks    Comment: Drinks on occasion   . Drug use: No  . Sexual activity: Never    Birth control/protection: Pill  Other Topics Concern  . 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 Difficulty of Paying Living Expenses: Not on file  Food Insecurity:   . Worried About Charity fundraiser in the Last Year: Not on file  . Ran Out of Food in the Last Year: Not on file  Transportation Needs:   . Lack of Transportation (Medical): Not on file  . Lack of Transportation (Non-Medical): Not on file  Physical Activity:   . Days of Exercise per Week: Not on file  . Minutes of Exercise per Session: Not on file  Stress:   . Feeling of Stress : Not on file  Social Connections:   . Frequency of Communication with Friends and Family: Not on file  . Frequency of Social Gatherings with Friends and Family: Not on file  .  Attends Religious Services: Not on file  . Active Member of Clubs or Organizations: Not on file  . Attends Archivist Meetings: Not on file  . Marital Status: Not on file  Intimate Partner Violence:   . Fear of Current or Ex-Partner: Not on file  . Emotionally Abused: Not on  file  . Physically Abused: Not on file  . Sexually Abused: Not on file    Review of Systems: ALL ROS discussed and all others negative except listed in HPI.  Physical Exam: Complete physical exam not performed due to the limits inherent in a telehealth encounter.  General: Awake, alert, and oriented, and well communicative. In no acute distress.  HEENT: EOMI, non-icteric sclera, NCAT, MMM  Neck: Normal movement of head and neck  Pulm: No labored breathing, speaking in full sentences without conversational dyspnea  Derm: No apparent lesions or bruising in visible field  MS: Black cast on her left arm. Moves all visible extremities without noticeable abnormality.  Psych: Pleasant, cooperative, normal speech, normal affect and normal insight Neuro: Alert and appropriate   Ashanta Amoroso L. Tarri Glenn, MD, MPH [facility] 08/01/2019, 10:52 AM

## 2019-08-01 NOTE — Patient Instructions (Addendum)
Please add a daily stool bulking agent such as Metamucil.  I recommended a trial of dicyclomine 20 mg used 4 times daily as needed.  Given the frequency of your symptoms you may wish to try this medication on a as needed basis as opposed to a standing dose.  I recommended some labs and stool studies to evaluate for IBS masqueraders.  I have also recommended an EGD with gastric and duodenal biopsies and a colonoscopy with evaluation of the terminal ileum.  Please let me know if you have another episode of syncope following severe abdominal pain.  Be sure to document any food that you have consumed in the 24 hours prior to symptom onset. Sometimes carbonated beverages and artificial sweeteners can cause symptoms like you have been having.  Some people also have food intolerance to fructose or sucrose.  I value your feedback and thank you for entrusting [place] with your care. If you get a [facility] patient survey, I would appreciate you taking the time to let us know about your experience today. Thank you!   Due to recent changes in healthcare laws, you may see the results of your imaging and laboratory studies on MyChart before your provider has had a chance to review them.  We understand that in some cases there may be results that are confusing or concerning to you. Not all laboratory results come back in the same time frame and the provider may be waiting for multiple results in order to interpret others.  Please give [place] 48 hours in order for your provider to thoroughly review all the results before contacting the office for clarification of your results.

## 2019-08-12 ENCOUNTER — Ambulatory Visit: Payer: No Typology Code available for payment source | Attending: Internal Medicine

## 2019-08-12 DIAGNOSIS — Z20822 Contact with and (suspected) exposure to covid-19: Secondary | ICD-10-CM

## 2019-08-13 LAB — NOVEL CORONAVIRUS, NAA: SARS-CoV-2, NAA: NOT DETECTED

## 2019-08-21 ENCOUNTER — Encounter: Payer: Self-pay | Admitting: Cardiovascular Disease

## 2019-08-21 ENCOUNTER — Telehealth (INDEPENDENT_AMBULATORY_CARE_PROVIDER_SITE_OTHER): Payer: No Typology Code available for payment source | Admitting: Cardiovascular Disease

## 2019-08-21 VITALS — Ht 66.0 in | Wt 140.0 lb

## 2019-08-21 DIAGNOSIS — G909 Disorder of the autonomic nervous system, unspecified: Secondary | ICD-10-CM | POA: Diagnosis not present

## 2019-08-21 DIAGNOSIS — R55 Syncope and collapse: Secondary | ICD-10-CM

## 2019-08-21 DIAGNOSIS — Z8669 Personal history of other diseases of the nervous system and sense organs: Secondary | ICD-10-CM

## 2019-08-21 DIAGNOSIS — K580 Irritable bowel syndrome with diarrhea: Secondary | ICD-10-CM

## 2019-08-21 NOTE — Progress Notes (Signed)
Virtual Visit via Telephone Note   This visit type was conducted due to national recommendations for restrictions regarding the COVID-19 Pandemic (e.g. social distancing) in an effort to limit this patient's exposure and mitigate transmission in our community.  Due to her co-morbid illnesses, this patient is at least at moderate risk for complications without adequate follow up.  This format is felt to be most appropriate for this patient at this time.  The patient did not have access to video technology/had technical difficulties with video requiring transitioning to audio format only (telephone).  All issues noted in this document were discussed and addressed.  No physical exam could be performed with this format.  Please refer to the patient's chart for her  consent to telehealth for [facility].   Date:  08/21/2019   ID:  Jessica [name], DOB 07-20-1996, MRN [id number]  Patient Location: Home Provider Location: Home  PCP:  Tonia Ghent, MD  Cardiologist:  Shelva Majestic, MD Electrophysiologist:  None   Evaluation Performed:  Follow-Up Visit  Chief Complaint: 7-week follow-up evaluation  History of Present Illness:    Jessica [name] is a 24 y.o. 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  She has a history of recurrent syncopal spells which commenced in 2012.  She has been previously evaluated at [facility] by pediatric cardiology and underwent initial evaluation in November 2012.  Apparently she had a normal ECG, Holter and echocardiogram.  She has not had extensive testing for noncardiac disorders that can mimic autonomic dysfunction.  She has been treated with atenolol, Florinef, and midodrine in the past.  Initially she had significant improvement on atenolol and had modest improvement with Florinef.  Remotely midodrine was discontinued due to some anorexia.  Apparently, she is felt most likely to have vasovagal syncope.  There has been some discussion of possible pots syndrome.   She has not had a tilt table test.  She was last seen by Dr. Riccardo Dubin in September 2019.  She now currently lives in [place] and would like to transition care here.  I saw her for initial cardiology evaluation on June 26, 2019.  At that time, she denied any episodes of chest pain.  Recently she was doing exceptionally well and had weaned herself off the atenolol.  However she has had 2 episodes of syncope and  started back on atenolol.  She does experience an increased heartbeat prior to syncopal spells.  Presently she is on atenolol 12.5 mg daily in addition to Florinef 0.1 mg.  She has a history of migraine headaches and takes topiramate ER 150 mg at bedtime.  She has a prescription for Imitrex as needed.  She also is on Prozac 40 mg daily.  She states that her 2 recent syncopal episode seem to occur after developing sharp stomach pains.  She has a diagnosis of irritable bowel syndrome and was planning to be evaluated by a gastroenterologist.  When I initially saw her, she stated that each syncopal spell seem to occur after initial development of sharp abdominal discomfort in her lower abdomen.  She would then experience a sense of tachycardia and blackout.  On her initial evaluation with me, her blood pressure was 102/63 supine, 106/78 sitting and after 3 minutes of standing 105/74.  She had previously been felt to have a diagnosis of autonomic insufficiency as well as possible vasovagal syncope.  She was not felt to have POTS syndrome.  She never had a tilt test.  I recommended that she undergo an echo Doppler study as well as a 30-day event monitor.  Her echo Doppler study was essentially normal and showed an EF of 60 to 65%.  There was no LVH or diastolic abnormalities.  There was trivial MR.  Her 30-day event monitor did not reveal any significant abnormality.  She was in sinus rhythm throughout the entirety with an average rate at 84.  Her slowest heart rate was sinus bradycardia at 58 on  January 3 at 1:26 AM, and her fastest heart rate was sinus tachycardia on New Year's Day at 12:39 PM.  The patient states that she works night shift 3 days/week and when she does work those shifts she does sleep in the daytime.  Most of her episodes of sinus tachycardia were when she was up but she did have some episodes while sleeping.  She does not recall any dreaming or night terrors.  Since I last saw her, she has now developed Covid infection and is at home in quarantine.  Her husband developed symptoms 1 week prior to she.  She never developed a fever.  She does have a cough and has not lost taste or smell.  She denies any significant dyspnea.  She was evaluated by Dr. Tarri Glenn of GI.  Laboratory testing was recommended that has been for due to her Covid.  Once stable she will also undergo colonoscopy and endoscopy.  Presently she feels well.  She denies any further syncopal spells.  Her blood pressure has remained on the low side.  She continues to take atenolol 12.5 mg daily 0.1 mg of Florinef.  She presents for telemedicine evaluation.   Past Medical History:  Diagnosis Date  . Adjustment disorder with mixed anxiety and depressed mood   . Anorexia nervosa   . Autonomic dysfunction   . Migraine headache   . POTS (postural orthostatic tachycardia syndrome)   . 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 Syncope 09/22/2014  . Vasovagal syncope    Past Surgical History:  Procedure Laterality Date  . ANTERIOR CRUCIATE LIGAMENT REPAIR  03/2010  . EYE SURGERY  2000   clogged tear duct  . WISDOM TOOTH EXTRACTION  2015     Current Meds  Medication Sig  . atenolol (TENORMIN) 25 MG tablet Take one half tablet (12.5 mg total) or 1 (25 mg total) per day  . dicyclomine (BENTYL) 20 MG tablet Take 1 tablet (20 mg total) by mouth 4 (four) times daily -  before meals and at bedtime.  . fludrocortisone (FLORINEF) 0.1 MG tablet Take 1 tablet by mouth daily  . FLUoxetine (PROZAC) 40 MG capsule Take 1 capsule  (40 mg total) by mouth daily.  . fluticasone (FLOVENT HFA) 110 MCG/ACT inhaler Inhale 1 puff into the lungs 2 (two) times daily. Rinse after use.  Lenda Kelp 1.5/30 1.5-30 MG-MCG tablet Take 1 tablet by mouth daily.  . Na Sulfate-K Sulfate-Mg Sulf 17.5-3.13-1.6 GM/177ML SOLN Take 1 kit by mouth as directed.  . ondansetron (ZOFRAN-ODT) 4 MG disintegrating tablet Take 1-2 tablets (4-8 mg total) by mouth every 8 (eight) hours as needed for nausea.  . pantoprazole (PROTONIX) 40 MG tablet Take 1 tablet (40 mg total) by mouth daily.  . Rimegepant Sulfate (NURTEC) 75 MG TBDP Take 75 mg by mouth daily as needed. For migraines. Take as close to onset of migraine as possible. One daily maximum.  . rizatriptan (MAXALT-MLT) 10 MG disintegrating tablet Take 1 tablet (10 mg total) by  mouth as needed for migraine. May repeat in 2 hours if needed  . topiramate ER (QUDEXY XR) 100 MG CS24 sprinkle capsule Take 1 capsule (100 mg total) by mouth daily.  Marland Kitchen triamcinolone cream (KENALOG) 0.1 % Apply 1 application topically 2 (two) times daily.     Allergies:   Amoxicillin   Social History   Tobacco Use  . Smoking status: Never Smoker  . Smokeless tobacco: Never Used  Substance Use Topics  . Alcohol use: Yes    Alcohol/week: 0.0 standard drinks    Comment: , wine  . Drug use: No     Family Hx: The patient's family history includes Breast cancer in her maternal aunt; Cancer in her maternal aunt and maternal grandfather; Depression in an other family member; Diabetes in her maternal grandfather and maternal grandmother; Eating disorder in her maternal aunt; Hypertension in her father; Kidney Stones in her father; Mental retardation in an other family member; Migraines in her maternal aunt, maternal uncle, and mother; Other in an other family member; Seizures in her father and mother; Stroke in her maternal grandfather. There is no history of Colon cancer.  ROS:   Please see the history of present illness.      Positive for present Covid infection No fevers chills or night sweats No wheezing or shortness of breath Episodes of her heart beating fast. No recent presyncope or syncope Intermittent abdominal discomfort, currently undergoing GI evaluation History of irritable bowel syndrome  All other systems reviewed and are negative.   Prior CV studies:   The following studies were reviewed today:  The patient wore a 30-day event monitor from July 02, 2019 until July 31, 2019.  The predominant rhythm was sinus rhythm with an average heart rate at 84 bpm.  The slowest heart rate was sinus bradycardia at 58 bpm which occurred at 1:26 AM on July 27, 2019.  The fastest heartbeat was sinus tachycardia at 156 bpm which occurred on New Year's Day at 12:39 PM.  All rhythms were stable.  There was sinus rhythm with mild sinus arrhythmia.  There were no episodes of atrial fibrillation.  There was no ventricular ectopy.  There were no pauses.   ECHO 07/03/2019 IMPRESSIONS  1. Left ventricular ejection fraction, by visual estimation, is 60 to 65%. The left ventricle has normal function. There is no left ventricular hypertrophy.  2. The left ventricle has no regional wall motion abnormalities.  3. Global right ventricle has normal systolic function.The right ventricular size is normal. No increase in right ventricular wall thickness.  4. Left atrial size was normal.  5. Right atrial size was normal.  6. The mitral valve is normal in structure. Trivial mitral valve regurgitation.  7. The tricuspid valve is normal in structure. Tricuspid valve regurgitation is not demonstrated.  8. The aortic valve is normal in structure. Aortic valve regurgitation is not visualized. No evidence of aortic valve sclerosis or stenosis.  9. The pulmonic valve was not well visualized. Pulmonic valve regurgitation is not visualized. 10. TR signal is inadequate for assessing pulmonary artery systolic pressure. 11. The  inferior vena cava is normal in size with greater than 50% respiratory variability, suggesting right atrial pressure of 3 mmHg.  Labs/Other Tests and Data Reviewed:    EKG:  An ECG dated June 26, 2019 was personally reviewed today and demonstrated:  Normal sinus rhythm at 83 bpm.  No ST segment changes.  Normal intervals.  PR 158 ms/QTc interval 455 ms.  No ectopy  Recent Labs: 05/06/2019: ALT 27; BUN 13; Creatinine, Ser 0.83; Hemoglobin 14.3; Platelets 290.0; Potassium 3.9; Sodium 140   Recent Lipid Panel Lab Results  Component Value Date/Time   CHOL 187 (H) 04/29/2015 02:42 PM   TRIG 116 04/29/2015 02:42 PM   HDL 38 04/29/2015 02:42 PM   CHOLHDL 4.9 04/29/2015 02:42 PM   LDLCALC 126 (H) 04/29/2015 02:42 PM    Wt Readings from Last 3 Encounters:  08/21/19 140 lb (63.5 kg)  08/01/19 140 lb (63.5 kg)  07/31/19 140 lb (63.5 kg)     Objective:    Vital Signs:  Ht '5\' 6"'$  (1.676 m)   Wt 140 lb (63.5 kg)   LMP 07/24/2019   BMI 22.60 kg/m    Patient states that she did take her blood pressure for this visit with blood pressure 90/66, pulse 78  Since this was a telemedicine visit I could not physically examine the patient. Breathing was normal and not labored She denied any chest wall tenderness Heart rhythm was stable and regular with a rate at 78 There was no abdominal pain presently There was no edema No lightheadedness    ASSESSMENT & PLAN:    1. Syncope, unspecified syncope type   2. Autonomic dysfunction   3. Irritable bowel syndrome with diarrhea   4. History of migraine headaches     Jessica [name] is a 24 year old female who has had recurrent episodes of syncope dating back to 2012.  In the past she has been felt to have possible autonomic insufficiency as well as possible vasovagal syncope and has not been felt to have POTS syndrome.  She has never had a tilt table test.  Her symptoms seemingly occur after development of abdominal discomfort.  I reviewed her echo  Doppler study.  Structurally her heart is normal.  She has normal systolic and diastolic function.  There is just trivial mitral regurgitation.  Her 30-day event monitor was reviewed with her in detail today.  No significant arrhythmia was demonstrated.  She was in sinus rhythm through its entirety with the average rate at 84 bpm, the slowest rate at 58 bpm.  She has had several episodes of sinus tachycardia with rates in the 140s to 150s with maximal rate at 156 bpm.  She does work night shift.  These episodes of tachycardia have occurred during the day or early evening.  The episode on New Year's Day may have occurred while she was sleeping.  She states that she is sleeping well and is unaware of any sleep disordered breathing.  She is refreshed upon awakening.  She is unaware of any vivid dreams or night terrors.  No ventricular ectopy was noted.  There were no episodes of atrial fibrillation or any pauses.  If she continues to experience recurrent symptomatology with syncopal spells, tilt table testing may be worthwhile to pursue.  She is now in quarantine with Covid and was retested and told to be positive after her husband had developed symptoms with fever loss of taste and smell 1 week ago.  She has not had any fevers but does admit to just mild sore throat.  I cannot check orthostatics today and this telemedicine visit.  I have recommended she continue her very low-dose atenolol 12.5 mg and Florinef.  She will be undergoing laboratory which has been deferred.  She never had labs that I had initially ordered from her evaluation and when she is stable from a Covid perspective these will be done.  I will  see her on an as-needed basis or sooner if problems arise.  COVID-19 Education: The signs and symptoms of COVID-19 were discussed with the patient and how to seek care for testing (follow up with PCP or arrange E-visit).  The importance of social distancing was discussed today.  Time:   Today, I have  spent 20 minutes with the patient with telehealth technology discussing the above problems.     Medication Adjustments/Labs and Tests Ordered: Current medicines are reviewed at length with the patient today.  Concerns regarding medicines are outlined above.   Tests Ordered: No orders of the defined types were placed in this encounter.   Medication Changes: No orders of the defined types were placed in this encounter.   Follow Up:  In Person prn  Signed, Shelva Majestic, MD  08/21/2019 11:53 AM    [place]

## 2019-08-21 NOTE — Patient Instructions (Signed)
Medication Instructions:  NO CHANGES, CONTINUE WITH CURRENT MEDICATIONS *If you need a refill on your cardiac medications before your next appointment, please call your pharmacy*  Follow-Up: AS NEEDED WITH DR. Tresa Endo

## 2019-08-25 ENCOUNTER — Encounter: Payer: Self-pay | Admitting: Family Medicine

## 2019-08-25 NOTE — Telephone Encounter (Signed)
I spoke with pt; she has soreness in both cheeks and behind both eyes and across the entire forehead. Pt has slight fatigue and had body aches on 08/24/19 but none today. No other covid symptoms; pt said she tested positive for covid on 08/15/19. Pt scheduled virtual visit on 08/26/19 at 12:30 with Dr Para March. UC & ED precautions given and pt voiced understanding. FYI to Dr Para March.

## 2019-08-25 NOTE — Telephone Encounter (Signed)
Please triage patient.  See my chart message.  Thanks. 

## 2019-08-26 ENCOUNTER — Other Ambulatory Visit: Payer: Self-pay | Admitting: Family Medicine

## 2019-08-26 ENCOUNTER — Encounter: Payer: Self-pay | Admitting: Family Medicine

## 2019-08-26 ENCOUNTER — Other Ambulatory Visit: Payer: Self-pay

## 2019-08-26 ENCOUNTER — Ambulatory Visit (INDEPENDENT_AMBULATORY_CARE_PROVIDER_SITE_OTHER): Payer: No Typology Code available for payment source | Admitting: Family Medicine

## 2019-08-26 DIAGNOSIS — J019 Acute sinusitis, unspecified: Secondary | ICD-10-CM | POA: Diagnosis not present

## 2019-08-26 MED ORDER — DOXYCYCLINE HYCLATE 100 MG PO TABS: 100.0000 mg | ORAL_TABLET | Freq: Two times a day (BID) | ORAL | 0 refills | Status: DC

## 2019-08-26 NOTE — Telephone Encounter (Signed)
Electronic refill request. [facility] Last office visit:   CPE 05/09/2019 Last Filled:    84 tablet 0 04/16/2019  Please advise.

## 2019-08-26 NOTE — Progress Notes (Signed)
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   Limitations and rationale for visit method d/w patient.  Patient agreed to proceed.   CC: follow up.    HPI: covid positive 07/2018. she has been out of work, today is day #10.  She had a fever last night and wasn't cleared to return to work. She had ST and mild cough initially.  Then she had fever last night. Now with facial pain.  She is still on flovent in the meantime.  B maxillary and frontal pain. Some ear pain.  No sputum.  Some cough, mild.    Tachycardia noted with fatigue and dec in exercise tolerance.    She has 1 more week in the hard cast for her L arm.  She isn't having arm pain.    Meds and allergies reviewed.  ROS: Per HPI unless specifically indicated in ROS section   NAD Speech wnl  A/P: Presumed sinusitis.  Discussed options.  Hold out of work for now.  Is reasonable to return to work with 24h w/o fever and feeling better.  LMP last week.  Start doxycycline.  Update me as needed.  Rest and fluid otherwise.  Supportive care.  She agrees.  She will update me as needed.

## 2019-08-26 NOTE — Telephone Encounter (Signed)
Noted. Thanks.

## 2019-08-27 ENCOUNTER — Ambulatory Visit: Payer: No Typology Code available for payment source | Admitting: Cardiovascular Disease

## 2019-08-27 DIAGNOSIS — J019 Acute sinusitis, unspecified: Secondary | ICD-10-CM | POA: Insufficient documentation

## 2019-08-27 NOTE — Assessment & Plan Note (Signed)
Presumed sinusitis.  Discussed options.  Hold out of work for now.  Is reasonable to return to work with 24h w/o fever and feeling better.  LMP last week.  Start doxycycline.  Update me as needed.  Rest and fluid otherwise.  Supportive care.  She agrees.  She will update me as needed.

## 2019-08-27 NOTE — Telephone Encounter (Signed)
Sent. Thanks.   

## 2019-08-29 ENCOUNTER — Encounter: Payer: No Typology Code available for payment source | Admitting: Gastroenterology

## 2019-09-01 ENCOUNTER — Ambulatory Visit: Payer: No Typology Code available for payment source | Admitting: Gastroenterology

## 2019-09-01 DIAGNOSIS — S52501A Unspecified fracture of the lower end of right radius, initial encounter for closed fracture: Secondary | ICD-10-CM | POA: Diagnosis not present

## 2019-09-10 ENCOUNTER — Other Ambulatory Visit: Payer: Self-pay | Admitting: Family Medicine

## 2019-09-10 ENCOUNTER — Other Ambulatory Visit: Payer: Self-pay | Admitting: Pediatrics

## 2019-09-10 DIAGNOSIS — F4323 Adjustment disorder with mixed anxiety and depressed mood: Secondary | ICD-10-CM

## 2019-09-10 DIAGNOSIS — F5000 Anorexia nervosa, unspecified: Secondary | ICD-10-CM

## 2019-09-23 ENCOUNTER — Other Ambulatory Visit: Payer: Self-pay | Admitting: Family Medicine

## 2019-10-13 ENCOUNTER — Encounter: Payer: Self-pay | Admitting: Family Medicine

## 2019-10-15 NOTE — Telephone Encounter (Signed)
Unable to reach pt by either contact #. 

## 2019-10-15 NOTE — Telephone Encounter (Signed)
Unable to reach pt by phone and left v/m for pt to call [facility] for virtual appt and UC & ED precautions given.

## 2019-10-15 NOTE — Telephone Encounter (Signed)
Please triage patient.  I sent her my chart message.  Thanks.

## 2019-10-15 NOTE — Telephone Encounter (Signed)
Unable to reach pt by phone.

## 2019-10-16 NOTE — Telephone Encounter (Signed)
Unable to reach pt by phone; left v/m requesting cb to [phone].

## 2019-10-16 NOTE — Telephone Encounter (Signed)
Patient returned Rena's call.  Patient can be reached at [phone].

## 2019-10-17 ENCOUNTER — Other Ambulatory Visit: Payer: Self-pay | Admitting: Family Medicine

## 2019-10-17 DIAGNOSIS — I951 Orthostatic hypotension: Secondary | ICD-10-CM

## 2019-10-17 NOTE — Telephone Encounter (Signed)
H/o POTS Will send in #90 x1 while pcp out of office then future refills from him.

## 2019-10-17 NOTE — Telephone Encounter (Signed)
Pt is out of Fludrocortisone 0.1 mg and pt does not get [facility] since started working for [employer] and request refill to [employer]. Pt had virtual acute visit on 08/26/19;05/09/2019 annual exam. Pt will ck with [facility] later today.Please advise.

## 2019-10-17 NOTE — Telephone Encounter (Signed)
Patient advised.

## 2019-10-17 NOTE — Telephone Encounter (Signed)
Pt said has spot on lt side of throat that is sore but is not a S/T. Feels like something is stuck in her throat. On and off pain radiates to her ear and the pain has not worsened but is consistent at pain level of 4. Pt has no covid symptoms, no travel and no known exposure to + covid. Pt does not want to go to UC unless necessary and scheduled in office appt with Dr Para March on 10/20/19 at 9 AM. If condition worsens or changes prior to appt pt will go to UC. FYI to Dr Para March.

## 2019-10-20 ENCOUNTER — Ambulatory Visit (INDEPENDENT_AMBULATORY_CARE_PROVIDER_SITE_OTHER): Payer: No Typology Code available for payment source | Admitting: Family Medicine

## 2019-10-20 ENCOUNTER — Encounter: Payer: Self-pay | Admitting: Family Medicine

## 2019-10-20 ENCOUNTER — Other Ambulatory Visit: Payer: Self-pay

## 2019-10-20 VITALS — BP 92/68 | HR 85 | Temp 96.9°F | Ht 66.0 in | Wt 140.1 lb

## 2019-10-20 DIAGNOSIS — J029 Acute pharyngitis, unspecified: Secondary | ICD-10-CM | POA: Diagnosis not present

## 2019-10-20 MED ORDER — LIDOCAINE VISCOUS HCL 2 % MT SOLN: 5.0000 mL | Freq: Four times a day (QID) | OROMUCOSAL | 0 refills | Status: DC | PRN

## 2019-10-20 MED ORDER — ATENOLOL 25 MG PO TABS: ORAL_TABLET | ORAL | Status: DC

## 2019-10-20 NOTE — Progress Notes (Signed)
This visit occurred during the SARS-CoV-2 public health emergency.  Safety protocols were in place, including screening questions prior to the visit, additional usage of staff PPE, and extensive cleaning of exam room while observing appropriate contact time as indicated for disinfecting solutions.  Her L arm is still sore but better.  She is improving.  She feel better with less headaches with 12.5mg  atenolol.    Rash resolved with TAC.  No ADE on med. Used daily, comes back with cessation.  Small amount used, prn.  She feels like something is irritated on L tonsil.  Feels likely something is stuck there.  Some occ L ear pain.  Some L throat/neck pain.  She has h/o tonsil stones but hasn't seen one recently.  She has been gargling w/o relief.  No R sided pain.  No FCNAVD.  Nonsmoker.  She is on PPI at baseline with relief of GERD.    Meds, vitals, and allergies reviewed.   ROS: Per HPI unless specifically indicated in ROS section   nad ncat R TM wnl L TM very minimally pinkish tint w/o SOM.   OP wnl No tonsil stone or irritation seen.   No neck LA rrr ctab

## 2019-10-20 NOTE — Patient Instructions (Signed)
We'll call about ENT eval.   Use lidocaine if needed.  Take care.  Glad to see you.

## 2019-10-22 NOTE — Assessment & Plan Note (Addendum)
I do not see an obvious cause and she does not have any lymphadenopathy.  I do not see a tonsil stone.  She does not have typical symptoms for mono.  I would like ENT input.  She may need direct visualization via scope and I need ENT input.  Discussed with patient.  She agrees.  Referral ordered.  I do not see any ulceration or alarming symptoms.  She can use lidocaine orally as needed.  Routine cautions given.

## 2019-10-28 ENCOUNTER — Other Ambulatory Visit: Payer: Self-pay | Admitting: Neurology

## 2019-11-12 ENCOUNTER — Encounter (INDEPENDENT_AMBULATORY_CARE_PROVIDER_SITE_OTHER): Payer: Self-pay | Admitting: Otolaryngology

## 2019-11-12 ENCOUNTER — Ambulatory Visit (INDEPENDENT_AMBULATORY_CARE_PROVIDER_SITE_OTHER): Payer: No Typology Code available for payment source | Admitting: Otolaryngology

## 2019-11-12 ENCOUNTER — Other Ambulatory Visit: Payer: Self-pay

## 2019-11-12 VITALS — Temp 97.3°F

## 2019-11-12 DIAGNOSIS — J3501 Chronic tonsillitis: Secondary | ICD-10-CM

## 2019-11-12 NOTE — Progress Notes (Signed)
HPI: Jessica [name] is a 24 y.o. female who presents is referred by her PCP for evaluation of chronic left-sided sore throat.  This has been on and off for the past month.  She apparently has had history of tonsil stones and has taken them out previously.  She has had similar pain associated with this time but was unable to remove any tonsil stones.  She has been gargling with saline.  Symptoms are on the left side and sometimes radiate to the left ear. She has had symptoms now for about 5 or 6 weeks. Patient is allergic to amoxicillin..  Past Medical History:  Diagnosis Date  . Adjustment disorder with mixed anxiety and depressed mood   . Anorexia nervosa   . Autonomic dysfunction   . Migraine headache   . POTS (postural orthostatic tachycardia syndrome)   . 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 Syncope 09/22/2014  . Vasovagal syncope    Past Surgical History:  Procedure Laterality Date  . ANTERIOR CRUCIATE LIGAMENT REPAIR  03/2010  . EYE SURGERY  2000   clogged tear duct  . WISDOM TOOTH EXTRACTION  2015   Social History   Socioeconomic History  . Marital status: Married    Spouse name: Lindwood Qua  . Number of children: 0  . Years of education: Not on file  . 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 Smoking status: Never Smoker  . Smokeless tobacco: Never Used  Substance and Sexual Activity  . Alcohol use: Yes    Alcohol/week: 0.0 standard drinks    Comment: , wine  . Drug use: No  . Sexual activity: Never    Birth control/protection: Pill  Other Topics Concern  . 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 Difficulty of Paying Living Expenses:   Food Insecurity:   . Worried About Programme researcher, broadcasting/film/video in the Last Year:   . Barista in  the Last Year:   Transportation Needs:   . Freight forwarder (Medical):   Marland Kitchen Lack of Transportation (Non-Medical):   Physical Activity:   . Days of Exercise per Week:   . Minutes of Exercise per Session:   Stress:   . Feeling of Stress :   Social Connections:   . Frequency of Communication with Friends and Family:   . Frequency of Social Gatherings with Friends and Family:   . Attends Religious Services:   . Active Member of Clubs or Organizations:   . Attends Banker Meetings:   Marland Kitchen Marital Status:    Family History  Problem Relation Age of Onset  . Migraines Mother        Started 4th or 5th grade  . Seizures Mother        Febrile Seizures as a child  . Seizures Father        Febrile Seizures as a child  . Hypertension Father   . Kidney Stones Father   . Migraines Maternal Aunt   . Migraines Maternal Uncle   . Diabetes Maternal Grandmother   . Stroke Maternal Grandfather        mini-stroke  . Cancer Maternal Grandfather  Bladder cancer, Died at 5  . Diabetes Maternal Grandfather   . Mental retardation Other        Maternal Second Cousin  . Other Other        Maternal Great Uncle had some sort of Retinal Vessel Occlusion  . Breast cancer Maternal Aunt   . Cancer Maternal Aunt        Peritoneal CA  . Eating disorder Maternal Aunt   . Depression Other        Father's side of the family  . Colon cancer Neg Hx    Allergies  Allergen Reactions  . Amoxicillin Rash   Prior to Admission medications   Medication Sig Start Date End Date Taking? Authorizing Provider  atenolol (TENORMIN) 25 MG tablet TAKE ONE-HALF TABLET (12.5 MG TOTAL) PER DAY 10/20/19  Yes Joaquim Nam, MD  dicyclomine (BENTYL) 20 MG tablet Take 1 tablet (20 mg total) by mouth 4 (four) times daily -  before meals and at bedtime. 08/01/19  Yes Tressia Danas, MD  fludrocortisone (FLORINEF) 0.1 MG tablet TAKE 1 TABLET BY MOUTH EVERY DAY 10/17/19  Yes Eustaquio Boyden, MD   FLUoxetine (PROZAC) 40 MG capsule TAKE 1 CAPSULE BY MOUTH EVERY DAY 09/10/19  Yes Joaquim Nam, MD  fluticasone (FLOVENT HFA) 110 MCG/ACT inhaler Inhale 1 puff into the lungs 2 (two) times daily. Rinse after use. 05/09/19  Yes Joaquim Nam, MD  Fremanezumab-vfrm (AJOVY) 225 MG/1.5ML SOAJ Inject into the skin every 30 (thirty) days.   Yes [provider]  JUNEL 1.5/30 1.5-30 MG-MCG tablet TAKE 1 TABLET BY MOUTH DAILY. TAKE ONE WEEK OFF FROM ACTIVE MEDS EVERY 90 DAYS. 08/27/19  Yes Joaquim Nam, MD  lidocaine (XYLOCAINE) 2 % solution Use as directed 5 mLs in the mouth or throat every 6 (six) hours as needed for mouth pain (gargle for throat pain as needed.). 10/20/19  Yes Joaquim Nam, MD  ondansetron (ZOFRAN-ODT) 4 MG disintegrating tablet Take 1-2 tablets (4-8 mg total) by mouth every 8 (eight) hours as needed for nausea. 07/21/19  Yes Anson Fret, MD  pantoprazole (PROTONIX) 40 MG tablet Take 1 tablet (40 mg total) by mouth daily. 02/13/19  Yes Joaquim Nam, MD  QUDEXY XR 100 MG CS24 sprinkle capsule TAKE 1 CAPSULE (100 MG TOTAL) BY MOUTH DAILY. 10/29/19  Yes Anson Fret, MD  Rimegepant Sulfate (NURTEC) 75 MG TBDP Take 75 mg by mouth daily as needed. For migraines. Take as close to onset of migraine as possible. One daily maximum. 07/21/19  Yes Anson Fret, MD  rizatriptan (MAXALT-MLT) 10 MG disintegrating tablet Take 1 tablet (10 mg total) by mouth as needed for migraine. May repeat in 2 hours if needed 07/21/19  Yes Anson Fret, MD  triamcinolone cream (KENALOG) 0.1 % Apply 1 application topically 2 (two) times daily. 07/16/19  Yes Joaquim Nam, MD     Positive ROS: Otherwise negative  All other systems have been reviewed and were otherwise negative with the exception of those mentioned in the HPI and as above.  Physical Exam: Constitutional: Alert, well-appearing, no acute distress Ears: External ears without lesions or tenderness. Ear canals  are clear bilaterally with intact, clear TMs.  Left TM is clear. Nasal: External nose without lesions. Septum midline.. Clear nasal passages with no signs of infection. Oral: Lips and gums without lesions. Tongue and palate mucosa without lesions. Posterior oropharynx clear.  She has average size symmetric appearing tonsils bilaterally.  No  acute exudate noted.  I cannot identify definite tonsil stone within the left tonsil crypts.  However the pain and discomfort seems to be centered in this area according to the patient when I probed this area with a Q-tip.  Manual palpation of the left tonsil reveals no palpable masses. Indirect laryngoscopy revealed a clear base of tongue vallecula and epiglottis. Neck: No palpable adenopathy or masses.  No palpable adenopathy noted on the left side and no tenderness to palpation of the left neck. Respiratory: Breathing comfortably  Skin: No facial/neck lesions or rash noted.  Procedures  Assessment: Chronic left-sided sore throat probably related to left tonsil but normal clinical evaluation.  Plan: Recommend gargling on a regular basis as she is presently doing and may be using antiseptic such as Listerine or scope.  Recommended use of NSAIDs such as ibuprofen for 5 days. If symptoms do not improve I prescribed a course of Z-Pak. If she continues to have chronic or recurrent problems could consider tonsillectomy. She will call us if she continues to have problems.   Radene Journey, MD   CC:

## 2019-12-24 ENCOUNTER — Other Ambulatory Visit: Payer: Self-pay | Admitting: Family Medicine

## 2019-12-24 DIAGNOSIS — F4323 Adjustment disorder with mixed anxiety and depressed mood: Secondary | ICD-10-CM

## 2019-12-24 DIAGNOSIS — F5000 Anorexia nervosa, unspecified: Secondary | ICD-10-CM

## 2019-12-25 ENCOUNTER — Other Ambulatory Visit: Payer: Self-pay | Admitting: Family Medicine

## 2020-01-07 ENCOUNTER — Other Ambulatory Visit: Payer: Self-pay | Admitting: Neurology

## 2020-01-19 ENCOUNTER — Encounter: Payer: Self-pay | Admitting: Neurology

## 2020-01-19 ENCOUNTER — Ambulatory Visit (INDEPENDENT_AMBULATORY_CARE_PROVIDER_SITE_OTHER): Payer: No Typology Code available for payment source | Admitting: Neurology

## 2020-01-19 ENCOUNTER — Other Ambulatory Visit: Payer: Self-pay | Admitting: Neurology

## 2020-01-19 VITALS — BP 114/80 | HR 78 | Ht 66.0 in | Wt 141.0 lb

## 2020-01-19 DIAGNOSIS — G43709 Chronic migraine without aura, not intractable, without status migrainosus: Secondary | ICD-10-CM

## 2020-01-19 MED ORDER — TOPIRAMATE ER 50 MG PO SPRINKLE CAP24: 50.0000 mg | EXTENDED_RELEASE_CAPSULE | Freq: Every evening | ORAL | 6 refills | Status: DC

## 2020-01-19 MED ORDER — AJOVY 225 MG/1.5ML ~~LOC~~ SOAJ: 225.0000 mg | SUBCUTANEOUS | 4 refills | Status: DC

## 2020-01-19 MED ORDER — ATENOLOL 25 MG PO TABS: ORAL_TABLET | ORAL | 3 refills | Status: DC

## 2020-01-19 MED ORDER — ONDANSETRON 4 MG PO TBDP: 4.0000 mg | ORAL_TABLET | Freq: Three times a day (TID) | ORAL | 11 refills | Status: DC | PRN

## 2020-01-19 MED ORDER — RIZATRIPTAN BENZOATE 10 MG PO TBDP: 10.0000 mg | ORAL_TABLET | ORAL | 11 refills | Status: DC | PRN

## 2020-01-19 NOTE — Progress Notes (Signed)
[facility]    Provider:  Dr Lucia Gaskins Requesting Provider: Joaquim Nam, MD Primary Care Provider:  Joaquim Nam, MD  CC:  Migraine  01/19/2020: She is doing great on the Ajovy. Works Firefighter. Only taken the maxalt twice since last seen.  Patient only had to use her Maxalt a few times since last being seen, and is worked well, we did discuss again Ajovy and risks in pregnancy do not get pregnant, we also discussed that she is on Topamax and that we can slowly further decrease it, we discussed decreasing it, I do not think she will have any problems if she goes down to 50 mg and hold there for several weeks to a month and if she wants to stop it at that time she can.  Also discussed that with migraines get worse go back to her last previous dose.  There can be risk for rebound seizures but I doubt it at this dosage, we did discuss that.  HPI:  Jessica [name] is a 24 y.o. female here as requested by Joaquim Nam, MD for migraines. PMHx POTs, Migraine.  They started in elementary school, she saw Dr. Sharene Skeans. They start in the left eye and travel down the left side of the face. She as been taking Topamax for years which did initially help, then on Qudexy. This year they have become a lot worse. She is also on Sumatriptan which she has to take multiple times. Headaches are pulsating, pounding, light and sound sensitivity, nausea, no aura, movement makes it worse, been to the ED due to severe headaches, affecting life. She has 27 headache days a month, 8 moderately severe migraine days a month and she is in bed 3 days a week she has severe migraine she has to go to bed, last several days. She has tried to examine her triggers. Discussed lifestyle. No other focal neurologic deficits, associated symptoms, inciting events or modifiable factors.  Reviewed notes, labs and imaging from outside physicians, which showed:  Tried: atenolol, topamax, prozac, imitrex.   CT head 2017  showed No acute intracranial abnormalities including mass lesion or mass effect, hydrocephalus, extra-axial fluid collection, midline shift, hemorrhage, or acute infarction, large ischemic events (personally reviewed images)  Review of Systems: .Patient complains of symptoms per HPI as well as the following symptoms: improved headaches otherwise negative . Pertinent negatives and positives per HPI. All others negative    Social History   Socioeconomic History  . Marital status: Married    Spouse name: Lindwood Qua  . Number of children: 0  . Years of education: Not on file  . 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 Smoking status: Never Smoker  . Smokeless tobacco: Never Used  Vaping Use  . Vaping Use: Never used  Substance and Sexual Activity  . Alcohol use: Yes    Alcohol/week: 0.0 standard drinks    Comment: , wine 1-2 times a month, socially   . Drug use: No  . Sexual activity: Never    Birth control/protection: Pill  Other Topics Concern  . 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 Difficulty of Paying Living Expenses:   Food Insecurity:   .  Worried About Programme researcher, broadcasting/film/video in the Last Year:   . Barista in the Last Year:   Transportation Needs:   . Freight forwarder (Medical):   Marland Kitchen Lack of Transportation (Non-Medical):   Physical Activity:   . Days of Exercise per Week:   . Minutes of Exercise per Session:   Stress:   . Feeling of Stress :   Social Connections:   . Frequency of Communication with Friends and Family:   . Frequency of Social Gatherings with Friends and Family:   . Attends Religious Services:   . Active Member of Clubs or Organizations:   . Attends Banker Meetings:   Marland Kitchen Marital Status:     Intimate Partner Violence:   . Fear of Current or Ex-Partner:   . Emotionally Abused:   Marland Kitchen Physically Abused:   . Sexually Abused:     Family History  Problem Relation Age of Onset  . Migraines Mother        Started 4th or 5th grade  . Seizures Mother        Febrile Seizures as a child  . Seizures Father        Febrile Seizures as a child  . Hypertension Father   . Kidney Stones Father   . Migraines Maternal Aunt   . Migraines Maternal Uncle   . Diabetes Maternal Grandmother   . Stroke Maternal Grandfather        mini-stroke  . Cancer Maternal Grandfather        Bladder cancer, Died at 12  . Diabetes Maternal Grandfather   . Mental retardation Other        Maternal Second Cousin  . Other Other        Maternal Great Uncle had some sort of Retinal Vessel Occlusion  . Breast cancer Maternal Aunt   . Cancer Maternal Aunt        Peritoneal CA  . Eating disorder Maternal Aunt   . Depression Other        Father's side of the family  . Colon cancer Neg Hx     Past Medical History:  Diagnosis Date  . Adjustment disorder with mixed anxiety and depressed mood   . Anorexia nervosa   . Autonomic dysfunction   . Migraine headache   . POTS (postural orthostatic tachycardia syndrome)   . 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 Syncope 09/22/2014  . Vasovagal syncope     Patient Active Problem List   Diagnosis Date Noted  . Chronic migraine without aura without status migrainosus, not intractable 07/21/2019  . Routine general medical examination at a health care facility 05/11/2019  . Cough 05/11/2019  . Diarrhea 05/11/2019  . Superficial phlebitis of arm 02/07/2019  . Advance care planning 03/25/2018  . POTS (postural orthostatic tachycardia syndrome) 05/07/2017  . Sore throat 09/25/2016  . Adjustment disorder with mixed anxiety and depressed mood 07/13/2015  . GI symptom 05/24/2015  . Anorexia nervosa 04/29/2015  . Migraine without aura 10/30/2012    Past Surgical History:   Procedure Laterality Date  . ANTERIOR CRUCIATE LIGAMENT REPAIR  03/2010  . EYE SURGERY  2000   clogged tear duct  . WISDOM TOOTH EXTRACTION  2015    Current Outpatient Medications  Medication Sig Dispense Refill  . atenolol (TENORMIN) 25 MG tablet TAKE ONE-HALF TABLET (12.5 MG TOTAL) PER DAY 45 tablet 3  . fludrocortisone (FLORINEF) 0.1 MG tablet TAKE 1 TABLET BY MOUTH  EVERY DAY 90 tablet 0  . FLUoxetine (PROZAC) 40 MG capsule TAKE 1 CAPSULE BY MOUTH EVERY DAY 30 capsule 2  . JUNEL 1.5/30 1.5-30 MG-MCG tablet TAKE 1 TABLET BY MOUTH DAILY. TAKE ONE WEEK OFF FROM ACTIVE MEDS EVERY 90 DAYS. 84 tablet 3  . ondansetron (ZOFRAN-ODT) 4 MG disintegrating tablet Take 1-2 tablets (4-8 mg total) by mouth every 8 (eight) hours as needed for nausea. 30 tablet 11  . pantoprazole (PROTONIX) 40 MG tablet Take 1 tablet (40 mg total) by mouth daily. 30 tablet 3  . rizatriptan (MAXALT-MLT) 10 MG disintegrating tablet Take 1 tablet (10 mg total) by mouth as needed for migraine. May repeat in 2 hours if needed 9 tablet 11  . topiramate ER (QUDEXY XR) 50 MG CS24 sprinkle capsule Take 1 capsule (50 mg total) by mouth at bedtime. 30 capsule 6  . triamcinolone cream (KENALOG) 0.1 % Apply 1 application topically 2 (two) times daily. 30 g 0  . Fremanezumab-vfrm (AJOVY) 225 MG/1.5ML SOAJ Inject 225 mg into the skin every 30 (thirty) days. 3 pen 4   No current facility-administered medications for this visit.    Allergies as of 01/19/2020 - Review Complete 01/19/2020  Allergen Reaction Noted  . Amoxicillin Rash 06/19/2011    Vitals: BP 114/80 (BP Location: Right Arm, Patient Position: Sitting)   Pulse 78   Ht 5\' 6"  (1.676 m)   Wt 141 lb (64 kg)   BMI 22.76 kg/m  Last Weight:  Wt Readings from Last 1 Encounters:  01/19/20 141 lb (64 kg)   Last Height:   Ht Readings from Last 1 Encounters:  01/19/20 5\' 6"  (1.676 m)    Physical exam: Exam: Gen: NAD, conversant, well nourised, obese, well groomed                      CV: RRR, no MRG. No Carotid Bruits. No peripheral edema, warm, nontender Eyes: Conjunctivae clear without exudates or hemorrhage  Neuro: Detailed Neurologic Exam  Speech:    Speech is normal; fluent and spontaneous with normal comprehension.  Cognition:    The patient is oriented to person, place, and time;     recent and remote memory intact;     language fluent;     normal attention, concentration,     fund of knowledge Cranial Nerves:    The pupils are equal, round, and reactive to light.  Visual fields are full to finger confrontation. Extraocular movements are intact. Trigeminal sensation is intact and the muscles of mastication are normal. The face is symmetric. The palate elevates in the midline. Hearing intact. Voice is normal. Shoulder shrug is normal. The tongue has normal motion without fasciculations.   Motor Observation:    No asymmetry, no atrophy, and no involuntary movements noted. Tone:    Normal muscle tone.    Posture:    Posture is normal. normal erect    Strength:    Strength is V/V in the upper and lower limbs.      A/P: chronic migraines doing great on Ajovy  - decrease Qudexy to 50mg  then in 2-4 weeks can stop - maxalt acutely   Meds ordered this encounter  Medications  . topiramate ER (QUDEXY XR) 50 MG CS24 sprinkle capsule    Sig: Take 1 capsule (50 mg total) by mouth at bedtime.    Dispense:  30 capsule    Refill:  6  . Fremanezumab-vfrm (AJOVY) 225 MG/1.5ML SOAJ    Sig: Inject  225 mg into the skin every 30 (thirty) days.    Dispense:  3 pen    Refill:  4  . ondansetron (ZOFRAN-ODT) 4 MG disintegrating tablet    Sig: Take 1-2 tablets (4-8 mg total) by mouth every 8 (eight) hours as needed for nausea.    Dispense:  30 tablet    Refill:  11  . rizatriptan (MAXALT-MLT) 10 MG disintegrating tablet    Sig: Take 1 tablet (10 mg total) by mouth as needed for migraine. May repeat in 2 hours if needed    Dispense:  9 tablet    Refill:   11  . atenolol (TENORMIN) 25 MG tablet    Sig: TAKE ONE-HALF TABLET (12.5 MG TOTAL) PER DAY    Dispense:  45 tablet    Refill:  3   Discussed: To prevent or relieve headaches, try the following: Cool Compress. Lie down and place a cool compress on your head.  Avoid headache triggers. If certain foods or odors seem to have triggered your migraines in the past, avoid them. A headache diary might help you identify triggers.  Include physical activity in your daily routine. Try a daily walk or other moderate aerobic exercise.  Manage stress. Find healthy ways to cope with the stressors, such as delegating tasks on your to-do list.  Practice relaxation techniques. Try deep breathing, yoga, massage and visualization.  Eat regularly. Eating regularly scheduled meals and maintaining a healthy diet might help prevent headaches. Also, drink plenty of fluids.  Follow a regular sleep schedule. Sleep deprivation might contribute to headaches Consider biofeedback. With this mind-body technique, you learn to control certain bodily functions -- such as muscle tension, heart rate and blood pressure -- to prevent headaches or reduce headache pain.    Proceed to emergency room if you experience new or worsening symptoms or symptoms do not resolve, if you have new neurologic symptoms or if headache is severe, or for any concerning symptom.   Provided education and documentation from American headache Society toolbox including articles on: chronic migraine medication overuse headache, chronic migraines, prevention of migraines, behavioral and other nonpharmacologic treatments for headache.   Cc: Joaquim Nam, MD,  Joaquim Nam, MD  Naomie Dean, MD  [facility] [address] [place], [place] [address]  Phone [phone] Fax [phone]  A total of 15 minutes was spent face-to-face with this patient. Over half this time was spent on counseling patient on the   1. Chronic migraine without aura without status migrainosus, not intractable    diagnosis and different diagnostic and therapeutic options, counseling and coordination of care, risks ans benefits of management, compliance, or risk factor reduction and education.

## 2020-01-22 ENCOUNTER — Other Ambulatory Visit: Payer: Self-pay | Admitting: Family Medicine

## 2020-01-22 DIAGNOSIS — I951 Orthostatic hypotension: Secondary | ICD-10-CM

## 2020-01-23 NOTE — Telephone Encounter (Signed)
Electronic refill request. Florinef Last office visit:   10/20/2019 Acute Last Filled:     90 tablet 0 10/17/2019  Please advise.

## 2020-01-26 ENCOUNTER — Other Ambulatory Visit: Payer: Self-pay | Admitting: Family Medicine

## 2020-01-26 NOTE — Telephone Encounter (Signed)
Sent. Thanks.   

## 2020-01-29 ENCOUNTER — Telehealth: Payer: Self-pay | Admitting: *Deleted

## 2020-01-29 NOTE — Telephone Encounter (Signed)
Completed Ajovy PA on Cover My Meds. Key: [id number]. Awaiting determination from [employer].

## 2020-02-24 ENCOUNTER — Other Ambulatory Visit (HOSPITAL_COMMUNITY): Payer: Self-pay | Admitting: Obstetrics and Gynecology

## 2020-02-26 LAB — HM PAP SMEAR

## 2020-03-08 ENCOUNTER — Encounter: Payer: Self-pay | Admitting: Family Medicine

## 2020-03-25 ENCOUNTER — Ambulatory Visit
Admission: EM | Admit: 2020-03-25 | Discharge: 2020-03-25 | Disposition: A | Discharge status: Home / Self Care | Payer: No Typology Code available for payment source | Attending: Emergency Medicine | Admitting: Emergency Medicine

## 2020-03-25 ENCOUNTER — Other Ambulatory Visit: Payer: Self-pay

## 2020-03-25 ENCOUNTER — Telehealth: Payer: Self-pay

## 2020-03-25 DIAGNOSIS — R079 Chest pain, unspecified: Secondary | ICD-10-CM | POA: Diagnosis not present

## 2020-03-25 NOTE — Telephone Encounter (Signed)
Pt had first pfizer covid vaccine 24 hrs ago; pt worked last night at [employer] and had body aches, then about 4 AM started with mid dull CP that lasted until 7 AM and went away. Mid dull CP returned at 8 AM this morning and pt still has CP. Health at work advised pt needs to seek medical treatment. Dr Para March advised should be evaluated and pt will go to [facility] on [facility] or [place].

## 2020-03-25 NOTE — ED Triage Notes (Signed)
Pt presents for CP started at 0430 while working on third shift.  Initially thought it was heartburn.  Took Tums and laid down but it did not improve.   Reports pain being a pressure in midchest, sometimes toward back.  Does not radiate, no dizziness/nausea.  No SOB.   Received first dose of Pfizer vaccine yesterday 0730.

## 2020-03-25 NOTE — ED Provider Notes (Signed)
Jessica [name]    CSN: [id number] Arrival date & time: 03/25/20  1516      History   Chief Complaint Chief Complaint  Patient presents with  . Chest Pain    HPI Jessica [name] is a 24 y.o. female.   Patient presents with chest pain since 430 this morning.  The pain started while she was at work.  She attempted treatment with Tums and Tylenol without relief.  She states the pain has improved today.  She denies shortness of breath, nausea, vomiting, diaphoresis, focal weakness, or other symptoms.  Patient had her first dose Pfizer vaccine yesterday.  The history is provided by the patient.    Past Medical History:  Diagnosis Date  . Adjustment disorder with mixed anxiety and depressed mood   . Anorexia nervosa   . Autonomic dysfunction   . Migraine headache   . POTS (postural orthostatic tachycardia syndrome)   . 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 Syncope 09/22/2014  . Vasovagal syncope     Patient Active Problem List   Diagnosis Date Noted  . Chronic migraine without aura without status migrainosus, not intractable 07/21/2019  . Routine general medical examination at a health care facility 05/11/2019  . Cough 05/11/2019  . Diarrhea 05/11/2019  . Superficial phlebitis of arm 02/07/2019  . Advance care planning 03/25/2018  . POTS (postural orthostatic tachycardia syndrome) 05/07/2017  . Sore throat 09/25/2016  . Adjustment disorder with mixed anxiety and depressed mood 07/13/2015  . GI symptom 05/24/2015  . Anorexia nervosa 04/29/2015  . Migraine without aura 10/30/2012    Past Surgical History:  Procedure Laterality Date  . ANTERIOR CRUCIATE LIGAMENT REPAIR  03/2010  . EYE SURGERY  2000   clogged tear duct  . WISDOM TOOTH EXTRACTION  2015    OB History   No obstetric history on file.      Home Medications    Prior to Admission medications   Medication Sig Start Date End Date Taking? Authorizing Provider  atenolol (TENORMIN) 25 MG tablet TAKE  ONE-HALF TABLET (12.5 MG TOTAL) PER DAY 01/19/20  Yes Anson Fret, MD  fludrocortisone (FLORINEF) 0.1 MG tablet TAKE 1 TABLET BY MOUTH EVERY DAY 01/26/20  Yes Joaquim Nam, MD  FLUoxetine (PROZAC) 40 MG capsule TAKE 1 CAPSULE BY MOUTH EVERY DAY 12/25/19  Yes Joaquim Nam, MD  Fremanezumab-vfrm (AJOVY) 225 MG/1.5ML SOAJ Inject 225 mg into the skin every 30 (thirty) days. 01/19/20  Yes Anson Fret, MD  JUNEL 1.5/30 1.5-30 MG-MCG tablet TAKE 1 TABLET BY MOUTH DAILY. TAKE ONE WEEK OFF FROM ACTIVE MEDS EVERY 90 DAYS. 08/27/19  Yes Joaquim Nam, MD  ondansetron (ZOFRAN-ODT) 4 MG disintegrating tablet Take 1-2 tablets (4-8 mg total) by mouth every 8 (eight) hours as needed for nausea. 01/19/20  Yes Anson Fret, MD  pantoprazole (PROTONIX) 40 MG tablet Take 1 tablet (40 mg total) by mouth daily. 02/13/19  Yes Joaquim Nam, MD  rizatriptan (MAXALT-MLT) 10 MG disintegrating tablet Take 1 tablet (10 mg total) by mouth as needed for migraine. May repeat in 2 hours if needed 01/19/20  Yes Anson Fret, MD  topiramate ER (QUDEXY XR) 50 MG CS24 sprinkle capsule Take 1 capsule (50 mg total) by mouth at bedtime. 01/19/20  Yes Anson Fret, MD  triamcinolone cream (KENALOG) 0.1 % Apply 1 application topically 2 (two) times daily. 07/16/19   Joaquim Nam, MD    Family History Family  History  Problem Relation Age of Onset  . Migraines Mother        Started 4th or 5th grade  . Seizures Mother        Febrile Seizures as a child  . Seizures Father        Febrile Seizures as a child  . Hypertension Father   . Kidney Stones Father   . Migraines Maternal Aunt   . Migraines Maternal Uncle   . Diabetes Maternal Grandmother   . Stroke Maternal Grandfather        mini-stroke  . Cancer Maternal Grandfather        Bladder cancer, Died at 11  . Diabetes Maternal Grandfather   . Mental retardation Other        Maternal Second Cousin  . Other Other        Maternal Great Uncle had some  sort of Retinal Vessel Occlusion  . Breast cancer Maternal Aunt   . Cancer Maternal Aunt        Peritoneal CA  . Eating disorder Maternal Aunt   . Depression Other        Father's side of the family  . Colon cancer Neg Hx     Social History Social History   Tobacco Use  . Smoking status: Never Smoker  . Smokeless tobacco: Never Used  Vaping Use  . Vaping Use: Never used  Substance Use Topics  . Alcohol use: Yes    Alcohol/week: 0.0 standard drinks    Comment: , wine 1-2 times a month, socially   . Drug use: No     Allergies   Amoxicillin   Review of Systems Review of Systems  Constitutional: Negative for chills, diaphoresis and fever.  HENT: Negative for ear pain and sore throat.   Eyes: Negative for pain and visual disturbance.  Respiratory: Negative for cough and shortness of breath.   Cardiovascular: Positive for chest pain. Negative for palpitations.  Gastrointestinal: Negative for abdominal pain, nausea and vomiting.  Genitourinary: Negative for dysuria and hematuria.  Musculoskeletal: Negative for arthralgias and back pain.  Skin: Negative for color change and rash.  Neurological: Negative for dizziness, seizures, syncope, facial asymmetry, speech difficulty, weakness and numbness.  All other systems reviewed and are negative.    Physical Exam Triage Vital Signs ED Triage Vitals  Enc Vitals Group     BP      Pulse      Resp      Temp      Temp src      SpO2      Weight      Height      Head Circumference      Peak Flow      Pain Score      Pain Loc      Pain Edu?      Excl. in GC?    No data found.  Updated Vital Signs BP 120/82 (BP Location: Left Arm)   Pulse 92   Temp 99.2 F (37.3 C) (Oral)   Resp 18   LMP 03/02/2020 (Exact Date)   SpO2 98%   Visual Acuity Right Eye Distance:   Left Eye Distance:   Bilateral Distance:    Right Eye Near:   Left Eye Near:    Bilateral Near:     Physical Exam Vitals and nursing note reviewed.   Constitutional:      General: She is not in acute distress.    Appearance: She is  well-developed. She is not ill-appearing.  HENT:     Head: Normocephalic and atraumatic.     Mouth/Throat:     Mouth: Mucous membranes are moist.  Eyes:     Conjunctiva/sclera: Conjunctivae normal.  Cardiovascular:     Rate and Rhythm: Normal rate and regular rhythm.     Heart sounds: Normal heart sounds. No murmur heard.   Pulmonary:     Effort: Pulmonary effort is normal. No respiratory distress.     Breath sounds: Normal breath sounds. No wheezing or rhonchi.  Abdominal:     Palpations: Abdomen is soft.     Tenderness: There is no abdominal tenderness. There is no guarding or rebound.  Musculoskeletal:     Cervical back: Neck supple.  Skin:    General: Skin is warm and dry.     Findings: No rash.  Neurological:     General: No focal deficit present.     Mental Status: She is alert and oriented to person, place, and time.     Gait: Gait normal.  Psychiatric:        Mood and Affect: Mood normal.        Behavior: Behavior normal.      UC Treatments / Results  Labs (all labs ordered are listed, but only abnormal results are displayed) Labs Reviewed - No data to display  EKG   Radiology No results found.  Procedures Procedures (including critical care time)  Medications Ordered in UC Medications - No data to display  Initial Impression / Assessment and Plan / UC Course  I have reviewed the triage vital signs and the nursing notes.  Pertinent labs & imaging results that were available during my care of the patient were reviewed by me and considered in my medical decision making (see chart for details).   Chest pain.  Discussed limitations of urgent care with patient at length.  She declines transfer to the ED.  EKG shows sinus rhythm, rate 79, no ST elevation, compared to previous from December 2020.  Instructed patient to take Tylenol or ibuprofen as needed for discomfort.   Discussed at length that she should go to the ED if she has acute chest pain or other concerning symptoms.  Instructed her to follow-up with her PCP as needed.  Patient agrees to plan of care.   Final Clinical Impressions(s) / UC Diagnoses   Final diagnoses:  Chest pain, unspecified type     Discharge Instructions     Go to the emergency department if you have acute chest pain or other concerning symptoms.    Follow-up with your primary care provider as needed.        ED Prescriptions    None     PDMP not reviewed this encounter.   Mickie Bail, NP 03/25/20 [phone]

## 2020-03-25 NOTE — Discharge Instructions (Signed)
Go to the emergency department if you have acute chest pain or other concerning symptoms.    Follow-up with your primary care provider as needed.

## 2020-03-26 NOTE — Telephone Encounter (Signed)
Agree. Thanks

## 2020-04-23 ENCOUNTER — Other Ambulatory Visit: Payer: Self-pay | Admitting: Family Medicine

## 2020-04-23 DIAGNOSIS — G43009 Migraine without aura, not intractable, without status migrainosus: Secondary | ICD-10-CM

## 2020-04-26 ENCOUNTER — Other Ambulatory Visit: Payer: Self-pay | Admitting: Neurology

## 2020-04-27 NOTE — Telephone Encounter (Signed)
Ajovy denied by [employer] due to requirement that pt try Aimovig and Emgality. I submitted an appeal in cover my meds requesting reconsideration.   Key: [id number]

## 2020-04-28 ENCOUNTER — Telehealth: Payer: Self-pay

## 2020-04-28 NOTE — Telephone Encounter (Signed)
PA request submitted to [employer] on CMM, Key: [id number].  [employer] is processing your PA request and will respond shortly with next steps.

## 2020-04-28 NOTE — Telephone Encounter (Signed)
I have submitted follow up questions to [employer].   KeyArlana Pouch - PA Case ID: [id number]  Awaiting determination.

## 2020-05-06 ENCOUNTER — Other Ambulatory Visit: Payer: Self-pay

## 2020-05-06 ENCOUNTER — Other Ambulatory Visit (INDEPENDENT_AMBULATORY_CARE_PROVIDER_SITE_OTHER): Payer: No Typology Code available for payment source

## 2020-05-06 DIAGNOSIS — G43009 Migraine without aura, not intractable, without status migrainosus: Secondary | ICD-10-CM

## 2020-05-06 LAB — BASIC METABOLIC PANEL
BUN: 13 mg/dL (ref 6–23)
CO2: 26 mEq/L (ref 19–32)
Calcium: 9.5 mg/dL (ref 8.4–10.5)
Chloride: 105 mEq/L (ref 96–112)
Creatinine, Ser: 0.84 mg/dL (ref 0.40–1.20)
GFR: 96.66 mL/min (ref >60.00)
Glucose, Bld: 86 mg/dL (ref 70–99)
Potassium: 4 mEq/L (ref 3.5–5.1)
Sodium: 140 mEq/L (ref 135–145)

## 2020-05-06 LAB — TSH: TSH: 1.77 u[IU]/mL (ref 0.35–4.50)

## 2020-05-06 LAB — CBC WITH DIFFERENTIAL/PLATELET
Basophils Absolute: 0.2 10*3/uL — ABNORMAL HIGH (ref 0.0–0.1)
Basophils Relative: 1.9 % (ref 0.0–3.0)
Eosinophils Absolute: 0.1 10*3/uL (ref 0.0–0.7)
Eosinophils Relative: 1.5 % (ref 0.0–5.0)
HCT: 41.9 % (ref 36.0–46.0)
Hemoglobin: 13.8 g/dL (ref 12.0–15.0)
Lymphocytes Relative: 43.3 % (ref 12.0–46.0)
Lymphs Abs: 3.7 10*3/uL (ref 0.7–4.0)
MCHC: 32.9 g/dL (ref 30.0–36.0)
MCV: 91.9 fl (ref 78.0–100.0)
Monocytes Absolute: 0.6 10*3/uL (ref 0.1–1.0)
Monocytes Relative: 6.8 % (ref 3.0–12.0)
Neutro Abs: 4 10*3/uL (ref 1.4–7.7)
Neutrophils Relative %: 46.5 % (ref 43.0–77.0)
Platelets: 309 10*3/uL (ref 150.0–400.0)
RBC: 4.56 Mil/uL (ref 3.87–5.11)
RDW: 12.6 % (ref 11.5–15.5)
WBC: 8.5 10*3/uL (ref 4.0–10.5)

## 2020-05-11 ENCOUNTER — Encounter: Payer: Self-pay | Admitting: Family Medicine

## 2020-05-11 ENCOUNTER — Other Ambulatory Visit: Payer: Self-pay

## 2020-05-11 ENCOUNTER — Ambulatory Visit (INDEPENDENT_AMBULATORY_CARE_PROVIDER_SITE_OTHER): Payer: No Typology Code available for payment source | Admitting: Family Medicine

## 2020-05-11 ENCOUNTER — Other Ambulatory Visit: Payer: Self-pay | Admitting: Family Medicine

## 2020-05-11 VITALS — BP 104/64 | HR 96 | Temp 97.4°F | Ht 66.0 in | Wt 141.1 lb

## 2020-05-11 DIAGNOSIS — Z0000 Encounter for general adult medical examination without abnormal findings: Secondary | ICD-10-CM | POA: Diagnosis not present

## 2020-05-11 DIAGNOSIS — G90A Postural orthostatic tachycardia syndrome (POTS): Secondary | ICD-10-CM

## 2020-05-11 DIAGNOSIS — F5000 Anorexia nervosa, unspecified: Secondary | ICD-10-CM

## 2020-05-11 DIAGNOSIS — G43709 Chronic migraine without aura, not intractable, without status migrainosus: Secondary | ICD-10-CM

## 2020-05-11 DIAGNOSIS — F4323 Adjustment disorder with mixed anxiety and depressed mood: Secondary | ICD-10-CM

## 2020-05-11 DIAGNOSIS — I951 Orthostatic hypotension: Secondary | ICD-10-CM

## 2020-05-11 DIAGNOSIS — Z7189 Other specified counseling: Secondary | ICD-10-CM

## 2020-05-11 MED ORDER — FLUOXETINE HCL 40 MG PO CAPS: ORAL_CAPSULE | ORAL | 3 refills | Status: DC

## 2020-05-11 MED ORDER — FLUDROCORTISONE ACETATE 0.1 MG PO TABS: 50.0000 ug | ORAL_TABLET | Freq: Every day | ORAL | Status: DC

## 2020-05-11 MED ORDER — PANTOPRAZOLE SODIUM 40 MG PO TBEC: 40.0000 mg | DELAYED_RELEASE_TABLET | Freq: Every day | ORAL | 1 refills | Status: DC

## 2020-05-11 NOTE — Patient Instructions (Addendum)
Call GI about follow up.  Let me know if you need a referral.  Take care.  Glad to see you. Thanks for your effort.

## 2020-05-11 NOTE — Progress Notes (Signed)
This visit occurred during the SARS-CoV-2 public health emergency.  Safety protocols were in place, including screening questions prior to the visit, additional usage of staff PPE, and extensive cleaning of exam room while observing appropriate contact time as indicated for disinfecting solutions.  CPE- See plan.  Routine anticipatory guidance given to patient.  See health maintenance.  The possibility exists that previously documented standard health maintenance information may have been brought forward from a previous encounter into this note.  If needed, that same information has been updated to reflect the current situation based on today's encounter.    Tetanus 2016 Flu done at work.  PNA and shingles not due.  covid vaccine 2021 Mammogram/DXA not due  Pap smear per gyn 2021 Advanced directive discussed with patient. She would have her husband designated if she were incapacitated. Gyn visit prev done.    Prev GI eval was deferred with covid infection.  D/w pt.  She didn't notice an improvement with bentyl.  She will call about GI follow-up.  POTS.  No recent syncope.  Was able to wean to 1/2 tab of florinef.  Compliant with meds.  Compliant with beta-blocker.  Migraines per neuro with clear improvement with ajovy.    Discussed work situation.  Mood is good.  No adverse effect on medications.  PMH and SH reviewed  Meds, vitals, and allergies reviewed.   ROS: Per HPI.  Unless specifically indicated otherwise in HPI, the patient denies:  General: fever. Eyes: acute vision changes ENT: sore throat Cardiovascular: chest pain Respiratory: SOB GI: vomiting GU: dysuria Musculoskeletal: acute back pain Derm: acute rash Neuro: acute motor dysfunction Psych: worsening mood Endocrine: polydipsia Heme: bleeding Allergy: hayfever  GEN: nad, alert and oriented HEENT: ncat NECK: supple w/o LA CV: rrr. PULM: ctab, no inc wob ABD: soft, +bs EXT: no edema SKIN: Well-perfused.

## 2020-05-12 NOTE — Assessment & Plan Note (Signed)
Tetanus 2016 Flu done at work.  PNA and shingles not due.  covid vaccine 2021 Mammogram/DXA not due  Pap smear per gyn 2021 Advanced directive discussed with patient. She would have her husband designated if she were incapacitated. Gyn visit prev done.    Prev GI eval was deferred with covid infection.  D/w pt.  She didn't notice an improvement with bentyl.  She will call about GI follow-up.

## 2020-05-12 NOTE — Assessment & Plan Note (Signed)
Advanced directive discussed with patient.  She would have her husband designated if she were incapacitated. 

## 2020-05-12 NOTE — Assessment & Plan Note (Signed)
Per neurology with improvement with ajovy.

## 2020-05-12 NOTE — Assessment & Plan Note (Signed)
No recent syncope.  Was able to wean to 1/2 tab of florinef.  Compliant with meds.  Compliant with beta-blocker.  Labs discussed with patient.  Would continue as is.  She will update me as needed.

## 2020-05-27 NOTE — Telephone Encounter (Signed)
This request has not been approved. Based on the information submitted for review, you did not meet our guideline rules for the requested drug. In order for your request to be approved, your provider would need to show that you have met the guideline rules below. The details below are written in medical language. If you have questions, please contact your provider. In some cases, the requested medication or alternatives offered may have additional approval requirements. For the preventative treatment of chronic migraine (you experience 15 or more migraine days per month), our guideline named [facility] (the generic name for Ajovy) requires that you have previously tried [employer] AND Emgality, unless there is a valid medical reason you cannot. Please note that Aimovig and Emgality also require a prior authorization. This request has been denied because you do not meet the requirement listed above based upon the information provided. Please work with your doctor to use a different medication or get [place] more information if it will allow [place] to approve this request. A written notification letter will follow with additional details. This request has not been approved. A written notification letter will follow with additional details.

## 2020-07-21 NOTE — Telephone Encounter (Signed)
I had to resubmit the PA request for Ajovy on CMM, Key: [facility] - PA Case ID: [id number].

## 2020-07-26 NOTE — Telephone Encounter (Signed)
DENIED.  Pt must first try and fail Aimovig AND Emgality.

## 2020-07-28 ENCOUNTER — Other Ambulatory Visit: Payer: Self-pay | Admitting: Gastroenterology

## 2020-07-28 DIAGNOSIS — R197 Diarrhea, unspecified: Secondary | ICD-10-CM

## 2020-07-28 DIAGNOSIS — R109 Unspecified abdominal pain: Secondary | ICD-10-CM

## 2020-08-05 ENCOUNTER — Encounter: Payer: Self-pay | Admitting: Family Medicine

## 2020-08-06 ENCOUNTER — Other Ambulatory Visit: Payer: Self-pay | Admitting: Family Medicine

## 2020-08-06 MED ORDER — PREDNISONE 10 MG PO TABS: ORAL_TABLET | ORAL | 0 refills | Status: DC

## 2020-08-29 ENCOUNTER — Encounter: Payer: Self-pay | Admitting: Family Medicine

## 2020-08-30 ENCOUNTER — Other Ambulatory Visit: Payer: Self-pay | Admitting: Neurology

## 2020-08-30 ENCOUNTER — Other Ambulatory Visit: Payer: Self-pay | Admitting: Family Medicine

## 2020-08-30 MED ORDER — DOXYCYCLINE HYCLATE 100 MG PO TABS: 100.0000 mg | ORAL_TABLET | Freq: Two times a day (BID) | ORAL | 0 refills | Status: DC

## 2020-08-30 NOTE — Telephone Encounter (Signed)
Spoke to patient by telephone and was advised that she started feeling bad Tuesday. 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 Patient stated that she has been off from work since. Patient stated that she was feeling a little better and then woke up this morning feeling a lot worse. Patient stated today she has a low grade fever, nasal congestion, pressure in her face and a dry cough. Patient stated last week she started with a sore throat. Patient stated that she has been taking OTC medications. Patient stated that she did a home rapid test Wednesday and yesterday with both being negative. Patient is scheduled to go back to work Thursday night. After speaking with Dr. Para March patient was advised to call health at work and see about getting a PCR test done by them. Patient scheduled for a virtual visit with Dr. Para March tomorrow 08/31/20 at 4:00 pm. Patient stated that she will call back if she has any problems getting a covid test set up thru [facility]. Pharmacy [facility]

## 2020-08-31 ENCOUNTER — Encounter: Payer: Self-pay | Admitting: Family Medicine

## 2020-08-31 ENCOUNTER — Other Ambulatory Visit: Payer: Self-pay

## 2020-08-31 ENCOUNTER — Telehealth (INDEPENDENT_AMBULATORY_CARE_PROVIDER_SITE_OTHER): Payer: No Typology Code available for payment source | Admitting: Family Medicine

## 2020-08-31 DIAGNOSIS — J069 Acute upper respiratory infection, unspecified: Secondary | ICD-10-CM

## 2020-08-31 NOTE — Progress Notes (Signed)
Virtual visit completed through WebEx or similar program Patient location: home  Provider location: [place] at [facility], office  Participants: Patient and me (unless stated otherwise below)  Pandemic considerations d/w pt.   Limitations and rationale for visit method d/w patient.  Patient agreed to proceed.   CC: URI sx.    HPI:  She is still caring for covid patients at work.    Patient has had sinus pressure, nasal congestion for about 1 week. Low grade fever 2 days ago, and not since.  No chest sx.  Not SOB.  No vomiting, no diarrhea.  Patient took two at home covid tests that were negative and did PCR test today; results not back yet. Patient had been taking sudafed and ibuprofen and was not helping a lot; was given doxycycline yesterday that she started this morning.  No sputum.  No wheeze.  She still has sense of taste and smell.  Facial pain is bothersome, better with warm compresses.  No ST now but prev did.  Some ear pressure.  She is lightheaded on standing/weak on standing- this isn't getting worse.  Cautions d/w pt. D/w pt about fluid intake- she increased in the meantime.  BP 110/72 today.  Pulse 92.    Meds and allergies reviewed.   ROS: Per HPI unless specifically indicated in ROS section   NAD Speech wnl  A/P:  URI symptoms.  She is getting a Covid test done through work.  I do not suspect Covid, is more likely to be sinus infection.  Routine cautions given to patient.  Supportive care.  Continue doxycycline.  Update me as needed.  She agrees.  Okay for outpatient follow-up.

## 2020-08-31 NOTE — Progress Notes (Signed)
Patient has had sinus pressure, nasal congestion and a fever for about 1 week. Patient took two at home covid tests that were negative and did PCR test today; results not back yet. Patient had been taking sudafed and ibuprofen and was not helping; was given doxycycline yesterday that she started this morning.

## 2020-09-01 DIAGNOSIS — J069 Acute upper respiratory infection, unspecified: Secondary | ICD-10-CM | POA: Insufficient documentation

## 2020-09-01 NOTE — Assessment & Plan Note (Signed)
She is getting a Covid test done through work.  I do not suspect Covid, is more likely to be sinus infection.  Routine cautions given to patient.  Supportive care.  Continue doxycycline.  Update me as needed.  She agrees.  Okay for outpatient follow-up.

## 2020-09-03 ENCOUNTER — Telehealth: Payer: Self-pay

## 2020-09-03 NOTE — Telephone Encounter (Signed)
She is already on doxycycline recently started for sinus infection. rec continue this, push fluids and rest. Let us know if not improving into next week. Let us know if COVID PCR returns positive.

## 2020-09-03 NOTE — Telephone Encounter (Signed)
Was seen 08/31/2020 virtual with Dr Para March.Marland KitchenMarland KitchenPt called the office to report that she awoke this morning with a headache and fever again of 100... no other Sx or concerns  Pt advised to alternate Tylenol, ibuprofen with drinking plenty of fluids for headache and fever in the meantime  Pt advised go to the nearest hospital emergency room if she has a fever that won't come down with medicine

## 2020-09-05 NOTE — Telephone Encounter (Signed)
Per my chart message, her Covid test was negative.  Please get update on patient Monday.  Thanks.

## 2020-09-06 NOTE — Telephone Encounter (Signed)
Spoke with patient and she is doing much better; patient has not had any sx since yesterday.

## 2020-09-06 NOTE — Telephone Encounter (Signed)
Noted. Thanks.

## 2020-09-07 ENCOUNTER — Encounter: Payer: Self-pay | Admitting: Primary Care

## 2020-09-07 ENCOUNTER — Other Ambulatory Visit: Payer: Self-pay

## 2020-09-07 ENCOUNTER — Other Ambulatory Visit: Payer: Self-pay | Admitting: Primary Care

## 2020-09-07 ENCOUNTER — Ambulatory Visit (INDEPENDENT_AMBULATORY_CARE_PROVIDER_SITE_OTHER): Payer: No Typology Code available for payment source | Admitting: Primary Care

## 2020-09-07 VITALS — BP 110/62 | HR 91 | Temp 98.7°F | Ht 66.0 in | Wt 142.0 lb

## 2020-09-07 DIAGNOSIS — G8929 Other chronic pain: Secondary | ICD-10-CM

## 2020-09-07 DIAGNOSIS — M542 Cervicalgia: Secondary | ICD-10-CM

## 2020-09-07 MED ORDER — PREDNISONE 20 MG PO TABS: ORAL_TABLET | ORAL | 0 refills | Status: DC

## 2020-09-07 MED ORDER — CYCLOBENZAPRINE HCL 5 MG PO TABS: 5.0000 mg | ORAL_TABLET | Freq: Three times a day (TID) | ORAL | 0 refills | Status: DC | PRN

## 2020-09-07 NOTE — Patient Instructions (Signed)
You will be contacted regarding your referral to physical therapy.  Please let us know if you have not been contacted within two weeks.   Start prednisone 20 mg for neck pain. Take 3 tablets for two days, then 2 tablets for three days days, then 1 tablet for three days.   You may take the cyclobenzaprine 5 mg up to three times daily for muscle spasms. This may cause drowsiness. Start at bedtime.   It was a pleasure meeting you!

## 2020-09-07 NOTE — Progress Notes (Signed)
Subjective:    Patient ID: Jessica [name], female    DOB: Dec 28, 1995, 25 y.o.   MRN: [id number]  HPI  This visit occurred during the SARS-CoV-2 public health emergency.  Safety protocols were in place, including screening questions prior to the visit, additional usage of staff PPE, and extensive cleaning of exam room while observing appropriate contact time as indicated for disinfecting solutions.   Jessica [name] is a 25 year old female patient of Dr. Para March with a history of migraines, POTS, anorexia nervosa who presents today with a chief complaint of neck pain.   Her pain is located to the left lateral neck which began several months ago. Over the last month she's experiencing radiation of pain to her left upper extremity extending to her elbow.   She's tried application of heat and ice, massage, Tylenol, Aleve, Ibuprofen with minimal improvement.  She spoke with her PCP in mid January who treated with prednisone 20 mg x 5 days and 10 mg x 5 days. This helped some while she was taking prednisone, but symptoms returned soon after.   She denies weakness to her left upper extremity, numbness, trauma. She's mostly bothered by her pain when resting, working (works as a Engineer, civil (consulting)) and sleeping. She's tried some her mother's Robaxin which helps with sleep, but pain persists throughout the day.   Review of Systems  Musculoskeletal: Positive for arthralgias and myalgias.  Neurological: Negative for weakness and numbness.       Past Medical History:  Diagnosis Date  . Adjustment disorder with mixed anxiety and depressed mood   . Anorexia nervosa   . Autonomic dysfunction   . Migraine headache   . POTS (postural orthostatic tachycardia syndrome)   . 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 Syncope 09/22/2014  . Vasovagal syncope      Social History   Socioeconomic History  . Marital status: Married    Spouse name: Lindwood Qua  . Number of children: 0  . Years of education: Not on file  . Highest  education level: Bachelor's degree (e.g., BA, AB, BS)  Occupational History    Comment: RN [employer]  Tobacco Use  . Smoking status: Never Smoker  . Smokeless tobacco: Never Used  Vaping Use  . Vaping Use: Never used  Substance and Sexual Activity  . Alcohol use: Yes    Alcohol/week: 0.0 standard drinks    Comment: , wine 1-2 times a month, socially   . Drug use: No  . Sexual activity: Never    Birth control/protection: Pill  Other Topics Concern  . Not on file  Social History Narrative   RN at [employer]   //   She lives with husband    caffeine coffee 2 c daily   Social Determinants of Health   Financial Resource Strain: Not on file  Food Insecurity: Not on file  Transportation Needs: Not on file  Physical Activity: Not on file  Stress: Not on file  Social Connections: Not on file  Intimate Partner Violence: Not on file    Past Surgical History:  Procedure Laterality Date  . ANTERIOR CRUCIATE LIGAMENT REPAIR  03/2010  . EYE SURGERY  2000   clogged tear duct  . WISDOM TOOTH EXTRACTION  2015    Family History  Problem Relation Age of Onset  . Migraines Mother        Started 4th or 5th grade  . Seizures Mother  Febrile Seizures as a child  . Seizures Father        Febrile Seizures as a child  . Hypertension Father   . Kidney Stones Father   . Migraines Maternal Aunt   . Migraines Maternal Uncle   . Diabetes Maternal Grandmother   . Stroke Maternal Grandfather        mini-stroke  . Cancer Maternal Grandfather        Bladder cancer, Died at 44  . Diabetes Maternal Grandfather   . Mental retardation Other        Maternal Second Cousin  . Other Other        Maternal Great Uncle had some sort of Retinal Vessel Occlusion  . Breast cancer Maternal Aunt   . Cancer Maternal Aunt        Peritoneal CA  . Eating disorder Maternal Aunt   . Depression Other        Father's side of the family  . Colon cancer Neg Hx     Allergies  Allergen  Reactions  . Amoxicillin Rash    Current Outpatient Medications on File Prior to Visit  Medication Sig Dispense Refill  . atenolol (TENORMIN) 25 MG tablet TAKE ONE-HALF TABLET (12.5 MG TOTAL) PER DAY 45 tablet 3  . doxycycline (VIBRA-TABS) 100 MG tablet Take 1 tablet (100 mg total) by mouth 2 (two) times daily. 20 tablet 0  . fludrocortisone (FLORINEF) 0.1 MG tablet Take 0.5 tablets (50 mcg total) by mouth daily.    Marland Kitchen FLUoxetine (PROZAC) 40 MG capsule TAKE 1 CAPSULE BY MOUTH EVERY DAY 90 capsule 3  . Fremanezumab-vfrm (AJOVY) 225 MG/1.5ML SOAJ Inject 225 mg into the skin every 30 (thirty) days. 3 pen 4  . JUNEL 1.5/30 1.5-30 MG-MCG tablet TAKE 1 TABLET BY MOUTH DAILY. TAKE ONE WEEK OFF FROM ACTIVE MEDS EVERY 90 DAYS. 84 tablet 3  . ondansetron (ZOFRAN-ODT) 4 MG disintegrating tablet Take 1-2 tablets (4-8 mg total) by mouth every 8 (eight) hours as needed for nausea. 30 tablet 11  . pantoprazole (PROTONIX) 40 MG tablet Take 1 tablet (40 mg total) by mouth daily. 90 tablet 1  . rizatriptan (MAXALT-MLT) 10 MG disintegrating tablet Take 1 tablet (10 mg total) by mouth as needed for migraine. May repeat in 2 hours if needed 9 tablet 11  . topiramate ER (QUDEXY XR) 50 MG CS24 sprinkle capsule TAKE 1 CAPSULE (50 MG TOTAL) BY MOUTH AT BEDTIME. 30 capsule 6   No current facility-administered medications on file prior to visit.    BP 110/62   Pulse 91   Temp 98.7 F (37.1 C) (Temporal)   Ht 5\' 6"  (1.676 m)   Wt 142 lb (64.4 kg)   SpO2 96%   BMI 22.92 kg/m    Objective:   Physical Exam Constitutional:      General: She is not in acute distress. Neck:      Comments: Obvious muscle tension noted to left upper lateral neck.  Pulmonary:     Effort: Pulmonary effort is normal.  Musculoskeletal:     Cervical back: Normal range of motion. No spinous process tenderness or muscular tenderness.  Skin:    General: Skin is warm and dry.  Neurological:     Mental Status: She is alert.             Assessment & Plan:

## 2020-09-07 NOTE — Assessment & Plan Note (Addendum)
Chronic for months, no permanent improvement with conservative treatment. Exam today without alarm signs.   Rx for stronger steroid dose of prednisone 20 mg as this was beneficial.   Rx for Flexeril 5 mg sent to pharmacy to use HS, drowsiness precautions provided.   Referral for physical therapy placed. She will update.

## 2020-09-17 ENCOUNTER — Telehealth: Payer: Self-pay | Admitting: Family Medicine

## 2020-09-17 NOTE — Telephone Encounter (Signed)
Re sending referral to [facility] for PT services.

## 2020-09-17 NOTE — Telephone Encounter (Signed)
The referral can not be there at that location because its out of network for the insurance and her secondary can not be billed as primary

## 2020-09-19 NOTE — Telephone Encounter (Signed)
Please let me know if I need to put in a new referral.  Thanks.

## 2020-10-07 NOTE — Telephone Encounter (Signed)
Per [employer], approved effective 10/07/2020 - 10/07/2021. Faxed approval letter to pharmacy. Received a receipt of confirmation. Updated patient.

## 2020-10-07 NOTE — Telephone Encounter (Signed)
Completed Ajovy PA on Cover My Meds. Key: [id number]. Awaiting determination from [employer].

## 2020-10-25 ENCOUNTER — Other Ambulatory Visit (HOSPITAL_COMMUNITY): Payer: Self-pay

## 2020-10-26 ENCOUNTER — Other Ambulatory Visit (HOSPITAL_COMMUNITY): Payer: Self-pay

## 2020-10-29 ENCOUNTER — Other Ambulatory Visit (HOSPITAL_COMMUNITY): Payer: Self-pay

## 2020-10-29 MED FILL — Topiramate Cap ER 24HR Sprinkle 50 MG: ORAL | 30 days supply | Qty: 30 | Fill #0 | Status: AC

## 2020-11-15 ENCOUNTER — Other Ambulatory Visit (HOSPITAL_COMMUNITY): Payer: Self-pay

## 2020-11-15 MED FILL — Fremanezumab-vfrm Subcutaneous Soln Auto-inj 225 MG/1.5ML: SUBCUTANEOUS | 30 days supply | Qty: 1.5 | Fill #0 | Status: CN

## 2020-11-16 ENCOUNTER — Other Ambulatory Visit: Payer: Self-pay | Admitting: Neurology

## 2020-11-16 ENCOUNTER — Other Ambulatory Visit (HOSPITAL_COMMUNITY): Payer: Self-pay

## 2020-11-16 MED ORDER — EMGALITY 120 MG/ML ~~LOC~~ SOAJ
120.0000 mg | SUBCUTANEOUS | 11 refills | Status: DC
  Filled 2020-11-16 – 2020-12-24 (×2): qty 1, 30d supply, fill #0
  Filled 2021-01-25: qty 1, 30d supply, fill #1
  Filled 2021-03-02: qty 1, 30d supply, fill #2
  Filled 2021-05-09: qty 1, 30d supply, fill #3

## 2020-11-16 MED FILL — Fremanezumab-vfrm Subcutaneous Soln Auto-inj 225 MG/1.5ML: SUBCUTANEOUS | 30 days supply | Qty: 1.5 | Fill #0 | Status: CN

## 2020-11-17 ENCOUNTER — Telehealth: Payer: Self-pay | Admitting: *Deleted

## 2020-11-17 NOTE — Telephone Encounter (Signed)
The request has been approved. The authorization is effective for a maximum of 1 fills from 11/17/2020 to 12/16/2020, as long as the member is enrolled in their current health plan. The request was approved as submitted. This request has been approved for 2 pens (44mL) as a 30 day supply.An additional authorization has been entered for 36mL (1 pen) per 30 days effective 12/10/2020 through 05/11/2021 for 5 fills, please reference authorization [id number]. A written notification letter will follow with additional details.   Updated pharmacy via fax. Received a receipt of confirmation.

## 2020-11-17 NOTE — Telephone Encounter (Signed)
Completed Emgality PA on Cover My Meds. Key: [id number]. Awaiting determination from [employer].

## 2020-11-18 ENCOUNTER — Other Ambulatory Visit (HOSPITAL_COMMUNITY): Payer: Self-pay

## 2020-11-26 ENCOUNTER — Other Ambulatory Visit (HOSPITAL_COMMUNITY): Payer: Self-pay

## 2020-11-28 ENCOUNTER — Encounter (INDEPENDENT_AMBULATORY_CARE_PROVIDER_SITE_OTHER): Payer: Self-pay

## 2020-11-29 MED FILL — Topiramate Cap ER 24HR Sprinkle 50 MG: ORAL | 30 days supply | Qty: 30 | Fill #1 | Status: AC

## 2020-11-30 ENCOUNTER — Other Ambulatory Visit (HOSPITAL_COMMUNITY): Payer: Self-pay

## 2020-12-20 ENCOUNTER — Encounter: Payer: Self-pay | Admitting: Family Medicine

## 2020-12-21 ENCOUNTER — Ambulatory Visit (INDEPENDENT_AMBULATORY_CARE_PROVIDER_SITE_OTHER): Payer: No Typology Code available for payment source | Admitting: Family Medicine

## 2020-12-21 ENCOUNTER — Other Ambulatory Visit: Payer: Self-pay

## 2020-12-21 ENCOUNTER — Encounter: Payer: Self-pay | Admitting: Family Medicine

## 2020-12-21 VITALS — BP 116/84 | HR 101 | Temp 98.5°F | Ht 66.0 in | Wt 148.0 lb

## 2020-12-21 DIAGNOSIS — W57XXXA Bitten or stung by nonvenomous insect and other nonvenomous arthropods, initial encounter: Secondary | ICD-10-CM | POA: Diagnosis not present

## 2020-12-21 DIAGNOSIS — S20361A Insect bite (nonvenomous) of right front wall of thorax, initial encounter: Secondary | ICD-10-CM

## 2020-12-21 MED ORDER — DOXYCYCLINE HYCLATE 100 MG PO TABS: 100.0000 mg | ORAL_TABLET | Freq: Two times a day (BID) | ORAL | 0 refills | Status: DC

## 2020-12-21 NOTE — Progress Notes (Signed)
Patient ID: Jessica [name], female    DOB: [birth year], 24 y.o.   MRN: [id number]  This visit was conducted in person.  BP 116/84   Pulse (!) 101   Temp 98.5 F (36.9 C) (Temporal)   Ht 5\' 6"  (1.676 m)   Wt 148 lb (67.1 kg)   SpO2 97%   BMI 23.89 kg/m    CC:  Chief Complaint  Patient presents with   tick bite    Subjective:   HPI: Jessica [name] is a 25 y.o. female presenting on 12/21/2020 for tick bite  She pulls off ticks every few days where she lives.  She removed on off 10-14 days ago.. possibly lone star tick.   Yesterday noted target like rash, no heat, no pain, no itching. No joint pain, has a headache x 3 days( but does have history of migraine).  No fever, No N/V. No flu like illness, no fatigue.   no numbness, no weakness, no confusion.   Has not treated with anything except ibuprofen.       Relevant past medical, surgical, family and social history reviewed and updated as indicated. Interim medical history since our last visit reviewed. Allergies and medications reviewed and updated. Outpatient Medications Prior to Visit  Medication Sig Dispense Refill   cyclobenzaprine (FLEXERIL) 5 MG tablet TAKE 1 TABLET BY MOUTH 3 TIMES DAILY AS NEEDED FOR MUSCLE SPASMS. 30 tablet 0   FLUoxetine (PROZAC) 40 MG capsule TAKE 1 CAPSULE BY MOUTH EVERY DAY 90 capsule 3   Galcanezumab-gnlm (EMGALITY) 120 MG/ML SOAJ Inject 120 mg into the skin every 30 (thirty) days. 1 mL 11   JUNEL 1.5/30 1.5-30 MG-MCG tablet TAKE 1 TABLET BY MOUTH DAILY. TAKE ONE WEEK OFF FROM ACTIVE MEDS EVERY 90 DAYS. 84 tablet 3   ondansetron (ZOFRAN-ODT) 4 MG disintegrating tablet Take 1-2 tablets (4-8 mg total) by mouth every 8 (eight) hours as needed for nausea. 30 tablet 11   pantoprazole (PROTONIX) 40 MG tablet TAKE 1 TABLET (40 MG TOTAL) BY MOUTH DAILY. 90 tablet 1   rizatriptan (MAXALT-MLT) 10 MG disintegrating tablet Take 1 tablet (10 mg total) by mouth as needed for migraine. May repeat in 2  hours if needed 9 tablet 11   topiramate ER (QUDEXY XR) 50 MG CS24 sprinkle capsule TAKE 1 CAPSULE (50 MG TOTAL) BY MOUTH AT BEDTIME. 30 capsule 6   atenolol (TENORMIN) 25 MG tablet TAKE ONE-HALF TABLET (12.5 MG TOTAL) PER DAY 45 tablet 3   doxycycline (VIBRA-TABS) 100 MG tablet TAKE 1 TABLET (100 MG TOTAL) BY MOUTH 2 (TWO) TIMES DAILY. 20 tablet 0   fludrocortisone (FLORINEF) 0.1 MG tablet Take 0.5 tablets (50 mcg total) by mouth daily.     Norethindrone Acetate-Ethinyl Estradiol (LOESTRIN) 1.5-30 MG-MCG tablet TAKE 1 TABLET BY MOUTH AS DIRECTED 63 tablet 5   predniSONE (DELTASONE) 20 MG tablet TAKE 3 TABLETS BY MOUTH FOR 2 DAYS, THEN 2 TABLETS FOR 3 DAYS DAYS, THEN 1 TABLET FOR 3 DAYS. 15 tablet 0   No facility-administered medications prior to visit.     Per HPI unless specifically indicated in ROS section below Review of Systems  Constitutional:  Negative for fatigue and fever.  HENT:  Negative for congestion.   Eyes:  Negative for pain.  Respiratory:  Negative for cough and shortness of breath.   Cardiovascular:  Negative for chest pain, palpitations and leg swelling.  Gastrointestinal:  Negative for abdominal pain.  Genitourinary:  Negative for dysuria and vaginal  bleeding.  Musculoskeletal:  Negative for back pain.  Skin:  Positive for rash.  Neurological:  Negative for syncope, light-headedness and headaches.  Psychiatric/Behavioral:  Negative for dysphoric mood.   All other systems reviewed and are negative. Objective:  BP 116/84   Pulse (!) 101   Temp 98.5 F (36.9 C) (Temporal)   Ht 5\' 6"  (1.676 m)   Wt 148 lb (67.1 kg)   SpO2 97%   BMI 23.89 kg/m   Wt Readings from Last 3 Encounters:  12/21/20 148 lb (67.1 kg)  09/07/20 142 lb (64.4 kg)  08/31/20 140 lb (63.5 kg)      Physical Exam Constitutional:      General: She is not in acute distress.    Appearance: Normal appearance. She is well-developed. She is not ill-appearing or toxic-appearing.  HENT:     Head:  Normocephalic.     Right Ear: Hearing, tympanic membrane, ear canal and external ear normal. Tympanic membrane is not erythematous, retracted or bulging.     Left Ear: Hearing, tympanic membrane, ear canal and external ear normal. Tympanic membrane is not erythematous, retracted or bulging.     Nose: No mucosal edema or rhinorrhea.     Right Sinus: No maxillary sinus tenderness or frontal sinus tenderness.     Left Sinus: No maxillary sinus tenderness or frontal sinus tenderness.     Mouth/Throat:     Pharynx: Uvula midline.  Eyes:     General: Lids are normal. Lids are everted, no foreign bodies appreciated.     Conjunctiva/sclera: Conjunctivae normal.     Pupils: Pupils are equal, round, and reactive to light.  Neck:     Thyroid: No thyroid mass or thyromegaly.     Vascular: No carotid bruit.     Trachea: Trachea normal.  Cardiovascular:     Rate and Rhythm: Normal rate and regular rhythm.     Pulses: Normal pulses.     Heart sounds: Normal heart sounds, S1 normal and S2 normal. No murmur heard.   No friction rub. No gallop.  Pulmonary:     Effort: Pulmonary effort is normal. No tachypnea or respiratory distress.     Breath sounds: Normal breath sounds. No decreased breath sounds, wheezing, rhonchi or rales.  Abdominal:     General: Bowel sounds are normal.     Palpations: Abdomen is soft.     Tenderness: There is no abdominal tenderness.  Musculoskeletal:     Cervical back: Normal range of motion and neck supple.  Skin:    General: Skin is warm and dry.     Findings: No rash.     Comments: Target like rash   Neurological:     Mental Status: She is alert.  Psychiatric:        Mood and Affect: Mood is not anxious or depressed.        Speech: Speech normal.        Behavior: Behavior normal. Behavior is cooperative.        Thought Content: Thought content normal.        Judgment: Judgment normal.      Results for orders placed or performed in visit on 05/06/20  TSH   Result Value Ref Range   TSH 1.77 0.35 - 4.50 uIU/mL  Basic metabolic panel  Result Value Ref Range   Sodium 140 135 - 145 mEq/L   Potassium 4.0 3.5 - 5.1 mEq/L   Chloride 105 96 - 112 mEq/L   CO2 26  19 - 32 mEq/L   Glucose, Bld 86 70 - 99 mg/dL   BUN 13 6 - 23 mg/dL   Creatinine, Ser 8.75 0.40 - 1.20 mg/dL   GFR 64.33 >29.51 mL/min   Calcium 9.5 8.4 - 10.5 mg/dL  CBC with Differential/Platelet  Result Value Ref Range   WBC 8.5 4.0 - 10.5 K/uL   RBC 4.56 3.87 - 5.11 Mil/uL   Hemoglobin 13.8 12.0 - 15.0 g/dL   HCT 88.4 16.6 - 06.3 %   MCV 91.9 78.0 - 100.0 fl   MCHC 32.9 30.0 - 36.0 g/dL   RDW 01.6 01.0 - 93.2 %   Platelets 309.0 150.0 - 400.0 K/uL   Neutrophils Relative % 46.5 43.0 - 77.0 %   Lymphocytes Relative 43.3 12.0 - 46.0 %   Monocytes Relative 6.8 3.0 - 12.0 %   Eosinophils Relative 1.5 0.0 - 5.0 %   Basophils Relative 1.9 0.0 - 3.0 %   Neutro Abs 4.0 1.4 - 7.7 K/uL   Lymphs Abs 3.7 0.7 - 4.0 K/uL   Monocytes Absolute 0.6 0.1 - 1.0 K/uL   Eosinophils Absolute 0.1 0.0 - 0.7 K/uL   Basophils Absolute 0.2 (H) 0.0 - 0.1 K/uL    This visit occurred during the SARS-CoV-2 public health emergency.  Safety protocols were in place, including screening questions prior to the visit, additional usage of staff PPE, and extensive cleaning of exam room while observing appropriate contact time as indicated for disinfecting solutions.   COVID 19 screen:  No recent travel or known exposure to COVID19 The patient denies respiratory symptoms of COVID 19 at this time. The importance of social distancing was discussed today.   Assessment and Plan    Problem List Items Addressed This Visit     Tick bite of right front wall of thorax - Primary    Will treat empirically with doxycycline.  Eval with tick borne illness labs.  ER precautions given.  Meds ordered this encounter  Medications   DISCONTD: doxycycline (VIBRA-TABS) 100 MG tablet    Sig: Take 1 tablet (100 mg total) by  mouth 2 (two) times daily.    Dispense:  20 tablet    Refill:  0   Orders Placed This Encounter  Procedures   B. burgdorfi antibodies by WB   Rocky mtn spotted fvr abs pnl(IgG+IgM)         Relevant Orders   B. burgdorfi antibodies by WB (Completed)   Rocky mtn spotted fvr abs pnl(IgG+IgM) (Completed)     Kerby Nora, MD

## 2020-12-21 NOTE — Telephone Encounter (Signed)
[facility] Night - Client TELEPHONE ADVICE RECORD AccessNurse Patient Name: ROMEKA SCIFRES ENS Gender: Female DOB: [birth year] Age: 25 Y 6 M 14 D Return Phone Number: [phone] (Primary) Address: City/ State/ Zip: [place] [address] Client [facility] Night - Client Client Site [facility] - Night Physician Raechel Ache - MD Contact Type Call Who Is Calling Patient / Member / Family / Caregiver Call Type Triage / Clinical Relationship To Patient Self Return Phone Number [phone] (Primary) Chief Complaint Tick Bite Reason for Call Symptomatic / Request for Health Information Initial Comment Caller says she has a tick bite that now has a bullseye rash around it Translation No Nurse Assessment Nurse: Tiburcio Pea, RN, Rosanne Sack Date/Time (Eastern Time): 12/20/2020 7:55:56 PM Confirm and document reason for call. If symptomatic, describe symptoms. ---Caller states she had a tick bite about 10 days ago and now she has a bulls eye rash under her right breast. Patient denies a fever or a headache at this time. Does the patient have any new or worsening symptoms? ---Yes Will a triage be completed? ---Yes Related visit to physician within the last 2 weeks? ---No Does the PT have any chronic conditions? (i.e. diabetes, asthma, this includes High risk factors for pregnancy, etc.) ---Yes List chronic conditions. ---POTS, Migraines Is the patient pregnant or possibly pregnant? (Ask all females between the ages of 74-55) ---No Is this a behavioral health or substance abuse call? ---No Guidelines Guideline Title Affirmed Question Affirmed Notes Nurse Date/Time (Eastern Time) Tick Bite Red ring or bull's-eye rash occurs at tick bite Tiburcio Pea, RN, Rosanne Sack 12/20/2020 7:58:03 PM Disp. Time Lamount Cohen Time) Disposition Final User 12/20/2020 7:59:38 PM See PCP within 24 Hours Yes Tiburcio Pea, RN, Rosanne Sack PLEASE NOTE: All timestamps  contained within this report are represented as [place] Standard Time. CONFIDENTIALTY NOTICE: This fax transmission is intended only for the addressee. It contains information that is legally privileged, confidential or otherwise protected from use or disclosure. If you are not the intended recipient, you are strictly prohibited from reviewing, disclosing, copying using or disseminating any of this information or taking any action in reliance on or regarding this information. If you have received this fax in error, please notify us immediately by telephone so that we can arrange for its return to [place]. Phone: [phone], Toll-Free: [phone], Fax: [phone] Page: 2 of 2 Call Id: [id number] Caller Disagree/Comply Comply Caller Understands Yes PreDisposition Home Care Care Advice Given Per Guideline SEE PCP WITHIN 24 HOURS: * IF OFFICE WILL BE OPEN: You need to be examined within the next 24 hours. Call your doctor (or NP/PA) when the office opens and make an appointment. ANTIBIOTIC OINTMENT: * Apply antibiotic ointment (OTC) to the infected area 3 times per day. CALL BACK IF: * Fever occurs * You become worse CARE ADVICE given per Tick Bites (Adult) guideline. Referrals REFERRED TO PCP OFFICE

## 2020-12-21 NOTE — Patient Instructions (Addendum)
Please stop at the lab to have labs drawn.  Tick Bite Information, Adult Ticks are insects that draw blood for food. Most ticks live in shrubs and grassy and wooded areas. They climb onto people and animals that brush against the leaves and grasses that they rest on. Then they bite, attaching themselves to the skin. Most ticks are harmless, but some ticks may carry germs that can spread to a person through a bite and cause a disease. To reduce your risk of getting a disease from a tick bite, make sure you:  Take steps to prevent tick bites.  Check for ticks after being outdoors where ticks live.  Watch for symptoms of disease if a tick attached to you or if you suspect a tick bite. How can I prevent tick bites? Take these steps to help prevent tick bites when you go outdoors in an area where ticks live: Use insect repellent  Use insect repellent that has DEET (20% or higher), picaridin, or IR3535 in it. Follow the instructions on the label. Use these products on: ? Bare skin. ? The top of your boots. ? Your pant legs. ? Your sleeve cuffs.  For insect repellent that contains permethrin, follow the instructions on the label. Use these products on: ? Clothing. ? Boots. ? Outdoor gear. ? Tents. When you are outside  Wear protective clothing. Long sleeves and long pants offer the best protection from ticks.  Wear light-colored clothing so you can see ticks more easily.  Tuck your pant legs into your socks.  If you go walking on a trail, stay in the middle of the trail so your skin, hair, and clothing do not touch the bushes.  Avoid walking through areas with long grass.  Check for ticks on your clothing, hair, and skin often while you are outside, and check again before you go inside. Make sure to check the scalp, neck, armpits, waist, groin, and joint areas. These are the spots where ticks attach themselves most often. When you go indoors  Check your clothing for ticks. Tumble  dry clothes in a dryer on high heat for at least 10 minutes. If clothes are damp, additional time may be needed. If clothes require washing, use hot water.  Examine gear and pets.  Shower soon after being outdoors.  Check your body for ticks. Conduct a full body check using a mirror. What is the proper way to remove a tick? If you find a tick on your body, remove it as soon as possible. Removing a tick sooner can prevent germs from passing to your body. Do not remove the tick with your bare fingers. To remove a tick that is crawling on your skin but has not bitten, use either of these methods:  Go outdoors and brush the tick off.  Remove the tick with tape or a lint roller. To remove a tick that is attached to your skin: 1. Wash your hands. If you have latex gloves, put them on. 2. Use fine-tipped tweezers, curved forceps, or a tick-removal tool to gently grasp the tick as close to your skin and the tick's head as possible. 3. Gently pull with a steady, upward, even pressure until the tick lets go. 4. When removing the tick: ? Take care to keep the tick's head attached to its body. ? Do not twist or jerk the tick. This can make the tick's head or mouth parts break off and remain in the skin. ? Do not squeeze or crush the  tick's body. This could force disease-carrying fluids from the tick into your body. Do not try to remove a tick with heat, alcohol, petroleum jelly, or fingernail polish. Using these methods can cause the tick to salivate and regurgitate into your bloodstream, increasing your risk of getting a disease.   What should I do after removing a tick?  Dispose of the tick. Do not crush a tick with your fingers.  Clean the bite area and your hands with soap and water, rubbing alcohol, or an iodine scrub.  If an antiseptic cream or ointment is available, apply a small amount to the bite site.  Wash and disinfect any instruments that you used to remove the tick. How should I  dispose of a tick? To dispose of a live tick, use one of these methods:  Place it in rubbing alcohol.  Place it in a sealed bag or container.  Wrap it tightly in tape.  Flush it down the toilet. Contact a health care provider if:  You have symptoms of a disease after a tick bite. Symptoms of a tick-borne disease can occur from moments after the tick bites to 30 days after a tick is removed. Symptoms include: ? Fever or chills. ? Any of these signs in the bite area:  A red rash that makes a circle (bull's-eye rash) in the bite area.  Redness and swelling. ? Headache. ? Muscle, joint, or bone pain. ? Abnormal tiredness. ? Numbness in your legs or difficulty walking or moving your legs. ? Tender, swollen lymph glands.  A part of a tick breaks off and gets stuck in your skin. Get help right away if:  You are not able to remove a tick.  You experience muscle weakness or paralysis.  Your symptoms get worse or you experience new symptoms.  You find an engorged tick on your skin and you are in an area where disease from ticks is a high risk. Summary  Ticks may carry germs that can spread to a person through a bite and cause a disease.  Wear protective clothing and use insect repellent to prevent tick bites. Follow the instructions on the label.  If you find a tick on your body, remove it as soon as possible. If the tick is attached, do not try to remove with heat, alcohol, petroleum jelly, or fingernail polish.  Remove the attached tick using fine-tipped tweezers, curved forceps, or a tick-removal tool. Gently pull with steady, upward, even pressure until the tick lets go. Do not twist or jerk the tick. Do not squeeze or crush the tick's body.  If you have symptoms of a disease after being bitten by a tick, contact a health care provider. This information is not intended to replace advice given to you by your health care provider. Make sure you discuss any questions you have  with your health care provider. Document Revised: 07/07/2019 Document Reviewed: 07/07/2019 Elsevier Patient Education  2021 [employer].

## 2020-12-23 LAB — B. BURGDORFI ANTIBODIES BY WB

## 2020-12-23 LAB — ROCKY MTN SPOTTED FVR ABS PNL(IGG+IGM)
RMSF IgG: NOT DETECTED
RMSF IgM: NOT DETECTED

## 2020-12-24 ENCOUNTER — Other Ambulatory Visit (HOSPITAL_COMMUNITY): Payer: Self-pay

## 2020-12-24 ENCOUNTER — Telehealth: Payer: Self-pay

## 2020-12-24 NOTE — Telephone Encounter (Signed)
Pt called back in and says that she has been using hydrocortisone cream and triamcenolone on her face and it hasn't helped at all. Pt will be sending over pics of her rash via mychart.

## 2020-12-24 NOTE — Telephone Encounter (Signed)
Patient notified to stop taking doxy and use topical hydrocortisone cream on the rash as needed. Advised is rash worsens or develops other sx to call the triage line over the weekend. Patient verbalized understanding.

## 2020-12-24 NOTE — Telephone Encounter (Signed)
Please call pt.  Given the negative tick labs, I would stop doxycycline and use topical hydrocortisone on the rash as needed if itching in the meantime.  If worse in the meantime (fever, chills, streaky rash c/w cellulitis) then let me know over the weekend.  I am on call and she can contact me through the triage line.  Thanks.

## 2020-12-24 NOTE — Telephone Encounter (Signed)
Patient was seen on Tuesday for bulls eye rash after tick bite. Was started on Doxycycline this morning started having rash on face from nose down no both sides. Does not itch. Feels like the bulls eye rash has increased as well.   Denies any swelling of face or mouth. No issues with swallowing or breathing. No headache or dizziness.   Advised red words for ED and not to take doxy until hears back from [place].

## 2020-12-26 NOTE — Telephone Encounter (Signed)
Late entry.  I called patient back on 12/24/20 at the end of clinic.  We talked about options.  The concern for the new rash is a doxycycline allergy.  She has not been out in the sun so photosensitivity is not likely an issue.  Given her recent negative labs, it is reasonable to stop doxycycline and see how she feels in the near future.  We can always retest her regarding tick labs if needed.  She is aware that I am on-call this weekend and she can call back if she has new symptoms or concerns over the weekend where she can update me on Monday.  She agrees with plan and is okay for outpatient follow-up.  Allergy list updated.

## 2020-12-29 ENCOUNTER — Other Ambulatory Visit (HOSPITAL_COMMUNITY): Payer: Self-pay

## 2020-12-29 MED FILL — Topiramate Cap ER 24HR Sprinkle 50 MG: ORAL | 30 days supply | Qty: 30 | Fill #2 | Status: AC

## 2021-01-10 ENCOUNTER — Other Ambulatory Visit (HOSPITAL_COMMUNITY): Payer: Self-pay

## 2021-01-10 MED FILL — Norethindrone Ace & Ethinyl Estradiol Tab 1.5 MG-30 MCG: ORAL | 84 days supply | Qty: 63 | Fill #0 | Status: AC

## 2021-01-19 ENCOUNTER — Ambulatory Visit: Payer: BC Managed Care – PPO | Admitting: Neurology

## 2021-01-25 ENCOUNTER — Other Ambulatory Visit (HOSPITAL_COMMUNITY): Payer: Self-pay

## 2021-01-31 ENCOUNTER — Other Ambulatory Visit (HOSPITAL_COMMUNITY): Payer: Self-pay

## 2021-01-31 MED FILL — Topiramate Cap ER 24HR Sprinkle 50 MG: ORAL | 30 days supply | Qty: 30 | Fill #3 | Status: AC

## 2021-02-03 DIAGNOSIS — S20361A Insect bite (nonvenomous) of right front wall of thorax, initial encounter: Secondary | ICD-10-CM | POA: Insufficient documentation

## 2021-02-03 DIAGNOSIS — W57XXXA Bitten or stung by nonvenomous insect and other nonvenomous arthropods, initial encounter: Secondary | ICD-10-CM | POA: Insufficient documentation

## 2021-02-03 NOTE — Assessment & Plan Note (Signed)
Will treat empirically with doxycycline.  Eval with tick borne illness labs.  ER precautions given.  Meds ordered this encounter  Medications  . DISCONTD: doxycycline (VIBRA-TABS) 100 MG tablet    Sig: Take 1 tablet (100 mg total) by mouth 2 (two) times daily.    Dispense:  20 tablet    Refill:  0   Orders Placed This Encounter  Procedures  . B. burgdorfi antibodies by WB  . Rocky mtn spotted fvr abs pnl(IgG+IgM)

## 2021-02-07 ENCOUNTER — Other Ambulatory Visit: Payer: Self-pay

## 2021-02-07 ENCOUNTER — Other Ambulatory Visit (HOSPITAL_COMMUNITY): Payer: Self-pay

## 2021-02-07 ENCOUNTER — Ambulatory Visit (INDEPENDENT_AMBULATORY_CARE_PROVIDER_SITE_OTHER): Payer: No Typology Code available for payment source | Admitting: Family Medicine

## 2021-02-07 ENCOUNTER — Encounter: Payer: Self-pay | Admitting: Family Medicine

## 2021-02-07 VITALS — BP 102/68 | HR 82 | Temp 98.3°F | Ht 66.0 in | Wt 146.0 lb

## 2021-02-07 DIAGNOSIS — G8929 Other chronic pain: Secondary | ICD-10-CM

## 2021-02-07 DIAGNOSIS — R202 Paresthesia of skin: Secondary | ICD-10-CM | POA: Diagnosis not present

## 2021-02-07 DIAGNOSIS — M542 Cervicalgia: Secondary | ICD-10-CM

## 2021-02-07 MED ORDER — CYCLOBENZAPRINE HCL 5 MG PO TABS
5.0000 mg | ORAL_TABLET | Freq: Three times a day (TID) | ORAL | 1 refills | Status: DC | PRN
Start: 2021-02-07 — End: 2021-09-21
  Filled 2021-02-07: qty 30, 10d supply, fill #0

## 2021-02-07 MED ORDER — PREDNISONE 20 MG PO TABS
ORAL_TABLET | ORAL | 0 refills | Status: AC
  Filled 2021-02-07: qty 15, 8d supply, fill #0

## 2021-02-07 MED ORDER — JUNEL 1.5/30 1.5-30 MG-MCG PO TABS: 1.0000 | ORAL_TABLET | Freq: Every day | ORAL | Status: DC

## 2021-02-07 NOTE — Progress Notes (Signed)
This visit occurred during the SARS-CoV-2 public health emergency.  Safety protocols were in place, including screening questions prior to the visit, additional usage of staff PPE, and extensive cleaning of exam room while observing appropriate contact time as indicated for disinfecting solutions.  Neck pain.  Started about 1 year ago.  Episodic pain.  Prev prednisone use.  Then had seen Allayne Gitelman here at clinic.  Then PT eval, still stretching.  Getting a massage monthly.  Flexeril helped some.  Worse in the last month.  L fingertips tingling/paresthesia.  No R handed sx.  Is R handed.  No L leg tingling.  No weakness. She can wake up with L arm sx.     Prednisone helps some, temporarily.  No ADE on med.    Meds, vitals, and allergies reviewed.   ROS: Per HPI unless specifically indicated in ROS section   GEN: nad, alert and oriented HEENT: ncat NECK: supple w/o LA CV: rrr.  PULM: ctab, no inc wob ABD: soft, +bs EXT: no edema SKIN: Well-perfused. CN 2-12 wnl B, S/S wnl x4

## 2021-02-07 NOTE — Patient Instructions (Addendum)
C spine xray.  Try a wrist brace at night.   Use flexeril if needed at night.   Prednisone with food if needed but stop nsaids at the time.  Take care.  Glad to see you.

## 2021-02-09 DIAGNOSIS — R202 Paresthesia of skin: Secondary | ICD-10-CM | POA: Insufficient documentation

## 2021-02-09 NOTE — Assessment & Plan Note (Signed)
Discussed options.  Normal grip and sensation at the time of exam We will arrange for C spine xray.  Reasonable to try a wrist brace at night.   Use flexeril if needed at night.   Reasonable to try prednisone with food if needed but stop nsaids at the time.  She agrees with plan.  She will update me as needed.  I will await her imaging.

## 2021-02-17 ENCOUNTER — Encounter: Payer: Self-pay | Admitting: Family Medicine

## 2021-02-18 ENCOUNTER — Ambulatory Visit
Admission: RE | Admit: 2021-02-18 | Discharge: 2021-02-18 | Disposition: A | Discharge status: Home / Self Care | Payer: No Typology Code available for payment source | Source: Ambulatory Visit | Attending: Family Medicine | Admitting: Family Medicine

## 2021-02-18 ENCOUNTER — Other Ambulatory Visit: Payer: Self-pay

## 2021-02-18 DIAGNOSIS — R202 Paresthesia of skin: Secondary | ICD-10-CM

## 2021-02-28 ENCOUNTER — Other Ambulatory Visit (HOSPITAL_COMMUNITY): Payer: Self-pay

## 2021-02-28 MED ORDER — NORETHINDRONE ACET-ETHINYL EST 1.5-30 MG-MCG PO TABS
1.0000 | ORAL_TABLET | Freq: Every day | ORAL | 3 refills | Status: DC
  Filled 2021-02-28: qty 63, 63d supply, fill #0
  Filled 2021-02-28: qty 21, 21d supply, fill #0
  Filled 2021-04-01: qty 63, 84d supply, fill #0
  Filled 2021-06-27: qty 63, 63d supply, fill #1
  Filled 2021-09-19: qty 63, 63d supply, fill #2

## 2021-03-02 ENCOUNTER — Other Ambulatory Visit (HOSPITAL_COMMUNITY): Payer: Self-pay

## 2021-03-02 MED FILL — Fluoxetine HCl Cap 40 MG: ORAL | Qty: 90 | Fill #0 | Status: CN

## 2021-03-02 MED FILL — Topiramate Cap ER 24HR Sprinkle 50 MG: ORAL | 30 days supply | Qty: 30 | Fill #4 | Status: CN

## 2021-03-03 ENCOUNTER — Other Ambulatory Visit (HOSPITAL_COMMUNITY): Payer: Self-pay

## 2021-03-03 MED FILL — Fluoxetine HCl Cap 40 MG: ORAL | 30 days supply | Qty: 30 | Fill #0 | Status: AC

## 2021-03-03 MED FILL — Topiramate Cap ER 24HR Sprinkle 50 MG: ORAL | 30 days supply | Qty: 30 | Fill #0 | Status: AC

## 2021-03-04 ENCOUNTER — Other Ambulatory Visit (HOSPITAL_COMMUNITY): Payer: Self-pay

## 2021-04-01 ENCOUNTER — Other Ambulatory Visit: Payer: Self-pay | Admitting: Neurology

## 2021-04-01 ENCOUNTER — Other Ambulatory Visit (HOSPITAL_COMMUNITY): Payer: Self-pay

## 2021-04-01 MED FILL — Fluoxetine HCl Cap 40 MG: ORAL | 30 days supply | Qty: 30 | Fill #1 | Status: AC

## 2021-04-05 ENCOUNTER — Other Ambulatory Visit (HOSPITAL_COMMUNITY): Payer: Self-pay

## 2021-04-05 MED ORDER — TOPIRAMATE ER 50 MG PO SPRINKLE CAP24
50.0000 mg | EXTENDED_RELEASE_CAPSULE | Freq: Every evening | ORAL | 6 refills | Status: DC
  Filled 2021-04-05: qty 30, 30d supply, fill #0
  Filled 2021-05-09: qty 30, 30d supply, fill #1

## 2021-04-22 ENCOUNTER — Encounter: Payer: Self-pay | Admitting: Family Medicine

## 2021-05-04 ENCOUNTER — Ambulatory Visit: Payer: BC Managed Care – PPO | Admitting: Neurology

## 2021-05-09 ENCOUNTER — Other Ambulatory Visit (HOSPITAL_COMMUNITY): Payer: Self-pay

## 2021-05-10 ENCOUNTER — Other Ambulatory Visit (HOSPITAL_COMMUNITY): Payer: Self-pay

## 2021-05-24 ENCOUNTER — Ambulatory Visit (INDEPENDENT_AMBULATORY_CARE_PROVIDER_SITE_OTHER): Payer: No Typology Code available for payment source | Admitting: Family Medicine

## 2021-05-24 ENCOUNTER — Other Ambulatory Visit (HOSPITAL_COMMUNITY): Payer: Self-pay

## 2021-05-24 ENCOUNTER — Encounter: Payer: Self-pay | Admitting: Family Medicine

## 2021-05-24 ENCOUNTER — Other Ambulatory Visit: Payer: Self-pay

## 2021-05-24 VITALS — BP 112/78 | HR 86 | Temp 98.4°F | Ht 66.0 in | Wt 144.0 lb

## 2021-05-24 DIAGNOSIS — Z0000 Encounter for general adult medical examination without abnormal findings: Secondary | ICD-10-CM

## 2021-05-24 DIAGNOSIS — G90A Postural orthostatic tachycardia syndrome (POTS): Secondary | ICD-10-CM

## 2021-05-24 DIAGNOSIS — Z029 Encounter for administrative examinations, unspecified: Secondary | ICD-10-CM | POA: Diagnosis not present

## 2021-05-24 DIAGNOSIS — Z7189 Other specified counseling: Secondary | ICD-10-CM

## 2021-05-24 DIAGNOSIS — G43009 Migraine without aura, not intractable, without status migrainosus: Secondary | ICD-10-CM

## 2021-05-24 DIAGNOSIS — G43709 Chronic migraine without aura, not intractable, without status migrainosus: Secondary | ICD-10-CM | POA: Diagnosis not present

## 2021-05-24 DIAGNOSIS — F4323 Adjustment disorder with mixed anxiety and depressed mood: Secondary | ICD-10-CM

## 2021-05-24 DIAGNOSIS — F5000 Anorexia nervosa, unspecified: Secondary | ICD-10-CM

## 2021-05-24 MED ORDER — FLUOXETINE HCL 40 MG PO CAPS
40.0000 mg | ORAL_CAPSULE | Freq: Every day | ORAL | 3 refills | Status: DC
Start: 2021-05-24 — End: 2022-06-13
  Filled 2021-05-24 – 2021-06-27 (×2): qty 90, 90d supply, fill #0
  Filled 2021-12-02: qty 90, 90d supply, fill #1
  Filled 2022-05-01: qty 90, 90d supply, fill #2

## 2021-05-24 NOTE — Progress Notes (Signed)
This visit occurred during the SARS-CoV-2 public health emergency.  Safety protocols were in place, including screening questions prior to the visit, additional usage of staff PPE, and extensive cleaning of exam room while observing appropriate contact time as indicated for disinfecting solutions.  CPE- See plan.  Routine anticipatory guidance given to patient.  See health maintenance.  The possibility exists that previously documented standard health maintenance information may have been brought forward from a previous encounter into this note.  If needed, that same information has been updated to reflect the current situation based on today's encounter.    Tetanus 2016 Flu done at work.  PNA and shingles not due.  covid vaccine 2021 Mammogram/DXA not due  Pap smear per gyn 2021 Advanced directive discussed with patient.  She would have her husband designated if she were incapacitated.  Neuro f/u pending re: migraines, I'll defer, she agrees.    POTS.  She was able to wean off atenolol and florinef.  She is doing well in the meantime, not lightheaded.  No syncope.    Mood is good.  She is in grad school for NP, 4 semesters left.  Husband is opening a coffee shop on [address] ([employer]).  Mood is still good.  She is going to cut back to 0.75 FTE at work.    See notes/forms for school.  See notes on labs regarding vaccine titers and TB test.  PMH and SH reviewed  Meds, vitals, and allergies reviewed.   ROS: Per HPI.  Unless specifically indicated otherwise in HPI, the patient denies:  General: fever. Eyes: acute vision changes ENT: sore throat Cardiovascular: chest pain Respiratory: SOB GI: vomiting GU: dysuria Musculoskeletal: acute back pain Derm: acute rash Neuro: acute motor dysfunction Psych: worsening mood Endocrine: polydipsia Heme: bleeding Allergy: hayfever  GEN: nad, alert and oriented HEENT: mucous membranes moist NECK: supple w/o LA CV: rrr. PULM: ctab,  no inc wob ABD: soft, +bs EXT: no edema SKIN: no acute rash See scanned forms.

## 2021-05-24 NOTE — Patient Instructions (Signed)
I'll work on your forms.  Good luck with school.  Go to the lab on the way out.   If you have mychart we'll likely use that to update you.    Take care.  Glad to see you.

## 2021-05-25 ENCOUNTER — Encounter: Payer: Self-pay | Admitting: Family Medicine

## 2021-05-25 LAB — MEASLES/MUMPS/RUBELLA IMMUNITY
Mumps IgG: 61.9 AU/mL
Rubella: 2.42 Index
Rubeola IgG: >300 AU/mL

## 2021-05-25 LAB — CBC WITH DIFFERENTIAL/PLATELET
Basophils Absolute: 0 10*3/uL (ref 0.0–0.1)
Basophils Relative: 0.7 % (ref 0.0–3.0)
Eosinophils Absolute: 0.1 10*3/uL (ref 0.0–0.7)
Eosinophils Relative: 1.5 % (ref 0.0–5.0)
HCT: 43.2 % (ref 36.0–46.0)
Hemoglobin: 14.3 g/dL (ref 12.0–15.0)
Lymphocytes Relative: 34.4 % (ref 12.0–46.0)
Lymphs Abs: 2.3 10*3/uL (ref 0.7–4.0)
MCHC: 33 g/dL (ref 30.0–36.0)
MCV: 90.4 fl (ref 78.0–100.0)
Monocytes Absolute: 0.5 10*3/uL (ref 0.1–1.0)
Monocytes Relative: 7.1 % (ref 3.0–12.0)
Neutro Abs: 3.7 10*3/uL (ref 1.4–7.7)
Neutrophils Relative %: 56.3 % (ref 43.0–77.0)
Platelets: 310 10*3/uL (ref 150.0–400.0)
RBC: 4.78 Mil/uL (ref 3.87–5.11)
RDW: 12.3 % (ref 11.5–15.5)
WBC: 6.6 10*3/uL (ref 4.0–10.5)

## 2021-05-25 LAB — BASIC METABOLIC PANEL
BUN: 9 mg/dL (ref 6–23)
CO2: 24 mEq/L (ref 19–32)
Calcium: 9.4 mg/dL (ref 8.4–10.5)
Chloride: 106 mEq/L (ref 96–112)
Creatinine, Ser: 0.84 mg/dL (ref 0.40–1.20)
GFR: 96.22 mL/min (ref >60.00)
Glucose, Bld: 82 mg/dL (ref 70–99)
Potassium: 4.1 mEq/L (ref 3.5–5.1)
Sodium: 140 mEq/L (ref 135–145)

## 2021-05-25 LAB — HEPATITIS B SURFACE ANTIBODY, QUANTITATIVE: Hep B S AB Quant (Post): 8 m[IU]/mL — ABNORMAL LOW (ref >=10)

## 2021-05-25 LAB — VARICELLA ZOSTER ANTIBODY, IGG: Varicella IgG: 804.8 index

## 2021-05-26 LAB — QUANTIFERON-TB GOLD PLUS
Mitogen-NIL: >10 IU/mL
NIL: 0.03 IU/mL
QuantiFERON-TB Gold Plus: NEGATIVE
TB1-NIL: 0 IU/mL
TB2-NIL: 0 IU/mL

## 2021-05-26 NOTE — Assessment & Plan Note (Signed)
She was eventually able to wean off atenolol and Florinef.  Discussed staying well-hydrated.  She is doing well in the meantime.  She will update me as needed.

## 2021-05-26 NOTE — Assessment & Plan Note (Signed)
Advanced directive discussed with patient.  She would have her husband designated if she were incapacitated. 

## 2021-05-26 NOTE — Assessment & Plan Note (Signed)
Per neurology.  I will defer. 

## 2021-05-26 NOTE — Assessment & Plan Note (Signed)
Tetanus 2016 Flu done at work.  PNA and shingles not due.  covid vaccine 2021 Mammogram/DXA not due  Pap smear per gyn 2021 Advanced directive discussed with patient.  She would have her husband designated if she were incapacitated.

## 2021-05-31 ENCOUNTER — Ambulatory Visit (INDEPENDENT_AMBULATORY_CARE_PROVIDER_SITE_OTHER): Payer: No Typology Code available for payment source

## 2021-05-31 ENCOUNTER — Other Ambulatory Visit: Payer: Self-pay

## 2021-05-31 DIAGNOSIS — Z23 Encounter for immunization: Secondary | ICD-10-CM | POA: Diagnosis not present

## 2021-05-31 NOTE — Progress Notes (Signed)
Patient presented for Hep B vaccine given by Wendie Simmer, CMA to left deltoid, patient voiced no concerns nor showed any signs of distress during injection.

## 2021-06-01 ENCOUNTER — Ambulatory Visit (INDEPENDENT_AMBULATORY_CARE_PROVIDER_SITE_OTHER): Payer: No Typology Code available for payment source | Admitting: Neurology

## 2021-06-01 ENCOUNTER — Encounter: Payer: Self-pay | Admitting: Neurology

## 2021-06-01 ENCOUNTER — Other Ambulatory Visit: Payer: Self-pay

## 2021-06-01 ENCOUNTER — Other Ambulatory Visit (HOSPITAL_COMMUNITY): Payer: Self-pay

## 2021-06-01 ENCOUNTER — Telehealth: Payer: Self-pay | Admitting: Neurology

## 2021-06-01 VITALS — BP 107/76 | HR 88 | Ht 66.0 in | Wt 146.0 lb

## 2021-06-01 DIAGNOSIS — G43709 Chronic migraine without aura, not intractable, without status migrainosus: Secondary | ICD-10-CM | POA: Diagnosis not present

## 2021-06-01 MED ORDER — AJOVY 225 MG/1.5ML ~~LOC~~ SOAJ
225.0000 mg | SUBCUTANEOUS | 11 refills | Status: DC
  Filled 2021-06-01: qty 1.5, 30d supply, fill #0

## 2021-06-01 NOTE — Progress Notes (Signed)
[facility]    Provider:  Dr Lucia Gaskins Requesting Provider: Joaquim Nam, MD Primary Care Provider:  Joaquim Nam, MD  CC:  Migraine  06/01/2021: Failed Emgality, tried Ajovy, Aimovig contraindicated. Started [employer] in April due to insurance not paying for Ajovy anymore and she is having daily migraines.  Aimovig is contraindicated due to constipation. Has tried multiple triptans and cause chest tightness, imitrex, maxalt, discussed nurtec** and will mychart me with results.  Over the last 6 months patient has been having daily migraines, they can last 4 to 12 hours, they can be unilateral or holocephalic, retro-orbital, pulsating pounding and throbbing, with nausea, photophobia and phonophobia, movement makes it worse, no aura, no medication overuse.  We discussed multiple options today, she is thinking about becoming pregnant, I did discuss that although no medications are safe I do believe that Botox is compatible with pregnancy and lactation, we discussed that it is a category X medication and what that means and the risks of using Botox during pregnancy.  01/19/2020: She is doing great on the Ajovy. Works [employer]. Only taken the maxalt twice since last seen.  Patient only had to use her Maxalt a few times since last being seen, and is worked well, we did discuss again Ajovy and risks in pregnancy do not get pregnant, we also discussed that she is on Topamax and that we can slowly further decrease it, we discussed decreasing it, I do not think she will have any problems if she goes down to 50 mg and hold there for several weeks to a month and if she wants to stop it at that time she can.  Also discussed that with migraines get worse go back to her last previous dose.  There can be risk for rebound seizures but I doubt it at this dosage, we did discuss that.  HPI:  Jessica [name] is a 25 y.o. female here as requested by Joaquim Nam, MD for migraines. PMHx POTs, Migraine.   They started in elementary school, she saw Dr. Sharene Skeans. They start in the left eye and travel down the left side of the face. She as been taking Topamax for years which did initially help, then on Qudexy. This year they have become a lot worse. She is also on Sumatriptan which she has to take multiple times. Headaches are pulsating, pounding, light and sound sensitivity, nausea, no aura, movement makes it worse, been to the ED due to severe headaches, affecting life. She has 27 headache days a month, 8 moderately severe migraine days a month and she is in bed 3 days a week she has severe migraine she has to go to bed, last several days. She has tried to examine her triggers. Discussed lifestyle. No other focal neurologic deficits, associated symptoms, inciting events or modifiable factors.  Reviewed notes, labs and imaging from outside physicians, which showed:  Tried: atenolol, topiramate IR and topiramate extended release, prozac, imitrex, maxalt, flexeril, amitriptyline and nortriptyline are contraindicated because she is currently on Prozac, Emgality, Ajovy, Aimovig is contraindicated due to constipation, prednisone, Zofran, Compazine, tizanidine,  CT head 2017 showed No acute intracranial abnormalities including mass lesion or mass effect, hydrocephalus, extra-axial fluid collection, midline shift, hemorrhage, or acute infarction, large ischemic events (personally reviewed images)  Review of Systems: Patient complains of symptoms per HPI as well as the following symptoms: headaches . Pertinent negatives and positives per HPI. All others negative     Social History   Socioeconomic History  Marital status: Married    Spouse name: Marcelino Scot   Number of children: 0   Years of education: Not on file   Highest education level: Bachelor's degree (e.g., BA, AB, BS)  Occupational History    Comment: RN [employer]  Tobacco Use   Smoking status: Never   Smokeless tobacco: Never  Vaping Use    Vaping Use: Never used  Substance and Sexual Activity   Alcohol use: Yes    Comment: occ   Drug use: No   Sexual activity: Never    Birth control/protection: Pill  Other Topics Concern   Not on file  Social History Narrative   RN at [employer]   She lives with husband    caffeine coffee 2 cups daily   Social Determinants of Health   Financial Resource Strain: Not on file  Food Insecurity: Not on file  Transportation Needs: Not on file  Physical Activity: Not on file  Stress: Not on file  Social Connections: Not on file  Intimate Partner Violence: Not on file    Family History  Problem Relation Age of Onset   Migraines Mother        Started 4th or 5th grade   Seizures Mother        Febrile Seizures as a child   Seizures Father        Febrile Seizures as a child   Hypertension Father    Kidney Stones Father    Migraines Maternal Aunt    Migraines Maternal Uncle    Diabetes Maternal Grandmother    Stroke Maternal Grandfather        mini-stroke   Cancer Maternal Grandfather        Bladder cancer, Died at 83   Diabetes Maternal Grandfather    Mental retardation Other        Maternal Second Cousin   Other Other        Maternal Great Uncle had some sort of Retinal Vessel Occlusion   Breast cancer Maternal Aunt    Cancer Maternal Aunt        Peritoneal CA   Eating disorder Maternal Aunt    Depression Other        Father's side of the family   Colon cancer Neg Hx     Past Medical History:  Diagnosis Date   Adjustment disorder with mixed anxiety and depressed mood    Anorexia nervosa    Autonomic dysfunction    Migraine headache    POTS (postural orthostatic tachycardia syndrome)    Splenomegaly    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   Syncope 09/22/2014   Vasovagal syncope     Patient Active Problem List   Diagnosis Date Noted   Chronic migraine without aura without status migrainosus, not intractable 07/21/2019   Routine general medical examination at a health care  facility 05/11/2019   Advance care planning 03/25/2018   POTS (postural orthostatic tachycardia syndrome) 05/07/2017   Adjustment disorder with mixed anxiety and depressed mood 07/13/2015   Anorexia nervosa 04/29/2015    Past Surgical History:  Procedure Laterality Date   ANTERIOR CRUCIATE LIGAMENT REPAIR  03/2010   EYE SURGERY  2000   clogged tear duct   WISDOM TOOTH EXTRACTION  2015    Current Outpatient Medications  Medication Sig Dispense Refill   cyclobenzaprine (FLEXERIL) 5 MG tablet Take 1 tablet (5 mg total) by mouth 3 (three) times daily as needed for muscle spasms. 30 tablet 1   FLUoxetine (PROZAC)  40 MG capsule Take 1 capsule (40 mg total) by mouth daily. 90 capsule 3   Norethindrone Acetate-Ethinyl Estradiol (JUNEL 1.5/30) 1.5-30 MG-MCG tablet Take 1 tablet by mouth daily. 63 tablet 3   ondansetron (ZOFRAN-ODT) 4 MG disintegrating tablet Take 1-2 tablets (4-8 mg total) by mouth every 8 (eight) hours as needed for nausea. 30 tablet 11   rizatriptan (MAXALT-MLT) 10 MG disintegrating tablet Take 1 tablet (10 mg total) by mouth as needed for migraine. May repeat in 2 hours if needed 9 tablet 11   topiramate ER (QUDEXY XR) 50 MG CS24 sprinkle capsule Take 1 capsule (50 mg total) by mouth at bedtime. 30 capsule 6   pantoprazole (PROTONIX) 40 MG tablet TAKE 1 TABLET (40 MG TOTAL) BY MOUTH DAILY. 90 tablet 1   No current facility-administered medications for this visit.    Allergies as of 06/01/2021 - Review Complete 06/01/2021  Allergen Reaction Noted   Amoxicillin Rash 06/19/2011   Doxycycline Other (See Comments) 12/24/2020    Vitals: BP 107/76   Pulse 88   Ht 5\' 6"  (1.676 m)   Wt 146 lb (66.2 kg)   BMI 23.57 kg/m  Last Weight:  Wt Readings from Last 1 Encounters:  06/01/21 146 lb (66.2 kg)   Last Height:   Ht Readings from Last 1 Encounters:  06/01/21 5\' 6"  (1.676 m)  Exam: NAD, pleasant                  Speech:    Speech is normal; fluent and spontaneous  with normal comprehension.  Cognition:    The patient is oriented to person, place, and time;     recent and remote memory intact;     language fluent;    Cranial Nerves:    The pupils are equal, round, and reactive to light.Trigeminal sensation is intact and the muscles of mastication are normal. The face is symmetric. The palate elevates in the midline. Hearing intact. Voice is normal. Shoulder shrug is normal. The tongue has normal motion without fasciculations.   Coordination:  No dysmetria  Motor Observation:    No asymmetry, no atrophy, and no involuntary movements noted. Tone:    Normal muscle tone.     Strength:    Strength is V/V in the upper and lower limbs.      Sensation: intact to LT     A/P: Patient with chronic migraines, no aura, no medication overuse.  She has failed multiple medications.  Today we discussed options and will start her on Botox for migraines. Discussed trying nurtec * in future.  Could also reconsider Ajovy*in the future Botox is not successful, we discussed that she will get benefit after the first injection but maximum benefit is after the third injection.  Tried: atenolol, topiramate IR and topiramate extended release, prozac, imitrex, maxalt, flexeril, amitriptyline and nortriptyline are contraindicated because she is currently on Prozac, Emgality, Ajovy, Aimovig is contraindicated due to constipation, prednisone, Zofran, Compazine, tizanidine,   Cc: Tonia Ghent, MD,  Tonia Ghent, MD  Sarina Ill, MD  [facility] [address] [place], [place] [address]  Phone [phone] Fax [phone]  I spent 30 minutes of face-to-face and non-face-to-face time with patient on the  1. Chronic migraine without aura without status migrainosus, not intractable    diagnosis.  This included previsit chart review, lab review, study review, order entry, electronic health record documentation, patient education on  the different diagnostic and therapeutic options, counseling and coordination  of care, risks and benefits of management, compliance, or risk factor reduction

## 2021-06-01 NOTE — Telephone Encounter (Signed)
Please start process for Botox approval for migraine protocol.  You can schedule patient with me since she is considering pregnancy so it would be best for physician to perform her Botox injections.  She could be added at the end of the day on a 4:00 spot or a 430 addition at the end of the day after she is approved.  Please call patient and explained process and the savings program.  She is not on CGRP, I have stopped her Emgality and we are not going to restart Ajovy or Aimovig.  Patient is not on a G pant or a CGRP injection.

## 2021-06-02 ENCOUNTER — Other Ambulatory Visit (HOSPITAL_COMMUNITY): Payer: Self-pay

## 2021-06-02 NOTE — Telephone Encounter (Signed)
Botox charge sheet completed, signed, and given to botox coordinator.

## 2021-06-07 NOTE — Telephone Encounter (Signed)
Received charge sheet for 200 units of Botox for G43.709. Patient currently has 2 insurances, [employer] [place] [employer]. I submitted PA request to [facility] [place] and it is currenty pending.   I called [employer] @ [phone] and spoke with Corrie Dandy to start a case for [id number] & [phone]. Case was started & I faxed clinicals to [phone] for review. Request is pending.

## 2021-06-08 NOTE — Telephone Encounter (Signed)
Scheduled patient follow up appt on 09/21/21 at 2pm with NP, Amy.

## 2021-06-08 NOTE — Telephone Encounter (Signed)
Received approval from [facility]. PA #[id number] (06/08/21- 06/07/22).   I called the patient to schedule an appointment. She states she is having second thoughts about getting Botox while actively trying to conceive. She states her gynecologist advised against it at this time. She would like to discuss this further with Dr. Lucia Gaskins before proceeding.

## 2021-06-27 ENCOUNTER — Other Ambulatory Visit (HOSPITAL_COMMUNITY): Payer: Self-pay

## 2021-06-29 ENCOUNTER — Encounter: Payer: Self-pay | Admitting: Family Medicine

## 2021-07-12 ENCOUNTER — Other Ambulatory Visit (HOSPITAL_COMMUNITY): Payer: Self-pay

## 2021-08-25 ENCOUNTER — Encounter: Payer: Self-pay | Admitting: Family Medicine

## 2021-08-25 DIAGNOSIS — Z111 Encounter for screening for respiratory tuberculosis: Secondary | ICD-10-CM | POA: Insufficient documentation

## 2021-09-11 ENCOUNTER — Ambulatory Visit
Admission: EM | Admit: 2021-09-11 | Discharge: 2021-09-11 | Disposition: A | Discharge status: Home / Self Care | Payer: No Typology Code available for payment source

## 2021-09-11 ENCOUNTER — Encounter: Payer: Self-pay | Admitting: Emergency Medicine

## 2021-09-11 ENCOUNTER — Other Ambulatory Visit: Payer: Self-pay

## 2021-09-11 DIAGNOSIS — J019 Acute sinusitis, unspecified: Secondary | ICD-10-CM

## 2021-09-11 MED ORDER — CEFDINIR 300 MG PO CAPS: 300.0000 mg | ORAL_CAPSULE | Freq: Two times a day (BID) | ORAL | 0 refills | Status: AC

## 2021-09-11 NOTE — ED Triage Notes (Signed)
Patient c/o possible sinus infection, cough, sinus pressure, congestion x 1 week.  Patient has taken Tylenol, Ibuprofen, Zicam and Sudafed.

## 2021-09-11 NOTE — ED Provider Notes (Signed)
[facility]    CSN: [id number] Arrival date & time: 09/11/21  0915      History   Chief Complaint Chief Complaint  Patient presents with   Possible Sinus Infection   Cough    HPI Jessica [name] is a 26 y.o. female.   Patient here today for evaluation of possible sinus infection. She reports she has had congestion and sinus pressure for the last week with worsening symptoms yesterday with onset of low grade fever (Tmax 100.6) and some cough. She has tried multiple OTC meds without significant relief. Heart rate is elevated today but patient has been taking sudafed  The history is provided by the patient.  Cough Associated symptoms: sore throat   Associated symptoms: no chills, no ear pain, no eye discharge, no fever, no shortness of breath and no wheezing    Past Medical History:  Diagnosis Date   Adjustment disorder with mixed anxiety and depressed mood    Anorexia nervosa    Autonomic dysfunction    Migraine headache    POTS (postural orthostatic tachycardia syndrome)    Splenomegaly    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   Syncope 09/22/2014   Vasovagal syncope     Patient Active Problem List   Diagnosis Date Noted   Screening-pulmonary TB 08/25/2021   Chronic migraine without aura without status migrainosus, not intractable 07/21/2019   Routine general medical examination at a health care facility 05/11/2019   Advance care planning 03/25/2018   POTS (postural orthostatic tachycardia syndrome) 05/07/2017   Adjustment disorder with mixed anxiety and depressed mood 07/13/2015   Anorexia nervosa 04/29/2015    Past Surgical History:  Procedure Laterality Date   ANTERIOR CRUCIATE LIGAMENT REPAIR  03/2010   EYE SURGERY  2000   clogged tear duct   WISDOM TOOTH EXTRACTION  2015    OB History   No obstetric history on file.      Home Medications    Prior to Admission medications   Medication Sig Start Date End Date Taking? Authorizing Provider  cefdinir (OMNICEF) 300  MG capsule Take 1 capsule (300 mg total) by mouth 2 (two) times daily for 7 days. 09/11/21 09/18/21 Yes Francene Finders, PA-C  FLUoxetine (PROZAC) 40 MG capsule Take 1 capsule (40 mg total) by mouth daily. 05/24/21  Yes Tonia Ghent, MD  Fremanezumab-vfrm (AJOVY [place]) Inject into the skin.   Yes [provider]  Norethindrone Acetate-Ethinyl Estradiol (JUNEL 1.5/30) 1.5-30 MG-MCG tablet Take 1 tablet by mouth daily. 02/28/21  Yes   ondansetron (ZOFRAN-ODT) 4 MG disintegrating tablet Take 1-2 tablets (4-8 mg total) by mouth every 8 (eight) hours as needed for nausea. 01/19/20  Yes Melvenia Beam, MD  rizatriptan (MAXALT-MLT) 10 MG disintegrating tablet Take 1 tablet (10 mg total) by mouth as needed for migraine. May repeat in 2 hours if needed 01/19/20  Yes Melvenia Beam, MD  cyclobenzaprine (FLEXERIL) 5 MG tablet Take 1 tablet (5 mg total) by mouth 3 (three) times daily as needed for muscle spasms. 02/07/21   Tonia Ghent, MD  pantoprazole (PROTONIX) 40 MG tablet TAKE 1 TABLET (40 MG TOTAL) BY MOUTH DAILY. 05/11/20 05/11/21  Tonia Ghent, MD  topiramate ER (QUDEXY XR) 50 MG CS24 sprinkle capsule Take 1 capsule (50 mg total) by mouth at bedtime. 04/05/21   Melvenia Beam, MD    Family History Family History  Problem Relation Age of Onset   Migraines Mother  Started 4th or 5th grade   Seizures Mother        Febrile Seizures as a child   Seizures Father        Febrile Seizures as a child   Hypertension Father    Kidney Stones Father    Migraines Maternal Aunt    Migraines Maternal Uncle    Diabetes Maternal Grandmother    Stroke Maternal Grandfather        mini-stroke   Cancer Maternal Grandfather        Bladder cancer, Died at 100   Diabetes Maternal Grandfather    Mental retardation Other        Maternal Second Cousin   Other Other        Maternal Great Uncle had some sort of Retinal Vessel Occlusion   Breast cancer Maternal Aunt    Cancer Maternal Aunt         Peritoneal CA   Eating disorder Maternal Aunt    Depression Other        Father's side of the family   Colon cancer Neg Hx     Social History Social History   Tobacco Use   Smoking status: Never   Smokeless tobacco: Never  Vaping Use   Vaping Use: Never used  Substance Use Topics   Alcohol use: Yes    Comment: occ   Drug use: No     Allergies   Amoxicillin and Doxycycline   Review of Systems Review of Systems  Constitutional:  Negative for chills and fever.  HENT:  Positive for congestion, sinus pressure and sore throat. Negative for ear pain.   Eyes:  Negative for discharge and redness.  Respiratory:  Positive for cough. Negative for shortness of breath and wheezing.   Gastrointestinal:  Negative for abdominal pain, diarrhea, nausea and vomiting.    Physical Exam Triage Vital Signs ED Triage Vitals [09/11/21 1031]  Enc Vitals Group     BP (!) 134/91     Pulse Rate (!) 122     Resp      Temp 98 F (36.7 C)     Temp Source Oral     SpO2 98 %     Weight 140 lb (63.5 kg)     Height 5\' 6"  (1.676 m)     Head Circumference      Peak Flow      Pain Score 5     Pain Loc      Pain Edu?      Excl. in [place]?    No data found.  Updated Vital Signs BP (!) 134/91 (BP Location: Left Arm)    Pulse (!) 122    Temp 98 F (36.7 C) (Oral)    Ht 5\' 6"  (1.676 m)    Wt 140 lb (63.5 kg)    LMP 08/16/2021 (Exact Date)    SpO2 98%    BMI 22.60 kg/m      Physical Exam Vitals and nursing note reviewed.  Constitutional:      General: She is not in acute distress.    Appearance: Normal appearance. She is not ill-appearing.  HENT:     Head: Normocephalic and atraumatic.     Nose: Congestion present.     Mouth/Throat:     Mouth: Mucous membranes are moist.     Pharynx: No oropharyngeal exudate or posterior oropharyngeal erythema.  Eyes:     Conjunctiva/sclera: Conjunctivae normal.  Cardiovascular:     Rate and Rhythm: Normal rate  and regular rhythm.     Heart sounds:  Normal heart sounds. No murmur heard. Pulmonary:     Effort: Pulmonary effort is normal. No respiratory distress.     Breath sounds: Normal breath sounds. No wheezing, rhonchi or rales.  Skin:    General: Skin is warm and dry.  Neurological:     Mental Status: She is alert.  Psychiatric:        Mood and Affect: Mood normal.        Thought Content: Thought content normal.     UC Treatments / Results  Labs (all labs ordered are listed, but only abnormal results are displayed) Labs Reviewed  NOVEL CORONAVIRUS, NAA    EKG   Radiology No results found.  Procedures Procedures (including critical care time)  Medications Ordered in UC Medications - No data to display  Initial Impression / Assessment and Plan / UC Course  I have reviewed the triage vital signs and the nursing notes.  Pertinent labs & imaging results that were available during my care of the patient were reviewed by me and considered in my medical decision making (see chart for details).    Will treat to cover sinusitis but will also screen for covid given patient works in healthcare and symptoms worsened with fever yesterday. Patient is agreeable with plan. Encourage follow up with any further concerns.   Final Clinical Impressions(s) / UC Diagnoses   Final diagnoses:  Acute sinusitis, recurrence not specified, unspecified location   Discharge Instructions   None    ED Prescriptions     Medication Sig Dispense Auth. Provider   cefdinir (OMNICEF) 300 MG capsule Take 1 capsule (300 mg total) by mouth 2 (two) times daily for 7 days. 14 capsule Francene Finders, PA-C      PDMP not reviewed this encounter.   Francene Finders, PA-C 09/11/21 1129

## 2021-09-12 LAB — NOVEL CORONAVIRUS, NAA: SARS-CoV-2, NAA: NOT DETECTED

## 2021-09-19 ENCOUNTER — Other Ambulatory Visit (HOSPITAL_COMMUNITY): Payer: Self-pay

## 2021-09-21 ENCOUNTER — Encounter: Payer: Self-pay | Admitting: Family Medicine

## 2021-09-21 ENCOUNTER — Ambulatory Visit: Payer: No Typology Code available for payment source | Admitting: Family Medicine

## 2021-09-21 VITALS — BP 120/84 | HR 95 | Ht 66.0 in | Wt 146.0 lb

## 2021-09-21 DIAGNOSIS — G43709 Chronic migraine without aura, not intractable, without status migrainosus: Secondary | ICD-10-CM | POA: Diagnosis not present

## 2021-09-21 MED ORDER — FREMANEZUMAB-VFRM 225 MG/1.5ML ~~LOC~~ SOAJ: 1.5000 mL | SUBCUTANEOUS | 11 refills | Status: DC

## 2021-09-21 MED ORDER — RIMEGEPANT SULFATE 75 MG PO TBDP: 75.0000 mg | ORAL_TABLET | ORAL | 11 refills | Status: DC | PRN

## 2021-09-21 NOTE — Progress Notes (Signed)
Chief Complaint  Patient presents with   Follow-up    Rm 2 alone here for f/u on migraines- pt reports she has been doing ok since last visit. Would like to discuss med options. At last visit was given sample of Ajovy last injection 09/07/21     HISTORY OF PRESENT ILLNESS:  09/22/21 ALL:  Jessica [name] is a 26 y.o. female here today for follow up for  migraines. During her last visit she was planning to pursue Botox injections but she plans to get pregnant in the near future. At baseline she has headaches almost daily, with 20 of them being migrainous. She was provided Ajovy and Nurtec samples during her last visit and has had >50% reduction in total headaches. She has previously failed Emgality. She has been using Maxalt for abortive therapy but reports this gives her chest tightness. She reports that the Nurtec samples have worked well for abortive therapy.  HISTORY (copied from previous note) AA 06/01/2021: Failed Emgality, tried Ajovy, Aimovig contraindicated. Started [employer] in April due to insurance not paying for Ajovy anymore and she is having daily migraines.  Aimovig is contraindicated due to constipation. Has tried multiple triptans and cause chest tightness, imitrex, maxalt, discussed nurtec** and will mychart me with results.  Over the last 6 months patient has been having daily migraines, they can last 4 to 12 hours, they can be unilateral or holocephalic, retro-orbital, pulsating pounding and throbbing, with nausea, photophobia and phonophobia, movement makes it worse, no aura, no medication overuse.  We discussed multiple options today, she is thinking about becoming pregnant, I did discuss that although no medications are safe I do believe that Botox is compatible with pregnancy and lactation, we discussed that it is a category X medication and what that means and the risks of using Botox during pregnancy.   AA 01/19/2020: She is doing great on the Ajovy. Works [employer]. Only taken  the maxalt twice since last seen.  Patient only had to use her Maxalt a few times since last being seen, and is worked well, we did discuss again Ajovy and risks in pregnancy do not get pregnant, we also discussed that she is on Topamax and that we can slowly further decrease it, we discussed decreasing it, I do not think she will have any problems if she goes down to 50 mg and hold there for several weeks to a month and if she wants to stop it at that time she can.  Also discussed that with migraines get worse go back to her last previous dose.  There can be risk for rebound seizures but I doubt it at this dosage, we did discuss that.  REVIEW OF SYSTEMS: Out of a complete 14 system review of symptoms, the patient complains only of the following symptoms, Headaches and all other reviewed systems are negative.   ALLERGIES: Allergies  Allergen Reactions   Amoxicillin Rash   Doxycycline Other (See Comments)    Possible cause of rash   Maxalt [Rizatriptan]     Chest Tightness     HOME MEDICATIONS: Outpatient Medications Prior to Visit  Medication Sig Dispense Refill   FLUoxetine (PROZAC) 40 MG capsule Take 1 capsule (40 mg total) by mouth daily. 90 capsule 3   Norethindrone Acetate-Ethinyl Estradiol (JUNEL 1.5/30) 1.5-30 MG-MCG tablet Take 1 tablet by mouth daily. 63 tablet 3   ondansetron (ZOFRAN-ODT) 4 MG disintegrating tablet Take 1-2 tablets (4-8 mg total) by mouth every 8 (eight) hours as needed  for nausea. 30 tablet 11   Fremanezumab-vfrm (AJOVY [place]) Inject into the skin.     rizatriptan (MAXALT-MLT) 10 MG disintegrating tablet Take 1 tablet (10 mg total) by mouth as needed for migraine. May repeat in 2 hours if needed 9 tablet 11   pantoprazole (PROTONIX) 40 MG tablet TAKE 1 TABLET (40 MG TOTAL) BY MOUTH DAILY. 90 tablet 1   cyclobenzaprine (FLEXERIL) 5 MG tablet Take 1 tablet (5 mg total) by mouth 3 (three) times daily as needed for muscle spasms. 30 tablet 1   topiramate ER (QUDEXY XR)  50 MG CS24 sprinkle capsule Take 1 capsule (50 mg total) by mouth at bedtime. 30 capsule 6   No facility-administered medications prior to visit.     PAST MEDICAL HISTORY: Past Medical History:  Diagnosis Date   Adjustment disorder with mixed anxiety and depressed mood    Anorexia nervosa    Autonomic dysfunction    Migraine headache    POTS (postural orthostatic tachycardia syndrome)    Splenomegaly    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   Syncope 09/22/2014   Vasovagal syncope      PAST SURGICAL HISTORY: Past Surgical History:  Procedure Laterality Date   ANTERIOR CRUCIATE LIGAMENT REPAIR  03/2010   EYE SURGERY  2000   clogged tear duct   WISDOM TOOTH EXTRACTION  2015     FAMILY HISTORY: Family History  Problem Relation Age of Onset   Migraines Mother        Started 26th or 5th grade   Seizures Mother        Febrile Seizures as a child   Seizures Father        Febrile Seizures as a child   Hypertension Father    Kidney Stones Father    Migraines Maternal Aunt    Migraines Maternal Uncle    Diabetes Maternal Grandmother    Stroke Maternal Grandfather        mini-stroke   Cancer Maternal Grandfather        Bladder cancer, Died at 65   Diabetes Maternal Grandfather    Mental retardation Other        Maternal Second Cousin   Other Other        Maternal Great Uncle had some sort of Retinal Vessel Occlusion   Breast cancer Maternal Aunt    Cancer Maternal Aunt        Peritoneal CA   Eating disorder Maternal Aunt    Depression Other        Father's side of the family   Colon cancer Neg Hx      SOCIAL HISTORY: Social History   Socioeconomic History   Marital status: Married    Spouse name: Marcelino Scot   Number of children: 0   Years of education: Not on file   Highest education level: Bachelor's degree (e.g., BA, AB, BS)  Occupational History    Comment: RN [employer]  Tobacco Use   Smoking status: Never   Smokeless tobacco: Never  Vaping Use   Vaping Use: Never used   Substance and Sexual Activity   Alcohol use: Yes    Comment: occ   Drug use: No   Sexual activity: Never    Birth control/protection: Pill  Other Topics Concern   Not on file  Social History Narrative   RN at [employer]   She lives with husband    caffeine coffee 2 cups daily   Social Determinants of Health   Financial  Resource Strain: Not on file  Food Insecurity: Not on file  Transportation Needs: Not on file  Physical Activity: Not on file  Stress: Not on file  Social Connections: Not on file  Intimate Partner Violence: Not on file     PHYSICAL EXAM  Vitals:   09/21/21 1402  BP: 120/84  Pulse: 95  Weight: 146 lb (66.2 kg)  Height: 5\' 6"  (1.676 m)   Body mass index is 23.57 kg/m.  Generalized: Well developed, in no acute distress  Cardiology: normal rate and rhythm, no murmur auscultated  Respiratory: clear to auscultation bilaterally    Neurological examination  Mentation: Alert oriented to time, place, history taking. Follows all commands speech and language fluent Cranial nerve II-XII: Pupils were equal round reactive to light. Extraocular movements were full, visual field were full on confrontational test. Facial sensation and strength were normal. Uvula tongue midline. Head turning and shoulder shrug  were normal and symmetric. Motor: The motor testing reveals 5 over 5 strength of all 4 extremities. Good symmetric motor tone is noted throughout.  Sensory: Sensory testing is intact to soft touch on all 4 extremities. No evidence of extinction is noted.  Coordination: Cerebellar testing reveals good finger-nose-finger and heel-to-shin bilaterally.  Gait and station: Gait is normal. Tandem gait is normal. Romberg is negative. No drift is seen.  Reflexes: Deep tendon reflexes are symmetric and normal bilaterally.    DIAGNOSTIC DATA (LABS, IMAGING, TESTING) - I reviewed patient records, labs, notes, testing and imaging myself where available.  Lab  Results  Component Value Date   WBC 6.6 05/24/2021   HGB 14.3 05/24/2021   HCT 43.2 05/24/2021   MCV 90.4 05/24/2021   PLT 310.0 05/24/2021      Component Value Date/Time   NA 140 05/24/2021 1647   NA 141 11/30/2016 1657   K 4.1 05/24/2021 1647   CL 106 05/24/2021 1647   CO2 24 05/24/2021 1647   GLUCOSE 82 05/24/2021 1647   BUN 9 05/24/2021 1647   BUN 13 11/30/2016 1657   CREATININE 0.84 05/24/2021 1647   CREATININE 0.82 07/20/2016 1455   CALCIUM 9.4 05/24/2021 1647   PROT 7.1 05/06/2019 0841   PROT 7.2 11/30/2016 1657   ALBUMIN 4.3 05/06/2019 0841   ALBUMIN 4.6 11/30/2016 1657   AST 18 05/06/2019 0841   ALT 27 05/06/2019 0841   ALKPHOS 51 05/06/2019 0841   BILITOT 0.4 05/06/2019 0841   BILITOT 0.2 11/30/2016 1657   GFRNONAA >60 02/05/2019 1604   GFRAA >60 02/05/2019 1604   Lab Results  Component Value Date   CHOL 187 (H) 04/29/2015   HDL 38 04/29/2015   LDLCALC 126 (H) 04/29/2015   TRIG 116 04/29/2015   CHOLHDL 4.9 04/29/2015   Lab Results  Component Value Date   HGBA1C 5.4 04/29/2015   No results found for: [id number] Lab Results  Component Value Date   TSH 1.77 05/06/2020    No flowsheet data found.   No flowsheet data found.   ASSESSMENT AND PLAN  26 y.o. year old female  has a past medical history of Adjustment disorder with mixed anxiety and depressed mood, Anorexia nervosa, Autonomic dysfunction, Migraine headache, POTS (postural orthostatic tachycardia syndrome), Splenomegaly, Syncope (09/22/2014), and Vasovagal syncope. here with    Chronic migraine without aura without status migrainosus, not intractable  Jessica [name] has tolerated the Ajovy and Nurtec and has had great symptom management. We will continue the Ajovy injections monthly and Nurtec as needed for abortive therapy. Discussed recommendations  of discontinuing Ajovy 6 months prior to desired planned pregnancy. We discussed her letting us know if she were to become pregnant as we will need to  switch her medications. She will follow up with me in 1 year or sooner if needed.  No orders of the defined types were placed in this encounter.   Meds ordered this encounter  Medications   Fremanezumab-vfrm 225 MG/1.5ML SOAJ    Sig: Inject 1.5 mLs into the skin every 30 (thirty) days.    Dispense:  1.5 mL    Refill:  11   Rimegepant Sulfate 75 MG TBDP    Sig: Take 75 mg by mouth as needed (at the onset of headache).    Dispense:  8 tablet    Refill:  11     Jessica Dara, MSN, FNP-C 09/22/2021, 8:34 AM  [facility] [address] [place], [place] [address] [phone]

## 2021-09-21 NOTE — Patient Instructions (Addendum)
Below is our plan: ? ?We will continue with the Ajovy monthly injections and Nurtec as needed for preventative. Take 1 Nurtec tablet at the onset of a headache, do not exceed 8 tablets in a 1 month period.  Please let us know if you're actively planning to get pregnant. The pharmacy recommendation for Ajovy is to discontinue 6 months prior to conception. Please let us know if you become pregnant as we will need to discontinue the above medications.  ? ?Please make sure you are staying well hydrated. I recommend 50-60 ounces daily. Well balanced diet and regular exercise encouraged. Consistent sleep schedule with 6-8 hours recommended.  ? ?Please continue follow up with care team as directed.  ? ?Follow up with me in 1 year ? ?You may receive a survey regarding today's visit. I encourage you to leave honest feed back as I do use this information to improve patient care. Thank you for seeing me today!  ?  ?

## 2021-09-22 ENCOUNTER — Encounter: Payer: Self-pay | Admitting: Family Medicine

## 2021-09-27 ENCOUNTER — Telehealth: Payer: Self-pay | Admitting: *Deleted

## 2021-09-27 NOTE — Telephone Encounter (Signed)
The request has been approved. The authorization is effective for a maximum of 6 fills from 09/27/2021 to 03/29/2022, as long as the member is enrolled in their current health plan. The request was approved with a quantity restriction. This has been approved for a max daily dosage of 0. A written notification letter will follow with additional details. ?

## 2021-09-27 NOTE — Telephone Encounter (Signed)
Submitted PA Nurtec on CMM. Key: [id number]. Waiting on determination from [employer]. ?

## 2021-10-04 ENCOUNTER — Encounter: Payer: Self-pay | Admitting: Family Medicine

## 2021-10-04 ENCOUNTER — Other Ambulatory Visit (HOSPITAL_COMMUNITY): Payer: Self-pay

## 2021-10-04 ENCOUNTER — Other Ambulatory Visit: Payer: Self-pay | Admitting: *Deleted

## 2021-10-04 DIAGNOSIS — G43709 Chronic migraine without aura, not intractable, without status migrainosus: Secondary | ICD-10-CM

## 2021-10-04 MED ORDER — RIMEGEPANT SULFATE 75 MG PO TBDP
75.0000 mg | ORAL_TABLET | ORAL | 11 refills | Status: DC | PRN
  Filled 2021-10-04 – 2021-10-07 (×2): qty 8, 30d supply, fill #0
  Filled 2022-01-12: qty 8, 30d supply, fill #1
  Filled 2022-05-01: qty 8, 30d supply, fill #2
  Filled 2022-07-05: qty 8, 30d supply, fill #0
  Filled 2022-07-05: qty 8, 30d supply, fill #3
  Filled 2022-09-08: qty 8, 30d supply, fill #1

## 2021-10-04 MED ORDER — FREMANEZUMAB-VFRM 225 MG/1.5ML ~~LOC~~ SOAJ
225.0000 mg | SUBCUTANEOUS | 11 refills | Status: DC
  Filled 2021-10-04 – 2021-10-07 (×2): qty 1.5, 30d supply, fill #0

## 2021-10-04 NOTE — Telephone Encounter (Signed)
Received PA request on CMM today for Ajovy. Key: [id number]. Submitted, waiting on determination from [employer]. ?

## 2021-10-04 NOTE — Telephone Encounter (Signed)
Received fax from [employer] that PA approved 10/04/21-04/05/22. PA ref# [id number].  ?

## 2021-10-07 ENCOUNTER — Other Ambulatory Visit (HOSPITAL_COMMUNITY): Payer: Self-pay

## 2021-11-18 ENCOUNTER — Other Ambulatory Visit: Payer: Self-pay

## 2021-11-18 ENCOUNTER — Ambulatory Visit
Admission: RE | Admit: 2021-11-18 | Discharge: 2021-11-18 | Disposition: A | Discharge status: Home / Self Care | Payer: No Typology Code available for payment source | Source: Ambulatory Visit | Attending: Family Medicine | Admitting: Family Medicine

## 2021-11-18 VITALS — BP 121/84 | HR 92 | Temp 99.0°F | Resp 16

## 2021-11-18 DIAGNOSIS — M436 Torticollis: Secondary | ICD-10-CM | POA: Diagnosis not present

## 2021-11-18 DIAGNOSIS — M5412 Radiculopathy, cervical region: Secondary | ICD-10-CM | POA: Diagnosis not present

## 2021-11-18 MED ORDER — PREDNISONE 20 MG PO TABS
20.0000 mg | ORAL_TABLET | Freq: Every day | ORAL | 0 refills | Status: AC
  Filled 2021-11-18 (×3): qty 5, 5d supply, fill #0

## 2021-11-18 MED ORDER — DEXAMETHASONE SODIUM PHOSPHATE 10 MG/ML IJ SOLN
10.0000 mg | Freq: Once | INTRAMUSCULAR | Status: AC
  Administered 2021-11-18: 10 mg via INTRAMUSCULAR

## 2021-11-18 MED ORDER — MELOXICAM 15 MG PO TABS: 15.0000 mg | ORAL_TABLET | Freq: Every day | ORAL | 0 refills | Status: DC | PRN

## 2021-11-18 MED ORDER — CYCLOBENZAPRINE HCL 10 MG PO TABS
10.0000 mg | ORAL_TABLET | Freq: Two times a day (BID) | ORAL | 0 refills | Status: DC | PRN
  Filled 2021-11-18 (×5): qty 20, 10d supply, fill #0

## 2021-11-18 MED ORDER — PREDNISONE 20 MG PO TABS: 20.0000 mg | ORAL_TABLET | Freq: Every day | ORAL | 0 refills | Status: DC

## 2021-11-18 MED ORDER — MELOXICAM 15 MG PO TABS
15.0000 mg | ORAL_TABLET | Freq: Every day | ORAL | 0 refills | Status: DC | PRN
  Filled 2021-11-18: qty 30, 30d supply, fill #0

## 2021-11-18 MED ORDER — CYCLOBENZAPRINE HCL 10 MG PO TABS: 10.0000 mg | ORAL_TABLET | Freq: Two times a day (BID) | ORAL | 0 refills | Status: DC | PRN

## 2021-11-18 NOTE — ED Provider Notes (Signed)
[facility]    CSN: [id number] Arrival date & time: 11/18/21  1332      History   Chief Complaint Chief Complaint  Patient presents with   Torticollis    HPI Jessica [name] is a 26 y.o. female.   HPI Patient presents for evaluation of neck pain. Patient reports a current neck pain developed this morning upon awakening. She currently has limited ROM. She has experienced problems with  her neck previously which cleared with a round of prednisone. Denies any mechanism of injury. She turned her head and subsequently felt sensation of pop with acute onset of pain with left lateral turns and flexion of neck. No fever or concerning focal symptoms. Past Medical History:  Diagnosis Date   Adjustment disorder with mixed anxiety and depressed mood    Anorexia nervosa    Autonomic dysfunction    Migraine headache    POTS (postural orthostatic tachycardia syndrome)    Splenomegaly    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   Syncope 09/22/2014   Vasovagal syncope     Patient Active Problem List   Diagnosis Date Noted   Screening-pulmonary TB 08/25/2021   Chronic migraine without aura without status migrainosus, not intractable 07/21/2019   Routine general medical examination at a health care facility 05/11/2019   Advance care planning 03/25/2018   POTS (postural orthostatic tachycardia syndrome) 05/07/2017   Adjustment disorder with mixed anxiety and depressed mood 07/13/2015   Anorexia nervosa 04/29/2015    Past Surgical History:  Procedure Laterality Date   ANTERIOR CRUCIATE LIGAMENT REPAIR  03/2010   EYE SURGERY  2000   clogged tear duct   WISDOM TOOTH EXTRACTION  2015    OB History   No obstetric history on file.      Home Medications    Prior to Admission medications   Medication Sig Start Date End Date Taking? Authorizing Provider  cyclobenzaprine (FLEXERIL) 10 MG tablet Take 1 tablet (10 mg total) by mouth 2 (two) times daily as needed for muscle spasms. 11/18/21   Bing Neighbors, FNP  FLUoxetine (PROZAC) 40 MG capsule Take 1 capsule (40 mg total) by mouth daily. 05/24/21   Joaquim Nam, MD  Fremanezumab-vfrm 225 MG/1.5ML SOAJ Inject 225 mg into the skin every 30 (thirty) days. 10/04/21   Lomax, Amy, NP  meloxicam (MOBIC) 15 MG tablet Take 1 tablet (15 mg total) by mouth daily as needed for pain. 11/18/21   Bing Neighbors, FNP  Norethindrone Acetate-Ethinyl Estradiol (JUNEL 1.5/30) 1.5-30 MG-MCG tablet Take 1 tablet by mouth daily. 02/28/21     ondansetron (ZOFRAN-ODT) 4 MG disintegrating tablet Take 1-2 tablets (4-8 mg total) by mouth every 8 (eight) hours as needed for nausea. 01/19/20   Anson Fret, MD  pantoprazole (PROTONIX) 40 MG tablet TAKE 1 TABLET (40 MG TOTAL) BY MOUTH DAILY. 05/11/20 05/11/21  Joaquim Nam, MD  predniSONE (DELTASONE) 20 MG tablet Take 1 tablet (20 mg total) by mouth daily with breakfast for 5 days. 11/18/21 11/23/21  Bing Neighbors, FNP  Rimegepant Sulfate 75 MG TBDP Take 1 tablet (75 mg total) by mouth as needed (at the onset of headache). 10/04/21   Lomax, Amy, NP    Family History Family History  Problem Relation Age of Onset   Migraines Mother        Started 4th or 5th grade   Seizures Mother        Febrile Seizures as a child   Seizures Father  Febrile Seizures as a child   Hypertension Father    Kidney Stones Father    Migraines Maternal Aunt    Migraines Maternal Uncle    Diabetes Maternal Grandmother    Stroke Maternal Grandfather        mini-stroke   Cancer Maternal Grandfather        Bladder cancer, Died at 38   Diabetes Maternal Grandfather    Mental retardation Other        Maternal Second Cousin   Other Other        Maternal Great Uncle had some sort of Retinal Vessel Occlusion   Breast cancer Maternal Aunt    Cancer Maternal Aunt        Peritoneal CA   Eating disorder Maternal Aunt    Depression Other        Father's side of the family   Colon cancer Neg Hx     Social  History Social History   Tobacco Use   Smoking status: Never   Smokeless tobacco: Never  Vaping Use   Vaping Use: Never used  Substance Use Topics   Alcohol use: Yes    Comment: occ   Drug use: No     Allergies   Amoxicillin, Doxycycline, and Maxalt [rizatriptan]   Review of Systems Review of Systems Pertinent negatives listed in HPI   Physical Exam Triage Vital Signs ED Triage Vitals [11/18/21 1352]  Enc Vitals Group     BP 121/84     Pulse Rate 92     Resp 16     Temp 99 F (37.2 C)     Temp Source Oral     SpO2 98 %     Weight      Height      Head Circumference      Peak Flow      Pain Score 8     Pain Loc      Pain Edu?      Excl. in GC?    No data found.  Updated Vital Signs BP 121/84 (BP Location: Left Arm)   Pulse 92   Temp 99 F (37.2 C) (Oral)   Resp 16   SpO2 98%   Visual Acuity Right Eye Distance:   Left Eye Distance:   Bilateral Distance:    Right Eye Near:   Left Eye Near:    Bilateral Near:     Physical Exam Vitals reviewed.  Constitutional:      Appearance: Normal appearance.  Cardiovascular:     Rate and Rhythm: Normal rate and regular rhythm.  Pulmonary:     Effort: Pulmonary effort is normal.     Breath sounds: Normal breath sounds and air entry.  Musculoskeletal:     Cervical back: Rigidity and tenderness present. No signs of trauma. Pain with movement, spinous process tenderness and muscular tenderness present. Decreased range of motion.  Lymphadenopathy:     Cervical: No cervical adenopathy.  Neurological:     Mental Status: She is alert.  Psychiatric:        Attention and Perception: Attention normal.        Mood and Affect: Mood normal.        Speech: Speech normal.        Behavior: Behavior normal. Behavior is cooperative.     UC Treatments / Results  Labs (all labs ordered are listed, but only abnormal results are displayed) Labs Reviewed - No data to display  EKG   Radiology  No results  found.  Procedures Procedures (including critical care time)  Medications Ordered in UC Medications  dexamethasone (DECADRON) injection 10 mg (10 mg Intramuscular Given 11/18/21 1442)    Initial Impression / Assessment and Plan / UC Course  I have reviewed the triage vital signs and the nursing notes.  Pertinent labs & imaging results that were available during my care of the patient were reviewed by me and considered in my medical decision making (see chart for details).    Acute muscle stiffness of neck Decadron IM given here clinic. Prednisone, start tomorrow and take for 5 days. Flexeril as needed for pain. Heat applications. If symptoms worsen or do not improve follow-up at [facility]. Final Clinical Impressions(s) / UC Diagnoses   Final diagnoses:  Acute muscle stiffness of neck  Cervical radiculopathy     Discharge Instructions      Apply heat to neck to help with pain. Start prednisone 20 mg daily for 5 days for neck pain this transition to Meloxicam as need for any muscle related pain. For acute pain Flexeril 10 mg  twice daily as needed for pain (medication causes severe drowsiness).      ED Prescriptions     Medication Sig Dispense Auth. Provider   predniSONE (DELTASONE) 20 MG tablet  (Status: Discontinued) Take 1 tablet (20 mg total) by mouth daily with breakfast for 5 days. 5 tablet Bing Neighbors, FNP   cyclobenzaprine (FLEXERIL) 10 MG tablet  (Status: Discontinued) Take 1 tablet (10 mg total) by mouth 2 (two) times daily as needed for muscle spasms. 20 tablet Bing Neighbors, FNP   meloxicam (MOBIC) 15 MG tablet  (Status: Discontinued) Take 1 tablet (15 mg total) by mouth daily as needed for pain. 30 tablet Bing Neighbors, FNP   cyclobenzaprine (FLEXERIL) 10 MG tablet Take 1 tablet (10 mg total) by mouth 2 (two) times daily as needed for muscle spasms. 20 tablet Bing Neighbors, FNP   meloxicam (MOBIC) 15 MG tablet Take 1 tablet (15 mg  total) by mouth daily as needed for pain. 30 tablet Bing Neighbors, FNP   predniSONE (DELTASONE) 20 MG tablet Take 1 tablet (20 mg total) by mouth daily with breakfast for 5 days. 5 tablet Bing Neighbors, FNP      PDMP not reviewed this encounter.   Bing Neighbors, [place] 11/20/21 [phone]

## 2021-11-18 NOTE — Discharge Instructions (Addendum)
Apply heat to neck to help with pain. ?Start prednisone 20 mg daily for 5 days for neck pain this transition to Meloxicam as need for any muscle related pain. ?For acute pain Flexeril 10 mg  twice daily as needed for pain (medication causes severe drowsiness).  ?

## 2021-11-18 NOTE — ED Triage Notes (Signed)
Pt here with torticollis and acute on chronic neck pain since this morning. Pt has had X-Rays of this area before due to this issue. Pt has limited ROM.  ?

## 2021-12-02 ENCOUNTER — Other Ambulatory Visit (HOSPITAL_COMMUNITY): Payer: Self-pay

## 2022-01-12 ENCOUNTER — Other Ambulatory Visit: Payer: Self-pay

## 2022-03-28 ENCOUNTER — Telehealth: Payer: Self-pay | Admitting: *Deleted

## 2022-03-28 NOTE — Telephone Encounter (Signed)
Submitted PA [employer] on CMM. Key: [id number]. Waiting on determination from [employer].

## 2022-03-28 NOTE — Telephone Encounter (Signed)
The request has been approved. The authorization is effective for a maximum of 12 fills from 03/28/2022 to 03/28/2023, as long as the member is enrolled in their current health plan. The request was approved with a quantity restriction. This has been approved for a max daily dosage of 0.6. A written notification letter will follow with additional details.

## 2022-05-01 ENCOUNTER — Other Ambulatory Visit: Payer: Self-pay

## 2022-05-10 ENCOUNTER — Encounter: Payer: Self-pay | Admitting: Family Medicine

## 2022-05-15 ENCOUNTER — Other Ambulatory Visit: Payer: Self-pay | Admitting: Family Medicine

## 2022-05-15 MED ORDER — FLUCONAZOLE 150 MG PO TABS: 150.0000 mg | ORAL_TABLET | Freq: Once | ORAL | 0 refills | Status: AC

## 2022-05-19 ENCOUNTER — Other Ambulatory Visit: Payer: Self-pay

## 2022-05-19 MED ORDER — METFORMIN HCL ER 500 MG PO TB24
500.0000 mg | ORAL_TABLET | Freq: Two times a day (BID) | ORAL | 11 refills | Status: DC
  Filled 2022-05-19: qty 60, 30d supply, fill #0
  Filled 2022-07-05: qty 60, 30d supply, fill #1
  Filled 2022-08-03 (×2): qty 60, 30d supply, fill #2
  Filled 2022-09-08: qty 60, 30d supply, fill #3
  Filled 2022-10-12: qty 60, 30d supply, fill #4
  Filled 2022-11-16: qty 60, 30d supply, fill #5
  Filled 2022-12-21: qty 60, 30d supply, fill #6
  Filled 2023-02-09: qty 60, 30d supply, fill #7

## 2022-06-04 ENCOUNTER — Other Ambulatory Visit: Payer: Self-pay | Admitting: Family Medicine

## 2022-06-04 DIAGNOSIS — G43709 Chronic migraine without aura, not intractable, without status migrainosus: Secondary | ICD-10-CM

## 2022-06-07 ENCOUNTER — Other Ambulatory Visit (INDEPENDENT_AMBULATORY_CARE_PROVIDER_SITE_OTHER): Payer: No Typology Code available for payment source

## 2022-06-07 DIAGNOSIS — G43709 Chronic migraine without aura, not intractable, without status migrainosus: Secondary | ICD-10-CM | POA: Diagnosis not present

## 2022-06-07 LAB — CBC WITH DIFFERENTIAL/PLATELET
Basophils Absolute: 0.1 10*3/uL (ref 0.0–0.1)
Basophils Relative: 2.3 % (ref 0.0–3.0)
Eosinophils Absolute: 0.1 10*3/uL (ref 0.0–0.7)
Eosinophils Relative: 2 % (ref 0.0–5.0)
HCT: 44.5 % (ref 36.0–46.0)
Hemoglobin: 14.8 g/dL (ref 12.0–15.0)
Lymphocytes Relative: 35.1 % (ref 12.0–46.0)
Lymphs Abs: 2.3 10*3/uL (ref 0.7–4.0)
MCHC: 33.2 g/dL (ref 30.0–36.0)
MCV: 91 fl (ref 78.0–100.0)
Monocytes Absolute: 0.5 10*3/uL (ref 0.1–1.0)
Monocytes Relative: 7 % (ref 3.0–12.0)
Neutro Abs: 3.5 10*3/uL (ref 1.4–7.7)
Neutrophils Relative %: 53.6 % (ref 43.0–77.0)
Platelets: 342 10*3/uL (ref 150.0–400.0)
RBC: 4.89 Mil/uL (ref 3.87–5.11)
RDW: 12.6 % (ref 11.5–15.5)
WBC: 6.4 10*3/uL (ref 4.0–10.5)

## 2022-06-07 LAB — BASIC METABOLIC PANEL
BUN: 14 mg/dL (ref 6–23)
CO2: 30 mEq/L (ref 19–32)
Calcium: 9.8 mg/dL (ref 8.4–10.5)
Chloride: 101 mEq/L (ref 96–112)
Creatinine, Ser: 0.68 mg/dL (ref 0.40–1.20)
GFR: 119.71 mL/min (ref >60.00)
Glucose, Bld: 88 mg/dL (ref 70–99)
Potassium: 4.8 mEq/L (ref 3.5–5.1)
Sodium: 139 mEq/L (ref 135–145)

## 2022-06-07 LAB — TSH: TSH: 1.15 u[IU]/mL (ref 0.35–5.50)

## 2022-06-13 ENCOUNTER — Encounter: Payer: Self-pay | Admitting: Family Medicine

## 2022-06-13 ENCOUNTER — Ambulatory Visit (INDEPENDENT_AMBULATORY_CARE_PROVIDER_SITE_OTHER): Payer: No Typology Code available for payment source | Admitting: Family Medicine

## 2022-06-13 ENCOUNTER — Other Ambulatory Visit: Payer: Self-pay

## 2022-06-13 VITALS — BP 110/80 | HR 90 | Temp 98.5°F | Ht 65.75 in | Wt 158.5 lb

## 2022-06-13 DIAGNOSIS — E282 Polycystic ovarian syndrome: Secondary | ICD-10-CM

## 2022-06-13 DIAGNOSIS — G43709 Chronic migraine without aura, not intractable, without status migrainosus: Secondary | ICD-10-CM

## 2022-06-13 DIAGNOSIS — Z0000 Encounter for general adult medical examination without abnormal findings: Secondary | ICD-10-CM

## 2022-06-13 DIAGNOSIS — F4323 Adjustment disorder with mixed anxiety and depressed mood: Secondary | ICD-10-CM

## 2022-06-13 DIAGNOSIS — Z7189 Other specified counseling: Secondary | ICD-10-CM

## 2022-06-13 DIAGNOSIS — F5000 Anorexia nervosa, unspecified: Secondary | ICD-10-CM

## 2022-06-13 MED ORDER — NONFORMULARY OR COMPOUNDED ITEM: Status: DC

## 2022-06-13 MED ORDER — FLUOXETINE HCL 40 MG PO CAPS
40.0000 mg | ORAL_CAPSULE | Freq: Every day | ORAL | 3 refills | Status: AC
Start: 2022-06-13 — End: ?
  Filled 2022-06-13 – 2022-08-03 (×3): qty 90, 90d supply, fill #0
  Filled 2022-11-09: qty 90, 90d supply, fill #1
  Filled 2023-02-09: qty 30, 30d supply, fill #2

## 2022-06-13 NOTE — Patient Instructions (Addendum)
When checking out, ask the front for a record release for [facility].   Update me as needed.  Thanks for your effort.  Take care.  Glad to see you.

## 2022-06-13 NOTE — Progress Notes (Unsigned)
CPE- See plan.  Routine anticipatory guidance given to patient.  See health maintenance.  The possibility exists that previously documented standard health maintenance information may have been brought forward from a previous encounter into this note.  If needed, that same information has been updated to reflect the current situation based on today's encounter.    Tetanus 2016 Flu done at work.  PNA and shingles not due.  covid vaccine 2021 Mammogram/DXA not due  Pap smear per gyn 2021 Advanced directive discussed with patient.  She would have her husband designated if she were incapacitated. HIV and HCV screening d/w pt.  Reasonable to defer 2023.  Labs d/w pt.    No syncope.    Mood d/w pt.  Still on SSRI at baseline, she wanted to continue.  No SI/HI.  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    Migraine per neuro.  Off birth control.  Doing well in the meantime, ~3 per month with prn remigepant use. She is going to check with neuro about potential remigepant use in pregnancy.    PCOS dx d/w pt, on metformin.  Per GYN.  She is seeing nutrition.  Having irregular menses.   PMH and SH reviewed  Meds, vitals, and allergies reviewed.   ROS: Per HPI.  Unless specifically indicated otherwise in HPI, the patient denies:  General: fever. Eyes: acute vision changes ENT: sore throat Cardiovascular: chest pain Respiratory: SOB GI: vomiting GU: dysuria Musculoskeletal: acute back pain Derm: acute rash Neuro: acute motor dysfunction Psych: worsening mood Endocrine: polydipsia Heme: bleeding Allergy: hayfever  GEN: nad, alert and oriented HEENT: mucous membranes moist NECK: supple w/o LA CV: rrr. PULM: ctab, no inc wob ABD: soft, +bs EXT: no edema SKIN: no acute rash

## 2022-06-14 DIAGNOSIS — E282 Polycystic ovarian syndrome: Secondary | ICD-10-CM | POA: Insufficient documentation

## 2022-06-14 NOTE — Assessment & Plan Note (Signed)
Tetanus 2016 Flu done at work.  PNA and shingles not due.  covid vaccine 2021 Mammogram/DXA not due  Pap smear per gyn 2021 Advanced directive discussed with patient.  She would have her husband designated if she were incapacitated. HIV and HCV screening d/w pt.  Reasonable to defer 2023.

## 2022-06-14 NOTE — Assessment & Plan Note (Signed)
PCOS dx d/w pt, on metformin.  Per GYN.  She is seeing nutrition.  Having irregular menses.  Discussed diet and exercise.  She will update me as needed.

## 2022-06-14 NOTE — Assessment & Plan Note (Signed)
Migraine per neuro.  Off birth control.  Doing well in the meantime, ~3 per month with prn remigepant use. She is going to check with neuro about potential remigepant use in pregnancy.

## 2022-06-14 NOTE — Assessment & Plan Note (Signed)
Still on SSRI at baseline, she wanted to continue.  No SI/HI.  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

## 2022-06-14 NOTE — Assessment & Plan Note (Signed)
Advanced directive discussed with patient.  She would have her husband designated if she were incapacitated.

## 2022-07-05 ENCOUNTER — Other Ambulatory Visit: Payer: Self-pay

## 2022-07-06 ENCOUNTER — Other Ambulatory Visit (HOSPITAL_COMMUNITY): Payer: Self-pay

## 2022-07-25 ENCOUNTER — Encounter: Payer: Self-pay | Admitting: Family Medicine

## 2022-07-26 DIAGNOSIS — F419 Anxiety disorder, unspecified: Secondary | ICD-10-CM | POA: Diagnosis not present

## 2022-07-30 ENCOUNTER — Other Ambulatory Visit: Payer: Self-pay | Admitting: Family Medicine

## 2022-07-30 DIAGNOSIS — Z111 Encounter for screening for respiratory tuberculosis: Secondary | ICD-10-CM

## 2022-07-30 NOTE — Progress Notes (Signed)
I put in the order for the TB skin test and completed her form.  Please scanned the form and give a copy to the patient.  She should be able to pick it up when she comes in for labs.  I sent her a MyChart message to schedule a lab visit and pick up the phone.  Thanks.

## 2022-08-01 ENCOUNTER — Other Ambulatory Visit: Payer: Self-pay

## 2022-08-01 DIAGNOSIS — E282 Polycystic ovarian syndrome: Secondary | ICD-10-CM | POA: Diagnosis not present

## 2022-08-01 DIAGNOSIS — N915 Oligomenorrhea, unspecified: Secondary | ICD-10-CM | POA: Diagnosis not present

## 2022-08-01 MED ORDER — MEDROXYPROGESTERONE ACETATE 10 MG PO TABS
10.0000 mg | ORAL_TABLET | Freq: Every day | ORAL | 11 refills | Status: DC
  Filled 2022-08-01: qty 10, 10d supply, fill #0
  Filled 2022-10-12: qty 10, 10d supply, fill #1

## 2022-08-03 ENCOUNTER — Other Ambulatory Visit: Payer: Self-pay

## 2022-08-10 DIAGNOSIS — F419 Anxiety disorder, unspecified: Secondary | ICD-10-CM | POA: Diagnosis not present

## 2022-08-18 ENCOUNTER — Other Ambulatory Visit (INDEPENDENT_AMBULATORY_CARE_PROVIDER_SITE_OTHER): Payer: 59

## 2022-08-18 DIAGNOSIS — Z111 Encounter for screening for respiratory tuberculosis: Secondary | ICD-10-CM | POA: Diagnosis not present

## 2022-08-20 LAB — QUANTIFERON-TB GOLD PLUS
Mitogen-NIL: >10 IU/mL
NIL: 0.04 IU/mL
QuantiFERON-TB Gold Plus: NEGATIVE
TB1-NIL: 0 IU/mL
TB2-NIL: 0 IU/mL

## 2022-08-24 ENCOUNTER — Ambulatory Visit: Payer: 59 | Admitting: Nurse Practitioner

## 2022-08-30 DIAGNOSIS — F419 Anxiety disorder, unspecified: Secondary | ICD-10-CM | POA: Diagnosis not present

## 2022-09-08 ENCOUNTER — Other Ambulatory Visit: Payer: Self-pay

## 2022-09-12 DIAGNOSIS — F419 Anxiety disorder, unspecified: Secondary | ICD-10-CM | POA: Diagnosis not present

## 2022-09-13 ENCOUNTER — Ambulatory Visit: Payer: 59

## 2022-09-13 VITALS — BP 130/87 | HR 96 | Resp 16 | Ht 66.0 in | Wt 162.0 lb

## 2022-09-13 DIAGNOSIS — Z01419 Encounter for gynecological examination (general) (routine) without abnormal findings: Secondary | ICD-10-CM | POA: Diagnosis not present

## 2022-09-13 DIAGNOSIS — Z0000 Encounter for general adult medical examination without abnormal findings: Secondary | ICD-10-CM

## 2022-09-13 NOTE — Progress Notes (Signed)
Last pap 10/23 @ [facility]

## 2022-09-13 NOTE — Progress Notes (Signed)
   Subjective:     Jessica [name] is a 27 y.o. female here at [facility] for a routine exam.  Current complaints: she is here to establish care. She recently had a pap smear at [facility] in October which was normal. She was diagnosed with PCOS (by [place]) and started on Metformin in October as well. Up until starting Metformin she had regular cycles approximately every 28 days. Since starting Metformin she has not had a regular cycle. She was given a prescription for Provera which she took recently which did cause her to bleed. She reports she was told by previous provider to wait at least 30 days after taking Provera before taking again if needed. She desires to conceive.   [place] Office Visit from 06/13/2022 in [place] at [facility]  PHQ-2 Total Score 0       Health Maintenance Due  Topic Date Due   HIV Screening  Never done   Hepatitis C Screening  Never done   COVID-19 Vaccine (3 - Pfizer risk series) 05/14/2020    Risk factors for chronic health problems: Smoking: denies Alchohol/how much: denies Illicit drug use: denies Pt BMI: Body mass index is 26.15 kg/m.   Gynecologic History Patient's last menstrual period was 08/23/2022. Contraception: none, trying to conceive Last Pap: 04/2022. Results were: normal Sexual health: no issues Last mammogram: n/a  Obstetric History OB History  Gravida Para Term Preterm AB Living  0 0 0 0 0 0  SAB IAB Ectopic Multiple Live Births  0 0 0 0 0    The following portions of the patient's history were reviewed and updated as appropriate: allergies, current medications, past family history, past medical history, past social history, past surgical history, and problem list.  Review of Systems Pertinent items are noted in HPI.    Objective:   VS reviewed, nursing note reviewed,  Constitutional: well developed, well nourished, no distress HEENT: normocephalic, thyroid without enlargement or  mass HEART: RRR, no murmurs rubs/gallops RESP: clear and equal to auscultation bilaterally in all lobes  Breast Exam:  Deferred  Abdomen: soft Neuro: alert and oriented x 3 Skin: warm, dry Psych: affect normal Pelvic exam: Deferred  Assessment/Plan:   1. Well woman exam without gynecological exam - Establish care - Continue Metformin. She has prescription for Provera if she does not have a cycle 30 days after LMP. Patient instructed to take UPT prior to starting Provera.  - Follow up in 3 months or sooner as needed     No follow-ups on file.   Renee Harder, CNM 3:12 PM

## 2022-09-22 ENCOUNTER — Encounter: Payer: Self-pay | Admitting: Family Medicine

## 2022-09-25 ENCOUNTER — Ambulatory Visit: Payer: No Typology Code available for payment source | Admitting: Family Medicine

## 2022-09-25 NOTE — Progress Notes (Deleted)
No chief complaint on file.    HISTORY OF PRESENT ILLNESS:  09/25/22 ALL: Jessica [name] returns for follow up for migraines. She was last seen 09/2021 and continued Ajovy and Nurtec. Since,   09/21/2021 ALL:  Jessica [name] is a 27 y.o. female here today for follow up for  migraines. During her last visit she was planning to pursue Botox injections but she plans to get pregnant in the near future. At baseline she has headaches almost daily, with 20 of them being migrainous. She was provided Ajovy and Nurtec samples during her last visit and has had >50% reduction in total headaches. She has previously failed Emgality. She has been using Maxalt for abortive therapy but reports this gives her chest tightness. She reports that the Nurtec samples have worked well for abortive therapy.  HISTORY (copied from previous note) AA 06/01/2021: Failed Emgality, tried Ajovy, Aimovig contraindicated. Started [employer] in April due to insurance not paying for Ajovy anymore and she is having daily migraines.  Aimovig is contraindicated due to constipation. Has tried multiple triptans and cause chest tightness, imitrex, maxalt, discussed nurtec** and will mychart me with results.  Over the last 6 months patient has been having daily migraines, they can last 4 to 12 hours, they can be unilateral or holocephalic, retro-orbital, pulsating pounding and throbbing, with nausea, photophobia and phonophobia, movement makes it worse, no aura, no medication overuse.  We discussed multiple options today, she is thinking about becoming pregnant, I did discuss that although no medications are safe I do believe that Botox is compatible with pregnancy and lactation, we discussed that it is a category X medication and what that means and the risks of using Botox during pregnancy.   AA 01/19/2020: She is doing great on the Ajovy. Works [employer], manufacturing. Only taken the maxalt twice since last seen.  Patient only had to use her Maxalt a few times since  last being seen, and is worked well, we did discuss again Ajovy and risks in pregnancy do not get pregnant, we also discussed that she is on Topamax and that we can slowly further decrease it, we discussed decreasing it, I do not think she will have any problems if she goes down to 50 mg and hold there for several weeks to a month and if she wants to stop it at that time she can.  Also discussed that with migraines get worse go back to her last previous dose.  There can be risk for rebound seizures but I doubt it at this dosage, we did discuss that.  REVIEW OF SYSTEMS: Out of a complete 14 system review of symptoms, the patient complains only of the following symptoms, Headaches and all other reviewed systems are negative.   ALLERGIES: Allergies  Allergen Reactions   Amoxicillin Rash   Doxycycline Other (See Comments)    Possible cause of rash   Maxalt [Rizatriptan]     Chest Tightness     HOME MEDICATIONS: Outpatient Medications Prior to Visit  Medication Sig Dispense Refill   FLUoxetine (PROZAC) 40 MG capsule Take 1 capsule (40 mg total) by mouth daily. 90 capsule 3   medroxyPROGESTERone (PROVERA) 10 MG tablet Take 1 tablet (10 mg total) by mouth daily for 10 days (Patient not taking: Reported on 09/13/2022) 10 tablet 11   metFORMIN (GLUCOPHAGE-XR) 500 MG 24 hr tablet Take 1 tablet (500 mg total) by mouth 2 (two) times daily. 60 tablet 11   NONFORMULARY OR COMPOUNDED ITEM Ovasitol supplement  ondansetron (ZOFRAN-ODT) 4 MG disintegrating tablet Take 1-2 tablets (4-8 mg total) by mouth every 8 (eight) hours as needed for nausea. 30 tablet 11   Rimegepant Sulfate 75 MG TBDP Take 1 tablet (75 mg total) by mouth as needed (at the onset of headache). 8 tablet 11   No facility-administered medications prior to visit.     PAST MEDICAL HISTORY: Past Medical History:  Diagnosis Date   Adjustment disorder with mixed anxiety and depressed mood    Anorexia nervosa    Autonomic dysfunction     Migraine headache    POTS (postural orthostatic tachycardia syndrome)    Splenomegaly    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   Syncope 09/22/2014   Vasovagal syncope      PAST SURGICAL HISTORY: Past Surgical History:  Procedure Laterality Date   ANTERIOR CRUCIATE LIGAMENT REPAIR  03/2010   EYE SURGERY  2000   clogged tear duct   WISDOM TOOTH EXTRACTION  2015     FAMILY HISTORY: Family History  Problem Relation Age of Onset   Migraines Mother        Started 66th or 5th grade   Seizures Mother        Febrile Seizures as a child   Seizures Father        Febrile Seizures as a child   Hypertension Father    Kidney Stones Father    Migraines Maternal Aunt    Migraines Maternal Uncle    Diabetes Maternal Grandmother    Stroke Maternal Grandfather        mini-stroke   Cancer Maternal Grandfather        Bladder cancer, Died at 78   Diabetes Maternal Grandfather    Mental retardation Other        Maternal Second Cousin   Other Other        Maternal Great Uncle had some sort of Retinal Vessel Occlusion   Breast cancer Maternal Aunt    Cancer Maternal Aunt        Peritoneal CA   Eating disorder Maternal Aunt    Depression Other        Father's side of the family   Colon cancer Neg Hx      SOCIAL HISTORY: Social History   Socioeconomic History   Marital status: Married    Spouse name: Marcelino Scot   Number of children: 0   Years of education: Not on file   Highest education level: Bachelor's degree (e.g., BA, AB, BS)  Occupational History    Comment: RN [employer]  Tobacco Use   Smoking status: Never   Smokeless tobacco: Never  Vaping Use   Vaping Use: Never used  Substance and Sexual Activity   Alcohol use: Yes    Comment: occ   Drug use: No   Sexual activity: Yes  Other Topics Concern   Not on file  Social History Narrative   RN at [employer]   She lives with husband    caffeine coffee 2 cups daily   Social Determinants of Health   Financial Resource Strain: Not on  file  Food Insecurity: Not on file  Transportation Needs: Not on file  Physical Activity: Not on file  Stress: Not on file  Social Connections: Not on file  Intimate Partner Violence: Not on file     PHYSICAL EXAM  There were no vitals filed for this visit.  There is no height or weight on file to calculate BMI.  Generalized: Well developed,  in no acute distress  Cardiology: normal rate and rhythm, no murmur auscultated  Respiratory: clear to auscultation bilaterally    Neurological examination  Mentation: Alert oriented to time, place, history taking. Follows all commands speech and language fluent Cranial nerve II-XII: Pupils were equal round reactive to light. Extraocular movements were full, visual field were full on confrontational test. Facial sensation and strength were normal. Uvula tongue midline. Head turning and shoulder shrug  were normal and symmetric. Motor: The motor testing reveals 5 over 5 strength of all 4 extremities. Good symmetric motor tone is noted throughout.  Sensory: Sensory testing is intact to soft touch on all 4 extremities. No evidence of extinction is noted.  Coordination: Cerebellar testing reveals good finger-nose-finger and heel-to-shin bilaterally.  Gait and station: Gait is normal. Tandem gait is normal. Romberg is negative. No drift is seen.  Reflexes: Deep tendon reflexes are symmetric and normal bilaterally.    DIAGNOSTIC DATA (LABS, IMAGING, TESTING) - I reviewed patient records, labs, notes, testing and imaging myself where available.  Lab Results  Component Value Date   WBC 6.4 06/07/2022   HGB 14.8 06/07/2022   HCT 44.5 06/07/2022   MCV 91.0 06/07/2022   PLT 342.0 06/07/2022      Component Value Date/Time   NA 139 06/07/2022 0741   NA 141 11/30/2016 1657   K 4.8 06/07/2022 0741   CL 101 06/07/2022 0741   CO2 30 06/07/2022 0741   GLUCOSE 88 06/07/2022 0741   BUN 14 06/07/2022 0741   BUN 13 11/30/2016 1657   CREATININE 0.68  06/07/2022 0741   CREATININE 0.82 07/20/2016 1455   CALCIUM 9.8 06/07/2022 0741   PROT 7.1 05/06/2019 0841   PROT 7.2 11/30/2016 1657   ALBUMIN 4.3 05/06/2019 0841   ALBUMIN 4.6 11/30/2016 1657   AST 18 05/06/2019 0841   ALT 27 05/06/2019 0841   ALKPHOS 51 05/06/2019 0841   BILITOT 0.4 05/06/2019 0841   BILITOT 0.2 11/30/2016 1657   GFRNONAA >60 02/05/2019 1604   GFRAA >60 02/05/2019 1604   Lab Results  Component Value Date   CHOL 187 (H) 04/29/2015   HDL 38 04/29/2015   LDLCALC 126 (H) 04/29/2015   TRIG 116 04/29/2015   CHOLHDL 4.9 04/29/2015   Lab Results  Component Value Date   HGBA1C 5.4 04/29/2015   No results found for: "VITAMINB12" Lab Results  Component Value Date   TSH 1.15 06/07/2022        No data to display               No data to display           ASSESSMENT AND PLAN  27 y.o. year old female  has a past medical history of Adjustment disorder with mixed anxiety and depressed mood, Anorexia nervosa, Autonomic dysfunction, Migraine headache, POTS (postural orthostatic tachycardia syndrome), Splenomegaly, Syncope (09/22/2014), and Vasovagal syncope. here with    No diagnosis found.  Othello has tolerated the Ajovy and Nurtec and has had great symptom management. We will continue the Ajovy injections monthly and Nurtec as needed for abortive therapy. Discussed recommendations of discontinuing Ajovy 6 months prior to desired planned pregnancy. We discussed her letting us know if she were to become pregnant as we will need to switch her medications. She will follow up with me in 1 year or sooner if needed.  No orders of the defined types were placed in this encounter.   No orders of the defined types were placed  in this encounter.    Debbora Presto, MSN, FNP-C 09/25/2022, 7:25 AM  [facility] [address], [place], [place] [address] [phone]

## 2022-10-05 DIAGNOSIS — F419 Anxiety disorder, unspecified: Secondary | ICD-10-CM | POA: Diagnosis not present

## 2022-10-11 ENCOUNTER — Telehealth: Payer: 59 | Admitting: Family

## 2022-10-11 DIAGNOSIS — B3731 Acute candidiasis of vulva and vagina: Secondary | ICD-10-CM | POA: Diagnosis not present

## 2022-10-11 MED ORDER — FLUCONAZOLE 150 MG PO TABS
150.0000 mg | ORAL_TABLET | ORAL | 0 refills | Status: DC | PRN
  Filled 2022-10-11: qty 3, 9d supply, fill #0

## 2022-10-11 NOTE — Progress Notes (Signed)
E-Visit for Vaginal Symptoms ? ?We are sorry that you are not feeling well. Here is how we plan to help! ?Based on what you shared with me it looks like you: May have a yeast vaginosis ? ?Vaginosis is an inflammation of the vagina that can result in discharge, itching and pain. The cause is usually a change in the normal balance of vaginal bacteria or an infection. Vaginosis can also result from reduced estrogen levels after menopause. ? ?The most common causes of vaginosis are: ? ? Bacterial vaginosis which results from an overgrowth of one on several organisms that are normally present in your vagina. ? ? Yeast infections which are caused by a naturally occurring fungus called candida. ? ? Vaginal atrophy (atrophic vaginosis) which results from the thinning of the vagina from reduced estrogen levels after menopause. ? ? Trichomoniasis which is caused by a parasite and is commonly transmitted by sexual intercourse. ? ?Factors that increase your risk of developing vaginosis include: ?Medications, such as antibiotics and steroids ?Uncontrolled diabetes ?Use of hygiene products such as bubble bath, vaginal spray or vaginal deodorant ?Douching ?Wearing damp or tight-fitting clothing ?Using an intrauterine device (IUD) for birth control ?Hormonal changes, such as those associated with pregnancy, birth control pills or menopause ?Sexual activity ?Having a sexually transmitted infection ? ?Your treatment plan is A single Diflucan (fluconazole) 150mg tablet once.  I have electronically sent this prescription into the pharmacy that you have chosen. ? ?Be sure to take all of the medication as directed. Stop taking any medication if you develop a rash, tongue swelling or shortness of breath. Mothers who are breast feeding should consider pumping and discarding their breast milk while on these antibiotics. However, there is no consensus that infant exposure at these doses would be harmful.  ?Remember that medication creams can  weaken latex condoms. ?. ? ? ?HOME CARE: ? ?Good hygiene may prevent some types of vaginosis from recurring and may relieve some symptoms: ? ?Avoid baths, hot tubs and whirlpool spas. Rinse soap from your outer genital area after a shower, and dry the area well to prevent irritation. Don't use scented or harsh soaps, such as those with deodorant or antibacterial action. ?Avoid irritants. These include scented tampons and pads. ?Wipe from front to back after using the toilet. Doing so avoids spreading fecal bacteria to your vagina. ? ?Other things that may help prevent vaginosis include: ? ?Don't douche. Your vagina doesn't require cleansing other than normal bathing. Repetitive douching disrupts the normal organisms that reside in the vagina and can actually increase your risk of vaginal infection. Douching won't clear up a vaginal infection. ?Use a latex condom. Both female and female latex condoms may help you avoid infections spread by sexual contact. ?Wear cotton underwear. Also wear pantyhose with a cotton crotch. If you feel comfortable without it, skip wearing underwear to bed. Yeast thrives in moist environments ?Your symptoms should improve in the next day or two. ? ?GET HELP RIGHT AWAY IF: ? ?You have pain in your lower abdomen ( pelvic area or over your ovaries) ?You develop nausea or vomiting ?You develop a fever ?Your discharge changes or worsens ?You have persistent pain with intercourse ?You develop shortness of breath, a rapid pulse, or you faint. ? ?These symptoms could be signs of problems or infections that need to be evaluated by a medical provider now. ? ?MAKE SURE YOU  ? ?Understand these instructions. ?Will watch your condition. ?Will get help right away if you are not   doing well or get worse. ? ?Thank you for choosing an e-visit. ? ?Your e-visit answers were reviewed by a board certified advanced clinical practitioner to complete your personal care plan. Depending upon the condition, your plan  could have included both over the counter or prescription medications. ? ?Please review your pharmacy choice. Make sure the pharmacy is open so you can pick up prescription now. If there is a problem, you may contact your provider through MyChart messaging and have the prescription routed to another pharmacy.  Your safety is important to us. If you have drug allergies check your prescription carefully.  ? ?For the next 24 hours you can use MyChart to ask questions about today's visit, request a non-urgent call back, or ask for a work or school excuse. ?You will get an email in the next two days asking about your experience. I hope that your e-visit has been valuable and will speed your recovery. ? ?Approximately 5 minutes was spent documenting and reviewing patient's chart. ? ?

## 2022-10-12 ENCOUNTER — Other Ambulatory Visit: Payer: Self-pay

## 2022-10-17 DIAGNOSIS — F419 Anxiety disorder, unspecified: Secondary | ICD-10-CM | POA: Diagnosis not present

## 2022-10-30 DIAGNOSIS — F419 Anxiety disorder, unspecified: Secondary | ICD-10-CM | POA: Diagnosis not present

## 2022-11-09 ENCOUNTER — Other Ambulatory Visit: Payer: Self-pay

## 2022-11-15 DIAGNOSIS — F419 Anxiety disorder, unspecified: Secondary | ICD-10-CM | POA: Diagnosis not present

## 2022-11-16 ENCOUNTER — Other Ambulatory Visit: Payer: Self-pay

## 2022-11-17 ENCOUNTER — Other Ambulatory Visit: Payer: Self-pay | Admitting: Neurology

## 2022-11-17 ENCOUNTER — Other Ambulatory Visit: Payer: Self-pay

## 2022-11-17 DIAGNOSIS — G43709 Chronic migraine without aura, not intractable, without status migrainosus: Secondary | ICD-10-CM

## 2022-11-23 ENCOUNTER — Other Ambulatory Visit: Payer: Self-pay

## 2022-11-28 DIAGNOSIS — F419 Anxiety disorder, unspecified: Secondary | ICD-10-CM | POA: Diagnosis not present

## 2022-12-12 DIAGNOSIS — F419 Anxiety disorder, unspecified: Secondary | ICD-10-CM | POA: Diagnosis not present

## 2022-12-22 ENCOUNTER — Other Ambulatory Visit: Payer: Self-pay

## 2023-01-02 DIAGNOSIS — F419 Anxiety disorder, unspecified: Secondary | ICD-10-CM | POA: Diagnosis not present

## 2023-01-16 DIAGNOSIS — F419 Anxiety disorder, unspecified: Secondary | ICD-10-CM | POA: Diagnosis not present

## 2023-02-09 ENCOUNTER — Other Ambulatory Visit: Payer: Self-pay

## 2023-03-22 ENCOUNTER — Other Ambulatory Visit: Payer: Self-pay

## 2023-04-05 ENCOUNTER — Other Ambulatory Visit: Payer: Self-pay

## 2023-04-06 ENCOUNTER — Other Ambulatory Visit: Payer: Self-pay

## 2023-09-13 LAB — OB RESULTS CONSOLE HIV ANTIBODY (ROUTINE TESTING): HIV: NONREACTIVE

## 2023-09-13 LAB — OB RESULTS CONSOLE RUBELLA ANTIBODY, IGM: Rubella: IMMUNE

## 2023-09-13 LAB — OB RESULTS CONSOLE RPR: RPR: NONREACTIVE

## 2023-09-13 LAB — OB RESULTS CONSOLE HEPATITIS B SURFACE ANTIGEN: Hepatitis B Surface Ag: NEGATIVE

## 2023-09-17 LAB — HEPATITIS C ANTIBODY: HCV Ab: NEGATIVE

## 2024-01-08 DIAGNOSIS — O43199 Other malformation of placenta, unspecified trimester: Secondary | ICD-10-CM | POA: Insufficient documentation

## 2024-02-08 DIAGNOSIS — O24419 Gestational diabetes mellitus in pregnancy, unspecified control: Secondary | ICD-10-CM | POA: Insufficient documentation

## 2024-02-09 ENCOUNTER — Observation Stay (HOSPITAL_COMMUNITY)
Admission: EM | Admit: 2024-02-09 | Discharge: 2024-02-10 | Disposition: A | Discharge status: Home / Self Care | Attending: Obstetrics and Gynecology | Admitting: Obstetrics and Gynecology

## 2024-02-09 ENCOUNTER — Other Ambulatory Visit: Payer: Self-pay

## 2024-02-09 ENCOUNTER — Encounter (HOSPITAL_COMMUNITY): Payer: Self-pay | Admitting: Emergency Medicine

## 2024-02-09 DIAGNOSIS — R1011 Right upper quadrant pain: Secondary | ICD-10-CM | POA: Insufficient documentation

## 2024-02-09 DIAGNOSIS — O9A213 Injury, poisoning and certain other consequences of external causes complicating pregnancy, third trimester: Principal | ICD-10-CM | POA: Insufficient documentation

## 2024-02-09 DIAGNOSIS — D72829 Elevated white blood cell count, unspecified: Secondary | ICD-10-CM | POA: Diagnosis not present

## 2024-02-09 DIAGNOSIS — Y92410 Unspecified street and highway as the place of occurrence of the external cause: Secondary | ICD-10-CM | POA: Diagnosis not present

## 2024-02-09 HISTORY — DX: Anxiety disorder, unspecified: F41.9

## 2024-02-09 HISTORY — DX: Depression, unspecified: F32.A

## 2024-02-09 LAB — COMPREHENSIVE METABOLIC PANEL WITH GFR
ALT: 16 U/L (ref 0–44)
AST: 18 U/L (ref 15–41)
Albumin: 3.1 g/dL — ABNORMAL LOW (ref 3.5–5.0)
Alkaline Phosphatase: 67 U/L (ref 38–126)
Anion gap: 12 (ref 5–15)
BUN: 8 mg/dL (ref 6–20)
CO2: 21 mmol/L — ABNORMAL LOW (ref 22–32)
Calcium: 9.6 mg/dL (ref 8.9–10.3)
Chloride: 103 mmol/L (ref 98–111)
Creatinine, Ser: 0.61 mg/dL (ref 0.44–1.00)
GFR, Estimated: >60 mL/min (ref >60)
Glucose, Bld: 122 mg/dL — ABNORMAL HIGH (ref 70–99)
Potassium: 3.5 mmol/L (ref 3.5–5.1)
Sodium: 136 mmol/L (ref 135–145)
Total Bilirubin: 0.5 mg/dL (ref 0.0–1.2)
Total Protein: 6.6 g/dL (ref 6.5–8.1)

## 2024-02-09 LAB — CBC WITH DIFFERENTIAL/PLATELET
Abs Immature Granulocytes: 0.2 K/uL — ABNORMAL HIGH (ref 0.00–0.07)
Basophils Absolute: 0.1 K/uL (ref 0.0–0.1)
Basophils Relative: 1 %
Eosinophils Absolute: 0.1 K/uL (ref 0.0–0.5)
Eosinophils Relative: 1 %
HCT: 37.8 % (ref 36.0–46.0)
Hemoglobin: 12.6 g/dL (ref 12.0–15.0)
Immature Granulocytes: 1 %
Lymphocytes Relative: 21 %
Lymphs Abs: 3.1 K/uL (ref 0.7–4.0)
MCH: 30.9 pg (ref 26.0–34.0)
MCHC: 33.3 g/dL (ref 30.0–36.0)
MCV: 92.6 fL (ref 80.0–100.0)
Monocytes Absolute: 0.9 K/uL (ref 0.1–1.0)
Monocytes Relative: 6 %
Neutro Abs: 10.2 K/uL — ABNORMAL HIGH (ref 1.7–7.7)
Neutrophils Relative %: 70 %
Platelets: 283 K/uL (ref 150–400)
RBC: 4.08 MIL/uL (ref 3.87–5.11)
RDW: 13.1 % (ref 11.5–15.5)
WBC: 14.6 K/uL — ABNORMAL HIGH (ref 4.0–10.5)
nRBC: 0 % (ref 0.0–0.2)

## 2024-02-09 MED ORDER — DOCUSATE SODIUM 100 MG PO CAPS
100.0000 mg | ORAL_CAPSULE | Freq: Every day | ORAL | Status: DC
  Administered 2024-02-10: 100 mg via ORAL
  Filled 2024-02-09: qty 1

## 2024-02-09 MED ORDER — ACETAMINOPHEN 325 MG PO TABS
650.0000 mg | ORAL_TABLET | ORAL | Status: DC | PRN
  Administered 2024-02-10: 650 mg via ORAL
  Filled 2024-02-09: qty 2

## 2024-02-09 MED ORDER — CALCIUM CARBONATE ANTACID 500 MG PO CHEW: 2.0000 | CHEWABLE_TABLET | ORAL | Status: DC | PRN

## 2024-02-09 MED ORDER — PRENATAL MULTIVITAMIN CH
1.0000 | ORAL_TABLET | Freq: Every day | ORAL | Status: DC
  Administered 2024-02-10: 1 via ORAL
  Filled 2024-02-09: qty 1

## 2024-02-09 MED ORDER — ACETAMINOPHEN 500 MG PO TABS
500.0000 mg | ORAL_TABLET | Freq: Four times a day (QID) | ORAL | Status: DC | PRN
  Administered 2024-02-10: 500 mg via ORAL
  Filled 2024-02-09: qty 1

## 2024-02-09 MED ORDER — LACTATED RINGERS IV SOLN: 125.0000 mL/h | INTRAVENOUS | Status: DC
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
## 2024-02-10 ENCOUNTER — Other Ambulatory Visit: Payer: Self-pay

## 2024-02-10 ENCOUNTER — Encounter (HOSPITAL_COMMUNITY): Payer: Self-pay

## 2024-02-10 LAB — PROTIME-INR
INR: 1 (ref 0.8–1.2)
Prothrombin Time: 13.4 s (ref 11.4–15.2)

## 2024-02-10 LAB — TYPE AND SCREEN
ABO/RH(D): B POS
Antibody Screen: NEGATIVE

## 2024-02-10 LAB — FIBRINOGEN: Fibrinogen: 593 mg/dL — ABNORMAL HIGH (ref 210–475)

## 2024-02-10 LAB — GLUCOSE, CAPILLARY
Glucose-Capillary: 111 mg/dL — ABNORMAL HIGH (ref 70–99)
Glucose-Capillary: 109 mg/dL — ABNORMAL HIGH (ref 70–99)
Glucose-Capillary: 123 mg/dL — ABNORMAL HIGH (ref 70–99)
Glucose-Capillary: 117 mg/dL — ABNORMAL HIGH (ref 70–99)

## 2024-02-10 LAB — APTT: aPTT: 26 s (ref 24–36)

## 2024-02-10 MED ORDER — ACETAMINOPHEN 325 MG PO TABS
650.0000 mg | ORAL_TABLET | ORAL | 0 refills | Status: AC | PRN
  Filled 2024-02-10: qty 30, 3d supply, fill #0

## 2024-02-10 NOTE — Progress Notes (Signed)
 Discharged at 1345. All discharge instructions given, all questions and concerns addressed, and patient verbalized understanding of all information given. Patient was alert and oriented x4, ambulatory in status, and VS and pain stable.
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
## 2024-02-10 NOTE — Plan of Care (Signed)
  Problem: Education: Goal: Knowledge of disease or condition will improve Outcome: Adequate for Discharge Goal: Knowledge of the prescribed therapeutic regimen will improve Outcome: Adequate for Discharge Goal: Individualized Educational Video(s) Outcome: Adequate for Discharge   Problem: Clinical Measurements: Goal: Complications related to the disease process, condition or treatment will be avoided or minimized Outcome: Adequate for Discharge   Problem: Education: Goal: Ability to describe self-care measures that may prevent or decrease complications (Diabetes Survival Skills Education) will improve Outcome: Adequate for Discharge Goal: Individualized Educational Video(s) Outcome: Adequate for Discharge   Problem: Coping: Goal: Ability to adjust to condition or change in health will improve Outcome: Adequate for Discharge   Problem: Fluid Volume: Goal: Ability to maintain a balanced intake and output will improve Outcome: Adequate for Discharge   Problem: Health Behavior/Discharge Planning: Goal: Ability to identify and utilize available resources and services will improve Outcome: Adequate for Discharge Goal: Ability to manage health-related needs will improve Outcome: Adequate for Discharge   Problem: Metabolic: Goal: Ability to maintain appropriate glucose levels will improve Outcome: Adequate for Discharge   Problem: Nutritional: Goal: Maintenance of adequate nutrition will improve Outcome: Adequate for Discharge Goal: Progress toward achieving an optimal weight will improve Outcome: Adequate for Discharge   Problem: Skin Integrity: Goal: Risk for impaired skin integrity will decrease Outcome: Adequate for Discharge   Problem: Tissue Perfusion: Goal: Adequacy of tissue perfusion will improve Outcome: Adequate for Discharge   Problem: Education: Goal: Knowledge of General Education information will improve Description: Including pain rating scale,  medication(s)/side effects and non-pharmacologic comfort measures Outcome: Adequate for Discharge   Problem: Health Behavior/Discharge Planning: Goal: Ability to manage health-related needs will improve Outcome: Adequate for Discharge   Problem: Clinical Measurements: Goal: Ability to maintain clinical measurements within normal limits will improve Outcome: Adequate for Discharge Goal: Will remain free from infection Outcome: Adequate for Discharge Goal: Diagnostic test results will improve Outcome: Adequate for Discharge Goal: Respiratory complications will improve Outcome: Adequate for Discharge Goal: Cardiovascular complication will be avoided Outcome: Adequate for Discharge   Problem: Activity: Goal: Risk for activity intolerance will decrease Outcome: Adequate for Discharge   Problem: Nutrition: Goal: Adequate nutrition will be maintained Outcome: Adequate for Discharge   Problem: Coping: Goal: Level of anxiety will decrease Outcome: Adequate for Discharge   Problem: Elimination: Goal: Will not experience complications related to bowel motility Outcome: Adequate for Discharge Goal: Will not experience complications related to urinary retention Outcome: Adequate for Discharge   Problem: Pain Managment: Goal: General experience of comfort will improve and/or be controlled Outcome: Adequate for Discharge   Problem: Safety: Goal: Ability to remain free from injury will improve Outcome: Adequate for Discharge   Problem: Skin Integrity: Goal: Risk for impaired skin integrity will decrease Outcome: Adequate for Discharge
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
## 2024-02-10 NOTE — Plan of Care (Signed)
  Problem: Education: Goal: Knowledge of disease or condition will improve Outcome: Progressing Goal: Knowledge of the prescribed therapeutic regimen will improve Outcome: Progressing Goal: Individualized Educational Video(s) Outcome: Progressing   Problem: Clinical Measurements: Goal: Complications related to the disease process, condition or treatment will be avoided or minimized Outcome: Progressing   Problem: Education: Goal: Ability to describe self-care measures that may prevent or decrease complications (Diabetes Survival Skills Education) will improve Outcome: Progressing Goal: Individualized Educational Video(s) Outcome: Progressing   Problem: Coping: Goal: Ability to adjust to condition or change in health will improve Outcome: Progressing   Problem: Fluid Volume: Goal: Ability to maintain a balanced intake and output will improve Outcome: Progressing   Problem: Health Behavior/Discharge Planning: Goal: Ability to identify and utilize available resources and services will improve Outcome: Progressing Goal: Ability to manage health-related needs will improve Outcome: Progressing   Problem: Metabolic: Goal: Ability to maintain appropriate glucose levels will improve Outcome: Progressing   Problem: Nutritional: Goal: Maintenance of adequate nutrition will improve Outcome: Progressing Goal: Progress toward achieving an optimal weight will improve Outcome: Progressing   Problem: Skin Integrity: Goal: Risk for impaired skin integrity will decrease Outcome: Progressing   Problem: Tissue Perfusion: Goal: Adequacy of tissue perfusion will improve Outcome: Progressing   Problem: Education: Goal: Knowledge of General Education information will improve Description: Including pain rating scale, medication(s)/side effects and non-pharmacologic comfort measures Outcome: Progressing   Problem: Health Behavior/Discharge Planning: Goal: Ability to manage health-related  needs will improve Outcome: Progressing   Problem: Clinical Measurements: Goal: Ability to maintain clinical measurements within normal limits will improve Outcome: Progressing Goal: Will remain free from infection Outcome: Progressing Goal: Diagnostic test results will improve Outcome: Progressing Goal: Respiratory complications will improve Outcome: Progressing Goal: Cardiovascular complication will be avoided Outcome: Progressing   Problem: Activity: Goal: Risk for activity intolerance will decrease Outcome: Progressing   Problem: Nutrition: Goal: Adequate nutrition will be maintained Outcome: Progressing   Problem: Coping: Goal: Level of anxiety will decrease Outcome: Progressing   Problem: Elimination: Goal: Will not experience complications related to bowel motility Outcome: Progressing Goal: Will not experience complications related to urinary retention Outcome: Progressing   Problem: Pain Managment: Goal: General experience of comfort will improve and/or be controlled Outcome: Progressing   Problem: Safety: Goal: Ability to remain free from injury will improve Outcome: Progressing   Problem: Skin Integrity: Goal: Risk for impaired skin integrity will decrease Outcome: Progressing
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
## 2024-02-17 ENCOUNTER — Ambulatory Visit: Payer: Self-pay | Admitting: Family Medicine

## 2024-03-25 ENCOUNTER — Inpatient Hospital Stay (HOSPITAL_BASED_OUTPATIENT_CLINIC_OR_DEPARTMENT_OTHER)

## 2024-03-25 ENCOUNTER — Inpatient Hospital Stay (HOSPITAL_COMMUNITY)
Admission: AD | Admit: 2024-03-25 | Discharge: 2024-03-25 | Disposition: A | Discharge status: Home / Self Care | Attending: Obstetrics and Gynecology | Admitting: Obstetrics and Gynecology

## 2024-03-25 ENCOUNTER — Encounter (HOSPITAL_COMMUNITY): Payer: Self-pay | Admitting: Obstetrics and Gynecology

## 2024-03-25 ENCOUNTER — Other Ambulatory Visit: Payer: Self-pay

## 2024-03-25 DIAGNOSIS — O24410 Gestational diabetes mellitus in pregnancy, diet controlled: Secondary | ICD-10-CM | POA: Insufficient documentation

## 2024-03-25 DIAGNOSIS — W19XXXA Unspecified fall, initial encounter: Secondary | ICD-10-CM

## 2024-03-25 DIAGNOSIS — Z3A35 35 weeks gestation of pregnancy: Secondary | ICD-10-CM

## 2024-03-25 DIAGNOSIS — N889 Noninflammatory disorder of cervix uteri, unspecified: Secondary | ICD-10-CM

## 2024-03-25 DIAGNOSIS — O9A213 Injury, poisoning and certain other consequences of external causes complicating pregnancy, third trimester: Secondary | ICD-10-CM

## 2024-03-25 DIAGNOSIS — F418 Other specified anxiety disorders: Secondary | ICD-10-CM | POA: Insufficient documentation

## 2024-03-25 DIAGNOSIS — O99213 Obesity complicating pregnancy, third trimester: Secondary | ICD-10-CM | POA: Diagnosis present

## 2024-03-25 DIAGNOSIS — W010XXA Fall on same level from slipping, tripping and stumbling without subsequent striking against object, initial encounter: Secondary | ICD-10-CM | POA: Diagnosis not present

## 2024-03-25 HISTORY — DX: Polycystic ovarian syndrome: E28.2

## 2024-03-25 LAB — CBC
HCT: 39.9 % (ref 36.0–46.0)
Hemoglobin: 13.4 g/dL (ref 12.0–15.0)
MCH: 31.5 pg (ref 26.0–34.0)
MCHC: 33.6 g/dL (ref 30.0–36.0)
MCV: 93.9 fL (ref 80.0–100.0)
Platelets: 254 K/uL (ref 150–400)
RBC: 4.25 MIL/uL (ref 3.87–5.11)
RDW: 13.6 % (ref 11.5–15.5)
WBC: 10.6 K/uL — ABNORMAL HIGH (ref 4.0–10.5)
nRBC: 0 % (ref 0.0–0.2)

## 2024-03-25 LAB — APTT: aPTT: 28 s (ref 24–36)

## 2024-03-25 LAB — PROTIME-INR
INR: 1 (ref 0.8–1.2)
Prothrombin Time: 14.1 s (ref 11.4–15.2)

## 2024-03-25 LAB — FIBRINOGEN: Fibrinogen: 578 mg/dL — ABNORMAL HIGH (ref 210–475)
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
## 2024-03-25 NOTE — MAU Provider Note (Addendum)
 Chief Complaint:  Fall   HPI   Jessica [name] is a 28 y.o. G1P0000 at [redacted]w[redacted]d who presents to maternity admissions reporting fall with hitting her abdomen. She fell this morning 9/2 ~8am; tripped on a curb. Had increased cramping following this; has improved somewhat following and now has 1-2 episodes an hour. No syncope/presyncope. Will occasionally have elevated HR (sees cardiology Friday) since an accident mid-July 2025; this has not changed since her fall this morning. No abdominal pain.  No vaginal bleeding, no leakage of fluids. Still feeling baby move; had slowed down immediatly after the fall, now back to normal movement level.  No palpitations. No dizziness, light headedness, increased fatigue.  Mother present to provide support.  Pregnancy Course: Receives care at [facility]. Prenatal records reviewed. Positive for GDM, treated with lifestyle interventions/food diary and regular glucose checks. Most recent glucose of 80 per patient's monitor. US  12/2023 with posterior placenta with anterior succenturiate lobe.  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   Syncope 09/22/2014   Vasovagal syncope    OB History  Gravida Para Term Preterm AB Living  1 0 0 0 0 0  SAB IAB Ectopic Multiple Live Births  0 0 0 0 0    # Outcome Date GA Lbr Len/2nd Weight Sex Type Anes PTL Lv  1 Current            Past Surgical History:  Procedure Laterality Date   ANTERIOR CRUCIATE LIGAMENT REPAIR  03/2010   EYE SURGERY  2000   clogged tear duct   WISDOM TOOTH EXTRACTION  2015   Family History  Problem Relation Age of Onset   Migraines Mother        Started 4th or 5th grade   Seizures Mother        Febrile  Seizures as a child   Seizures Father        Febrile Seizures as a child   Hypertension Father    Kidney Stones Father    Migraines Maternal Aunt    Migraines Maternal Uncle    Diabetes Maternal Grandmother    Stroke Maternal Grandfather        mini-stroke   Cancer Maternal Grandfather        Bladder cancer, Died at 72   Diabetes Maternal Grandfather    Mental retardation Other        Maternal Second Cousin   Other Other        Maternal Great Uncle had some sort of Retinal Vessel Occlusion   Breast cancer Maternal Aunt    Cancer Maternal Aunt        Peritoneal CA   Eating disorder Maternal Aunt    Depression Other        Father's side of the family   Colon cancer Neg Hx    Social History   Tobacco Use   Smoking status: Never   Smokeless tobacco: Never  Vaping Use   Vaping status: Never Used  Substance Use Topics   Alcohol use: Yes    Comment: occ   Drug use: No   Allergies  Allergen Reactions   Amoxicillin Rash  and Dermatitis   Doxycycline  Other (See Comments) and Dermatitis    Possible cause of rash   Maxalt  [Rizatriptan ]     Chest Tightness   Medications Prior to Admission  Medication Sig Dispense Refill Last Dose/Taking   FLUoxetine  (PROZAC ) 40 MG capsule Take 1 capsule (40 mg total) by mouth daily. 90 capsule 3 03/24/2024 Evening   Prenatal Vit-Fe Fumarate-FA (PRENATAL MULTIVITAMIN) TABS tablet Take 1 tablet by mouth daily at 12 noon.   03/24/2024 Evening   acetaminophen  (TYLENOL ) 325 MG tablet Take 2 tablets (650 mg total) by mouth every 4 (four) hours as needed (for pain scale < 4  OR  temperature  >/=  100.5 F). 30 tablet 0 More than a month   ondansetron  (ZOFRAN -ODT) 4 MG disintegrating tablet Take 1-2 tablets (4-8 mg total) by mouth every 8 (eight) hours as needed for nausea. 30 tablet 11 More than a month    I have reviewed patient's Past Medical Hx, Surgical Hx, Family Hx, Social Hx, medications and allergies.   ROS  Pertinent items noted in HPI and  remainder of comprehensive ROS otherwise negative.   PHYSICAL EXAM  Patient Vitals for the past 24 hrs:  BP Temp Temp src Pulse Resp SpO2 Height Weight  03/25/24 1705 121/80 -- -- (!) 111 -- -- -- --  03/25/24 1700 -- -- -- -- -- 98 % -- --  03/25/24 1655 -- -- -- -- -- 99 % -- --  03/25/24 1650 -- -- -- -- -- 98 % -- --  03/25/24 1645 -- -- -- -- -- 99 % -- --  03/25/24 1640 -- -- -- -- -- 99 % -- --  03/25/24 1635 -- -- -- -- -- 99 % -- --  03/25/24 1630 -- -- -- -- -- 99 % -- --  03/25/24 1625 -- -- -- -- -- 99 % -- --  03/25/24 1620 -- -- -- -- -- 98 % -- --  03/25/24 1615 -- -- -- -- -- 98 % -- --  03/25/24 1610 -- -- -- -- -- 98 % -- --  03/25/24 1605 -- -- -- -- -- 98 % -- --  03/25/24 1600 -- -- -- -- -- 99 % -- --  03/25/24 1555 -- -- -- -- -- 99 % -- --  03/25/24 1550 -- -- -- -- -- 99 % -- --  03/25/24 1545 -- -- -- -- -- 99 % -- --  03/25/24 1540 -- -- -- -- -- 99 % -- --  03/25/24 1535 -- -- -- -- -- 99 % -- --  03/25/24 1530 -- -- -- -- -- 99 % -- --  03/25/24 1525 -- -- -- -- -- 98 % -- --  03/25/24 1520 -- -- -- -- -- 97 % -- --  03/25/24 1515 -- -- -- -- -- 97 % -- --  03/25/24 1510 -- -- -- -- -- 99 % -- --  03/25/24 1505 -- -- -- -- -- 99 % -- --  03/25/24 1500 -- -- -- -- -- 99 % -- --  03/25/24 1455 -- -- -- -- -- 97 % -- --  03/25/24 1450 -- -- -- -- -- 97 % -- --  03/25/24 1345 -- -- -- -- -- 98 % -- --  03/25/24 1340 -- -- -- -- -- 99 % -- --  03/25/24 1335 -- -- -- -- -- 97 % -- --  03/25/24 1330 -- -- -- -- -- 97 % -- --  03/25/24  1325 -- -- -- -- -- 97 % -- --  03/25/24 1320 -- -- -- -- -- 98 % -- --  03/25/24 1315 -- -- -- -- -- 98 % -- --  03/25/24 1310 132/72 -- -- 100 -- -- -- --  03/25/24 1246 122/82 98.2 F (36.8 C) Oral (!) 120 18 98 % -- --  03/25/24 1243 -- -- -- -- -- -- 5' 6 (1.676 m) 88.7 kg    Constitutional: Well-developed, well-nourished female in no acute distress.  HEENT: atraumatic, normocephalic. Neck has normal ROM. EOM  intact. Cardiovascular: normal rate & rhythm, warm and well-perfused Respiratory: normal effort, no problems with respiration noted GI: Abd soft, non-tender. Gravid MSK: Extremities nontender, no edema, normal ROM Skin: warm and dry. Acyanotic, no jaundice or pallor. No bruising. Neurologic: Alert and oriented x 4. No abnormal coordination. Psychiatric: Normal mood. Speech not slurred, not rapid/pressured. Patient is cooperative. GU: no CVA tenderness Pelvic: NEFG; speculum exam performed: physiologic discharge, no blood, cervix closed. Small vesicular lesions around cervix. Exam performed with chaperone present.   Fetal Tracing: Baseline FHR: 130 per minute Fetal heart variability: moderate Fetal Heart Rate accelerations: yes Fetal Heart Rate decelerations: none Fetal Non-stress Test: Category I (reactive) Toco: uterine irritability, rare irregular uterine contractions not felt by patient  Labs: Results for orders placed or performed during the hospital encounter of 03/25/24 (from the past 24 hours)  CBC     Status: Abnormal   Collection Time: 03/25/24  1:28 PM  Result Value Ref Range   WBC 10.6 (H) 4.0 - 10.5 K/uL   RBC 4.25 3.87 - 5.11 MIL/uL   Hemoglobin 13.4 12.0 - 15.0 g/dL   HCT 60.0 63.9 - 53.9 %   MCV 93.9 80.0 - 100.0 fL   MCH 31.5 26.0 - 34.0 pg   MCHC 33.6 30.0 - 36.0 g/dL   RDW 86.3 88.4 - 84.4 %   Platelets 254 150 - 400 K/uL   nRBC 0.0 0.0 - 0.2 %  Fibrinogen  (coagulopathy lab panel)     Status: Abnormal   Collection Time: 03/25/24  1:28 PM  Result Value Ref Range   Fibrinogen  578 (H) 210 - 475 mg/dL  Protime-INR (coagulopathy lab panel)     Status: None   Collection Time: 03/25/24  1:28 PM  Result Value Ref Range   Prothrombin Time 14.1 11.4 - 15.2 seconds   INR 1.0 0.8 - 1.2  APTT (coagulopathy lab panel)     Status: None   Collection Time: 03/25/24  1:28 PM  Result Value Ref Range   aPTT 28 24 - 36 seconds    Imaging:  No results found. OB US   pending  MDM & MAU COURSE  MDM: High  MAU Course: Differential diagnosis considered for fall includes mechanical, presyncope/syncope, arrhythmia, hypoglycemia. Suspect mechanical fall. Pt reports glucose level of 80 per her monitor, no concern for hypoglycemia at this time. No presyncope/syncope per patient. Intermittent rapid heart beat awaiting cardiac evaluation later this week; no change following fall this morning.  Low concern for placental abruption at this time given stable FHR monitoring and lack of significant abdominal pain with no vaginal bleeding. Official US  read still pending, but personal review without evidence of abruption.  Cervical lesions could be HSV vs other STI/STD; recommending further outpatient evaluation.   Orders Placed This Encounter  Procedures   US  MFM OB LIMITED   CBC   Fibrinogen  (coagulopathy lab panel)   Protime-INR (coagulopathy lab panel)   APTT (  coagulopathy lab panel)   Discharge patient   No orders of the defined types were placed in this encounter.   ASSESSMENT   1. Fall, initial encounter   2. Lesion of cervix   3. [redacted] weeks gestation of pregnancy     PLAN  Discharge home in stable condition with return precautions.   Recommended to get HSV testing done outpatient given cervical findings on speculum exam; consider other STI/STD testing.     Allergies as of 03/25/2024       Reactions   Amoxicillin Rash, Dermatitis   Doxycycline  Other (See Comments), Dermatitis   Possible cause of rash   Maxalt  [rizatriptan ]    Chest Tightness        Medication List     TAKE these medications    acetaminophen  325 MG tablet Commonly known as: TYLENOL  Take 2 tablets (650 mg total) by mouth every 4 (four) hours as needed (for pain scale < 4  OR  temperature  >/=  100.5 F).   FLUoxetine  40 MG capsule Commonly known as: PROZAC  Take 1 capsule (40 mg total) by mouth daily.   ondansetron  4 MG disintegrating tablet Commonly known as:  ZOFRAN -ODT Take 1-2 tablets (4-8 mg total) by mouth every 8 (eight) hours as needed for nausea.   prenatal multivitamin Tabs tablet Take 1 tablet by mouth daily at 12 noon.        Alan Flies, MD

## 2024-03-25 NOTE — Discharge Instructions (Signed)
 Jessica [name],  You came to the MAU (Maternity Assessment Unit) today for a fall with resulting hit to abdomen. We monitored your baby's heart rate for 4 hours total and it was reassuringly normal throughout this time. We also got an ultrasound to look at your baby and placenta, both of which were normal. We checked your blood counts and clotting factors, all of which were normal for someone in the third trimester of pregnancy.  I did see some small changes on your cervix, which you should be seen by your outpatient provider about; there was nothing emergent that needed to be done today for this.  Reasons to return to the MAU: - You start to experience abdominal pain/contractions (squeezing sensation) - You experience vaginal bleeding or leakage of fluids - You do not feel your baby moving as much or at all - You develop a fever (> 100.83F or 38C)  Thank you for allowing me to be a part of your care! Alan Flies, MD [facility], PGY1 [facility] at [facility] [address] [place], [place] [address]

## 2024-03-26 ENCOUNTER — Encounter (HOSPITAL_COMMUNITY): Payer: Self-pay | Admitting: Obstetrics and Gynecology

## 2024-03-26 ENCOUNTER — Inpatient Hospital Stay (HOSPITAL_BASED_OUTPATIENT_CLINIC_OR_DEPARTMENT_OTHER)

## 2024-03-26 ENCOUNTER — Inpatient Hospital Stay (HOSPITAL_COMMUNITY)
Admission: AD | Admit: 2024-03-26 | Discharge: 2024-03-27 | Disposition: A | Discharge status: Home / Self Care | Attending: Obstetrics and Gynecology | Admitting: Obstetrics and Gynecology

## 2024-03-26 DIAGNOSIS — O9A213 Injury, poisoning and certain other consequences of external causes complicating pregnancy, third trimester: Secondary | ICD-10-CM

## 2024-03-26 DIAGNOSIS — O26893 Other specified pregnancy related conditions, third trimester: Secondary | ICD-10-CM | POA: Insufficient documentation

## 2024-03-26 DIAGNOSIS — W19XXXA Unspecified fall, initial encounter: Secondary | ICD-10-CM | POA: Insufficient documentation

## 2024-03-26 DIAGNOSIS — Z369 Encounter for antenatal screening, unspecified: Secondary | ICD-10-CM | POA: Insufficient documentation

## 2024-03-26 DIAGNOSIS — O24410 Gestational diabetes mellitus in pregnancy, diet controlled: Secondary | ICD-10-CM | POA: Insufficient documentation

## 2024-03-26 DIAGNOSIS — O4693 Antepartum hemorrhage, unspecified, third trimester: Secondary | ICD-10-CM | POA: Diagnosis not present

## 2024-03-26 DIAGNOSIS — O368130 Decreased fetal movements, third trimester, not applicable or unspecified: Secondary | ICD-10-CM

## 2024-03-26 DIAGNOSIS — R030 Elevated blood-pressure reading, without diagnosis of hypertension: Secondary | ICD-10-CM | POA: Insufficient documentation

## 2024-03-26 DIAGNOSIS — Z3A35 35 weeks gestation of pregnancy: Secondary | ICD-10-CM | POA: Insufficient documentation

## 2024-03-26 DIAGNOSIS — O99213 Obesity complicating pregnancy, third trimester: Secondary | ICD-10-CM | POA: Insufficient documentation

## 2024-03-26 LAB — KLEIHAUER-BETKE STAIN
Fetal Cells %: 0 %
Quantitation Fetal Hemoglobin: 0 mL

## 2024-03-26 LAB — WET PREP, GENITAL
Clue Cells Wet Prep HPF POC: NONE SEEN
Sperm: NONE SEEN
Trich, Wet Prep: NONE SEEN
WBC, Wet Prep HPF POC: >=10 — AB (ref <10)
Yeast Wet Prep HPF POC: NONE SEEN

## 2024-03-26 LAB — FIBRINOGEN: Fibrinogen: 643 mg/dL — ABNORMAL HIGH (ref 210–475)

## 2024-03-26 LAB — COMPREHENSIVE METABOLIC PANEL WITH GFR
ALT: 13 U/L (ref 0–44)
AST: 16 U/L (ref 15–41)
Albumin: 2.8 g/dL — ABNORMAL LOW (ref 3.5–5.0)
Alkaline Phosphatase: 114 U/L (ref 38–126)
Anion gap: 11 (ref 5–15)
BUN: 9 mg/dL (ref 6–20)
CO2: 18 mmol/L — ABNORMAL LOW (ref 22–32)
Calcium: 9.1 mg/dL (ref 8.9–10.3)
Chloride: 106 mmol/L (ref 98–111)
Creatinine, Ser: 0.67 mg/dL (ref 0.44–1.00)
GFR, Estimated: >60 mL/min (ref >60)
Glucose, Bld: 97 mg/dL (ref 70–99)
Potassium: 3.7 mmol/L (ref 3.5–5.1)
Sodium: 135 mmol/L (ref 135–145)
Total Bilirubin: 0.7 mg/dL (ref 0.0–1.2)
Total Protein: 6.2 g/dL — ABNORMAL LOW (ref 6.5–8.1)

## 2024-03-26 LAB — CBC
HCT: 39.2 % (ref 36.0–46.0)
Hemoglobin: 13.3 g/dL (ref 12.0–15.0)
MCH: 31.1 pg (ref 26.0–34.0)
MCHC: 33.9 g/dL (ref 30.0–36.0)
MCV: 91.6 fL (ref 80.0–100.0)
Platelets: 260 K/uL (ref 150–400)
RBC: 4.28 MIL/uL (ref 3.87–5.11)
RDW: 13.5 % (ref 11.5–15.5)
WBC: 10.8 K/uL — ABNORMAL HIGH (ref 4.0–10.5)
nRBC: 0 % (ref 0.0–0.2)

## 2024-03-26 LAB — APTT: aPTT: 26 s (ref 24–36)

## 2024-03-26 LAB — PROTEIN / CREATININE RATIO, URINE
Creatinine, Urine: 70 mg/dL
Protein Creatinine Ratio: 0.17 mg/mg{creat} — ABNORMAL HIGH (ref 0.00–0.15)
Total Protein, Urine: 12 mg/dL

## 2024-03-26 LAB — PROTIME-INR
INR: 1 (ref 0.8–1.2)
Prothrombin Time: 13.7 s (ref 11.4–15.2)

## 2024-03-26 LAB — TYPE AND SCREEN
ABO/RH(D): B POS
Antibody Screen: NEGATIVE

## 2024-03-26 LAB — GLUCOSE, CAPILLARY: Glucose-Capillary: 84 mg/dL (ref 70–99)

## 2024-03-26 MED ORDER — DOCUSATE SODIUM 100 MG PO CAPS: 100.0000 mg | ORAL_CAPSULE | Freq: Every day | ORAL | Status: DC

## 2024-03-26 MED ORDER — PRENATAL MULTIVITAMIN CH: 1.0000 | ORAL_TABLET | Freq: Every day | ORAL | Status: DC

## 2024-03-26 MED ORDER — CALCIUM CARBONATE ANTACID 500 MG PO CHEW: 2.0000 | CHEWABLE_TABLET | ORAL | Status: DC | PRN

## 2024-03-26 MED ORDER — ACETAMINOPHEN 325 MG PO TABS: 650.0000 mg | ORAL_TABLET | ORAL | Status: DC | PRN

## 2024-03-26 MED ORDER — LACTATED RINGERS IV SOLN: 125.0000 mL/h | INTRAVENOUS | Status: DC

## 2024-03-26 NOTE — MAU Provider Note (Signed)
 Chief Complaint:  Vaginal Bleeding   HPI    Jessica [name] is a 28 y.o. G1P0000 at [redacted]w[redacted]d who presents to maternity admissions reporting she noticed some vaginal bleeding this morning along with decreased fetal movement although she reports she is feeling the baby move.  Of note patient was seen yesterday in the MAU due to a fall approximately 8 AM she tripped on a curb and had a direct hit to the abdomen she was monitored for 4 hours and MAU and fully evaluated and sent home with strict precautions.   She denies any leaking of fluid but is still reporting mild abdominal cramping  Patient's pregnancy is complicated by GDM, diet-controlled at this time and intermittent tachycardia as scheduled cardiology consult upcoming.  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   Syncope 09/22/2014   Vasovagal syncope    OB History  Gravida Para Term Preterm AB Living  1 0 0 0 0 0  SAB IAB Ectopic Multiple Live Births  0 0 0 0 0    # Outcome Date GA Lbr Len/2nd Weight Sex Type Anes PTL Lv  1 Current            Past Surgical History:  Procedure Laterality Date   ANTERIOR CRUCIATE LIGAMENT REPAIR  03/2010   EYE SURGERY  2000   clogged tear duct   WISDOM TOOTH EXTRACTION  2015   Family History  Problem Relation Age of Onset   Migraines Mother        Started 4th or 5th grade   Seizures Mother        Febrile Seizures as a child   Seizures Father        Febrile Seizures as a child   Hypertension Father    Kidney Stones Father    Migraines Maternal Aunt    Migraines Maternal Uncle    Diabetes Maternal Grandmother    Stroke Maternal Grandfather        mini-stroke   Cancer  Maternal Grandfather        Bladder cancer, Died at 69   Diabetes Maternal Grandfather    Mental retardation Other        Maternal Second Cousin   Other Other        Maternal Great Uncle had some sort of Retinal Vessel Occlusion   Breast cancer Maternal Aunt    Cancer Maternal Aunt        Peritoneal CA   Eating disorder Maternal Aunt    Depression Other        Father's side of the family   Colon cancer Neg Hx    Social History   Tobacco Use   Smoking status: Never   Smokeless tobacco: Never  Vaping Use   Vaping status: Never Used  Substance Use Topics   Alcohol use: Not Currently    Comment: occ   Drug use: No   Allergies  Allergen Reactions   Amoxicillin Rash and Dermatitis   Doxycycline  Other (See Comments) and Dermatitis    Possible cause of rash   Maxalt  [Rizatriptan ]     Chest Tightness  Medications Prior to Admission  Medication Sig Dispense Refill Last Dose/Taking   FLUoxetine  (PROZAC ) 40 MG capsule Take 1 capsule (40 mg total) by mouth daily. 90 capsule 3 03/25/2024   Prenatal Vit-Fe Fumarate-FA (PRENATAL MULTIVITAMIN) TABS tablet Take 1 tablet by mouth daily at 12 noon.   03/25/2024   acetaminophen  (TYLENOL ) 325 MG tablet Take 2 tablets (650 mg total) by mouth every 4 (four) hours as needed (for pain scale < 4  OR  temperature  >/=  100.5 F). 30 tablet 0    ondansetron  (ZOFRAN -ODT) 4 MG disintegrating tablet Take 1-2 tablets (4-8 mg total) by mouth every 8 (eight) hours as needed for nausea. 30 tablet 11     I have reviewed patient's Past Medical Hx, Surgical Hx, Family Hx, Social Hx, medications and allergies.   ROS  Pertinent items noted in HPI and remainder of comprehensive ROS otherwise negative.   PHYSICAL EXAM  Patient Vitals for the past 24 hrs:  BP Temp Temp src Pulse Resp SpO2  03/26/24 1415 121/77 -- -- 99 -- 98 %  03/26/24 1400 (!) 117/93 -- -- (!) 115 -- 97 %  03/26/24 1345 132/79 -- -- (!) 112 -- 97 %  03/26/24 1330 120/89 -- -- (!) 111 -- 96 %   03/26/24 1315 (!) 130/90 -- -- (!) 120 -- 97 %  03/26/24 1300 (!) 125/90 -- -- (!) 115 -- 97 %  03/26/24 1255 120/85 -- -- (!) 116 -- 98 %  03/26/24 1229 (!) 142/90 98.6 F (37 C) Oral (!) 125 17 --    Constitutional: Well-developed, well-nourished female in no acute distress.  Cardiovascular: Tachardia noted with MRBP  Respiratory: normal effort, no problems with respiration noted GI: Abd soft, non-tender, gravid MS: Extremities nontender, no edema, normal ROM Neurologic: Alert and oriented x 4.  GU: no CVA tenderness Pelvic: Exam chaperoned by Silvano Font, RN  NEFG, scant vaginal bleeding noted with touch to the cervix, no pooling, cervix visually closed  Dilation: 1 Effacement (%): 50 Station: Ballotable Exam by:: Olam Dalton, NP  Fetal Tracing: Cat 1 @ 1301 Baseline:130-135 Variability: moderate Accelerations: present Decelerations: absent Toco: no ctx   Labs: Results for orders placed or performed during the hospital encounter of 03/26/24 (from the past 24 hours)  Wet prep, genital     Status: Abnormal   Collection Time: 03/26/24 12:46 PM   Specimen: Vaginal  Result Value Ref Range   Yeast Wet Prep HPF POC NONE SEEN NONE SEEN   Trich, Wet Prep NONE SEEN NONE SEEN   Clue Cells Wet Prep HPF POC NONE SEEN NONE SEEN   WBC, Wet Prep HPF POC >=10 (A) <10   Sperm NONE SEEN   Protein / creatinine ratio, urine     Status: Abnormal   Collection Time: 03/26/24 12:52 PM  Result Value Ref Range   Creatinine, Urine 70 mg/dL   Total Protein, Urine 12 mg/dL   Protein Creatinine Ratio 0.17 (H) 0.00 - 0.15 mg/mg[Cre]  CBC     Status: Abnormal   Collection Time: 03/26/24  1:48 PM  Result Value Ref Range   WBC 10.8 (H) 4.0 - 10.5 K/uL   RBC 4.28 3.87 - 5.11 MIL/uL   Hemoglobin 13.3 12.0 - 15.0 g/dL   HCT 60.7 63.9 - 53.9 %   MCV 91.6 80.0 - 100.0 fL   MCH 31.1 26.0 - 34.0 pg   MCHC 33.9 30.0 - 36.0 g/dL   RDW 86.4 88.4 - 84.4 %  Platelets 260 150 - 400 K/uL   nRBC 0.0  0.0 - 0.2 %  Comprehensive metabolic panel     Status: Abnormal   Collection Time: 03/26/24  1:48 PM  Result Value Ref Range   Sodium 135 135 - 145 mmol/L   Potassium 3.7 3.5 - 5.1 mmol/L   Chloride 106 98 - 111 mmol/L   CO2 18 (L) 22 - 32 mmol/L   Glucose, Bld 97 70 - 99 mg/dL   BUN 9 6 - 20 mg/dL   Creatinine, Ser 9.32 0.44 - 1.00 mg/dL   Calcium  9.1 8.9 - 10.3 mg/dL   Total Protein 6.2 (L) 6.5 - 8.1 g/dL   Albumin 2.8 (L) 3.5 - 5.0 g/dL   AST 16 15 - 41 U/L   ALT 13 0 - 44 U/L   Alkaline Phosphatase 114 38 - 126 U/L   Total Bilirubin 0.7 0.0 - 1.2 mg/dL   GFR, Estimated >39 >39 mL/min   Anion gap 11 5 - 15  Glucose, capillary     Status: None   Collection Time: 03/26/24  2:49 PM  Result Value Ref Range   Glucose-Capillary 84 70 - 99 mg/dL    Imaging:  No results found.   MDM & MAU COURSE  MDM:   HIGH   Vaginal bleeding at 35 weeks 2 days status post fall on 03/25/2024 Prenatal records reviewed Physical exam performed with pelvic Vaginal cultures: wet prep unremarkable CBC: unremarkable CMP: unremarkable PC ratio: 0.17 EKG for maternal tachycardia: Sinus tachycardia  Limited MFM ultrasound ordered for bleeding in third trimester s/p Fall (ultrasound images and report from 9/2 reviewed )  CBC, CMP, PC ratio ordered for elevated blood pressures while in MAU.  Blood pressures are in mild range.  Rule out gestational hypertension versus preeclampsia  D/W Dr Herchel ( recommendations are for 24 hour observation at this time. D/W Dr Lequita [facility] Private Attending who is in agreement)  Patient aware and agreeable with plan at this time    MAU Course: Orders Placed This Encounter  Procedures   Wet prep, genital   US  MFM OB Limited   US  MFM FETAL BPP WO NON STRESS   CBC   Comprehensive metabolic panel   Protein / creatinine ratio, urine   Glucose, capillary   Fibrinogen    Protime-INR   APTT   Kleihauer-Betke stain   Diet regular Room service appropriate?  Yes; Fluid consistency: Thin   Notify physician (specify)   Vital signs   Defer vaginal exam for vaginal bleeding or PROM <37 weeks   Apply Antepartum Care Plan   Initiate Oral Care Protocol   Initiate Carrier Fluid Protocol   SCDs   Fetal monitoring   Full code   EKG 12-Lead   Type and screen  [facility]   Meds ordered this encounter  Medications   lactated ringers  infusion   acetaminophen  (TYLENOL ) tablet 650 mg   docusate sodium  (COLACE) capsule 100 mg   calcium  carbonate (TUMS - dosed in mg elemental calcium ) chewable tablet 400 mg of elemental calcium    prenatal multivitamin tablet 1 tablet    I have reviewed the patient chart and performed the physical exam . I have ordered & interpreted the lab results and reviewed and interpreted the NST Medications ordered as stated below.  A/P as described below.  Counseling and education provided and patient agreeable  with plan as described below. Verbalized understanding.    ASSESSMENT   1. Vaginal bleeding in  pregnancy, third trimester   2. Traumatic injury during pregnancy in third trimester   3. Elevated blood pressure reading without diagnosis of hypertension   4. Decreased fetal movements in third trimester, single or unspecified fetus     PLAN  Admit to OBS for Observation S/P Trauma in pregnancy with vaginal bleeding and DFM Orders to be placed by Dr Lequita ( OB private Attending) ----------------------------------------------------------------------------------------------- Olam Dalton, MSN, [facility], [facility]    This chart was dictated using voice recognition software, Dragon. Despite the best efforts of this provider to proofread and correct errors, errors may still occur which can change documentation meaning.

## 2024-03-26 NOTE — MAU Note (Signed)
.  Jessica [name] is a 28 y.o. at [redacted]w[redacted]d here in MAU reporting: Around 0800 this morning she noticed a small amount of dark red vaginal spotting. She was seen here yesterday s/p fall where she had direct abdominal trauma and did have a cervical exam (was closed). Denies LOF. Does report DFM since she fell yesterday. Also having some lower abdominal cramping since the fall as well.   Pain score: 3 Vitals:   03/26/24 1229  BP: (!) 142/90  Pulse: (!) 125  Resp: 17  Temp: 98.6 F (37 C)     FHT:142 Lab orders placed from triage: UA

## 2024-03-26 NOTE — H&P (Signed)
 Antepartum History and Physical   Jessica [name] is a 28 y.o. female G1P0 that presented for decreased fetal movement, vaginal bleeding in the setting of a recent fall.  She was recently seen at [facility] yesterday following a trip on a curb and fall around 8 AM. She did have some abdominal contact. She was monitored in [facility]U for several hours and underwent OB US .  Pregnancy course is notable for succenturiate placenta lobe, A1GDM, well controlled, and intermittent tachycardia seen by cardiology.   While in [facility]U, she was noted to have elevated blood pressures in the mild range. She denies headache, vision change, RUQ or epigastric pain.   OB History     Gravida  1   Para  0   Term  0   Preterm  0   AB  0   Living  0      SAB  0   IAB  0   Ectopic  0   Multiple  0   Live Births  0          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   Syncope 09/22/2014   Vasovagal syncope    Past Surgical History:  Procedure Laterality Date   ANTERIOR CRUCIATE LIGAMENT REPAIR  03/2010   EYE SURGERY  2000   clogged tear duct   WISDOM TOOTH EXTRACTION  2015   Family History: family history includes Breast cancer in her maternal aunt; Cancer in her maternal aunt and maternal grandfather; Depression in an other family member; Diabetes in her maternal grandfather and maternal grandmother; Eating disorder in her maternal aunt; Hypertension in her father; Kidney Stones in her father; Mental retardation in an other family member; Migraines in her maternal aunt, maternal uncle, and mother; Other in an other family member; Seizures in her father and mother; Stroke in her maternal grandfather. Social History:  reports that she  has never smoked. She has never used smokeless tobacco. She reports that she does not currently use alcohol. She reports that she does not use drugs.     Maternal Diabetes: A1GDM Genetic Screening: Normal Maternal Ultrasounds/Referrals: Normal Fetal Ultrasounds or other Referrals:  Succenturiate lobe Maternal Substance Abuse:  No Significant Maternal Medications:  Fluoxetine  Significant Maternal Lab Results:  None Other Comments:  None  Review of Systems History Dilation: 1 Effacement (%): 50 Station: Ballotable Exam by:: Jessica Dalton, NP Blood pressure 121/77, pulse 99, temperature 98.6 F (37 C), temperature source Oral, resp. rate 17, last menstrual period 07/23/2023, SpO2 98%. Exam Physical Exam   Gen: alert, well appearing, no distress Chest: nonlabored breathing CV: no peripheral edema Abdomen: soft, nontender Ext: no evidence of DVT   Prenatal labs: ABO, Rh: --/--/PENDING (09/03 1513) Antibody: PENDING (09/03 1513) Rubella: Immune (02/20 0000) RPR: Nonreactive (02/20 0000)  HBsAg: Negative (02/20 0000)  HIV: Non-reactive (02/20 0000)  GBS:     Assessment/Plan: Admit to [facility] for observation, s/p fall, decreased fetal movement, bleeding that has since improved since initial episode HGB 13.3.  Coags reassuring, fibrinogen  643.  Elevated BP, new onset Will continue to monitor, if criteria met for gHTN, will plan for delivery as indicated.  HELLP labs WNL, P/C ratio 0.17 Decreased fetal movement NST reactive and reassuring, BPP 8/8, however subjectively less active of BPP as compared to yesterday's study by the same sonographer Breech presentation noted, plan for Cesarean delivery in the event of delivery indication Continuous EFM and toco BMZ deferred given no indications for imminent delivery and gestational age over 79 weeks, also GDM Diet: Antenatal DVT Ppx: SCDs GBS ordered Repeat OB US  read pending Dispo: Plan for overnight observation,  consider discharge if bleeding resolves, fetal movement sense improves, and monitoring is reassuring.    Jessica [name] 03/26/2024, 3:56 PM

## 2024-03-27 ENCOUNTER — Encounter (HOSPITAL_COMMUNITY): Payer: Self-pay | Admitting: Obstetrics and Gynecology

## 2024-03-27 LAB — GC/CHLAMYDIA PROBE AMP (~~LOC~~) NOT AT ARMC
Chlamydia: NEGATIVE
Comment: NEGATIVE
Comment: NORMAL
Neisseria Gonorrhea: NEGATIVE

## 2024-03-27 MED ORDER — LACTATED RINGERS IV BOLUS
500.0000 mL | Freq: Once | INTRAVENOUS | Status: AC
  Administered 2024-03-27: 500 mL via INTRAVENOUS

## 2024-03-27 NOTE — Discharge Summary (Addendum)
 Physician Discharge Summary  Patient ID: Jessica [name] MRN: [id number] DOB/AGE: 02-16-1996 28 y.o.  Admit date: 03/26/2024 Discharge date: 03/27/2024  Admission Diagnoses:  Discharge Diagnoses:  Active Problems:   * No active hospital problems. *   Discharged Condition: good  Hospital Course: 28 yo G1P0 admitted at 47.2 wga for observation. Clemens a few days ago and was monitored and discharged. However, then re-presented with vaginal bleeding and decreased FM. She has a known succinturiate placental lobe. She had BPP 8/8 but was admitted for overnight observation given bleeding and mild cramping. The bleeding and cramping resolved and the patient was ready to be discharged the next AM without problems. She did have a few mild range BP elevations in the MAU that resolved. CEFM was reassuring.   Consults: None  Significant Diagnostic Studies: labs: CBC    Component Value Date/Time   WBC 10.8 (H) 03/26/2024 1348   RBC 4.28 03/26/2024 1348   HGB 13.3 03/26/2024 1348   HGB 12.9 11/30/2016 1657   HCT 39.2 03/26/2024 1348   HCT 40.9 11/30/2016 1657   PLT 260 03/26/2024 1348   PLT 320 11/30/2016 1657   MCV 91.6 03/26/2024 1348   MCV 94 11/30/2016 1657   MCH 31.1 03/26/2024 1348   MCHC 33.9 03/26/2024 1348   RDW 13.5 03/26/2024 1348   RDW 12.6 11/30/2016 1657   LYMPHSABS 3.1 02/09/2024 2149   LYMPHSABS 2.1 11/30/2016 1657   MONOABS 0.9 02/09/2024 2149   EOSABS 0.1 02/09/2024 2149   EOSABS 0.2 11/30/2016 1657   BASOSABS 0.1 02/09/2024 2149   BASOSABS 0.1 11/30/2016 1657    and BPP 8/8  Treatments: IV hydration  Discharge Exam: Blood pressure 118/80, pulse (!) 121, temperature 97.8 F (36.6 C), temperature source Oral, resp. rate 17, height 5' 6 (1.676 m), weight 88.7 kg, last menstrual period 07/23/2023, SpO2 98%.  NAD, A&O NWOB Abd soft, nondistended, gravid NO bleeding.   Disposition: Discharge disposition: 01-Home or Self Care       Discharge Instructions      Discharge activity:  No Restrictions   Complete by: As directed    Discharge diet:  No restrictions   Complete by: As directed    Do not have sex or do anything that might make you have an orgasm   Complete by: As directed    Fetal Kick Count:  Lie on our left side for one hour after a meal, and count the number of times your baby kicks.  If it is less than 5 times, get up, move around and drink some juice.  Repeat the test 30 minutes later.  If it is still less than 5 kicks in an hour, notify your doctor.   Complete by: As directed    Notify physician for a general feeling that something is not right   Complete by: As directed    Notify physician for increase or change in vaginal discharge   Complete by: As directed    Notify physician for intestinal cramps, with or without diarrhea, sometimes described as gas pain   Complete by: As directed    Notify physician for leaking of fluid   Complete by: As directed    Notify physician for low, dull backache, unrelieved by heat or Tylenol    Complete by: As directed    Notify physician for menstrual like cramps   Complete by: As directed    Notify physician for pelvic pressure   Complete by: As directed    Notify physician  for uterine contractions.  These may be painless and feel like the uterus is tightening or the baby is  balling up   Complete by: As directed    Notify physician for vaginal bleeding   Complete by: As directed    PRETERM LABOR:  Includes any of the follwing symptoms that occur between 20 - [redacted] weeks gestation.  If these symptoms are not stopped, preterm labor can result in preterm delivery, placing your baby at risk   Complete by: As directed       Allergies as of 03/27/2024       Reactions   Amoxicillin Rash, Dermatitis   Doxycycline  Other (See Comments), Dermatitis   Possible cause of rash   Maxalt  [rizatriptan ]    Chest Tightness        Medication List     TAKE these medications    acetaminophen   325 MG tablet Commonly known as: TYLENOL  Take 2 tablets (650 mg total) by mouth every 4 (four) hours as needed (for pain scale < 4  OR  temperature  >/=  100.5 F).   FLUoxetine  40 MG capsule Commonly known as: PROZAC  Take 1 capsule (40 mg total) by mouth daily.   ondansetron  4 MG disintegrating tablet Commonly known as: ZOFRAN -ODT Take 1-2 tablets (4-8 mg total) by mouth every 8 (eight) hours as needed for nausea.   prenatal multivitamin Tabs tablet Take 1 tablet by mouth daily at 12 noon.         Signed: Kelly Delon Milian 03/27/2024, 9:01 AM

## 2024-03-27 NOTE — Plan of Care (Signed)
  Problem: Education: Goal: Knowledge of disease or condition will improve Outcome: Adequate for Discharge Goal: Knowledge of the prescribed therapeutic regimen will improve Outcome: Adequate for Discharge Goal: Individualized Educational Video(s) Outcome: Adequate for Discharge   Problem: Clinical Measurements: Goal: Complications related to the disease process, condition or treatment will be avoided or minimized Outcome: Adequate for Discharge   Problem: Education: Goal: Knowledge of General Education information will improve Description: Including pain rating scale, medication(s)/side effects and non-pharmacologic comfort measures Outcome: Adequate for Discharge   Problem: Health Behavior/Discharge Planning: Goal: Ability to manage health-related needs will improve Outcome: Adequate for Discharge   Problem: Clinical Measurements: Goal: Ability to maintain clinical measurements within normal limits will improve Outcome: Adequate for Discharge Goal: Will remain free from infection Outcome: Adequate for Discharge Goal: Diagnostic test results will improve Outcome: Adequate for Discharge Goal: Respiratory complications will improve Outcome: Adequate for Discharge Goal: Cardiovascular complication will be avoided Outcome: Adequate for Discharge   Problem: Activity: Goal: Risk for activity intolerance will decrease Outcome: Adequate for Discharge   Problem: Nutrition: Goal: Adequate nutrition will be maintained Outcome: Adequate for Discharge   Problem: Coping: Goal: Level of anxiety will decrease Outcome: Adequate for Discharge   Problem: Elimination: Goal: Will not experience complications related to bowel motility Outcome: Adequate for Discharge Goal: Will not experience complications related to urinary retention Outcome: Adequate for Discharge   Problem: Pain Managment: Goal: General experience of comfort will improve and/or be controlled Outcome: Adequate for  Discharge   Problem: Safety: Goal: Ability to remain free from injury will improve Outcome: Adequate for Discharge   Problem: Skin Integrity: Goal: Risk for impaired skin integrity will decrease Outcome: Adequate for Discharge

## 2024-03-28 ENCOUNTER — Ambulatory Visit (INDEPENDENT_AMBULATORY_CARE_PROVIDER_SITE_OTHER): Admitting: Cardiology

## 2024-03-28 ENCOUNTER — Other Ambulatory Visit

## 2024-03-28 ENCOUNTER — Encounter: Payer: Self-pay | Admitting: Cardiology

## 2024-03-28 VITALS — BP 112/86 | HR 109 | Ht 66.0 in | Wt 194.3 lb

## 2024-03-28 DIAGNOSIS — R002 Palpitations: Secondary | ICD-10-CM | POA: Diagnosis not present

## 2024-03-28 DIAGNOSIS — Z3A35 35 weeks gestation of pregnancy: Secondary | ICD-10-CM

## 2024-03-28 DIAGNOSIS — G90A Postural orthostatic tachycardia syndrome (POTS): Secondary | ICD-10-CM | POA: Diagnosis not present

## 2024-03-28 NOTE — Patient Instructions (Addendum)
 Medication Instructions:  Your physician recommends that you continue on your current medications as directed. Please refer to the Current Medication list given to you today.  *If you need a refill on your cardiac medications before your next appointment, please call your pharmacy*   Testing/Procedures: ZIO XT- Long Term Monitor Instructions  Your physician has requested you wear a ZIO patch monitor for 14 days.  This is a single patch monitor. [employer] supplies one patch monitor per enrollment. Additional stickers are not available. Please do not apply patch if you will be having a Nuclear Stress Test,  Echocardiogram, Cardiac CT, MRI, or Chest Xray during the period you would be wearing the  monitor. The patch cannot be worn during these tests. You cannot remove and re-apply the  ZIO XT patch monitor.  Your ZIO patch monitor will be mailed 3 day USPS to your address on file. It may take 3-5 days  to receive your monitor after you have been enrolled.  Once you have received your monitor, please review the enclosed instructions. Your monitor  has already been registered assigning a specific monitor serial # to you.  Billing and Patient Assistance Program Information  We have supplied [employer] with any of your insurance information on file for billing purposes. [employer] offers a sliding scale Patient Assistance Program for patients that do not have  insurance, or whose insurance does not completely cover the cost of the ZIO monitor.  You must apply for the Patient Assistance Program to qualify for this discounted rate.  To apply, please call [employer] at [phone], select option 4, select option 2, ask to apply for  Patient Assistance Program. Mered[employer] will ask your household income, and how many people  are in your household. They will quote your out-of-pocket cost based on that information.  [employer] will also be able to set up a 82-month, interest-free payment plan if needed.  Applying  the monitor Do not shower for the first 24 hours. You may shower after the first 24 hours.  Press the button if you feel a symptom. You will hear a small click. Record Date, Time and  Symptom in the Patient Logbook.  When you are ready to remove the patch, follow instructions on the last 2 pages of Patient  Logbook. Stick patch monitor onto the last page of Patient Logbook.  Place Patient Logbook in the blue and white box. Use locking tab on box and tape box closed  securely. The blue and white box has prepaid postage on it. Please place it in the mailbox as  soon as possible. Your physician should have your test results approximately 7 days after the  monitor has been mailed back to [facility].  Call [facility] Customer Care at [phone] if you have questions regarding  your ZIO XT patch monitor. Call them immediately if you see an orange light blinking on your  monitor.  If your monitor falls off in less than 4 days, contact our Monitor department at [phone].  If your monitor becomes loose or falls off after 4 days call [employer] at [phone] for  suggestions on securing your monitor   Follow-Up: At [facility], you and your health needs are our priority.  As part of our continuing mission to provide you with exceptional heart care, our providers are all part of one team.  This team includes your primary Cardiologist (physician) and Advanced Practice Providers or APPs (Physician Assistants and Nurse Practitioners) who all work together to provide you with  the care you need, when you need it.  Your next appointment:   6 week(s) via MyChart  Provider:   Kardie Tobb, DO

## 2024-03-28 NOTE — Progress Notes (Signed)
 Cardio-Obstetrics Clinic  New Evaluation  Date:  03/29/2024   ID:  Jessica [name], DOB Mar 13, 1996, MRN [id number]  PCP:  Cleatus Arlyss RAMAN, MD   [facility] Providers Cardiologist:  Dub Huntsman, DO  Electrophysiologist:  None       Referring MD: Cleatus Arlyss RAMAN, MD   Chief Complaint:  I am having palpiations  History of Present Illness:    Jessica [name] is a 28 y.o. female [G1P0000] who is being seen today for the evaluation of palpations at the request of Cleatus Arlyss RAMAN, MD.    Medical hx of POTS who presents with elevated heart rate during pregnancy.  She experiences elevated heart rate ranging from 120 to 150 bpm, even at rest, with episodes lasting 30 minutes to an hour. Shortness of breath occurs when her heart rate reaches 140 to 150 bpm. She uses a watch to monitor her heart rate, which alerts her when it is too high.  She was diagnosed with POTS in high school via a tilt table test and discontinued beta blocker therapy 6-7 years ago. She has not experienced syncope in the past year and does not feel like she is going to pass out during these episodes.  Her family history is negative for heart rhythm issues such as atrial fibrillation.  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   FLUoxetine  (PROZAC ) 40 MG capsule Take 1 capsule (40 mg total) by mouth daily.   ondansetron  (ZOFRAN -ODT) 4 MG disintegrating tablet Take 1-2 tablets (4-8 mg total) by mouth every 8 (eight) hours as needed for nausea.   Prenatal Vit-Fe Fumarate-FA (PRENATAL MULTIVITAMIN) TABS tablet Take 1 tablet by mouth daily at 12 noon.     Allergies:   Amoxicillin, Doxycycline , and Maxalt  [rizatriptan ]   Social History   Socioeconomic History   Marital status: Married    Spouse name: Jessica [name]   Number of children: 0   Years of education: Not on file   Highest education level: Bachelor's degree (e.g., BA, AB, BS)  Occupational History    Comment: RN [employer]  Tobacco Use   Smoking status: Never   Smokeless tobacco: Never  Vaping Use   Vaping status: Never Used  Substance and Sexual Activity   Alcohol use: Not Currently    Comment: occ   Drug use: No   Sexual activity: Yes  Other Topics Concern   Not on file  Social History Narrative   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   She lives with husband    caffeine  coffee 2 cups daily   Social Drivers of Health   Financial Resource Strain: Not on file  Food Insecurity: No Food Insecurity (03/26/2024)   Hunger Vital Sign    Worried About Running Out of Food in the Last Year: Never true    Ran Out of Food in the Last Year: Never true  Transportation Needs: No Transportation Needs (03/26/2024)   PRAPARE - Administrator, Civil Service (Medical): No    Lack of Transportation (Non-Medical): No  Physical Activity: Not on file  Stress: Not on file  Social Connections: Not on file      Family History  Problem Relation Age of Onset   Migraines  Mother        Started 4th or 5th grade   Seizures Mother        Febrile Seizures as a child   Seizures Father        Febrile Seizures as a child   Hypertension Father    Kidney Stones Father    Migraines Maternal Aunt    Migraines Maternal Uncle    Diabetes Maternal Grandmother    Stroke Maternal Grandfather        mini-stroke   Cancer Maternal Grandfather        Bladder cancer, Died at 10   Diabetes Maternal Grandfather    Mental retardation Other        Maternal Second Cousin   Other Other        Maternal Great Uncle had some sort of Retinal Vessel Occlusion   Breast cancer Maternal Aunt    Cancer Maternal Aunt        Peritoneal CA   Eating disorder Maternal Aunt    Depression Other        Father's side of the family   Colon cancer Neg Hx       ROS:   Please see the history of present illness.    palpitations All other systems reviewed and are negative.   Labs/EKG Reviewed:    EKG:  reviewed EKG from 9/3  Recent Labs: 03/26/2024: ALT 13; BUN 9; Creatinine, Ser 0.67; Hemoglobin 13.3; Platelets 260; Potassium 3.7; Sodium 135   Recent Lipid Panel Lab Results  Component Value Date/Time   CHOL 187 (H) 04/29/2015 02:42 PM   TRIG 116 04/29/2015 02:42 PM   HDL 38 04/29/2015 02:42 PM   CHOLHDL 4.9 04/29/2015 02:42 PM   LDLCALC 126 (H) 04/29/2015 02:42 PM    Physical Exam:    VS:  BP 112/86 (BP Location: Left Arm, Patient Position: Sitting, Cuff Size: Normal)   Pulse (!) 109   Ht 5' 6 (1.676 m)   Wt 194 lb 4.8 oz (88.1 kg)   LMP 07/23/2023 (Exact Date)   SpO2 99%   BMI 31.36 kg/m     Wt Readings from Last 3 Encounters:  03/28/24 194 lb 4.8 oz (88.1 kg)  03/26/24 195 lb 8 oz (88.7 kg)  03/25/24 195 lb 8 oz (88.7 kg)     GEN:  Well nourished,  well developed in no acute distress HEENT: Normal NECK: No JVD; No carotid bruits LYMPHATICS: No lymphadenopathy CARDIAC: RRR, no murmurs, rubs, gallops RESPIRATORY:  Clear to auscultation without rales, wheezing or  rhonchi  ABDOMEN: Soft, non-tender, non-distended MUSCULOSKELETAL:  No edema; No deformity  SKIN: Warm and dry NEUROLOGIC:  Alert and oriented x 3 PSYCHIATRIC:  Normal affect    Risk Assessment/Risk Calculators:     CARPREG II Risk Prediction Index Score:  1.  The patient's risk for a primary cardiac event is 5%.   Modified World Health Organization [facility]) Classification of Maternal CV Risk   Class I         ASSESSMENT & PLAN:    Tachycardia in pregnancy with suspected recurrence of postural orthostatic tachycardia syndrome (POTS) Elevated heart rates since July, possibly due to POTS re-triggered by stress and trauma.  Differential includes POTS and SVT. Persistent elevated heart rate concerning during pregnancy. - Apply heart monitor for 14 days to assess rhythm and diagnose arrhythmias. - Conduct virtual follow-up to discuss heart monitor findings. - Inform anesthesiology of heart rate issue for planned C-section.  Shortness of breath in pregnancy Episodes likely due to increased heart rate and pregnancy-related physical changes. - Order echocardiogram postpartum if shortness of breath persists.   Patient Instructions  Medication Instructions:  Your physician recommends that you continue on your current medications as directed. Please refer to the Current Medication list given to you today.  *If you need a refill on your cardiac medications before your next appointment, please call your pharmacy*   Testing/Procedures: ZIO XT- Long Term Monitor Instructions  Your physician has requested you wear a ZIO patch monitor for 14 days.  This is a single patch monitor. [employer] supplies one patch monitor per enrollment. Additional stickers are not available. Please do not apply patch if you will be having a Nuclear Stress Test,  Echocardiogram, Cardiac CT, MRI, or Chest Xray during the period you would be wearing the  monitor. The patch cannot be worn during these tests. You  cannot remove and re-apply the  ZIO XT patch monitor.  Your ZIO patch monitor will be mailed 3 day USPS to your address on file. It may take 3-5 days  to receive your monitor after you have been enrolled.  Once you have received your monitor, please review the enclosed instructions. Your monitor  has already been registered assigning a specific monitor serial # to you.  Billing and Patient Assistance Program Information  We have supplied [employer] with any of your insurance information on file for billing purposes. [employer] offers a sliding scale Patient Assistance Program for patients that do not have  insurance, or whose insurance does not completely cover the cost of the ZIO monitor.  You must apply for the Patient Assistance Program to qualify for this discounted rate.  To apply, please call [employer] at [phone], select option 4, select option 2, ask to apply for  Patient Assistance Program. Meredeth will ask your household income, and how many people  are in your household. They will quote your out-of-pocket cost based on that information.  [employer] will also be able to set up a 14-month, interest-free payment plan if needed.  Applying the monitor Do not shower for the first 24 hours. You may shower after the first 24 hours.  Press the button if you feel a symptom. You will hear a small click. Record Date, Time and  Symptom in the Patient Logbook.  When you are ready to remove  the patch, follow instructions on the last 2 pages of Patient  Logbook. Stick patch monitor onto the last page of Patient Logbook.  Place Patient Logbook in the blue and white box. Use locking tab on box and tape box closed  securely. The blue and white box has prepaid postage on it. Please place it in the mailbox as  soon as possible. Your physician should have your test results approximately 7 days after the  monitor has been mailed back to [facility].  Call [facility] Customer Care at [phone] if  you have questions regarding  your ZIO XT patch monitor. Call them immediately if you see an orange light blinking on your  monitor.  If your monitor falls off in less than 4 days, contact our Monitor department at [phone].  If your monitor becomes loose or falls off after 4 days call [employer] at [phone] for  suggestions on securing your monitor   Follow-Up: At [facility], you and your health needs are our priority.  As part of our continuing mission to provide you with exceptional heart care, our providers are all part of one team.  This team includes your primary Cardiologist (physician) and Advanced Practice Providers or APPs (Physician Assistants and Nurse Practitioners) who all work together to provide you with the care you need, when you need it.  Your next appointment:   6 week(s) via MyChart  Provider:   Dotsie Gillette, DO       Dispo:  No follow-ups on file.   Medication Adjustments/Labs and Tests Ordered: Current medicines are reviewed at length with the patient today.  Concerns regarding medicines are outlined above.  Tests Ordered: Orders Placed This Encounter  Procedures   LONG TERM MONITOR (3-14 DAYS)   Medication Changes: No orders of the defined types were placed in this encounter.

## 2024-03-29 ENCOUNTER — Encounter: Payer: Self-pay | Admitting: Cardiology

## 2024-03-30 ENCOUNTER — Ambulatory Visit: Payer: Self-pay | Admitting: Family Medicine

## 2024-04-02 ENCOUNTER — Inpatient Hospital Stay (HOSPITAL_COMMUNITY)
Admission: AD | Admit: 2024-04-02 | Discharge: 2024-04-02 | Disposition: A | Discharge status: Home / Self Care | Attending: Obstetrics and Gynecology | Admitting: Obstetrics and Gynecology

## 2024-04-02 ENCOUNTER — Other Ambulatory Visit: Payer: Self-pay

## 2024-04-02 ENCOUNTER — Encounter (HOSPITAL_COMMUNITY): Payer: Self-pay | Admitting: Obstetrics and Gynecology

## 2024-04-02 DIAGNOSIS — R519 Headache, unspecified: Secondary | ICD-10-CM | POA: Diagnosis present

## 2024-04-02 DIAGNOSIS — R609 Edema, unspecified: Secondary | ICD-10-CM | POA: Diagnosis not present

## 2024-04-02 DIAGNOSIS — O1203 Gestational edema, third trimester: Secondary | ICD-10-CM

## 2024-04-02 DIAGNOSIS — O139 Gestational [pregnancy-induced] hypertension without significant proteinuria, unspecified trimester: Secondary | ICD-10-CM | POA: Diagnosis present

## 2024-04-02 DIAGNOSIS — O26893 Other specified pregnancy related conditions, third trimester: Secondary | ICD-10-CM | POA: Diagnosis not present

## 2024-04-02 DIAGNOSIS — O133 Gestational [pregnancy-induced] hypertension without significant proteinuria, third trimester: Secondary | ICD-10-CM | POA: Diagnosis not present

## 2024-04-02 DIAGNOSIS — Z3A36 36 weeks gestation of pregnancy: Secondary | ICD-10-CM

## 2024-04-02 LAB — COMPREHENSIVE METABOLIC PANEL WITH GFR
ALT: 14 U/L (ref 0–44)
AST: 15 U/L (ref 15–41)
Albumin: 2.8 g/dL — ABNORMAL LOW (ref 3.5–5.0)
Alkaline Phosphatase: 133 U/L — ABNORMAL HIGH (ref 38–126)
Anion gap: 12 (ref 5–15)
BUN: 11 mg/dL (ref 6–20)
CO2: 19 mmol/L — ABNORMAL LOW (ref 22–32)
Calcium: 9.1 mg/dL (ref 8.9–10.3)
Chloride: 106 mmol/L (ref 98–111)
Creatinine, Ser: 0.55 mg/dL (ref 0.44–1.00)
GFR, Estimated: >60 mL/min (ref >60)
Glucose, Bld: 107 mg/dL — ABNORMAL HIGH (ref 70–99)
Potassium: 3.8 mmol/L (ref 3.5–5.1)
Sodium: 137 mmol/L (ref 135–145)
Total Bilirubin: 0.4 mg/dL (ref 0.0–1.2)
Total Protein: 6 g/dL — ABNORMAL LOW (ref 6.5–8.1)

## 2024-04-02 LAB — CBC
HCT: 38 % (ref 36.0–46.0)
Hemoglobin: 12.9 g/dL (ref 12.0–15.0)
MCH: 31.2 pg (ref 26.0–34.0)
MCHC: 33.9 g/dL (ref 30.0–36.0)
MCV: 91.8 fL (ref 80.0–100.0)
Platelets: 217 K/uL (ref 150–400)
RBC: 4.14 MIL/uL (ref 3.87–5.11)
RDW: 13.9 % (ref 11.5–15.5)
WBC: 9.9 K/uL (ref 4.0–10.5)
nRBC: 0 % (ref 0.0–0.2)

## 2024-04-02 LAB — PROTEIN / CREATININE RATIO, URINE
Creatinine, Urine: 79 mg/dL
Protein Creatinine Ratio: 0.2 mg/mg{creat} — ABNORMAL HIGH (ref 0.00–0.15)
Total Protein, Urine: 16 mg/dL

## 2024-04-02 LAB — OB RESULTS CONSOLE GBS: GBS: NEGATIVE

## 2024-04-02 MED ORDER — LABETALOL HCL 5 MG/ML IV SOLN: 80.0000 mg | INTRAVENOUS | Status: DC | PRN

## 2024-04-02 MED ORDER — CYCLOBENZAPRINE HCL 5 MG PO TABS
10.0000 mg | ORAL_TABLET | Freq: Once | ORAL | Status: AC
  Administered 2024-04-02: 10 mg via ORAL
  Filled 2024-04-02: qty 2

## 2024-04-02 MED ORDER — PROCHLORPERAZINE MALEATE 10 MG PO TABS
10.0000 mg | ORAL_TABLET | Freq: Once | ORAL | Status: AC
  Administered 2024-04-02: 10 mg via ORAL
  Filled 2024-04-02 (×2): qty 1

## 2024-04-02 MED ORDER — LACTATED RINGERS IV BOLUS
1000.0000 mL | Freq: Once | INTRAVENOUS | Status: AC
  Administered 2024-04-02: 1000 mL via INTRAVENOUS

## 2024-04-02 MED ORDER — DIPHENHYDRAMINE HCL 25 MG PO CAPS
25.0000 mg | ORAL_CAPSULE | Freq: Once | ORAL | Status: AC
  Administered 2024-04-02: 25 mg via ORAL
  Filled 2024-04-02: qty 1

## 2024-04-02 MED ORDER — HYDRALAZINE HCL 20 MG/ML IJ SOLN: 10.0000 mg | INTRAMUSCULAR | Status: DC | PRN

## 2024-04-02 MED ORDER — LABETALOL HCL 5 MG/ML IV SOLN: 20.0000 mg | INTRAVENOUS | Status: DC | PRN

## 2024-04-02 MED ORDER — ACETAMINOPHEN-CAFFEINE 500-65 MG PO TABS
2.0000 | ORAL_TABLET | Freq: Once | ORAL | Status: AC
  Administered 2024-04-02: 2 via ORAL
  Filled 2024-04-02: qty 2

## 2024-04-02 MED ORDER — LABETALOL HCL 5 MG/ML IV SOLN: 40.0000 mg | INTRAVENOUS | Status: DC | PRN

## 2024-04-02 NOTE — Discharge Instructions (Signed)
    [facility]     [facility]                    [facility]                 [facility]    **Purchase ONE of these store-brand Excedrin Tension Headache medications. Take it as directed for relief of headache. DO NOT TAKE AT THE SAME TIME AS TYLENOL /ACETAMINOPHEN **

## 2024-04-02 NOTE — MAU Note (Signed)
 Jessica [name] is a 28 y.o. at [redacted]w[redacted]d here in MAU reporting: being sent from the office with HBP, HA- 3day, bilateral leg swelling. NO c/o SROM, vaginal bleeding, contractions, chest pain, or visual disturbances.  +FM  Onset of complaint: HA by 3 days Pain score: 5/10 Vitals:   04/02/24 1345  BP: 120/83  Pulse: (!) 135  Resp: 18  Temp: 98.7 F (37.1 C)  SpO2: 99%     FHT: to room   Lab orders placed from triage: urine

## 2024-04-02 NOTE — MAU Provider Note (Cosign Needed Addendum)
 History     CSN: [id number]  Arrival date and time: 04/02/24 1330   Event Date/Time   First Provider Initiated Contact with Patient 04/02/24 1430      Chief Complaint  Patient presents with   Headache   Hypertension   Leg Swelling   HPI Ms. Jessica [name] is a 28 y.o. year old G49P0000 female at [redacted]w[redacted]d weeks gestation who was sent to MAU from her OB appt today for elevated BPs. She reports it was 130s/90s in the office. She reports she has had a H/A x 3 days. She has a h/o migraines, but they are usually unilateral on the LT side of her head. She describes this H/A as a tight band across my whole head; like a tension H/A. She receives [facility]; next appt is 04/03/2024. Scheduled for C/S due to breech presentation on 04/24/2024. Her spouse is present and contributing to the history taking.   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   Syncope 09/22/2014   Vasovagal syncope     Past Surgical History:  Procedure Laterality Date   ANTERIOR CRUCIATE LIGAMENT REPAIR  03/2010   EYE SURGERY  2000   clogged tear duct   WISDOM TOOTH EXTRACTION  2015    Family History  Problem Relation Age of Onset   Migraines Mother        Started 4th or 5th grade   Seizures Mother        Febrile Seizures as a child   Seizures Father        Febrile Seizures as a child   Hypertension Father    Kidney Stones Father    Migraines Maternal Aunt    Migraines Maternal Uncle    Diabetes Maternal Grandmother    Stroke Maternal  Grandfather        mini-stroke   Cancer Maternal Grandfather        Bladder cancer, Died at 64   Diabetes Maternal Grandfather    Mental retardation Other        Maternal Second Cousin   Other Other        Maternal Great Uncle had some sort of Retinal Vessel Occlusion   Breast cancer Maternal Aunt    Cancer Maternal Aunt        Peritoneal CA   Eating disorder Maternal Aunt    Depression Other        Father's side of the family   Colon cancer Neg Hx     Social History   Tobacco Use   Smoking status: Never   Smokeless tobacco: Never  Vaping Use   Vaping status: Never Used  Substance Use Topics   Alcohol use: Not Currently    Comment:  occ   Drug use: No    Allergies:  Allergies  Allergen Reactions   Amoxicillin Rash and Dermatitis   Doxycycline  Other (See Comments) and Dermatitis    Possible cause of rash   Maxalt  [Rizatriptan ]     Chest Tightness    No medications prior to admission.    Review of Systems  Constitutional: Negative.   Eyes: Negative.   Respiratory: Negative.    Cardiovascular:  Positive for leg swelling (bilaterally).  Gastrointestinal: Negative.   Endocrine: Negative.   Genitourinary: Negative.   Musculoskeletal: Negative.   Skin: Negative.   Allergic/Immunologic: Negative.   Neurological:  Positive for headaches (x 3 days; tight band around her head).  Hematological: Negative.   Psychiatric/Behavioral: Negative.     Physical Exam   Patient Vitals for the past 24 hrs:  BP Temp Temp src Pulse Resp SpO2 Height Weight  04/02/24 1800 125/89 -- -- (!) 129 -- -- -- --  04/02/24 1745 120/83 -- -- (!) 127 -- -- -- --  04/02/24 1735 133/87 -- -- (!) 115 -- -- -- --  04/02/24 1645 119/83 -- -- (!) 120 -- -- -- --  04/02/24 1630 111/75 -- -- (!) 107 -- -- -- --  04/02/24 1615 118/82 -- -- (!) 116 -- -- -- --  04/02/24 1600 119/76 -- -- (!) 118 -- -- -- --  04/02/24 1545 116/80 -- -- (!) 106 -- -- -- --  04/02/24 1530 110/81 -- -- (!) 114 --  -- -- --  04/02/24 1515 116/78 -- -- (!) 106 -- -- -- --  04/02/24 1500 109/85 -- -- (!) 126 -- -- -- --  04/02/24 1445 114/82 -- -- (!) 113 -- 96 % -- --  04/02/24 1440 -- -- -- -- -- 96 % -- --  04/02/24 1435 -- -- -- -- -- 96 % -- --  04/02/24 1430 120/82 -- -- (!) 117 -- 96 % -- --  04/02/24 1425 -- -- -- -- -- 96 % -- --  04/02/24 1420 -- -- -- -- -- 95 % -- --  04/02/24 1415 113/77 -- -- (!) 123 -- 96 % -- --  04/02/24 1410 -- -- -- -- -- 96 % -- --  04/02/24 1405 -- -- -- -- -- 96 % -- --  04/02/24 1358 121/77 -- -- (!) 122 -- -- -- --  04/02/24 1345 120/83 98.7 F (37.1 C) Oral (!) 135 18 99 % 5' 6 (1.676 m) 89.7 kg    Physical Exam Vitals and nursing note reviewed.  Constitutional:      Appearance: Normal appearance. She is normal weight.  HENT:     Head: Normocephalic and atraumatic.  Cardiovascular:     Rate and Rhythm: Tachycardia present.  Pulmonary:     Effort: Pulmonary effort is normal.  Abdominal:     Palpations: Abdomen is soft.  Genitourinary:    Comments: Not indicated Musculoskeletal:        General: Swelling (2+ pitting edema bilaterally from feet up to knees) present. Normal range of motion.  Skin:    General: Skin is warm and dry.  Neurological:     General: No focal deficit present.     Mental Status: She is alert and oriented to person, place, and time. Mental status is at baseline.     Cranial Nerves: No cranial nerve deficit.     Sensory: No sensory deficit.     Motor: No weakness.     Coordination: Coordination normal.  Gait: Gait normal.     Deep Tendon Reflexes: Reflexes normal.  Psychiatric:        Mood and Affect: Mood normal.        Behavior: Behavior normal.        Thought Content: Thought content normal.        Judgment: Judgment normal.   REACTIVE NST - FHR: 145 bpm / moderate variability / accels present / decels absent / TOCO: none  MAU Course  Procedures  MDM CCUA CBC CMP P/C Ratio Serial BP's  Excedrin Tension  H/A Flexeril  10 mg po  Benadryl  25 mg po  Compazine  10 mg po Offered Oxycodone or Imitrex  -- patient declined both; stating Imitrex  caused CP and I just don't feel the H/A is that severe to take narcotics.  *Consult with Dr. Zina @ [phone] - notified of patient's complaints, assessments, lab & NST results, tx plan tried Excedrin Tension H/A and Flexeril . About to try Compazine  and Benadryl . Also made aware that patient does not want to see Dr. Marne at all, so if she ends up having to have a C/S today, the possibility is there that she may ask to be cared for by FP instead - agrees with plan   Results for orders placed or performed during the hospital encounter of 04/02/24 (from the past 24 hours)  CBC     Status: None   Collection Time: 04/02/24  2:44 PM  Result Value Ref Range   WBC 9.9 4.0 - 10.5 K/uL   RBC 4.14 3.87 - 5.11 MIL/uL   Hemoglobin 12.9 12.0 - 15.0 g/dL   HCT 61.9 63.9 - 53.9 %   MCV 91.8 80.0 - 100.0 fL   MCH 31.2 26.0 - 34.0 pg   MCHC 33.9 30.0 - 36.0 g/dL   RDW 86.0 88.4 - 84.4 %   Platelets 217 150 - 400 K/uL   nRBC 0.0 0.0 - 0.2 %  Comprehensive metabolic panel     Status: Abnormal   Collection Time: 04/02/24  2:44 PM  Result Value Ref Range   Sodium 137 135 - 145 mmol/L   Potassium 3.8 3.5 - 5.1 mmol/L   Chloride 106 98 - 111 mmol/L   CO2 19 (L) 22 - 32 mmol/L   Glucose, Bld 107 (H) 70 - 99 mg/dL   BUN 11 6 - 20 mg/dL   Creatinine, Ser 9.44 0.44 - 1.00 mg/dL   Calcium  9.1 8.9 - 10.3 mg/dL   Total Protein 6.0 (L) 6.5 - 8.1 g/dL   Albumin 2.8 (L) 3.5 - 5.0 g/dL   AST 15 15 - 41 U/L   ALT 14 0 - 44 U/L   Alkaline Phosphatase 133 (H) 38 - 126 U/L   Total Bilirubin 0.4 0.0 - 1.2 mg/dL   GFR, Estimated >39 >39 mL/min   Anion gap 12 5 - 15  Protein / creatinine ratio, urine     Status: Abnormal   Collection Time: 04/02/24  2:52 PM  Result Value Ref Range   Creatinine, Urine 79 mg/dL   Total Protein, Urine 16 mg/dL   Protein Creatinine Ratio 0.20 (H) 0.00 -  0.15 mg/mg[Cre]      Assessment and Plan  1. Gestational hypertension, third trimester (Primary) - PEC labs stable  2. Gestational edema in third trimester - Elevate legs and drink at least 8-10 water bottles  3. Headache in pregnancy, antepartum, third trimester - Information provided on H/A and OTC Excedrin Tension H/A - Advised to come back  if there are any significant changes to her vision  4. [redacted] weeks gestation of pregnancy   - Discharge home - Keep scheduled f/u appt with [facility] tomorrow afternoon - Patient verbalized an understanding of the plan of care and agrees.   Kaycee Haycraft, CNM 04/02/2024, 3:05 PM

## 2024-04-04 ENCOUNTER — Encounter (HOSPITAL_COMMUNITY): Payer: Self-pay

## 2024-04-04 NOTE — Patient Instructions (Signed)
 Jessica [name]  04/04/2024   Your procedure is scheduled on:  04/08/2024  Arrive at 1015 at Entrance C on [address] at [facility]  and [employer]. You are invited to use the FREE valet parking or use the Visitor's parking deck.  Pick up the phone at the desk and dial [phone].  Call this number if you have problems the morning of surgery: [phone]  Remember:   Do not eat food:(After Midnight) Desps de medianoche.  You may drink clear liquids until  __0815___.  Clear liquids means a liquid you can see thru.  It can have color such as Cola or Kool aid.  Tea is OK and coffee as long as no milk or creamer of any kind.  Take these medicines the morning of surgery with A SIP OF WATER:  none   Do not wear jewelry, make-up or nail polish.  Do not wear lotions, powders, or perfumes. Do not wear deodorant.  Do not shave 48 hours prior to surgery.  Do not bring valuables to the hospital.  [facility] is not   responsible for any belongings or valuables brought to the hospital.  Contacts, dentures or bridgework may not be worn into surgery.  Leave suitcase in the car. After surgery it may be brought to your room.  For patients admitted to the hospital, checkout time is 11:00 AM the day of              discharge.      Please read over the following fact sheets that you were given:     Preparing for Surgery

## 2024-04-07 ENCOUNTER — Encounter (HOSPITAL_COMMUNITY)
Admission: RE | Admit: 2024-04-07 | Discharge: 2024-04-07 | Disposition: A | Discharge status: Home / Self Care | Source: Ambulatory Visit | Attending: Obstetrics and Gynecology | Admitting: Obstetrics and Gynecology

## 2024-04-07 DIAGNOSIS — Z3A36 36 weeks gestation of pregnancy: Secondary | ICD-10-CM | POA: Insufficient documentation

## 2024-04-07 DIAGNOSIS — O133 Gestational [pregnancy-induced] hypertension without significant proteinuria, third trimester: Secondary | ICD-10-CM | POA: Insufficient documentation

## 2024-04-07 HISTORY — DX: Gestational (pregnancy-induced) hypertension without significant proteinuria, unspecified trimester: O13.9

## 2024-04-07 HISTORY — DX: Gestational diabetes mellitus in pregnancy, unspecified control: O24.419

## 2024-04-07 LAB — CBC
HCT: 41.2 % (ref 36.0–46.0)
Hemoglobin: 13.2 g/dL (ref 12.0–15.0)
MCH: 30.6 pg (ref 26.0–34.0)
MCHC: 32 g/dL (ref 30.0–36.0)
MCV: 95.4 fL (ref 80.0–100.0)
Platelets: 225 K/uL (ref 150–400)
RBC: 4.32 MIL/uL (ref 3.87–5.11)
RDW: 14.1 % (ref 11.5–15.5)
WBC: 10.4 K/uL (ref 4.0–10.5)
nRBC: 0 % (ref 0.0–0.2)

## 2024-04-07 LAB — COMPREHENSIVE METABOLIC PANEL WITH GFR
ALT: 14 U/L (ref 0–44)
AST: 15 U/L (ref 15–41)
Albumin: 2.8 g/dL — ABNORMAL LOW (ref 3.5–5.0)
Alkaline Phosphatase: 138 U/L — ABNORMAL HIGH (ref 38–126)
Anion gap: 11 (ref 5–15)
BUN: 11 mg/dL (ref 6–20)
CO2: 20 mmol/L — ABNORMAL LOW (ref 22–32)
Calcium: 9.2 mg/dL (ref 8.9–10.3)
Chloride: 106 mmol/L (ref 98–111)
Creatinine, Ser: 0.55 mg/dL (ref 0.44–1.00)
GFR, Estimated: >60 mL/min (ref >60)
Glucose, Bld: 85 mg/dL (ref 70–99)
Potassium: 3.9 mmol/L (ref 3.5–5.1)
Sodium: 137 mmol/L (ref 135–145)
Total Bilirubin: <0.2 mg/dL (ref 0.0–1.2)
Total Protein: 6 g/dL — ABNORMAL LOW (ref 6.5–8.1)

## 2024-04-07 LAB — TYPE AND SCREEN
ABO/RH(D): B POS
Antibody Screen: NEGATIVE

## 2024-04-08 ENCOUNTER — Encounter (HOSPITAL_COMMUNITY): Payer: Self-pay | Admitting: Obstetrics and Gynecology

## 2024-04-08 ENCOUNTER — Inpatient Hospital Stay (HOSPITAL_COMMUNITY)
Admission: RE | Admit: 2024-04-08 | Discharge: 2024-04-10 | DRG: 788 | Disposition: A | Discharge status: Home / Self Care | Attending: Obstetrics and Gynecology | Admitting: Obstetrics and Gynecology

## 2024-04-08 ENCOUNTER — Inpatient Hospital Stay (HOSPITAL_COMMUNITY): Admitting: Anesthesiology

## 2024-04-08 ENCOUNTER — Other Ambulatory Visit: Payer: Self-pay

## 2024-04-08 ENCOUNTER — Encounter (HOSPITAL_COMMUNITY)
Admission: RE | Disposition: A | Discharge status: Home / Self Care | Payer: Self-pay | Source: Home / Self Care | Attending: Obstetrics and Gynecology

## 2024-04-08 DIAGNOSIS — O321XX0 Maternal care for breech presentation, not applicable or unspecified: Principal | ICD-10-CM | POA: Diagnosis present

## 2024-04-08 DIAGNOSIS — Z3A37 37 weeks gestation of pregnancy: Secondary | ICD-10-CM | POA: Diagnosis not present

## 2024-04-08 DIAGNOSIS — O133 Gestational [pregnancy-induced] hypertension without significant proteinuria, third trimester: Secondary | ICD-10-CM

## 2024-04-08 DIAGNOSIS — O24420 Gestational diabetes mellitus in childbirth, diet controlled: Secondary | ICD-10-CM | POA: Diagnosis present

## 2024-04-08 DIAGNOSIS — Z8249 Family history of ischemic heart disease and other diseases of the circulatory system: Secondary | ICD-10-CM | POA: Diagnosis not present

## 2024-04-08 DIAGNOSIS — O134 Gestational [pregnancy-induced] hypertension without significant proteinuria, complicating childbirth: Secondary | ICD-10-CM | POA: Diagnosis present

## 2024-04-08 DIAGNOSIS — Z833 Family history of diabetes mellitus: Secondary | ICD-10-CM | POA: Diagnosis not present

## 2024-04-08 DIAGNOSIS — Z98891 History of uterine scar from previous surgery: Principal | ICD-10-CM

## 2024-04-08 DIAGNOSIS — Z7189 Other specified counseling: Principal | ICD-10-CM

## 2024-04-08 LAB — GLUCOSE, CAPILLARY
Glucose-Capillary: 66 mg/dL — ABNORMAL LOW (ref 70–99)
Glucose-Capillary: 71 mg/dL (ref 70–99)

## 2024-04-08 LAB — RPR: RPR Ser Ql: NONREACTIVE

## 2024-04-08 SURGERY — Surgical Case
Anesthesia: Spinal

## 2024-04-08 MED ORDER — FENTANYL CITRATE (PF) 100 MCG/2ML IJ SOLN: 25.0000 ug | INTRAMUSCULAR | Status: DC | PRN

## 2024-04-08 MED ORDER — FLUOXETINE HCL 20 MG PO CAPS
40.0000 mg | ORAL_CAPSULE | Freq: Every day | ORAL | Status: DC
  Administered 2024-04-08 – 2024-04-09 (×2): 40 mg via ORAL
  Filled 2024-04-08 (×2): qty 2

## 2024-04-08 MED ORDER — ACETAMINOPHEN 10 MG/ML IV SOLN
INTRAVENOUS | Status: AC
  Filled 2024-04-08: qty 100

## 2024-04-08 MED ORDER — LACTATED RINGERS IV SOLN
INTRAVENOUS | Status: DC
Start: 2024-04-08 — End: 2024-04-08

## 2024-04-08 MED ORDER — OXYTOCIN-SODIUM CHLORIDE 30-0.9 UT/500ML-% IV SOLN
INTRAVENOUS | Status: DC | PRN
  Administered 2024-04-08: 300 mL via INTRAVENOUS

## 2024-04-08 MED ORDER — SODIUM CHLORIDE 0.9% FLUSH: 3.0000 mL | INTRAVENOUS | Status: DC | PRN

## 2024-04-08 MED ORDER — IBUPROFEN 600 MG PO TABS
600.0000 mg | ORAL_TABLET | Freq: Four times a day (QID) | ORAL | Status: DC | PRN
  Administered 2024-04-09 – 2024-04-10 (×4): 600 mg via ORAL
  Filled 2024-04-08 (×4): qty 1

## 2024-04-08 MED ORDER — OXYCODONE HCL 5 MG PO TABS: 5.0000 mg | ORAL_TABLET | Freq: Once | ORAL | Status: DC | PRN

## 2024-04-08 MED ORDER — DEXAMETHASONE SODIUM PHOSPHATE 10 MG/ML IJ SOLN
INTRAMUSCULAR | Status: AC
  Filled 2024-04-08: qty 1

## 2024-04-08 MED ORDER — ONDANSETRON HCL 4 MG/2ML IJ SOLN
INTRAMUSCULAR | Status: AC
Start: 2024-04-08 — End: 2024-04-08
  Filled 2024-04-08: qty 2

## 2024-04-08 MED ORDER — GENTAMICIN SULFATE 40 MG/ML IJ SOLN
5.0000 mg/kg | INTRAMUSCULAR | Status: DC
  Filled 2024-04-08: qty 9

## 2024-04-08 MED ORDER — KETOROLAC TROMETHAMINE 30 MG/ML IJ SOLN: 30.0000 mg | Freq: Four times a day (QID) | INTRAMUSCULAR | Status: DC

## 2024-04-08 MED ORDER — DIPHENHYDRAMINE HCL 25 MG PO CAPS
25.0000 mg | ORAL_CAPSULE | ORAL | Status: DC | PRN
  Administered 2024-04-09: 25 mg via ORAL
  Filled 2024-04-08: qty 1

## 2024-04-08 MED ORDER — SENNOSIDES-DOCUSATE SODIUM 8.6-50 MG PO TABS
2.0000 | ORAL_TABLET | ORAL | Status: DC
  Administered 2024-04-09: 2 via ORAL
  Filled 2024-04-08: qty 2

## 2024-04-08 MED ORDER — PRENATAL MULTIVITAMIN CH
1.0000 | ORAL_TABLET | Freq: Every day | ORAL | Status: DC
  Administered 2024-04-09 – 2024-04-10 (×2): 1 via ORAL
  Filled 2024-04-08 (×2): qty 1

## 2024-04-08 MED ORDER — LACTATED RINGERS IV SOLN: INTRAVENOUS | Status: DC

## 2024-04-08 MED ORDER — NALOXONE HCL 4 MG/10ML IJ SOLN: 1.0000 ug/kg/h | INTRAVENOUS | Status: DC | PRN

## 2024-04-08 MED ORDER — TETANUS-DIPHTH-ACELL PERTUSSIS 5-2.5-18.5 LF-MCG/0.5 IM SUSY: 0.5000 mL | PREFILLED_SYRINGE | Freq: Once | INTRAMUSCULAR | Status: DC

## 2024-04-08 MED ORDER — PHENYLEPHRINE HCL-NACL 20-0.9 MG/250ML-% IV SOLN
INTRAVENOUS | Status: DC | PRN
  Administered 2024-04-08: 60 ug/min via INTRAVENOUS

## 2024-04-08 MED ORDER — MORPHINE SULFATE (PF) 0.5 MG/ML IJ SOLN
INTRAMUSCULAR | Status: DC | PRN
  Administered 2024-04-08: 150 ug via INTRATHECAL

## 2024-04-08 MED ORDER — ACETAMINOPHEN 500 MG PO TABS
1000.0000 mg | ORAL_TABLET | Freq: Four times a day (QID) | ORAL | Status: AC
  Administered 2024-04-08 – 2024-04-09 (×4): 1000 mg via ORAL
  Filled 2024-04-08 (×4): qty 2

## 2024-04-08 MED ORDER — ACETAMINOPHEN 10 MG/ML IV SOLN
INTRAVENOUS | Status: DC | PRN
Start: 2024-04-08 — End: 2024-04-08
  Administered 2024-04-08: 1000 mg via INTRAVENOUS

## 2024-04-08 MED ORDER — MENTHOL 3 MG MT LOZG: 1.0000 | LOZENGE | OROMUCOSAL | Status: DC | PRN

## 2024-04-08 MED ORDER — MORPHINE SULFATE (PF) 0.5 MG/ML IJ SOLN
INTRAMUSCULAR | Status: AC
  Filled 2024-04-08: qty 10

## 2024-04-08 MED ORDER — OXYCODONE HCL 5 MG PO TABS: 5.0000 mg | ORAL_TABLET | ORAL | Status: DC | PRN

## 2024-04-08 MED ORDER — MEASLES, MUMPS & RUBELLA VAC IJ SOLR: 0.5000 mL | Freq: Once | INTRAMUSCULAR | Status: DC

## 2024-04-08 MED ORDER — POVIDONE-IODINE 10 % EX SWAB
2.0000 | Freq: Once | CUTANEOUS | Status: DC
Start: 2024-04-08 — End: 2024-04-08

## 2024-04-08 MED ORDER — FENTANYL CITRATE (PF) 100 MCG/2ML IJ SOLN
INTRAMUSCULAR | Status: AC
  Filled 2024-04-08: qty 2

## 2024-04-08 MED ORDER — DROPERIDOL 2.5 MG/ML IJ SOLN: 0.6250 mg | Freq: Once | INTRAMUSCULAR | Status: DC | PRN

## 2024-04-08 MED ORDER — ONDANSETRON HCL 4 MG/2ML IJ SOLN
INTRAMUSCULAR | Status: DC | PRN
Start: 2024-04-08 — End: 2024-04-08
  Administered 2024-04-08: 4 mg via INTRAVENOUS

## 2024-04-08 MED ORDER — DIBUCAINE (PERIANAL) 1 % EX OINT: 1.0000 | TOPICAL_OINTMENT | CUTANEOUS | Status: DC | PRN

## 2024-04-08 MED ORDER — DIPHENHYDRAMINE HCL 50 MG/ML IJ SOLN: 12.5000 mg | INTRAMUSCULAR | Status: DC | PRN

## 2024-04-08 MED ORDER — STERILE WATER FOR IRRIGATION IR SOLN
Status: DC | PRN
Start: 2024-04-08 — End: 2024-04-08
  Administered 2024-04-08: 1

## 2024-04-08 MED ORDER — COCONUT OIL OIL
1.0000 | TOPICAL_OIL | Status: DC | PRN
  Administered 2024-04-09: 1 via TOPICAL

## 2024-04-08 MED ORDER — DEXAMETHASONE SODIUM PHOSPHATE 10 MG/ML IJ SOLN
INTRAMUSCULAR | Status: DC | PRN
  Administered 2024-04-08: 10 mg via INTRAVENOUS

## 2024-04-08 MED ORDER — ACETAMINOPHEN 10 MG/ML IV SOLN: 1000.0000 mg | Freq: Once | INTRAVENOUS | Status: DC | PRN

## 2024-04-08 MED ORDER — OXYCODONE HCL 5 MG/5ML PO SOLN: 5.0000 mg | Freq: Once | ORAL | Status: DC | PRN

## 2024-04-08 MED ORDER — SIMETHICONE 80 MG PO CHEW
80.0000 mg | CHEWABLE_TABLET | Freq: Three times a day (TID) | ORAL | Status: DC
  Administered 2024-04-09 – 2024-04-10 (×5): 80 mg via ORAL
  Filled 2024-04-08 (×5): qty 1

## 2024-04-08 MED ORDER — ZOLPIDEM TARTRATE 5 MG PO TABS: 5.0000 mg | ORAL_TABLET | Freq: Every evening | ORAL | Status: DC | PRN

## 2024-04-08 MED ORDER — ONDANSETRON HCL 4 MG/2ML IJ SOLN: 4.0000 mg | Freq: Three times a day (TID) | INTRAMUSCULAR | Status: DC | PRN

## 2024-04-08 MED ORDER — SODIUM CHLORIDE 0.9 % IR SOLN
Status: DC | PRN
  Administered 2024-04-08: 1

## 2024-04-08 MED ORDER — WITCH HAZEL-GLYCERIN EX PADS: 1.0000 | MEDICATED_PAD | CUTANEOUS | Status: DC | PRN

## 2024-04-08 MED ORDER — OXYTOCIN-SODIUM CHLORIDE 30-0.9 UT/500ML-% IV SOLN
INTRAVENOUS | Status: AC
  Filled 2024-04-08: qty 500

## 2024-04-08 MED ORDER — BUPIVACAINE IN DEXTROSE 0.75-8.25 % IT SOLN
INTRATHECAL | Status: DC | PRN
  Administered 2024-04-08: 1.5 mL via INTRATHECAL

## 2024-04-08 MED ORDER — TRANEXAMIC ACID-NACL 1000-0.7 MG/100ML-% IV SOLN
INTRAVENOUS | Status: AC
  Filled 2024-04-08: qty 100

## 2024-04-08 MED ORDER — FENTANYL CITRATE (PF) 100 MCG/2ML IJ SOLN
INTRAMUSCULAR | Status: DC | PRN
  Administered 2024-04-08: 15 ug via INTRAVENOUS

## 2024-04-08 MED ORDER — KETOROLAC TROMETHAMINE 30 MG/ML IJ SOLN
30.0000 mg | Freq: Four times a day (QID) | INTRAMUSCULAR | Status: AC
  Administered 2024-04-08 – 2024-04-09 (×4): 30 mg via INTRAVENOUS
  Filled 2024-04-08 (×4): qty 1

## 2024-04-08 MED ORDER — TRANEXAMIC ACID-NACL 1000-0.7 MG/100ML-% IV SOLN
1000.0000 mg | Freq: Once | INTRAVENOUS | Status: AC
  Administered 2024-04-08: 1000 mg via INTRAVENOUS

## 2024-04-08 MED ORDER — SIMETHICONE 80 MG PO CHEW: 80.0000 mg | CHEWABLE_TABLET | ORAL | Status: DC | PRN

## 2024-04-08 MED ORDER — CLINDAMYCIN PHOSPHATE 900 MG/50ML IV SOLN: 900.0000 mg | INTRAVENOUS | Status: DC

## 2024-04-08 MED ORDER — OXYTOCIN-SODIUM CHLORIDE 30-0.9 UT/500ML-% IV SOLN: 2.5000 [IU]/h | INTRAVENOUS | Status: AC

## 2024-04-08 MED ORDER — DIPHENHYDRAMINE HCL 25 MG PO CAPS: 25.0000 mg | ORAL_CAPSULE | Freq: Four times a day (QID) | ORAL | Status: DC | PRN

## 2024-04-08 MED ORDER — PHENYLEPHRINE 80 MCG/ML (10ML) SYRINGE FOR IV PUSH (FOR BLOOD PRESSURE SUPPORT)
PREFILLED_SYRINGE | INTRAVENOUS | Status: AC
  Filled 2024-04-08: qty 10

## 2024-04-08 MED ORDER — OXYCODONE-ACETAMINOPHEN 5-325 MG PO TABS: 1.0000 | ORAL_TABLET | ORAL | Status: DC | PRN

## 2024-04-08 MED ORDER — SODIUM CHLORIDE 0.9 % IV SOLN
INTRAVENOUS | Status: DC | PRN
  Administered 2024-04-08: 2 g via INTRAVENOUS

## 2024-04-08 MED ORDER — NALOXONE HCL 0.4 MG/ML IJ SOLN: 0.4000 mg | INTRAMUSCULAR | Status: DC | PRN

## 2024-04-08 MED ORDER — CEFOTETAN DISODIUM 2 G IJ SOLR
INTRAMUSCULAR | Status: AC
  Filled 2024-04-08: qty 2

## 2024-04-08 MED ORDER — ACETAMINOPHEN 325 MG PO TABS
650.0000 mg | ORAL_TABLET | ORAL | Status: DC | PRN
  Administered 2024-04-10 (×2): 650 mg via ORAL
  Filled 2024-04-08 (×2): qty 2

## 2024-04-08 MED ORDER — PHENYLEPHRINE HCL-NACL 20-0.9 MG/250ML-% IV SOLN
INTRAVENOUS | Status: AC
  Filled 2024-04-08: qty 250

## 2024-04-08 MED ORDER — CLINDAMYCIN PHOSPHATE 900 MG/50ML IV SOLN
INTRAVENOUS | Status: AC
  Filled 2024-04-08: qty 50

## 2024-04-08 SURGICAL SUPPLY — 26 items
CHLORAPREP W/TINT 26 (MISCELLANEOUS) ×2 IMPLANT
CLAMP UMBILICAL CORD (MISCELLANEOUS) ×1 IMPLANT
CLOTH BEACON ORANGE TIMEOUT ST (SAFETY) ×1 IMPLANT
DERMABOND ADVANCED .7 DNX12 (GAUZE/BANDAGES/DRESSINGS) IMPLANT
DRSG OPSITE POSTOP 4X10 (GAUZE/BANDAGES/DRESSINGS) ×1 IMPLANT
ELECTRODE REM PT RTRN 9FT ADLT (ELECTROSURGICAL) ×1 IMPLANT
EXTRACTOR VACUUM KIWI (MISCELLANEOUS) IMPLANT
EXTRACTOR VACUUM M CUP 4 TUBE (SUCTIONS) IMPLANT
GLOVE BIOGEL PI IND STRL 7.0 (GLOVE) ×1 IMPLANT
GLOVE SURG ORTHO 8.0 STRL STRW (GLOVE) ×1 IMPLANT
GOWN STRL REUS W/TWL LRG LVL3 (GOWN DISPOSABLE) ×2 IMPLANT
KIT ABG SYR 3ML LUER SLIP (SYRINGE) ×1 IMPLANT
MAT PREVALON FULL STRYKER (MISCELLANEOUS) IMPLANT
NDL HYPO 25X5/8 SAFETYGLIDE (NEEDLE) ×1 IMPLANT
NEEDLE HYPO 25X5/8 SAFETYGLIDE (NEEDLE) ×1 IMPLANT
NS IRRIG 1000ML POUR BTL (IV SOLUTION) ×1 IMPLANT
PACK C SECTION WH (CUSTOM PROCEDURE TRAY) ×1 IMPLANT
PAD OB MATERNITY 4.3X12.25 (PERSONAL CARE ITEMS) ×1 IMPLANT
RTRCTR C-SECT PINK 25CM LRG (MISCELLANEOUS) IMPLANT
SUT MNCRL 0 VIOLET CTX 36 (SUTURE) ×3 IMPLANT
SUT MON AB 4-0 PS1 27 (SUTURE) ×1 IMPLANT
SUT PDS AB 0 CTX 60 (SUTURE) IMPLANT
SUT VIC AB 1 CTX36XBRD ANBCTRL (SUTURE) IMPLANT
TOWEL OR 17X24 6PK STRL BLUE (TOWEL DISPOSABLE) ×1 IMPLANT
TRAY FOLEY W/BAG SLVR 14FR LF (SET/KITS/TRAYS/PACK) ×1 IMPLANT
WATER STERILE IRR 1000ML POUR (IV SOLUTION) ×1 IMPLANT

## 2024-04-08 NOTE — Anesthesia Procedure Notes (Signed)
 Spinal  Patient location during procedure: OR Start time: 04/08/2024 12:36 PM End time: 04/08/2024 12:40 PM Reason for block: surgical anesthesia Staffing Performed: anesthesiologist  Anesthesiologist: Boone Fess, MD Performed by: Boone Fess, MD Authorized by: Boone Fess, MD   Preanesthetic Checklist Completed: patient identified, IV checked, site marked, risks and benefits discussed, surgical consent, monitors and equipment checked, pre-op evaluation and timeout performed Spinal Block Patient position: sitting Prep: ChloraPrep and site prepped and draped Patient monitoring: heart rate, continuous pulse ox, blood pressure and cardiac monitor Approach: midline Location: L3-4 Injection technique: single-shot Needle Needle type: Whitacre and Introducer  Needle gauge: 24 G Needle length: 9 cm Assessment Sensory level: T10 Events: CSF return Additional Notes Meticulous sterile technique used throughout (CHG prep, sterile gloves, sterile drape). Negative paresthesia. Negative blood return. Positive free-flowing CSF. Expiration date of kit checked and confirmed. Patient tolerated procedure well, without complications.

## 2024-04-08 NOTE — Anesthesia Postprocedure Evaluation (Signed)
 Anesthesia Post Note  Patient: Jessica [name]  Procedure(s) Performed: CESAREAN DELIVERY     Patient location during evaluation: L&D Anesthesia Type: Spinal Level of consciousness: oriented and awake and alert Pain management: pain level controlled Vital Signs Assessment: post-procedure vital signs reviewed and stable Respiratory status: spontaneous breathing, respiratory function stable and patient connected to nasal cannula oxygen Cardiovascular status: blood pressure returned to baseline and stable Postop Assessment: no headache, no backache, no apparent nausea or vomiting and spinal receding Anesthetic complications: no   No notable events documented.  Last Vitals:  Vitals:   04/08/24 1626 04/08/24 1737  BP: 126/87 128/83  Pulse: 97 89  Resp: 16 18  Temp: (!) 36.4 C   SpO2: 98% 97%    Last Pain:  Vitals:   04/08/24 1737  TempSrc:   PainSc: 0-No pain   Pain Goal:                   Rome Ade

## 2024-04-08 NOTE — Lactation Note (Signed)
 This note was copied from a baby's chart. Lactation Consultation Note  Patient Name: Jessica [name] Unijb'd Date: 04/08/2024 Age:29 hours Reason for consult: Initial assessment;1st time breastfeeding;Early term 37-38.6wks.  P1, ETI, C/S delivery, MOB latched infant on her right breast with pillow support using the football hold position, Infant sustained her latch and still breastfeeding after 11 minutes when LC left the room. MOB knows to call for further latch assistance if needed. MOB will continue to breastfeeding infant by cues, on demand, 8-12 times within 24 hours, skin to skin. LC discussed hand expression with breast model and MOB taught back hand expression with colostrum. LC discussed hunger cues and signs of satiety with MOB. LC discussed importance of maternal meals, snacks and hydration. MOB was  made aware of O/P services, breastfeeding support groups, community resources, and our phone # for post-discharge questions.    Maternal Data Has patient been taught Hand Expression?: Yes Does the patient have breastfeeding experience prior to this delivery?: No  Feeding Mother's Current Feeding Choice: Breast Milk  LATCH Score Latch: Grasps breast easily, tongue down, lips flanged, rhythmical sucking.  Audible Swallowing: A few with stimulation  Type of Nipple: Everted at rest and after stimulation  Comfort (Breast/Nipple): Soft / non-tender  Hold (Positioning): Assistance needed to correctly position infant at breast and maintain latch.  LATCH Score: 8   Lactation Tools Discussed/Used    Interventions Interventions: Breast feeding basics reviewed;Assisted with latch;Skin to skin;Breast compression;Adjust position;Support pillows;Position options;Hand express;Education;DEBP;Guidelines for Milk Supply and Pumping Schedule Handout;LC Services brochure;CDC milk storage guidelines;CDC Guidelines for Breast Pump Cleaning  Discharge Pump: DEBP;Personal  Consult  Status Consult Status: Follow-up Date: 04/09/24 Follow-up type: In-patient    Grayce LULLA Batter 04/08/2024, 4:54 PM

## 2024-04-08 NOTE — Op Note (Signed)
 Cesarean Section Procedure Note  Pre-operative Diagnosis:IUP at 37 weeks, Gestational HTN, Breech Presentation, A1DM   Post-operative Diagnosis: same  Surgeon: MARGET ALM BROCKS   Assistants: surg tech  Anesthesia: spinal  Procedure:  Low Segment Transverse cesarean section  Procedure Details  The patient was seen in the Holding Room. The risks, benefits, complications, treatment options, and expected outcomes were discussed with the patient.  The patient concurred with the proposed plan, giving informed consent.  The site of surgery properly noted/marked.. A Time Out was held and the above information confirmed.  After induction of anesthesia, the patient was draped and prepped in the usual sterile manner. A Pfannenstiel incision was made and carried down through the subcutaneous tissue to the fascia. Fascial incision was made and extended transversely. The fascia was separated from the underlying rectus tissue superiorly and inferiorly. The peritoneum was identified and entered. Peritoneal incision was extended longitudinally. The utero-vesical peritoneal reflection was incised transversely and the bladder flap was bluntly freed from the lower uterine segment. A low transverse uterine incision was made. Delivered from incomplete breech presentation a baby with Apgar scores of  8,9.  After the umbilical cord was clamped and cut cord blood was obtained for evaluation. The placenta was removed intact and appeared normal. The uterine outline, tubes and ovaries appeared normal. The uterine incision was closed with running locked sutures of 0 monocryl and imbricated with 0 monocryl. Hemostasis was observed. Lavage was carried out until clear. The peritoneum was then closed with 0 monocryl and rectus muscles plicated in the midline.  After hemostasis was assured, the fascia was then reapproximated with running sutures of 0 Vicryl. Irrigation was applied and after adequate hemostasis was assured, the skin was  reapproximated with subcutaneous sutures using 4-0 monocryl.  Instrument, sponge, and needle counts were correct prior the abdominal closure and at the conclusion of the case. The patient received 2 grams cefotetan  preoperatively.  Findings: Viable  female  Estimated Blood Loss:  809 cc         Specimens: Placenta was sent to labor and delivery         Complications:  None

## 2024-04-08 NOTE — Transfer of Care (Signed)
 Immediate Anesthesia Transfer of Care Note  Patient: Jessica [name]  Procedure(s) Performed: CESAREAN DELIVERY  Patient Location: PACU  Anesthesia Type:Spinal  Level of Consciousness: awake  Airway & Oxygen Therapy: Patient Spontanous Breathing  Post-op Assessment: Report given to RN and Post -op Vital signs reviewed and stable  Post vital signs: Reviewed and stable  Last Vitals:  Vitals Value Taken Time  BP    Temp    Pulse    Resp    SpO2      Last Pain:  Vitals:   04/08/24 1036  TempSrc: Oral         Complications: No notable events documented.

## 2024-04-08 NOTE — Anesthesia Preprocedure Evaluation (Addendum)
 Anesthesia Evaluation  Patient identified by MRN, date of birth, ID band Patient awake    Reviewed: Allergy & Precautions, NPO status , Patient's Chart, lab work & pertinent test results  History of Anesthesia Complications Negative for: history of anesthetic complications  Airway Mallampati: II  TM Distance: >3 FB Neck ROM: Full    Dental no notable dental hx. (+) Teeth Intact   Pulmonary neg pulmonary ROS, neg sleep apnea, neg COPD, Patient abstained from smoking.Not current smoker   Pulmonary exam normal breath sounds clear to auscultation       Cardiovascular Exercise Tolerance: Good METShypertension, (-) CAD and (-) Past MI + dysrhythmias  Rhythm:Regular Rate:Normal - Systolic murmurs    Neuro/Psych  Headaches PSYCHIATRIC DISORDERS Anxiety Depression       GI/Hepatic ,neg GERD  ,,(+)     (-) substance abuse    Endo/Other    Renal/GU negative Renal ROS     Musculoskeletal   Abdominal   Peds  Hematology Denies blood thinner use or bleeding disorders.    Anesthesia Other Findings Denies blood thinner use or bleeding diatheses. Recent labs reviewed. Past Medical History: No date: Adjustment disorder with mixed anxiety and depressed mood No date: Anorexia nervosa No date: Anxiety No date: Autonomic dysfunction No date: Depression No date: Gestational diabetes No date: Migraine headache No date: PCOS (polycystic ovarian syndrome) No date: POTS (postural orthostatic tachycardia syndrome) No date: Pregnancy induced hypertension No date: Splenomegaly     Comment:  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 09/22/2014: Syncope No date: Vasovagal syncope   Reproductive/Obstetrics (+) Pregnancy                              Anesthesia Physical Anesthesia Plan  ASA: 2  Anesthesia Plan: Spinal   Post-op Pain Management: Ofirmev  IV (intra-op)* and Toradol  IV (intra-op)*   Induction:   PONV Risk Score and  Plan: 4 or greater and Ondansetron  and Dexamethasone   Airway Management Planned: Natural Airway  Additional Equipment:   Intra-op Plan:   Post-operative Plan:   Informed Consent: I have reviewed the patients History and Physical, chart, labs and discussed the procedure including the risks, benefits and alternatives for the proposed anesthesia with the patient or authorized representative who has indicated his/her understanding and acceptance.       Plan Discussed with: CRNA and Surgeon  Anesthesia Plan Comments: (Discussed R/B/A of neuraxial anesthesia technique with patient: - rare risks of spinal/epidural hematoma, nerve damage, infection - Risk of PDPH - Risk of itching - Risk of nausea and vomiting - Risk of conversion to general anesthesia and its associated risks, including sore throat, damage to lips/teeth/oropharynx, and rare risks such as cardiac and respiratory events. - Risk of surgical bleeding requiring blood products - Risk of allergic reactions Discussed the role of CRNA in patient's perioperative care.  Patient voiced understanding.)        Anesthesia Quick Evaluation

## 2024-04-08 NOTE — H&P (Signed)
 Jessica [name] is a 28 y.o. female presenting for primary c/s due to breech presentation and gestational HTN.  Has been seen several times in [facility]U and admission for mild GHTN and is to be delivered early term. Pregnancy also complicated by A1DM with good control. GBS neg Has a hx of POTS and seen by cardiologist for this.. OB History     Gravida  1   Para  0   Term  0   Preterm  0   AB  0   Living  0      SAB  0   IAB  0   Ectopic  0   Multiple  0   Live Births  0          Past Medical History:  Diagnosis Date   Adjustment disorder with mixed anxiety and depressed mood    Anorexia nervosa    Anxiety    Autonomic dysfunction    Depression    Gestational diabetes    Migraine headache    PCOS (polycystic ovarian syndrome)    POTS (postural orthostatic tachycardia syndrome)    Pregnancy induced hypertension    Splenomegaly    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   Syncope 09/22/2014   Vasovagal syncope    Past Surgical History:  Procedure Laterality Date   ANTERIOR CRUCIATE LIGAMENT REPAIR  03/2010   EYE SURGERY  2000   clogged tear duct   WISDOM TOOTH EXTRACTION  2015   Family History: family history includes Breast cancer in her maternal aunt; Cancer in her maternal aunt and maternal grandfather; Depression in an other family member; Diabetes in her maternal grandfather and maternal grandmother; Eating disorder in her maternal aunt; Hypertension in her father; Kidney Stones in her father; Mental retardation in an other family member; Migraines in her maternal aunt, maternal uncle, and mother; Other in an other family member; Seizures in her father and mother; Stroke in her maternal grandfather. Social History:  reports that she has never smoked. She has never used smokeless tobacco. She reports that she does not currently use alcohol. She reports that she does not use drugs.     Maternal Diabetes: Yes:  Diabetes Type:  Diet controlled Genetic Screening: Normal Maternal  Ultrasounds/Referrals: Normal Fetal Ultrasounds or other Referrals:  None Maternal Substance Abuse:  No Significant Maternal Medications:  Meds include: Prozac  Significant Maternal Lab Results:  Group B Strep negative Number of Prenatal Visits:greater than 3 verified prenatal visits Maternal Vaccinations:Flu Other Comments:  None  Review of Systems History   Blood pressure (!) 126/98, pulse (!) 110, temperature 98.2 F (36.8 C), temperature source Oral, resp. rate 16, height 5' 6 (1.676 m), weight 89.1 kg, last menstrual period 07/23/2023, SpO2 97%. Exam Physical Exam  Vitals and nursing note reviewed. Exam conducted with a chaperone present.  Constitutional:      Appearance: Normal appearance.  HENT:     Head: Normocephalic.  Eyes:     Pupils: Pupils are equal, round, and reactive to light.  Cardiovascular:     Rate and Rhythm: Normal rate and regular rhythm.     Pulses: Normal pulses.  Abdominal:     General: Abdomen is Gravid, nontender Neurological:     Mental Status: She is alert. Pt wishes Full resuscitation in the event of a code. Prenatal labs: ABO, Rh: --/--/B POS (09/15 1000) Antibody: NEG (09/15 1000) Rubella: Immune (02/20 0000) RPR: NON REACTIVE (09/15 0959)  HBsAg: Negative (02/20 0000)  HIV: Non-reactive (02/20 0000)  GBS:     Assessment/Plan: IUP at term GHTN - mild but has been seen multiple times and diagnosed and recommend delivery at 37 weeks Breech presentation for primary C/S Risks and benefits of C/S were discussed.  All questions were answered and informed consent was obtained.  Plan to proceed with low segment transverse Cesarean Section.   Alm JAYSON Cook 04/08/2024, 12:27 PM

## 2024-04-08 NOTE — Progress Notes (Signed)
 Hypoglycemic Event  CBG: 66  Treatment: 4 oz juice/soda  Symptoms: Shaky  Follow-up CBG: Time:1416 CBG Result:71  Possible Reasons for Event: Inadequate meal intake  Comments/MD notified: Dr. Boone Nian M Shiya Fogelman

## 2024-04-09 LAB — CBC
HCT: 32.4 % — ABNORMAL LOW (ref 36.0–46.0)
Hemoglobin: 10.9 g/dL — ABNORMAL LOW (ref 12.0–15.0)
MCH: 31.4 pg (ref 26.0–34.0)
MCHC: 33.6 g/dL (ref 30.0–36.0)
MCV: 93.4 fL (ref 80.0–100.0)
Platelets: 190 K/uL (ref 150–400)
RBC: 3.47 MIL/uL — ABNORMAL LOW (ref 3.87–5.11)
RDW: 13.9 % (ref 11.5–15.5)
WBC: 17.7 K/uL — ABNORMAL HIGH (ref 4.0–10.5)
nRBC: 0 % (ref 0.0–0.2)

## 2024-04-09 NOTE — Progress Notes (Signed)
 POD # 1  Doing well. No complaints. BP 116/71   Pulse 92   Temp 97.8 F (36.6 C) (Oral)   Resp 18   Ht 5' 6 (1.676 m)   Wt 89.1 kg   LMP 07/23/2023 (Exact Date)   SpO2 98%   Breastfeeding Unknown   BMI 31.72 kg/m  Results for orders placed or performed during the hospital encounter of 04/08/24 (from the past 24 hours)  Glucose, capillary     Status: Abnormal   Collection Time: 04/08/24  2:03 PM  Result Value Ref Range   Glucose-Capillary 66 (L) 70 - 99 mg/dL  Glucose, capillary     Status: None   Collection Time: 04/08/24  2:16 PM  Result Value Ref Range   Glucose-Capillary 71 70 - 99 mg/dL  CBC     Status: Abnormal   Collection Time: 04/09/24  5:35 AM  Result Value Ref Range   WBC 17.7 (H) 4.0 - 10.5 K/uL   RBC 3.47 (L) 3.87 - 5.11 MIL/uL   Hemoglobin 10.9 (L) 12.0 - 15.0 g/dL   HCT 67.5 (L) 63.9 - 53.9 %   MCV 93.4 80.0 - 100.0 fL   MCH 31.4 26.0 - 34.0 pg   MCHC 33.6 30.0 - 36.0 g/dL   RDW 86.0 88.4 - 84.4 %   Platelets 190 150 - 400 K/uL   nRBC 0.0 0.0 - 0.2 %   Abdomen is soft and non tender  Bandage clean and dry and intact  Uterus non tender   POD # 1  Doing well Routine care  Discharge home tomorrow

## 2024-04-09 NOTE — Lactation Note (Signed)
 This note was copied from a baby's chart. Lactation Consultation Note  Patient Name: Jessica [name] Unijb'd Date: 04/09/2024 Age:29 hours Reason for consult: Follow-up assessment;Mother's request;Primapara;1st time breastfeeding;Early term 37-38.6wks;Maternal endocrine disorder;Breastfeeding assistance;RN request  P1- MOB requested latching assistance. Per RN, MOB complains that infant will only latch onto the right breast and seems upset with the left breast. LC assisted with placing infant on the left breast in the football hold. LC demonstrated how to compress the breast and stroke her nipple from nose to chin to elicit a gaping mouth. Infant was able to latch deeply. LC demonstrated how to flange infant's lips. Infant was sucking in a nutritive manner and chomping. Infant would suck for a while, then pull away and cry. LC showed MOB how to keep a little pressure when holding infant. With this pressure, infant would stay latched vs popping off. After 15 minutes of being on and off the breast, LC felt like infant was overstimulated. LC encouraged swaddling and trying again to decrease stimulation. Once infant was swaddled, she stayed latched and nursing consistently for 10 more minutes before falling asleep. LC encouraged MOB to call for further assistance as needed.  Maternal Data Has patient been taught Hand Expression?: Yes Does the patient have breastfeeding experience prior to this delivery?: No  Feeding Mother's Current Feeding Choice: Breast Milk  LATCH Score Latch: Grasps breast easily, tongue down, lips flanged, rhythmical sucking.  Audible Swallowing: Spontaneous and intermittent  Type of Nipple: Everted at rest and after stimulation  Comfort (Breast/Nipple): Soft / non-tender  Hold (Positioning): Assistance needed to correctly position infant at breast and maintain latch.  LATCH Score: 9   Lactation Tools Discussed/Used Tools: Pump;Flanges;Coconut oil Flange Size: 18  (getting 13 mm inserts for home) Breast pump type: Manual Pump Education: Setup, frequency, and cleaning;Milk Storage Reason for Pumping: MOB request Pumping frequency: 15-20 min every 3 hrs  Interventions Interventions: Breast feeding basics reviewed;Assisted with latch;Hand express;Breast compression;Adjust position;Support pillows;Position options;Coconut oil;Hand pump;Education;LC Services brochure  Discharge Discharge Education: Engorgement and breast care;Warning signs for feeding baby Pump: Manual;Hands Free;Personal  Consult Status Consult Status: Follow-up Date: 04/10/24 Follow-up type: In-patient    Recardo Hoit BS, IBCLC 04/09/2024, 8:02 PM

## 2024-04-10 MED ORDER — IBUPROFEN 600 MG PO TABS: 600.0000 mg | ORAL_TABLET | Freq: Four times a day (QID) | ORAL | 0 refills | Status: AC | PRN

## 2024-04-10 MED ORDER — DOCUSATE SODIUM 100 MG PO CAPS: 100.0000 mg | ORAL_CAPSULE | Freq: Two times a day (BID) | ORAL | 2 refills | Status: AC

## 2024-04-10 MED ORDER — OXYCODONE HCL 5 MG PO TABS: 5.0000 mg | ORAL_TABLET | ORAL | 0 refills | Status: AC | PRN

## 2024-04-10 NOTE — Social Work (Signed)
 MOB was referred for history of depression/anxiety.  * Referral screened out by Clinical Social Worker because none of the following criteria appear to apply:  ~ History of anxiety/depression during this pregnancy, or of post-partum depression following prior delivery.  ~ Diagnosis of anxiety and/or depression within last 3 years OR * MOB's symptoms currently being treated with medication and/or therapy. Per chart review MOB has an active prescription for Prozac, Edinburgh=0  Please contact the Clinical Social Worker if needs arise, or by MOB request.  Jessica [name], LCSWA Clinical Social Worker [phone]

## 2024-04-10 NOTE — Lactation Note (Signed)
 This note was copied from a baby's chart. Lactation Consultation Note  Patient Name: Jessica [name] Unijb'd Date: 04/10/2024 Age:29 hours, P1 , early D/C  Reason for consult: Follow-up assessment;Infant weight loss;Primapara;1st time breastfeeding;Early term 37-38.6wks;Maternal endocrine disorder (7 % weight loss,) Per mom baby fed at 1200 for 15 mins.  Per mom breast feeding is going better and after LC assisted last night the positioning is better.  LC reviewed breast feeding D/C teaching and the [facility] resources.  LC reviewed engorgement prevention and tx and reminded mom to feed with cues and by 3 hours STS until baby is back to birth weight and gaining well.   Maternal Data Has patient been taught Hand Expression?: Yes Does the patient have breastfeeding experience prior to this delivery?: No  Feeding Mother's Current Feeding Choice: Breast Milk  LATCH Score  range 6-9  Parents and baby are all packed up and ready for D/C so LC unable to recheck the Latch after the last one was 6 , prior to that it was 9    Lactation Tools Discussed/Used Tools: Pump;Flanges Flange Size: 18 Breast pump type: Manual Pump Education: Milk Storage;Setup, frequency, and cleaning  Interventions Interventions: Breast feeding basics reviewed;Hand pump;Education;LC Services brochure;CDC milk storage guidelines;CDC Guidelines for Breast Pump Cleaning  Discharge Discharge Education: Engorgement and breast care;Warning signs for feeding baby;Outpatient recommendation;Other (comment) (if needed) Pump: Personal;Hands Free;Manual  Consult Status Consult Status: Complete Date: 04/10/24    Rollene Jenkins Fiedler 04/10/2024, 1:15 PM

## 2024-04-10 NOTE — Discharge Summary (Signed)
 Postpartum Discharge Summary     Patient Name: Jessica [name] DOB: 1996-06-04 MRN: [id number]  Date of admission: 04/08/2024 Delivery date:04/08/2024 Delivering provider: MARGET LENIS Date of discharge: 04/10/2024  Admitting diagnosis: Breech presentation, fetus 1 of multiple gestation [O32.1XX1] Breech presentation [O32.1XX0] Intrauterine pregnancy: [redacted]w[redacted]d     Secondary diagnosis:  Principal Problem:   Breech presentation  Additional problems:  None    Discharge diagnosis: Term Pregnancy Delivered                                              Post partum procedures:None Augmentation: N/A Complications: None  Hospital course: Sceduled C/S   28 y.o. yo G1P1001 at [redacted]w[redacted]d was admitted to the hospital 04/08/2024 for scheduled cesarean section with the following indication:Malpresentation and gestational HTN.Delivery details are as follows:  Membrane Rupture Time/Date: 1:09 PM,04/08/2024  Delivery Method:C-Section, Vacuum Assisted Operative Delivery:N/A Details of operation can be found in separate operative note.  Patient had a postpartum course complicated by none.  She is ambulating, tolerating a regular diet, passing flatus, and urinating well. Patient is discharged home in stable condition on  04/10/24        Newborn Data: Birth date:04/08/2024 Birth time:1:12 PM Gender:Female Living status:Living Apgars:8 ,9  Weight:3160 g    Magnesium Sulfate received: No Immunizations administered: Immunization History  Administered Date(s) Administered   DTaP 08/17/1995, 10/09/1995, 12/19/1995, 09/17/1996, 06/14/1999   Fluzone Influenza virus vaccine,trivalent (IIV3), split virus 04/23/2016   HIB (PRP-OMP) 08/17/1995, 10/09/1995, 12/19/1995, 09/17/1996   HPV Quadrivalent 10/27/2008, 02/01/2009, 06/08/2009   Hepatitis A 12/19/1995, 05/06/2007   Hepatitis B August 31, 1994, 07/09/1995, 03/18/1996, 03/18/1996   Hepatitis B, ADULT 05/31/2021   Hpv-Unspecified 10/27/2008, 02/01/2009, 06/08/2009    IPV 08/17/1995, 10/09/1995, 06/14/1999   Influenza, Quadrivalent, Recombinant, Inj, Pf 05/01/2019   Influenza,inj,Quad PF,6+ Mos 04/24/2016, 05/10/2020, 04/27/2021, 04/28/2022   Influenza-Unspecified 04/24/2016, 04/02/2017, 04/11/2018, 05/01/2019   MMR 06/10/1996, 06/14/1999   Meningococcal Conjugate 10/27/2008   PFIZER(Purple Top)SARS-COV-2 Vaccination 03/24/2020, 04/16/2020   Tdap 11/13/2014, 02/05/2024   Varicella 09/30/2002, 05/06/2007    Physical exam  Vitals:   04/09/24 0220 04/09/24 0607 04/09/24 2212 04/10/24 0538  BP:  116/71 114/80 115/78  Pulse:  92 97 96  Resp: 18 18 16 16   Temp:  97.8 F (36.6 C) 98.3 F (36.8 C) 98.6 F (37 C)  TempSrc:  Oral Oral Oral  SpO2: 100% 98% 98% 98%  Weight:      Height:       General: alert, cooperative, and no distress Lochia: appropriate Uterine Fundus: firm Incision: Healing well with no significant drainage DVT Evaluation: No evidence of DVT seen on physical exam. Labs: Lab Results  Component Value Date   WBC 17.7 (H) 04/09/2024   HGB 10.9 (L) 04/09/2024   HCT 32.4 (L) 04/09/2024   MCV 93.4 04/09/2024   PLT 190 04/09/2024      Latest Ref Rng & Units 04/07/2024    9:59 AM  CMP  Glucose 70 - 99 mg/dL 85   BUN 6 - 20 mg/dL 11   Creatinine 9.55 - 1.00 mg/dL 9.44   Sodium 864 - 854 mmol/L 137   Potassium 3.5 - 5.1 mmol/L 3.9   Chloride 98 - 111 mmol/L 106   CO2 22 - 32 mmol/L 20   Calcium  8.9 - 10.3 mg/dL 9.2   Total Protein 6.5 - 8.1 g/dL  6.0   Total Bilirubin 0.0 - 1.2 mg/dL <9.7   Alkaline Phos 38 - 126 U/L 138   AST 15 - 41 U/L 15   ALT 0 - 44 U/L 14    Edinburgh Score:    04/09/2024    2:20 AM  Edinburgh Postnatal Depression Scale Screening Tool  I have been able to laugh and see the funny side of things. 0  I have looked forward with enjoyment to things. 0  I have blamed myself unnecessarily when things went wrong. 0  I have been anxious or worried for no good reason. 0  I have felt scared or panicky  for no good reason. 0  Things have been getting on top of me. 0  I have been so unhappy that I have had difficulty sleeping. 0  I have felt sad or miserable. 0  I have been so unhappy that I have been crying. 0  The thought of harming myself has occurred to me. 0  Edinburgh Postnatal Depression Scale Total 0      After visit meds:  Allergies as of 04/10/2024       Reactions   Amoxicillin Dermatitis, Rash   TOLERATED CEFAZOLIN   Doxycycline  Other (See Comments), Dermatitis   Possible cause of rash   Maxalt  [rizatriptan ]    Chest Tightness        Medication List     STOP taking these medications    magnesium gluconate 500 (27 Mg) MG Tabs tablet Commonly known as: MAGONATE   ondansetron  4 MG disintegrating tablet Commonly known as: ZOFRAN -ODT       TAKE these medications    acetaminophen  325 MG tablet Commonly known as: TYLENOL  Take 2 tablets (650 mg total) by mouth every 4 (four) hours as needed (for pain scale < 4  OR  temperature  >/=  100.5 F).   docusate sodium  100 MG capsule Commonly known as: Colace Take 1 capsule (100 mg total) by mouth 2 (two) times daily.   FLUoxetine  40 MG capsule Commonly known as: PROZAC  Take 1 capsule (40 mg total) by mouth daily.   ibuprofen  600 MG tablet Commonly known as: ADVIL  Take 1 tablet (600 mg total) by mouth every 6 (six) hours as needed.   oxyCODONE  5 MG immediate release tablet Commonly known as: Oxy IR/ROXICODONE  Take 1 tablet (5 mg total) by mouth every 4 (four) hours as needed for severe pain (pain score 7-10).   prenatal multivitamin Tabs tablet Take 1 tablet by mouth daily.         Discharge home in stable condition Infant Feeding: Bottle and Breast Infant Disposition:home with mother Discharge instruction: per After Visit Summary and Postpartum booklet. Activity: Advance as tolerated. Pelvic rest for 6 weeks.  Diet: routine diet Anticipated Birth Control: Unsure Postpartum Appointment:6  weeks Additional Postpartum F/U: BP check 2-3 days Future Appointments: Future Appointments  Date Time Provider Department Center  05/09/2024  2:00 PM Tobb, Kardie, DO [facility] None   Follow up Visit:      04/10/2024 Kelly Delon Milian, MD

## 2024-04-14 ENCOUNTER — Encounter (HOSPITAL_COMMUNITY): Payer: Self-pay | Admitting: Obstetrics and Gynecology

## 2024-04-14 LAB — SURGICAL PATHOLOGY

## 2024-04-17 ENCOUNTER — Telehealth (HOSPITAL_COMMUNITY): Payer: Self-pay

## 2024-04-17 NOTE — Telephone Encounter (Signed)
 04/17/2024 1934  Name: Jessica [name] MRN: [id number] DOB: 12/25/1995  Reason for Call:  Transition of Care Hospital Discharge Call  Contact Status: Patient Contact Status: Message  Language assistant needed:          Follow-Up Questions:    Van Postnatal Depression Scale:  In the Past 7 Days:    PHQ2-9 Depression Scale:     Discharge Follow-up:    Post-discharge interventions: NA  Signature  Rosaline Deretha PEAK

## 2024-04-18 ENCOUNTER — Inpatient Hospital Stay (HOSPITAL_COMMUNITY)

## 2024-04-18 ENCOUNTER — Inpatient Hospital Stay (HOSPITAL_COMMUNITY)
Admission: AD | Admit: 2024-04-18 | Discharge: 2024-04-18 | Disposition: A | Discharge status: Home / Self Care | Attending: Obstetrics and Gynecology | Admitting: Obstetrics and Gynecology

## 2024-04-18 ENCOUNTER — Encounter (HOSPITAL_COMMUNITY): Payer: Self-pay | Admitting: Obstetrics and Gynecology

## 2024-04-18 DIAGNOSIS — O99893 Other specified diseases and conditions complicating puerperium: Secondary | ICD-10-CM | POA: Insufficient documentation

## 2024-04-18 DIAGNOSIS — R1011 Right upper quadrant pain: Secondary | ICD-10-CM

## 2024-04-18 DIAGNOSIS — Z98891 History of uterine scar from previous surgery: Secondary | ICD-10-CM | POA: Diagnosis not present

## 2024-04-18 HISTORY — DX: Urinary tract infection, site not specified: N39.0

## 2024-04-18 HISTORY — DX: Calculus of kidney: N20.0

## 2024-04-18 LAB — COMPREHENSIVE METABOLIC PANEL WITH GFR
ALT: 23 U/L (ref 0–44)
AST: 16 U/L (ref 15–41)
Albumin: 3.2 g/dL — ABNORMAL LOW (ref 3.5–5.0)
Alkaline Phosphatase: 112 U/L (ref 38–126)
Anion gap: 14 (ref 5–15)
BUN: 11 mg/dL (ref 6–20)
CO2: 25 mmol/L (ref 22–32)
Calcium: 8.9 mg/dL (ref 8.9–10.3)
Chloride: 104 mmol/L (ref 98–111)
Creatinine, Ser: 0.6 mg/dL (ref 0.44–1.00)
GFR, Estimated: >60 mL/min (ref >60)
Glucose, Bld: 82 mg/dL (ref 70–99)
Potassium: 4.2 mmol/L (ref 3.5–5.1)
Sodium: 143 mmol/L (ref 135–145)
Total Bilirubin: 0.6 mg/dL (ref 0.0–1.2)
Total Protein: 6.4 g/dL — ABNORMAL LOW (ref 6.5–8.1)

## 2024-04-18 LAB — CBC
HCT: 39.5 % (ref 36.0–46.0)
Hemoglobin: 12.8 g/dL (ref 12.0–15.0)
MCH: 30.2 pg (ref 26.0–34.0)
MCHC: 32.4 g/dL (ref 30.0–36.0)
MCV: 93.2 fL (ref 80.0–100.0)
Platelets: 440 K/uL — ABNORMAL HIGH (ref 150–400)
RBC: 4.24 MIL/uL (ref 3.87–5.11)
RDW: 13.2 % (ref 11.5–15.5)
WBC: 10 K/uL (ref 4.0–10.5)
nRBC: 0 % (ref 0.0–0.2)

## 2024-04-18 NOTE — MAU Provider Note (Addendum)
 Chief Complaint:  Abdominal Pain   HPI  Event Date/Time  First Provider Initiated Contact with Patient 04/18/24 1306     Jessica [name] is a 28 y.o. G1P1001 10 days post-partum after a C-section. She presents today for upper abdominal pain and nausea that began Sunday 9/21. She is not currently in any pain or having any nausea. She called her office yesterday, and was told to come in today to be assessed. She explains that the pain would come and go, nothing seemed to cause it or make it better or worse. When the pain would come on she rated it as a 6/10. She has tried gas-ex and tums. She had one episode of vomiting yesterday. She denies any diarrhea or constipation. She has been passing gas and having regular bowel movements. Her incision is doing well and she has had her 1 week incision check in the office.    Additional history obtained from partner  Pregnancy Course: Receives care at [facility] . Prenatal records reviewed.   Past Medical History:  Diagnosis Date   Adjustment disorder with mixed anxiety and depressed mood    Anorexia nervosa    Anxiety    Autonomic dysfunction    Depression    Gestational diabetes    Kidney stone    Migraine headache    PCOS (polycystic ovarian syndrome)    POTS (postural orthostatic tachycardia syndrome)    Pregnancy induced hypertension    Splenomegaly    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   Syncope 09/22/2014   UTI (urinary tract infection)    Vasovagal syncope    OB History  Gravida Para Term Preterm AB Living  1 1 1  0 0 1  SAB IAB Ectopic Multiple Live Births  0 0 0 0 1    # Outcome Date GA Lbr Len/2nd Weight Sex Type Anes PTL Lv  1 Term 04/08/24 [redacted]w[redacted]d  3160 g F CS-Vac Spinal  LIV   Past Surgical History:  Procedure Laterality Date   ANTERIOR CRUCIATE LIGAMENT REPAIR Left 03/2010   CESAREAN SECTION N/A 04/08/2024   Procedure: CESAREAN DELIVERY;  Surgeon: Marget Lenis, MD;  Location: [facility] LD ORS;  Service: Obstetrics;  Laterality: N/A;    EYE SURGERY  2000   clogged tear duct   WISDOM TOOTH EXTRACTION  2015   Family History  Problem Relation Age of Onset   Diabetes Mother        pre-diabetic   Migraines Mother        Started 4th or 5th grade   Hypertension Father    Kidney Stones Father    Migraines Maternal Aunt    Breast cancer Maternal Aunt    Cancer Maternal Aunt        Peritoneal CA   Eating disorder Maternal Aunt    Migraines Maternal Uncle    Diabetes Maternal Grandmother    Stroke Maternal Grandfather        mini-stroke   Cancer Maternal Grandfather        Bladder cancer, Died at 26   Diabetes Maternal Grandfather    Mental retardation Other        Maternal Second Cousin   Other Other        Maternal Great Uncle had some sort of Retinal Vessel Occlusion   Depression Other        Father's side of the family   Colon cancer Neg Hx    Social History   Tobacco Use   Smoking status: Never  Smokeless tobacco: Never  Vaping Use   Vaping status: Never Used  Substance Use Topics   Alcohol use: Not Currently    Comment: occ   Drug use: No   Allergies  Allergen Reactions   Amoxicillin Dermatitis and Rash    TOLERATED CEFAZOLIN   Doxycycline  Other (See Comments) and Dermatitis    Possible cause of rash   Maxalt  [Rizatriptan ]     Chest Tightness   Medications Prior to Admission  Medication Sig Dispense Refill Last Dose/Taking   acetaminophen  (TYLENOL ) 325 MG tablet Take 2 tablets (650 mg total) by mouth every 4 (four) hours as needed (for pain scale < 4  OR  temperature  >/=  100.5 F). 30 tablet 0 04/17/2024 Evening   docusate sodium  (COLACE) 100 MG capsule Take 1 capsule (100 mg total) by mouth 2 (two) times daily. 60 capsule 2 04/17/2024 Evening   FLUoxetine  (PROZAC ) 40 MG capsule Take 1 capsule (40 mg total) by mouth daily. 90 capsule 3 04/17/2024 Evening   ibuprofen  (ADVIL ) 600 MG tablet Take 1 tablet (600 mg total) by mouth every 6 (six) hours as needed. 30 tablet 0 04/17/2024 Noon   Prenatal  Vit-Fe Fumarate-FA (PRENATAL MULTIVITAMIN) TABS tablet Take 1 tablet by mouth daily.   04/17/2024 Evening   oxyCODONE  (OXY IR/ROXICODONE ) 5 MG immediate release tablet Take 1 tablet (5 mg total) by mouth every 4 (four) hours as needed for severe pain (pain score 7-10). 15 tablet 0     I have reviewed patient's Past Medical Hx, Surgical Hx, Family Hx, Social Hx, medications and allergies.   ROS  Pertinent items noted in HPI and remainder of comprehensive ROS otherwise negative.   PHYSICAL EXAM  Patient Vitals for the past 24 hrs:  BP Temp Pulse Resp Height Weight  04/18/24 1555 129/88 -- 91 -- -- --  04/18/24 1305 115/83 -- 82 -- -- --  04/18/24 1240 119/87 98 F (36.7 C) 86 18 5' 6 (1.676 m) 78.5 kg    Constitutional: Well-developed, well-nourished female in no acute distress.  HEENT: atraumatic, normocephalic. Neck has normal ROM. EOM grossly intact. Cardiovascular: normal rate & rhythm, warm and well-perfused Respiratory: normal effort, no problems with respiration noted GI: Abd soft, non-tender, non-distended, incision healing well, no erythema, drainage, warmth MSK: Extremities nontender, no edema Skin: warm and dry. Acyanotic, no jaundice or pallor. Neurologic: Alert and oriented x 4. No abnormal coordination. Psychiatric: Normal mood. Speech not slurred, not rapid/pressured. Patient is cooperative. GU: no CVA tenderness   Labs: Results for orders placed or performed during the hospital encounter of 04/18/24 (from the past 24 hours)  Comprehensive metabolic panel     Status: Abnormal   Collection Time: 04/18/24  1:49 PM  Result Value Ref Range   Sodium 143 135 - 145 mmol/L   Potassium 4.2 3.5 - 5.1 mmol/L   Chloride 104 98 - 111 mmol/L   CO2 25 22 - 32 mmol/L   Glucose, Bld 82 70 - 99 mg/dL   BUN 11 6 - 20 mg/dL   Creatinine, Ser 9.39 0.44 - 1.00 mg/dL   Calcium  8.9 8.9 - 10.3 mg/dL   Total Protein 6.4 (L) 6.5 - 8.1 g/dL   Albumin 3.2 (L) 3.5 - 5.0 g/dL   AST 16 15 -  41 U/L   ALT 23 0 - 44 U/L   Alkaline Phosphatase 112 38 - 126 U/L   Total Bilirubin 0.6 0.0 - 1.2 mg/dL   GFR, Estimated >39 >39 mL/min  Anion gap 14 5 - 15  CBC     Status: Abnormal   Collection Time: 04/18/24  1:49 PM  Result Value Ref Range   WBC 10.0 4.0 - 10.5 K/uL   RBC 4.24 3.87 - 5.11 MIL/uL   Hemoglobin 12.8 12.0 - 15.0 g/dL   HCT 60.4 63.9 - 53.9 %   MCV 93.2 80.0 - 100.0 fL   MCH 30.2 26.0 - 34.0 pg   MCHC 32.4 30.0 - 36.0 g/dL   RDW 86.7 88.4 - 84.4 %   Platelets 440 (H) 150 - 400 K/uL   nRBC 0.0 0.0 - 0.2 %    Imaging:  US  Abdomen Limited RUQ (LIVER/GB) Result Date: 04/18/2024 CLINICAL DATA:  [id number] RUQ pain 151471 EXAM: ULTRASOUND ABDOMEN LIMITED RIGHT UPPER QUADRANT COMPARISON:  June 08, 2017 FINDINGS: Gallbladder: No gallstones. No wall thickening or pericholecystic fluid. No sonographic Murphy's sign noted by sonographer. Common bile duct: Diameter: 4 mm Liver: Normal echogenicity. No focal lesion identified. No intrahepatic biliary ductal dilation. Portal vein is patent on color Doppler imaging with normal direction of blood flow towards the liver. Other: None. IMPRESSION: No cholecystolithiasis or changes of acute cholecystitis. Electronically Signed   By: Rogelia Myers M.D.   On: 04/18/2024 15:24    MDM & MAU COURSE  MDM: High  MAU Course: Patient presented with nausea and upper abdominal pain since Sunday. She is not currently in any pain or nauseous. Patient was normotensive. Creatinine and LFTs were within normal limits. She had a RUQ ultrasound that showed no cholecystolithiasis or changes of acute cholecystitis. Patient was sent home in stable condition with return precautions given.   Differential diagnosis considered for upper abdominal pain includes but is not limited to: cholecystitis, biliary colic, preeclampsia, appendicitis, abdominal hernia, constipation, pancreatitis, ACS, small bowel obstruction, perforation, post-partum pain, post C-section  pain   Orders Placed This Encounter  Procedures   US  Abdomen Limited RUQ (LIVER/GB)   Comprehensive metabolic panel   CBC   Discharge patient   No orders of the defined types were placed in this encounter.   ASSESSMENT   1. RUQ pain   2. Postpartum state   3. History of C-section     PLAN  Discharge home in stable condition with return precautions.  - Follow-up outpatient as recommended      Allergies as of 04/18/2024       Reactions   Amoxicillin Dermatitis, Rash   TOLERATED CEFAZOLIN   Doxycycline  Other (See Comments), Dermatitis   Possible cause of rash   Maxalt  [rizatriptan ]    Chest Tightness        Medication List     TAKE these medications    acetaminophen  325 MG tablet Commonly known as: TYLENOL  Take 2 tablets (650 mg total) by mouth every 4 (four) hours as needed (for pain scale < 4  OR  temperature  >/=  100.5 F).   docusate sodium  100 MG capsule Commonly known as: Colace Take 1 capsule (100 mg total) by mouth 2 (two) times daily.   FLUoxetine  40 MG capsule Commonly known as: PROZAC  Take 1 capsule (40 mg total) by mouth daily.   ibuprofen  600 MG tablet Commonly known as: ADVIL  Take 1 tablet (600 mg total) by mouth every 6 (six) hours as needed.   oxyCODONE  5 MG immediate release tablet Commonly known as: Oxy IR/ROXICODONE  Take 1 tablet (5 mg total) by mouth every 4 (four) hours as needed for severe pain (pain score 7-10).  prenatal multivitamin Tabs tablet Take 1 tablet by mouth daily.        Raguel KANDICE Lee, DO    Attestation of Supervision of Student:  I confirm that I have verified the information documented in the Resident Physician's   note and that I have also personally reperformed the history, physical exam and all medical decision making activities.  I have verified that all services and findings are accurately documented in this student's note; and I agree with management and plan as outlined in the documentation. I have also  made any necessary editorial changes.  I personally reviewed the HPI, Physical Exam and interpreted the labs and personally reviewed the ultrasound images and agree with the A/P as discussed above.  Olam DELENA Dalton, NP [facility] [employer], [facility] 04/18/2024 7:34 PM

## 2024-04-18 NOTE — MAU Note (Signed)
 Jessica [name] is a 28 y.o. at Unknown here in MAU reporting: PP C-section 04/08/24. C/o upper abd pain and Nausea since Sunday. Vomited x1 last night. OB office told her to come in for lab work and some imaging.   LMP:  Onset of complaint: Sunday Pain score: 1  Vitals:   04/18/24 1240  BP: 119/87  Pulse: 86  Resp: 18  Temp: 98 F (36.7 C)     FHT: n/a   Lab orders placed from triage:

## 2024-04-18 NOTE — MAU Note (Addendum)
 C/s was for breech.  Abd soft, non-tender on palpation.  Incision is healing well, no redness, drainage or hot spots.  Pt reports has had no problems with it.  Urinating with out issue.  Having regular BM's and passing gas.  No issues. Currently no pain in abd. No one home is sick.  Pt is breast feeding, that is going well.  Baby is doing well.  Was doing ok today, almost didn't come, but felt she needed to do as advised by dr.

## 2024-04-22 ENCOUNTER — Ambulatory Visit: Payer: Self-pay | Admitting: Cardiology

## 2024-04-22 DIAGNOSIS — R002 Palpitations: Secondary | ICD-10-CM | POA: Diagnosis not present

## 2024-04-27 ENCOUNTER — Telehealth: Admitting: Family Medicine

## 2024-04-27 DIAGNOSIS — N61 Mastitis without abscess: Secondary | ICD-10-CM | POA: Diagnosis not present

## 2024-04-27 MED ORDER — CEPHALEXIN 500 MG PO CAPS: 500.0000 mg | ORAL_CAPSULE | Freq: Three times a day (TID) | ORAL | 0 refills | Status: AC

## 2024-04-27 NOTE — Patient Instructions (Signed)
 Mastitis  Mastitis is inflammation of the breast tissue. It most often happens in females who are breastfeeding. But it can happen to anyone, including males. It will sometimes go away on its own. Other times, mastitis may need treatment. What are the causes? In people who are not lactating, mastitis is usually caused by bacteria that gets into the breast tissue through cuts, cracks, or openings in the skin. Sometimes, mastitis can happen when there are no cuts or openings in the skin. Other causes include: Nipple piercing. Some forms of breast cancer. What are the signs or symptoms? Symptoms of this condition include: Swelling, redness, tenderness, and pain in the breast. The area may also feel warm. Swelling of the glands under the arm. Nipple discharge. Tiredness (fatigue), headache, and flu-like muscle aches. Fever and chills. Nausea and vomiting. Symptoms can last 2-5 days. Breast pain and redness are at their worst on days 2 and 3, but can go away by day 5. If an infection develops and is not treated, a collection of pus (abscess) may form. How is this diagnosed? Mastitis is often diagnosed based on a physical exam and your symptoms. You may have other tests, such as: Blood tests. Fluid tests. Fluid is removed from the abscess to check for bacteria. Mammogram or ultrasound tests. How is this treated? Treatment may include: Using hot or cold compresses on the affected area. Pain medicine. Antibiotic or steroid medicines. Self-care, such as rest and drinking more fluids. Removing fluid from an abscess, if one has formed. Follow these instructions at home: Breast care  Keep your nipples clean and dry. If told, put ice on the affected area. Put ice in a plastic bag. Place a towel between your skin and the bag. Leave the ice on for 20 minutes, 2-3 times a day. If told, apply heat to the affected area as often as told by your health care provider. Use the heat source that your  provider recommends, such as a moist heat pack or a heating pad. Place a towel between your skin and the heat source. Leave the heat on for 20-30 minutes. If your skin turns bright red, remove the ice or heat right away to prevent skin damage. The risk of damage is higher if you cannot feel pain, heat, or cold. Medicines Take over-the-counter and prescription medicines only as told by your provider. If you were prescribed antibiotics, take them as told by your provider. Do not stop using the antibiotic even if you start to feel better. Contact a health care provider if: You have pus-like discharge coming from the nipple. You have a fever. Your pain and swelling get worse. Your symptoms do not start to get better within 2 days of starting treatment. Your pain is not helped by medicine. Get help right away if: You have a red line going from your breast toward your armpit. This information is not intended to replace advice given to you by your health care provider. Make sure you discuss any questions you have with your health care provider. Document Revised: 05/11/2022 Document Reviewed: 05/11/2022 Elsevier Patient Education  2024 [employer].
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
## 2024-05-09 ENCOUNTER — Telehealth: Admitting: Cardiology

## 2024-05-09 ENCOUNTER — Encounter: Payer: Self-pay | Admitting: Cardiology

## 2024-05-09 VITALS — Ht 66.0 in | Wt 160.0 lb

## 2024-05-09 DIAGNOSIS — G90A Postural orthostatic tachycardia syndrome (POTS): Secondary | ICD-10-CM

## 2024-05-09 NOTE — Progress Notes (Addendum)
 Cardio-Obstetrics Clinic  Follow Up Note   Date:  05/11/2024   ID:  Jessica [name], DOB 26-Oct-1995, MRN [id number]  PCP:  Baird Comer GAILS, NP   [facility] Providers Cardiologist:  Dub Huntsman, DO  Electrophysiologist:  None        Referring MD: Cleatus Arlyss RAMAN, MD   Chief Complaint:  I am doing well   Virtual Visit via Video Visit  Note . I connected with the patient today by a telephone enabled telemedicine application and verified that I am speaking with the correct person using two identifiers. She is at home. I am in the office.   History of Present Illness:    Jessica [name] is a 28 y.o. female [G1P1001] who returns for follow up of postpartum cardiovascular care.   Medica hx includes POTS.   She has delivered and is doing well. No symptoms.    Prior CV Studies Reviewed: The following studies were reviewed today: Zio monitor,   Past Medical History:  Diagnosis Date   Adjustment disorder with mixed anxiety and depressed mood    Anorexia nervosa (HCC)    Anxiety    Autonomic dysfunction    Depression    Gestational diabetes    Kidney stone    Migraine headache    PCOS (polycystic ovarian syndrome)    POTS (postural orthostatic tachycardia syndrome)    Pregnancy induced hypertension    Splenomegaly    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   Syncope 09/22/2014   UTI (urinary tract infection)    Vasovagal syncope     Past Surgical History:  Procedure Laterality Date   ANTERIOR CRUCIATE LIGAMENT REPAIR Left 03/2010   CESAREAN SECTION N/A 04/08/2024   Procedure: CESAREAN DELIVERY;  Surgeon: Marget Lenis, MD;  Location: [facility] LD ORS;  Service: Obstetrics;  Laterality: N/A;   EYE SURGERY  2000   clogged tear duct   WISDOM TOOTH EXTRACTION  2015      OB History     Gravida  1   Para  1   Term  1   Preterm  0   AB  0   Living  1      SAB  0   IAB  0   Ectopic  0   Multiple  0   Live Births  1               Current  Medications: Current Meds  Medication Sig   acetaminophen  (TYLENOL ) 325 MG tablet Take 2 tablets (650 mg total) by mouth every 4 (four) hours as needed (for pain scale < 4  OR  temperature  >/=  100.5 F).   docusate sodium  (COLACE) 100 MG capsule Take 1 capsule (100 mg total) by mouth 2 (two) times daily.   FLUoxetine  (PROZAC ) 40 MG capsule Take 1 capsule (40 mg total) by mouth daily.   ibuprofen  (ADVIL ) 600 MG tablet Take 1 tablet (600 mg total) by mouth every 6 (six) hours as needed.   Prenatal Vit-Fe Fumarate-FA (PRENATAL MULTIVITAMIN) TABS tablet Take 1 tablet by mouth daily.     Allergies:   Amoxicillin, Doxycycline , and Maxalt  [rizatriptan ]   Social History   Socioeconomic History   Marital status: Married    Spouse name: [name]   Number of children: 0   Years of education: Not on file   Highest education level: Bachelor's degree (e.g., BA, AB, BS)  Occupational History    Comment: RN [employer]  Tobacco Use  Smoking status: Never   Smokeless tobacco: Never  Vaping Use   Vaping status: Never Used  Substance and Sexual Activity   Alcohol use: Not Currently    Comment: occ   Drug use: No   Sexual activity: Not Currently  Other Topics Concern   Not on file  Social History Narrative   RN at [employer]   She lives with husband    caffeine  coffee 2 cups daily   Social Drivers of Health   Financial Resource Strain: Not on file  Food Insecurity: No Food Insecurity (04/08/2024)   Hunger Vital Sign    Worried About Running Out of Food in the Last Year: Never true    Ran Out of Food in the Last Year: Never true  Transportation Needs: No Transportation Needs (04/08/2024)   PRAPARE - Administrator, Civil Service (Medical): No    Lack of Transportation (Non-Medical): No  Physical Activity: Not on file  Stress: Not on file  Social Connections: Not on file      Family History  Problem Relation Age of Onset   Diabetes Mother        pre-diabetic    Migraines Mother        Started 4th or 5th grade   Hypertension Father    Kidney Stones Father    Migraines Maternal Aunt    Breast cancer Maternal Aunt    Cancer Maternal Aunt        Peritoneal CA   Eating disorder Maternal Aunt    Migraines Maternal Uncle    Diabetes Maternal Grandmother    Stroke Maternal Grandfather        mini-stroke   Cancer Maternal Grandfather        Bladder cancer, Died at 82   Diabetes Maternal Grandfather    Mental retardation Other        Maternal Second Cousin   Other Other        Maternal Great Uncle had some sort of Retinal Vessel Occlusion   Depression Other        Father's side of the family   Colon cancer Neg Hx       ROS:   Please see the history of present illness.     All other systems reviewed and are negative.   Labs/EKG Reviewed:    EKG: None today   Recent Labs: 04/18/2024: ALT 23; BUN 11; Creatinine, Ser 0.60; Hemoglobin 12.8; Platelets 440; Potassium 4.2; Sodium 143   Recent Lipid Panel Lab Results  Component Value Date/Time   CHOL 187 (H) 04/29/2015 02:42 PM   TRIG 116 04/29/2015 02:42 PM   HDL 38 04/29/2015 02:42 PM   CHOLHDL 4.9 04/29/2015 02:42 PM   LDLCALC 126 (H) 04/29/2015 02:42 PM    Physical Exam:    VS:  Ht 5' 6 (1.676 m)   Wt 160 lb (72.6 kg)   BMI 25.82 kg/m     Wt Readings from Last 3 Encounters:  05/09/24 160 lb (72.6 kg)  04/18/24 173 lb (78.5 kg)  04/08/24 196 lb 8 oz (89.1 kg)      Risk Assessment/Risk Calculators:                 ASSESSMENT & PLAN:    POTS - doing well from a CV standpoint. No need for medications at this time.  We will see her as needed.    Patient Instructions  Medication Instructions:   No changes    Lab  Work: Not needed    Testing/Procedures:  Not needed  Follow-Up: At [facility], you and your health needs are our priority.  As part of our continuing mission to provide you with exceptional heart care, we have created designated Provider  Care Teams.  These Care Teams include your primary Cardiologist (physician) and Advanced Practice Providers (APPs -  Physician Assistants and Nurse Practitioners) who all work together to provide you with the care you need, when you need it.     Your next appointment:   As needed    The format for your next appointment:   In Person  Provider:   Kristiane Morsch, DO      Dispo:  Return if symptoms worsen or fail to improve.   Medication Adjustments/Labs and Tests Ordered: Current medicines are reviewed at length with the patient today.  Concerns regarding medicines are outlined above.  Tests Ordered: No orders of the defined types were placed in this encounter.  Medication Changes: No orders of the defined types were placed in this encounter.

## 2024-05-09 NOTE — Patient Instructions (Signed)
 Medication Instructions:   No changes    Lab Work: Not needed    Testing/Procedures:  Not needed  Follow-Up: At [facility], you and your health needs are our priority.  As part of our continuing mission to provide you with exceptional heart care, we have created designated Provider Care Teams.  These Care Teams include your primary Cardiologist (physician) and Advanced Practice Providers (APPs -  Physician Assistants and Nurse Practitioners) who all work together to provide you with the care you need, when you need it.     Your next appointment:   As needed    The format for your next appointment:   In Person  Provider:   Kardie Tobb, DO

## 2024-05-15 ENCOUNTER — Other Ambulatory Visit: Payer: Self-pay | Admitting: Obstetrics & Gynecology

## 2024-05-15 DIAGNOSIS — R1010 Upper abdominal pain, unspecified: Secondary | ICD-10-CM

## 2024-05-19 ENCOUNTER — Ambulatory Visit
Admission: RE | Admit: 2024-05-19 | Discharge: 2024-05-19 | Disposition: A | Source: Ambulatory Visit | Attending: Obstetrics & Gynecology | Admitting: Obstetrics & Gynecology

## 2024-05-19 DIAGNOSIS — R1010 Upper abdominal pain, unspecified: Secondary | ICD-10-CM

## 2024-05-19 MED ORDER — IOPAMIDOL (ISOVUE-300) INJECTION 61%
100.0000 mL | Freq: Once | INTRAVENOUS | Status: AC | PRN
  Administered 2024-05-19: 100 mL via INTRAVENOUS

## 2024-06-15 ENCOUNTER — Ambulatory Visit
Admission: RE | Admit: 2024-06-15 | Discharge: 2024-06-15 | Disposition: A | Discharge status: Home / Self Care | Source: Ambulatory Visit | Attending: Emergency Medicine | Admitting: Emergency Medicine

## 2024-06-15 VITALS — BP 117/81 | HR 84 | Temp 98.5°F | Resp 16

## 2024-06-15 DIAGNOSIS — B3789 Other sites of candidiasis: Secondary | ICD-10-CM | POA: Diagnosis not present

## 2024-06-15 MED ORDER — FLUCONAZOLE 100 MG PO TABS: ORAL_TABLET | ORAL | 0 refills | Status: AC
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
## 2024-06-15 NOTE — ED Triage Notes (Signed)
 Pt c/o rash on nipple since Nov 15th. States it started as small spot just on left nipple but has gotten much worse and on both nipples now.   She was given nystatin cream by PCP which hasn't helped.   She is currently breast feeding

## 2024-06-15 NOTE — Discharge Instructions (Signed)
 You have a fungal infection of the breast.  Take 2 pills of the fluconazole  today.  Afterwards take 1 pill daily to treat the fungal infection of the breast.  This can be taken for up to 2 weeks or until the rash resolves.  Continue to keep the breast clean and dry.  Follow-up with your primary care provider or OB/GYN for any continued concerns.
